# Patient Record
Sex: Female | Born: 1970 | Race: White | Hispanic: No | Marital: Married | Smoking: Former smoker
Health system: Southern US, Community
[De-identification: ages and names within clinical notes are randomized; demographics above are authoritative.]

## PROBLEM LIST (undated history)

## (undated) DIAGNOSIS — R000 Tachycardia, unspecified: Secondary | ICD-10-CM

## (undated) DIAGNOSIS — E559 Vitamin D deficiency, unspecified: Secondary | ICD-10-CM

## (undated) DIAGNOSIS — K429 Umbilical hernia without obstruction or gangrene: Secondary | ICD-10-CM

## (undated) DIAGNOSIS — I471 Supraventricular tachycardia, unspecified: Secondary | ICD-10-CM

## (undated) DIAGNOSIS — K3189 Other diseases of stomach and duodenum: Secondary | ICD-10-CM

## (undated) DIAGNOSIS — F329 Major depressive disorder, single episode, unspecified: Secondary | ICD-10-CM

## (undated) DIAGNOSIS — R7303 Prediabetes: Secondary | ICD-10-CM

## (undated) DIAGNOSIS — K219 Gastro-esophageal reflux disease without esophagitis: Secondary | ICD-10-CM

## (undated) DIAGNOSIS — R7302 Impaired glucose tolerance (oral): Secondary | ICD-10-CM

## (undated) DIAGNOSIS — J45909 Unspecified asthma, uncomplicated: Secondary | ICD-10-CM

## (undated) DIAGNOSIS — I10 Essential (primary) hypertension: Secondary | ICD-10-CM

## (undated) DIAGNOSIS — F3289 Other specified depressive episodes: Secondary | ICD-10-CM

## (undated) DIAGNOSIS — E538 Deficiency of other specified B group vitamins: Secondary | ICD-10-CM

## (undated) DIAGNOSIS — R0602 Shortness of breath: Secondary | ICD-10-CM

## (undated) DIAGNOSIS — E282 Polycystic ovarian syndrome: Secondary | ICD-10-CM

## (undated) DIAGNOSIS — G4733 Obstructive sleep apnea (adult) (pediatric): Secondary | ICD-10-CM

## (undated) DIAGNOSIS — E669 Obesity, unspecified: Secondary | ICD-10-CM

## (undated) DIAGNOSIS — R1013 Epigastric pain: Secondary | ICD-10-CM

## (undated) DIAGNOSIS — F17200 Nicotine dependence, unspecified, uncomplicated: Secondary | ICD-10-CM

## (undated) DIAGNOSIS — F419 Anxiety disorder, unspecified: Secondary | ICD-10-CM

## (undated) HISTORY — DX: Other diseases of stomach and duodenum: K31.89

## (undated) HISTORY — PX: DILATION AND CURETTAGE OF UTERUS: SHX78

## (undated) HISTORY — DX: Major depressive disorder, single episode, unspecified: F32.9

## (undated) HISTORY — DX: Unspecified asthma, uncomplicated: J45.909

## (undated) HISTORY — DX: Anxiety disorder, unspecified: F41.9

## (undated) HISTORY — DX: Obstructive sleep apnea (adult) (pediatric): G47.33

## (undated) HISTORY — DX: Obesity, unspecified: E66.9

## (undated) HISTORY — DX: Gastro-esophageal reflux disease without esophagitis: K21.9

## (undated) HISTORY — DX: Other specified depressive episodes: F32.89

## (undated) HISTORY — DX: Deficiency of other specified B group vitamins: E53.8

## (undated) HISTORY — DX: Prediabetes: R73.03

## (undated) HISTORY — DX: Impaired glucose tolerance (oral): R73.02

## (undated) HISTORY — DX: Epigastric pain: R10.13

## (undated) HISTORY — DX: Vitamin D deficiency, unspecified: E55.9

## (undated) HISTORY — DX: Nicotine dependence, unspecified, uncomplicated: F17.200

## (undated) HISTORY — DX: Essential (primary) hypertension: I10

## (undated) HISTORY — DX: Shortness of breath: R06.02

## (undated) HISTORY — DX: Polycystic ovarian syndrome: E28.2

## (undated) HISTORY — DX: Supraventricular tachycardia: I47.1

## (undated) HISTORY — DX: Umbilical hernia without obstruction or gangrene: K42.9

## (undated) HISTORY — PX: LAPAROSCOPY: SHX197

## (undated) HISTORY — DX: Supraventricular tachycardia, unspecified: I47.10

---

## 1993-04-11 HISTORY — PX: OTHER SURGICAL HISTORY: SHX169

## 1996-04-11 HISTORY — PX: TUBAL LIGATION: SHX77

## 1999-10-07 ENCOUNTER — Other Ambulatory Visit: Admission: RE | Admit: 1999-10-07 | Discharge: 1999-10-07 | Payer: Self-pay | Admitting: Family Medicine

## 2002-08-31 ENCOUNTER — Encounter: Payer: Self-pay | Admitting: Internal Medicine

## 2002-09-01 ENCOUNTER — Observation Stay (HOSPITAL_COMMUNITY): Admission: EM | Admit: 2002-09-01 | Discharge: 2002-09-01 | Payer: Self-pay | Admitting: Internal Medicine

## 2003-06-18 ENCOUNTER — Ambulatory Visit (HOSPITAL_COMMUNITY): Admission: RE | Admit: 2003-06-18 | Discharge: 2003-06-18 | Payer: Self-pay | Admitting: Unknown Physician Specialty

## 2003-12-19 ENCOUNTER — Ambulatory Visit (HOSPITAL_COMMUNITY): Admission: RE | Admit: 2003-12-19 | Discharge: 2003-12-19 | Payer: Self-pay | Admitting: Family Medicine

## 2004-03-11 ENCOUNTER — Ambulatory Visit: Payer: Self-pay | Admitting: Family Medicine

## 2004-03-24 ENCOUNTER — Ambulatory Visit: Payer: Self-pay | Admitting: Family Medicine

## 2004-04-28 ENCOUNTER — Encounter (INDEPENDENT_AMBULATORY_CARE_PROVIDER_SITE_OTHER): Payer: Self-pay | Admitting: *Deleted

## 2004-04-28 ENCOUNTER — Ambulatory Visit (HOSPITAL_COMMUNITY): Admission: RE | Admit: 2004-04-28 | Discharge: 2004-04-28 | Payer: Self-pay | Admitting: Gastroenterology

## 2004-04-28 ENCOUNTER — Encounter: Payer: Self-pay | Admitting: Internal Medicine

## 2004-05-11 ENCOUNTER — Ambulatory Visit: Payer: Self-pay | Admitting: Family Medicine

## 2004-07-08 ENCOUNTER — Ambulatory Visit: Payer: Self-pay | Admitting: Family Medicine

## 2004-07-19 ENCOUNTER — Encounter (INDEPENDENT_AMBULATORY_CARE_PROVIDER_SITE_OTHER): Payer: Self-pay | Admitting: *Deleted

## 2004-07-19 ENCOUNTER — Encounter: Payer: Self-pay | Admitting: Internal Medicine

## 2004-07-19 ENCOUNTER — Ambulatory Visit (HOSPITAL_COMMUNITY): Admission: RE | Admit: 2004-07-19 | Discharge: 2004-07-19 | Payer: Self-pay | Admitting: Gastroenterology

## 2004-09-24 ENCOUNTER — Encounter: Payer: Self-pay | Admitting: Internal Medicine

## 2004-09-24 ENCOUNTER — Encounter: Admission: RE | Admit: 2004-09-24 | Discharge: 2004-09-24 | Payer: Self-pay | Admitting: Gastroenterology

## 2004-09-24 ENCOUNTER — Encounter (INDEPENDENT_AMBULATORY_CARE_PROVIDER_SITE_OTHER): Payer: Self-pay | Admitting: *Deleted

## 2004-10-08 ENCOUNTER — Encounter: Payer: Self-pay | Admitting: Internal Medicine

## 2004-10-08 ENCOUNTER — Encounter: Admission: RE | Admit: 2004-10-08 | Discharge: 2004-10-08 | Payer: Self-pay | Admitting: Gastroenterology

## 2004-10-08 ENCOUNTER — Encounter (INDEPENDENT_AMBULATORY_CARE_PROVIDER_SITE_OTHER): Payer: Self-pay | Admitting: *Deleted

## 2004-12-08 ENCOUNTER — Ambulatory Visit: Payer: Self-pay | Admitting: Family Medicine

## 2004-12-08 ENCOUNTER — Ambulatory Visit (HOSPITAL_COMMUNITY): Admission: RE | Admit: 2004-12-08 | Discharge: 2004-12-08 | Payer: Self-pay | Admitting: Family Medicine

## 2004-12-16 ENCOUNTER — Other Ambulatory Visit: Admission: RE | Admit: 2004-12-16 | Discharge: 2004-12-16 | Payer: Self-pay | Admitting: Obstetrics and Gynecology

## 2005-05-25 ENCOUNTER — Ambulatory Visit: Payer: Self-pay | Admitting: Urgent Care

## 2005-06-02 ENCOUNTER — Ambulatory Visit (HOSPITAL_COMMUNITY): Admission: RE | Admit: 2005-06-02 | Discharge: 2005-06-02 | Payer: Self-pay | Admitting: Internal Medicine

## 2005-06-13 ENCOUNTER — Encounter (INDEPENDENT_AMBULATORY_CARE_PROVIDER_SITE_OTHER): Payer: Self-pay | Admitting: *Deleted

## 2005-06-13 ENCOUNTER — Encounter (HOSPITAL_COMMUNITY): Admission: RE | Admit: 2005-06-13 | Discharge: 2005-07-13 | Payer: Self-pay | Admitting: Internal Medicine

## 2005-06-30 ENCOUNTER — Ambulatory Visit: Payer: Self-pay | Admitting: Internal Medicine

## 2005-07-12 ENCOUNTER — Ambulatory Visit (HOSPITAL_BASED_OUTPATIENT_CLINIC_OR_DEPARTMENT_OTHER): Admission: RE | Admit: 2005-07-12 | Discharge: 2005-07-12 | Payer: Self-pay | Admitting: Obstetrics and Gynecology

## 2005-07-17 ENCOUNTER — Ambulatory Visit: Payer: Self-pay | Admitting: Internal Medicine

## 2005-10-11 ENCOUNTER — Emergency Department (HOSPITAL_COMMUNITY): Admission: EM | Admit: 2005-10-11 | Discharge: 2005-10-11 | Payer: Self-pay | Admitting: Emergency Medicine

## 2006-02-09 ENCOUNTER — Ambulatory Visit (HOSPITAL_COMMUNITY): Admission: RE | Admit: 2006-02-09 | Discharge: 2006-02-09 | Payer: Self-pay | Admitting: Obstetrics and Gynecology

## 2006-02-24 ENCOUNTER — Ambulatory Visit: Payer: Self-pay | Admitting: Pulmonary Disease

## 2006-03-14 ENCOUNTER — Ambulatory Visit: Payer: Self-pay | Admitting: Pulmonary Disease

## 2006-04-07 ENCOUNTER — Ambulatory Visit: Payer: Self-pay | Admitting: Pulmonary Disease

## 2006-04-21 ENCOUNTER — Ambulatory Visit: Payer: Self-pay | Admitting: Pulmonary Disease

## 2006-04-21 ENCOUNTER — Ambulatory Visit (HOSPITAL_BASED_OUTPATIENT_CLINIC_OR_DEPARTMENT_OTHER): Admission: RE | Admit: 2006-04-21 | Discharge: 2006-04-21 | Payer: Self-pay | Admitting: Pulmonary Disease

## 2006-06-26 ENCOUNTER — Ambulatory Visit: Payer: Self-pay | Admitting: Pulmonary Disease

## 2006-06-29 ENCOUNTER — Ambulatory Visit: Payer: Self-pay | Admitting: Cardiology

## 2006-07-20 ENCOUNTER — Ambulatory Visit: Payer: Self-pay

## 2006-07-25 ENCOUNTER — Ambulatory Visit: Payer: Self-pay

## 2006-07-25 ENCOUNTER — Encounter: Payer: Self-pay | Admitting: Cardiovascular Disease

## 2007-02-10 DIAGNOSIS — Z6841 Body Mass Index (BMI) 40.0 and over, adult: Secondary | ICD-10-CM

## 2007-02-10 DIAGNOSIS — J45991 Cough variant asthma: Secondary | ICD-10-CM | POA: Insufficient documentation

## 2007-02-10 DIAGNOSIS — E282 Polycystic ovarian syndrome: Secondary | ICD-10-CM | POA: Insufficient documentation

## 2007-02-10 DIAGNOSIS — K30 Functional dyspepsia: Secondary | ICD-10-CM | POA: Insufficient documentation

## 2007-02-10 DIAGNOSIS — G4733 Obstructive sleep apnea (adult) (pediatric): Secondary | ICD-10-CM | POA: Insufficient documentation

## 2007-06-20 ENCOUNTER — Encounter (INDEPENDENT_AMBULATORY_CARE_PROVIDER_SITE_OTHER): Payer: Self-pay | Admitting: Obstetrics and Gynecology

## 2007-06-20 ENCOUNTER — Ambulatory Visit (HOSPITAL_COMMUNITY): Admission: RE | Admit: 2007-06-20 | Discharge: 2007-06-20 | Payer: Self-pay | Admitting: Obstetrics and Gynecology

## 2007-07-19 ENCOUNTER — Ambulatory Visit (HOSPITAL_COMMUNITY): Admission: RE | Admit: 2007-07-19 | Discharge: 2007-07-19 | Payer: Self-pay | Admitting: Obstetrics and Gynecology

## 2007-07-19 ENCOUNTER — Encounter (INDEPENDENT_AMBULATORY_CARE_PROVIDER_SITE_OTHER): Payer: Self-pay | Admitting: *Deleted

## 2007-11-08 ENCOUNTER — Emergency Department (HOSPITAL_COMMUNITY): Admission: EM | Admit: 2007-11-08 | Discharge: 2007-11-08 | Payer: Self-pay | Admitting: Emergency Medicine

## 2007-11-15 ENCOUNTER — Emergency Department (HOSPITAL_COMMUNITY): Admission: EM | Admit: 2007-11-15 | Discharge: 2007-11-15 | Payer: Self-pay | Admitting: Emergency Medicine

## 2008-01-04 ENCOUNTER — Emergency Department (HOSPITAL_COMMUNITY): Admission: EM | Admit: 2008-01-04 | Discharge: 2008-01-04 | Payer: Self-pay | Admitting: Emergency Medicine

## 2008-03-04 ENCOUNTER — Ambulatory Visit: Payer: Self-pay | Admitting: Internal Medicine

## 2008-03-11 ENCOUNTER — Ambulatory Visit (HOSPITAL_COMMUNITY): Admission: RE | Admit: 2008-03-11 | Discharge: 2008-03-11 | Payer: Self-pay | Admitting: Internal Medicine

## 2008-04-11 ENCOUNTER — Encounter: Payer: Self-pay | Admitting: Internal Medicine

## 2008-04-11 LAB — CONVERTED CEMR LAB: Pap Smear: NORMAL

## 2008-04-23 ENCOUNTER — Telehealth: Payer: Self-pay | Admitting: Pulmonary Disease

## 2008-05-01 ENCOUNTER — Ambulatory Visit: Payer: Self-pay | Admitting: Cardiology

## 2008-05-01 LAB — CONVERTED CEMR LAB
BUN: 12 mg/dL (ref 6–23)
Basophils Absolute: 0.1 10*3/uL (ref 0.0–0.1)
Basophils Relative: 0.9 % (ref 0.0–3.0)
CO2: 29 meq/L (ref 19–32)
Calcium: 9.2 mg/dL (ref 8.4–10.5)
Chloride: 105 meq/L (ref 96–112)
Creatinine, Ser: 0.8 mg/dL (ref 0.4–1.2)
Eosinophils Absolute: 0.1 10*3/uL (ref 0.0–0.7)
Eosinophils Relative: 1.4 % (ref 0.0–5.0)
GFR calc Af Amer: 104 mL/min
GFR calc non Af Amer: 86 mL/min
Glucose, Bld: 79 mg/dL (ref 70–99)
HCT: 37.6 % (ref 36.0–46.0)
Hemoglobin: 12.5 g/dL (ref 12.0–15.0)
Lymphocytes Relative: 29.4 % (ref 12.0–46.0)
MCHC: 33.4 g/dL (ref 30.0–36.0)
MCV: 90.5 fL (ref 78.0–100.0)
Magnesium: 1.8 mg/dL (ref 1.5–2.5)
Monocytes Absolute: 0.4 10*3/uL (ref 0.1–1.0)
Monocytes Relative: 4.4 % (ref 3.0–12.0)
Neutro Abs: 5.9 10*3/uL (ref 1.4–7.7)
Neutrophils Relative %: 63.9 % (ref 43.0–77.0)
Platelets: 304 10*3/uL (ref 150–400)
Potassium: 4 meq/L (ref 3.5–5.1)
RBC: 4.15 M/uL (ref 3.87–5.11)
RDW: 12.4 % (ref 11.5–14.6)
Sodium: 139 meq/L (ref 135–145)
TSH: 2.91 microintl units/mL (ref 0.35–5.50)
WBC: 9.2 10*3/uL (ref 4.5–10.5)

## 2008-05-07 ENCOUNTER — Encounter: Payer: Self-pay | Admitting: Cardiology

## 2008-05-07 ENCOUNTER — Ambulatory Visit: Payer: Self-pay

## 2008-05-19 ENCOUNTER — Encounter: Payer: Self-pay | Admitting: Internal Medicine

## 2008-05-21 ENCOUNTER — Telehealth: Payer: Self-pay | Admitting: Internal Medicine

## 2008-05-29 ENCOUNTER — Ambulatory Visit: Payer: Self-pay | Admitting: Cardiology

## 2008-06-05 ENCOUNTER — Ambulatory Visit: Payer: Self-pay | Admitting: Internal Medicine

## 2008-06-06 LAB — CONVERTED CEMR LAB
ALT: 14 units/L (ref 0–35)
AST: 10 units/L (ref 0–37)
Albumin: 3.9 g/dL (ref 3.5–5.2)
Alkaline Phosphatase: 85 units/L (ref 39–117)
BUN: 12 mg/dL (ref 6–23)
Basophils Absolute: 0.1 10*3/uL (ref 0.0–0.1)
Basophils Relative: 1.3 % (ref 0.0–3.0)
CO2: 29 meq/L (ref 19–32)
Calcium: 9 mg/dL (ref 8.4–10.5)
Chloride: 104 meq/L (ref 96–112)
Creatinine, Ser: 0.8 mg/dL (ref 0.4–1.2)
Eosinophils Absolute: 0.2 10*3/uL (ref 0.0–0.7)
Eosinophils Relative: 1.6 % (ref 0.0–5.0)
GFR calc Af Amer: 104 mL/min
GFR calc non Af Amer: 86 mL/min
Glucose, Bld: 93 mg/dL (ref 70–99)
HCT: 37.7 % (ref 36.0–46.0)
Hemoglobin: 12.9 g/dL (ref 12.0–15.0)
Lymphocytes Relative: 32 % (ref 12.0–46.0)
MCHC: 34.3 g/dL (ref 30.0–36.0)
MCV: 89.3 fL (ref 78.0–100.0)
Monocytes Absolute: 0.4 10*3/uL (ref 0.1–1.0)
Monocytes Relative: 4 % (ref 3.0–12.0)
Neutro Abs: 6.2 10*3/uL (ref 1.4–7.7)
Neutrophils Relative %: 61.1 % (ref 43.0–77.0)
Platelets: 321 10*3/uL (ref 150–400)
Potassium: 3.9 meq/L (ref 3.5–5.1)
RBC: 4.22 M/uL (ref 3.87–5.11)
RDW: 12.2 % (ref 11.5–14.6)
Sed Rate: 32 mm/hr — ABNORMAL HIGH (ref 0–22)
Sodium: 139 meq/L (ref 135–145)
TSH: 2.37 microintl units/mL (ref 0.35–5.50)
Total Bilirubin: 0.7 mg/dL (ref 0.3–1.2)
Total Protein: 7.3 g/dL (ref 6.0–8.3)
WBC: 10.2 10*3/uL (ref 4.5–10.5)

## 2008-06-23 ENCOUNTER — Encounter: Payer: Self-pay | Admitting: Pulmonary Disease

## 2008-06-23 ENCOUNTER — Telehealth: Payer: Self-pay | Admitting: Internal Medicine

## 2008-07-24 ENCOUNTER — Ambulatory Visit: Payer: Self-pay | Admitting: Internal Medicine

## 2008-07-24 ENCOUNTER — Encounter: Payer: Self-pay | Admitting: Internal Medicine

## 2008-07-24 LAB — HM COLONOSCOPY

## 2008-07-26 ENCOUNTER — Encounter: Payer: Self-pay | Admitting: Internal Medicine

## 2008-09-19 ENCOUNTER — Encounter (INDEPENDENT_AMBULATORY_CARE_PROVIDER_SITE_OTHER): Payer: Self-pay | Admitting: *Deleted

## 2008-10-15 ENCOUNTER — Telehealth: Payer: Self-pay | Admitting: Internal Medicine

## 2008-12-05 ENCOUNTER — Ambulatory Visit: Payer: Self-pay | Admitting: Internal Medicine

## 2009-01-28 ENCOUNTER — Ambulatory Visit: Payer: Self-pay | Admitting: Internal Medicine

## 2009-01-28 ENCOUNTER — Encounter: Payer: Self-pay | Admitting: Internal Medicine

## 2009-01-28 DIAGNOSIS — F329 Major depressive disorder, single episode, unspecified: Secondary | ICD-10-CM | POA: Insufficient documentation

## 2009-01-28 DIAGNOSIS — K219 Gastro-esophageal reflux disease without esophagitis: Secondary | ICD-10-CM | POA: Insufficient documentation

## 2009-01-28 DIAGNOSIS — F17210 Nicotine dependence, cigarettes, uncomplicated: Secondary | ICD-10-CM | POA: Insufficient documentation

## 2009-01-28 DIAGNOSIS — F32A Depression, unspecified: Secondary | ICD-10-CM | POA: Insufficient documentation

## 2009-01-28 DIAGNOSIS — B009 Herpesviral infection, unspecified: Secondary | ICD-10-CM | POA: Insufficient documentation

## 2009-05-23 ENCOUNTER — Emergency Department (HOSPITAL_COMMUNITY): Admission: EM | Admit: 2009-05-23 | Discharge: 2009-05-24 | Payer: Self-pay | Admitting: Emergency Medicine

## 2009-05-24 ENCOUNTER — Encounter: Payer: Self-pay | Admitting: Internal Medicine

## 2009-05-25 ENCOUNTER — Ambulatory Visit: Payer: Self-pay | Admitting: Internal Medicine

## 2009-05-25 ENCOUNTER — Telehealth: Payer: Self-pay | Admitting: Internal Medicine

## 2009-05-25 DIAGNOSIS — J069 Acute upper respiratory infection, unspecified: Secondary | ICD-10-CM | POA: Insufficient documentation

## 2009-08-04 ENCOUNTER — Ambulatory Visit: Payer: Self-pay | Admitting: Internal Medicine

## 2009-08-04 LAB — CONVERTED CEMR LAB
ALT: 17 units/L (ref 0–35)
AST: 12 units/L (ref 0–37)
Albumin: 3.7 g/dL (ref 3.5–5.2)
Alkaline Phosphatase: 72 units/L (ref 39–117)
BUN: 11 mg/dL (ref 6–23)
Basophils Absolute: 0 10*3/uL (ref 0.0–0.1)
Basophils Relative: 0.5 % (ref 0.0–3.0)
Bilirubin Urine: NEGATIVE
Bilirubin, Direct: 0.1 mg/dL (ref 0.0–0.3)
CO2: 27 meq/L (ref 19–32)
Calcium: 8.7 mg/dL (ref 8.4–10.5)
Chloride: 109 meq/L (ref 96–112)
Cholesterol: 215 mg/dL — ABNORMAL HIGH (ref 0–200)
Creatinine, Ser: 0.8 mg/dL (ref 0.4–1.2)
Direct LDL: 161.1 mg/dL
Eosinophils Absolute: 0.1 10*3/uL (ref 0.0–0.7)
Eosinophils Relative: 1.2 % (ref 0.0–5.0)
GFR calc non Af Amer: 85.51 mL/min (ref >60)
Glucose, Bld: 80 mg/dL (ref 70–99)
HCT: 35 % — ABNORMAL LOW (ref 36.0–46.0)
HDL: 41.8 mg/dL (ref >39.00)
Hemoglobin, Urine: NEGATIVE
Hemoglobin: 11.6 g/dL — ABNORMAL LOW (ref 12.0–15.0)
Ketones, ur: NEGATIVE mg/dL
Leukocytes, UA: NEGATIVE
Lymphocytes Relative: 30.7 % (ref 12.0–46.0)
Lymphs Abs: 2.6 10*3/uL (ref 0.7–4.0)
MCHC: 33.2 g/dL (ref 30.0–36.0)
MCV: 91.8 fL (ref 78.0–100.0)
Monocytes Absolute: 0.4 10*3/uL (ref 0.1–1.0)
Monocytes Relative: 5 % (ref 3.0–12.0)
Neutro Abs: 5.4 10*3/uL (ref 1.4–7.7)
Neutrophils Relative %: 62.6 % (ref 43.0–77.0)
Nitrite: NEGATIVE
Platelets: 276 10*3/uL (ref 150.0–400.0)
Potassium: 4.3 meq/L (ref 3.5–5.1)
RBC: 3.82 M/uL — ABNORMAL LOW (ref 3.87–5.11)
RDW: 13.6 % (ref 11.5–14.6)
Sodium: 141 meq/L (ref 135–145)
Specific Gravity, Urine: 1.025 (ref 1.000–1.030)
TSH: 2.27 microintl units/mL (ref 0.35–5.50)
Total Bilirubin: 0.5 mg/dL (ref 0.3–1.2)
Total CHOL/HDL Ratio: 5
Total Protein, Urine: NEGATIVE mg/dL
Total Protein: 6.4 g/dL (ref 6.0–8.3)
Triglycerides: 85 mg/dL (ref 0.0–149.0)
Urine Glucose: NEGATIVE mg/dL
Urobilinogen, UA: 0.2 (ref 0.0–1.0)
VLDL: 17 mg/dL (ref 0.0–40.0)
WBC: 8.6 10*3/uL (ref 4.5–10.5)
pH: 7 (ref 5.0–8.0)

## 2009-08-06 ENCOUNTER — Ambulatory Visit: Payer: Self-pay | Admitting: Internal Medicine

## 2009-09-10 ENCOUNTER — Telehealth: Payer: Self-pay | Admitting: Internal Medicine

## 2009-09-30 ENCOUNTER — Encounter: Payer: Self-pay | Admitting: Internal Medicine

## 2009-12-16 ENCOUNTER — Ambulatory Visit: Payer: Self-pay | Admitting: Internal Medicine

## 2009-12-25 ENCOUNTER — Encounter: Payer: Self-pay | Admitting: Internal Medicine

## 2009-12-25 LAB — HM MAMMOGRAPHY

## 2010-01-20 ENCOUNTER — Ambulatory Visit: Payer: Self-pay | Admitting: Internal Medicine

## 2010-01-20 DIAGNOSIS — H9209 Otalgia, unspecified ear: Secondary | ICD-10-CM | POA: Insufficient documentation

## 2010-01-27 ENCOUNTER — Ambulatory Visit: Payer: Self-pay | Admitting: Internal Medicine

## 2010-04-21 ENCOUNTER — Ambulatory Visit: Admit: 2010-04-21 | Payer: Self-pay | Admitting: Internal Medicine

## 2010-05-13 NOTE — Procedures (Signed)
Summary: 530.81, 787.91/dn

## 2010-05-13 NOTE — Op Note (Signed)
Summary: EGD  NAME:  [name], [name]                  ACCOUNT NO.:  [id number]   MEDICAL RECORD NO.:  [id number]          PATIENT TYPE:  AMB   LOCATION:  ENDO                         FACILITY:  [facility]   PHYSICIAN:  Graylin Shiver, M.D.   DATE OF BIRTH:  April 13, 1970   DATE OF PROCEDURE:  DATE OF DISCHARGE:                                 OPERATIVE REPORT   PROCEDURE:  Esophagogastroduodenoscopy with biopsy for CLO-test.   INDICATION:  Upper abdominal pain etiology unclear. The patient does have an  abnormal gallbladder ejection fraction.  Upper endoscopy is being done to  determine if there is any abnormality in the upper GI tract which might  explain her abdominal pain and associated nausea and vomiting.   Informed consent was obtained after explanation of the risks of bleeding,  infection and perforation.   PREMEDICATION:  Fentanyl 80 mcg IV, Versed 10 mg IV.   PROCEDURE:  With the patient in the left lateral decubitus position the  Olympus gastroscope was inserted into the oropharynx and passed into the  esophagus it was advanced down the esophagus, then into the stomach, and  into the duodenum. The second portion of the duodenum looked normal. The  bulb of the duodenum was erythematous compatible with duodenitis.  The  stomach showed a diffuse erythematous appearance to the mucosa compatible  with gastritis. Biopsy for CLO-test was obtained. No lesions were seen in  the fundus or cardia on retroflexion.  The esophagus looked normal. The  esophagogastric junction was at 38 cm. She tolerated the procedure well  without complications.   IMPRESSION:  1.  Gastritis and duodenitis.  2.  Biopsy for CLO-test was obtained.   PLAN:  1.  Check the CLO-test to look for evidence of Helicobacter pylori.  2.  Continue Prevacid 30 mg daily, to see if this helps.  3.  The patient was given a prescription for Bentyl 20 mg p.o. 1/2 hour      before meals t.i.d. to see if this helps.  4.  I will  follow her up in the office in a month to see how she is doing      clinically       SFG/MEDQ  D:  04/28/2004  T:  04/28/2004  Job:  [id number]   cc:   Lorne Skeens. Hoxworth, M.D.  [address], [address]  [place]  [place] [address]  Fax: [phone]   Delaney Meigs, M.D.  [address]  [place]  [place] [address]  Fax: [phone]

## 2010-05-13 NOTE — Procedures (Signed)
Summary: Colonoscopy   Colonoscopy  Procedure date:  07/24/2008  Findings:      Location:  [facility].    COLONOSCOPY PROCEDURE REPORT  PATIENT:  [name], [name]  MR#:  [id number] BIRTHDATE:   1970-09-02, 37 yrs. old   GENDER:   female  ENDOSCOPIST:   Hedwig Morton. Juanda Chance, MD Referred by: Carrington Clamp, M.D.  PROCEDURE DATE:  07/24/2008 PROCEDURE:  Colonoscopy with biopsy ASA CLASS:   Class II INDICATIONS: change in bowel habits, unexplained diarrhea   MEDICATIONS:    Versed 5 mg, Fentanyl 25 mcg  DESCRIPTION OF PROCEDURE:   After the risks benefits and alternatives of the procedure were thoroughly explained, informed consent was obtained.  Digital rectal exam was performed and revealed no rectal masses.   The LB [id number] endoscope was introduced through the anus and advanced to the cecum, which was identified by both the appendix and ileocecal valve, without limitations.  The quality of the prep was good, using MiraLax.  The instrument was then slowly withdrawn as the colon was fully examined. <<PROCEDUREIMAGES>>      <<OLD IMAGES>>  FINDINGS:  Mild diverticulosis was found sigmoid to descending  This was otherwise a normal examination of the colon. With standard forceps, biopsy was obtained and sent to pathology (see image4, image3, image2, and image1).   Retroflexed views in the rectum revealed no abnormalities.    The scope was then withdrawn from the patient and the procedure completed.  COMPLICATIONS:   None  ENDOSCOPIC IMPRESSION:  1) Mild diverticulosis in the sigmoid to descending  2) Otherwise normal examination  s/p random biopsies of the colon to r/o microscopic colitis RECOMMENDATIONS:  1) hemoccults q 2-3 years  2) await biopsy results  3) high fiber diet  REPEAT EXAM:   In 0 year(s) for.   _______________________________ Hedwig Morton. Juanda Chance, MD  CC: Carrington Clamp, MD    REPORT OF SURGICAL PATHOLOGY   Case #: [phone] Patient  Name: [name], [name]. Office Chart Number:  [id number]   MRN: [id number] Pathologist: Beulah Gandy. Luisa Hart, MD DOB/Age  [birth year] (Age: 68)    Gender: F Date Taken:  07/24/2008 Date Received: 07/24/2008   FINAL DIAGNOSIS   ***MICROSCOPIC EXAMINATION AND DIAGNOSIS***   1.  GASTROESOPHAGEAL JUNCTION, BIOPSIES:  BENIGN GASTROESOPHAGEAL JUNCTION MUCOSA.  NO INTESTINAL METAPLASIA, DYSPLASIA OR MALIGNANCY IDENTIFIED.    2.  COLON, RANDOM BIOPSIES:  BENIGN COLONIC MUCOSA.  NO ACTIVE INFLAMMATION, MICROSCOPIC COLITIS OR GRANULOMAS.    COMMENT 1.  An Alcian Blue stain is performed to determine the presence of intestinal metaplasia (goblet cell metaplasia). No intestinal metaplasia (goblet cell metaplasia) is identified with the Alcian Blue stain. The control stained appropriately.   2.  There is colorectal mucosa with normal crypt architecture and no objective increase in inflammation.  No active inflammation, microscopic colitis, collagenous colitis or significant chronic changes identified. No hyperplastic or adenomatous changes are seen, and there is no evidence of malignancy. (JDP:mj 07/25/08)   mj Date Reported:  07/25/2008     Beulah Gandy. Luisa Hart, MD *** Electronically Signed Out By JDP ***     July 26, 2008 MRN: [id number]    [name] [address] [place], [place]  [address]    Dear [name],  I am pleased to inform you that the biopsies taken during your recent colonoscopy did not show any evidence of cancer upon pathologic examination.The biopsies from Your stomach as well as from Your colon show normal tissue, No evidence of colitis or  ulcer.  Additional information/recommendations:  __No further action is needed at this time.  Please follow-up with      your primary care physician for your other healthcare needs.  __Please call [phone] to schedule a return visit to review      your condition.  _x_Continue with the treatment plan as outlined on the day of your       exam.    Please call us if you are having persistent problems or have questions about your condition that have not been fully answered at this time.  Sincerely,  [name]   This letter has been electronically signed by your physician.  This report was created from the original endoscopy report, which was reviewed and signed by the above listed endoscopist.

## 2010-05-13 NOTE — Letter (Signed)
Summary: No Show/Nutri-Dbs-Mgmt  No Show/Nutri-Dbs-Mgmt   Imported By: Lester Craighead 10/02/2009 08:18:24  _____________________________________________________________________  External Attachment:    Type:   Image     Comment:   External Document

## 2010-05-13 NOTE — Progress Notes (Signed)
Summary: Xanax refill  Phone Note Call from Patient Call back at Work Phone [phone]   Call For: Dr Juanda Chance Reason for Call: Refill Medication Summary of Call: [facility] and she is wondering if the DR is going to ok it. [facility] [place]. Its for [facility]ax  Initial call taken by: Leanor Kail [facility],  May 21, 2008 11:53 AM  Follow-up for Phone Call        Patient advised that we sent xanax rx on 05/19/08 to patients pharmacy.  After calling pharmacy, pharmacist stated that the rx has already been filled and is waiting for patient to pick up.  Patient has been advised that we will give no further refills of xanax and that she should get future xanax rx from primary care doctor.  Patient says she has no primary care dr and would like to know who to go to.  I have recommended Dr Yetta Barre at [facility]. Follow-up by: Hortense Ramal CMA,  May 21, 2008 12:10 PM

## 2010-05-13 NOTE — Assessment & Plan Note (Signed)
Summary: NAUSEA/PN/FH   History of Present Illness Visit Type: consult Primary GI MD: Lina Sar MD Requesting Khaliel Morey: Carrington Clamp, MD Chief Complaint: Abdominal pain, Nausea/Vomiting, Chest Pain History of Present Illness:   This is a 40 year old white female with chronic digestive problems and multiple symptoms pertaining to her chest and abdomen. Her symptoms started in 09-10-96 when her first husband died of cancer and she has was left with 2  infant children. She since then has remarried and is quite happy but is having a lot of stress. She had a GI evaluation by Dr. Evette Cristal in 2004/09/10 she had an abnormal HIDA scan at [facility] in 09-11-2002 as well as an upper endoscopy in 2004/09/10 which showed mild gastritis and duodenitis. She was on Prevacid 30 mg a day at that time but has discontinued it because of the cost. She has been overweight. Her sister was diagnosed with gallstones but has not had her gallbladder removed. Her other medical problems include obesity, polycystic ovary  treated by Dr. 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 Her upper abdominal ultrasound in April 2009 was normal. She complains of morning nausea and vomiting.She has an upcoming court appearance which makes her anxious.   GI Review of Systems    Reports abdominal pain, acid reflux, chest pain, loss of appetite, nausea, vomiting, and  weight loss.     Location of  Abdominal pain: generalized. Weight loss of 11 pounds over 6 weeks.   Denies belching, bloating, dysphagia with liquids, dysphagia with solids, heartburn, vomiting blood, and  weight gain.      Reports diverticulosis.     Denies anal fissure, black tarry stools, change in bowel habit, constipation, diarrhea, fecal incontinence, heme positive stool, hemorrhoids, irritable bowel syndrome, jaundice, light color stool, liver problems, rectal bleeding, and  rectal pain.     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)       OBSTRUCTIVE SLEEP APNEA (ICD-327.23)       ASTHMA (ICD-493.90)       Diverticulosis       Ulcerative Colitis       Urinary Tract Infection  Past Surgical History:    Reviewed history from 03/03/2008 and no changes required:       c-section x 2       tubal ligation   Family History:    Reviewed history from 03/03/2008 and no changes required:       Family History of Heart Disease: Maternal Grandmother       Family History of Pancreatic Cancer:Maternal Grandmother (Diagnosed age 33)       Family History of Prostate Cancer: Maternal Grandfather (diagnosed in 74's)       Family History of Colon Polyps: Maternal Grandfather       Family History of Diabetes: Maternal Aunt  Social History:    Reviewed history from 03/03/2008 and  no changes required:       Alcohol Use - yes-rare       Illicit Drug Use - no       Daily Caffeine Use       Patient currently smokes.    Risk Factors:  Tobacco use:  current   Review of Systems       The patient complains of back pain, cough, fatigue, fever, sore throat, and swollen lymph glands.         Pertinent positive and negative review of systems were noted in the above HPI. All other ROS was otherwise negative.    Vital Signs:  Patient Profile:   40 Years Old Female Height:     65 inches Weight:      276 pounds BMI:     46.09 BSA:     2.27 Pulse rate:   76 / minute Pulse rhythm:   regular BP sitting:   108 / 80  (left  arm)  Vitals Entered By: Teresita Madura CMA (March 04, 2008 9:52 AM)                  Physical Exam  General:     obese, alert and oriented. teary-eyed at times. Neck:     Supple; no masses or thyromegaly. Lungs:     Clear throughout to auscultation. Heart:     Regular rate and rhythm; no murmurs, rubs,  or bruits. Abdomen:     obese abdomen. diffusely tender; more so in the right lower and middle quadrant as well as epigastrium. Normal active bowel sounds. Liver edge at costal margin. I could not appreciate any specific tenderness over the gallbladder area. Rectal:     normal rectal tone. small amount of hemoccult negative stool.    Impression & Recommendations:  Problem # 1:  NAUSEA (ICD-787.02) morning nausea. Patient is under a lot of stress. She also has a history of gastroesophageal reflux. A combination of these 2 problems could be causing her morning vomiting. We will put her on Prilosec 20 mg at bedtime to reduce the overnight acid production. At the same time,  we will put her on Xanax 0.25 mg 1/2-1 tablet p.r.n. anxiety. She will have to appear in court within the next few weeks and is quite anxious about it. We will also proceed with a repeat HIDA scan since the first one was abnormal and since her sister has gallstones. I will continue to work with her because some of her problems may be functional   related to stress and life circumstances. Orders: HIDA CCK (HIDA CCK)   Problem # 2:  OBESITY (ICD-278.00) We discussed the necessity to reduce her weight by modifying her diet.  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 At this point, I am concerned Reglan could cause more CNS side effects so we will hold it. Orders: HIDA CCK (HIDA CCK)    Patient Instructions: 1)  HIDA scan with CCK 2)  Prilosec 20 mg p.o. q.h.s. 3)  Xanax 0.25 mg 1/2-1 tablet p.o. p.r.n. 4)  Return in 4 weeks 5)  Copy Sent To:Dr  C.Senaida Ores    Prescriptions: ALPRAZOLAM 0.25 MG TABS (ALPRAZOLAM) Take 1/2-1 tablet by mouth daily as needed  #30 x 0   Entered by:   Hortense Ramal CMA   Authorized by:   [name] MD   Signed by:   Hortense Ramal CMA on 03/04/2008   Method used:  [facility]       [facility]  [address] [phone][facility] ([facility]ail[facility]       [address]       [place], [place]  [address]       Ph: [phone] or [phone]       Fax: [phone]   RxID:   [id number] PRILOSEC 20 MG CPDR (OMEPRAZOLE) Take 1 tablet by mouth at bedtime  #30 x 3   Entered by:   Hortense Ramal CMA   Authorized by:   [name] MD   Signed by:   Hortense Ramal CMA on 03/04/2008   Method used:   [facility]        [facility] [phone]* (retail)       [address]       [place], [place]  [address]       Ph: [phone] or [phone]       Fax: [phone]   RxID:   [id number]  ]

## 2010-05-13 NOTE — Progress Notes (Signed)
  Phone Note From Other Clinic   Summary of Call: I received a fax request from [facility] to renew [name]'s CPAP equipment.  She has not been seen in pulmonary office since 03/08.  I will need to have the patient return for an ROV to assess her status before filling this form.  Alternatively she could have her primary physician complete this form.  I will have my nurse contact the patient to see if [name] would like to have an ROV with me, or follow up with her primary physician to further treat her sleep apnea.  I will also have my nurse contact [facility] about the status of this form.      Appended Document:  Spoke with [name]. - she does not have primary so would like to see Dr. Craige Cotta -  Will speak with Dr. Craige Cotta about appt availability.

## 2010-05-13 NOTE — Assessment & Plan Note (Signed)
Summary: New patient   Vital Signs:  Patient profile:   40 year old female Height:      65 inches (165.10 cm) Weight:      282.2 pounds (128.27 kg) O2 Sat:      97 % Temp:     98.8 degrees F (37.11 degrees C) oral Pulse rate:   81 / minute BP sitting:   120 / 82  (left arm) Cuff size:   large  Vitals Entered By: Orlan Leavens (January 28, 2009 8:25 AM) CC: New patient/ Will need new rx for xanax Is Patient Diabetic? No Pain Assessment Patient in pain? no        Primary Care Provider:  Newt Lukes MD  CC:  New patient/ Will need new rx for xanax.  History of Present Illness: new pt to me and our division - here to est care  anxiety/depression takes paxil for same - started by GI 05/2008 -  years ago on zoloft for same following death of 1st husband 09/02/96) also uses low dose xanax sparingly (was provided once by GI 05/2008 - many pills remain) primary symptoms are "worring" thoughts and "excessive fear about little things" has been to pysc MD but never "counseling"  GERD - follows with dr. Juanda Chance for same symptoms reasonably well controlled  OSA - uses CPAP at night (most nights) dr. Craige Cotta with pulm dx - but has not been seen in few years ?if she still needs to use it as reports sleep study 2 years ago was not as bad as initial sleep study   PCOS - gyn = [facility] follws annual for PAP and labs but will start having labs and medical physical here  cold sores -  occurs at least once monthly prev on famvir but stopped due to ineffective suppression of outbreak  (took with onset of symptoms , not daily) +current outbreak - ?if otheer meds to try (usuing OTC meds now -topical)  Preventive Screening-Counseling & Management  Alcohol-Tobacco     Alcohol drinks/day: <1     Alcohol Counseling: not indicated; use of alcohol is not excessive or problematic     Smoking Status: current     Tobacco Counseling: to quit use of tobacco products  Caffeine-Diet-Exercise  Diet Counseling: to improve diet; diet is suboptimal     Does Patient Exercise: no     Exercise Counseling: to improve exercise regimen  Clinical Review Panels:  Prevention   Last Pap Smear:  normal (04/11/2008)   Last Colonoscopy:  Location:  [facility].  (07/24/2008)  Immunizations   Last Tetanus Booster:  Historical (04/12/2007)  CBC   WBC:  10.2 (06/05/2008)   RBC:  4.22 (06/05/2008)   Hgb:  12.9 (06/05/2008)   Hct:  37.7 (06/05/2008)   Platelets:  321 (06/05/2008)   MCV  89.3 (06/05/2008)   MCHC  34.3 (06/05/2008)   RDW  12.2 (06/05/2008)   PMN:  61.1 (06/05/2008)   Lymphs:  32.0 (06/05/2008)   Monos:  4.0 (06/05/2008)   Eosinophils:  1.6 (06/05/2008)   Basophil:  1.3 (06/05/2008)  Complete Metabolic Panel   Glucose:  93 (06/05/2008)   Sodium:  139 (06/05/2008)   Potassium:  3.9 (06/05/2008)   Chloride:  104 (06/05/2008)   CO2:  29 (06/05/2008)   BUN:  12 (06/05/2008)   Creatinine:  0.8 (06/05/2008)   Albumin:  3.9 (06/05/2008)   Total Protein:  7.3 (06/05/2008)   Calcium:  9.0 (06/05/2008)   Total Bili:  0.7 (  06/05/2008)   Alk Phos:  85 (06/05/2008)   SGPT (ALT):  14 (06/05/2008)   SGOT (AST):  10 (06/05/2008)   Current Medications (verified): 1)  Albuterol 90 Mcg/act  Aers (Albuterol) .... Use As Directed As Needed 2)  Alprazolam 0.25 Mg Tabs (Alprazolam) .... Take 1/2-1 Tablet By Mouth Daily As Needed. No Further Refills Per Dr Juanda Chance...must Get From Primary Care Dr 3)  Ibuprofen 200 Mg Tabs (Ibuprofen) .... As Needed 4)  Cpap Machine .... Use Nightly 5)  Paxil 10 Mg Tabs (Paroxetine Hcl) .... Take 1 Tab By Mouth At Bedtime 6)  Tums 500 Mg Chew (Calcium Carbonate Antacid) .... Take 2 By Mouth Qd  Allergies (verified): No Known Drug Allergies  Past History:  Past Medical History: OBESITY  POLYCYSTIC OVARIAN DISEASE -[facility] (gyn) OBSTRUCTIVE SLEEP APNEA - sood (pulm) ASTHMA Diverticulosis Urinary Tract Infection,  hx Depression/anxiety GERD  Past Surgical History: c-section x 2 (97 & 98) tubal ligation (1998) cocxy removal (1995) D& C   Family History: Reviewed history from 03/04/2008 and no changes required. Family History of Heart Disease: Maternal Grandmother Family History of Pancreatic Cancer:Maternal Grandmother (Diagnosed age 7) Family History of Prostate Cancer: Maternal Grandfather (diagnosed in 98's) Family History of Colon Polyps: Maternal Grandfather Family History of Diabetes: Maternal Aunt  Social History: Alcohol Use - yes-rare Illicit Drug Use - no Daily Caffeine Use Patient currently smokes.  lives with 2nd husband and 2 kids from 1st [place] (1st husband expired 1998- leukemia) works as Lawyer (Dietitian) but curretly unemployed Does Patient Exercise:  no  Review of Systems       see HPI above. I have reviewed all other systems and they were negative.   Physical Exam  General:  overweight-appearing.  alert, well-developed, well-nourished, and cooperative to examination.    Eyes:  vision grossly intact; pupils equal, round and reactive to light.  conjunctiva and lids normal.    Ears:  normal pinnae bilaterally, without erythema, swelling, or tenderness to palpation. TMs clear, without effusion, or cerumen impaction. Hearing grossly normal bilaterally  Mouth:  +cold sore outbreak top lip, central location - teeth and gums in good repair; mucous membranes moist, without lesions or ulcers. oropharynx clear without exudate, erythema.  Lungs:  normal respiratory effort, no intercostal retractions or use of accessory muscles; normal breath sounds bilaterally - no crackles and no wheezes.    Heart:  normal rate, regular rhythm, no murmur, and no rub. BLE without edema. normal DP pulses and normal cap refill in all 4 extremities    Abdomen:  obese, soft, non-tender, normal bowel sounds, no distention; no masses and no appreciable hepatomegaly or splenomegaly.   Msk:   No deformity or scoliosis noted of thoracic or lumbar spine.   Neurologic:  alert & oriented X3 and cranial nerves II-XII symetrically intact.  strength normal in all extremities, sensation intact to light touch, and gait normal. speech fluent without dysarthria or aphasia; follows commands with good comprehension.  Psych:  Oriented X3, memory intact for recent and remote, normally interactive, good eye contact,minimally anxious appearing, not depressed appearing, and not agitated.      Impression & Recommendations:  Problem # 1:  DEPRESSION (ICD-311)  complicated by "panic" attacks - educated on role and mech of action of Paxil and xanax will treat symptoms by increase SSRI and encouraged cont sparing use of xanax - but ok to use together if needed followup 4-8 weeks on effects of this change discussed counseling referral -  pt wished to wait at this time Her updated medication list for this problem includes:    Alprazolam 0.25 Mg Tabs (Alprazolam) .[name]... Take 1/2-1 tablet by mouth daily as needed. no further refills per dr Juanda Chance...must get from primary care dr    Paxil 20 Mg Tabs (Paroxetine hcl) .[name]... 1 by mouth once daily  Orders: Prescription Created Electronically [id number])  Problem # 2:  COLD SORE (ICD-054.9)  famvir no longer effective -  try valtrex per outbreak, ?eval need for suppressive therapy  Orders: Prescription Created Electronically [phone])  Problem # 3:  OBSTRUCTIVE SLEEP APNEA (ICD-327.23)  refer back to dr. Craige Cotta - ?need repeat sleep study re: need for CPAP  Orders: Pulmonary Referral (Pulmonary)  Problem # 4:  GERD (ICD-530.81) not on daily PPI but symptoms not bothersome at this time -  EGD/colo 4/10 results reviewed - mgmt per dr. Juanda Chance as needed  The following medications were removed from the medication list:    Prilosec 20 Mg Cpdr (Omeprazole) .[name]... Take 1 tablet by mouth at bedtime    Prilosec 40 Mg Cpdr (Omeprazole) .[name]... 1 each day 30 minutes  before meal Her updated medication list for this problem includes:    Tums 500 Mg Chew (Calcium carbonate antacid) .[name]... Take 2 by mouth qd  Problem # 5:  OBESITY (ICD-278.00) educated on need for diet mgmt and exercise to improve health and lose weight pt reports family is joining the [facility] - will followup next visit Ht: 65 (01/28/2009)   Wt: 282.2 (01/28/2009)   BMI: 47.18 (06/05/2008)  Complete Medication List: 1)  Albuterol 90 Mcg/act Aers (Albuterol) .... Use as directed as needed 2)  Alprazolam 0.25 Mg Tabs (Alprazolam) .... Take 1/2-1 tablet by mouth daily as needed. no further refills per dr Juanda Chance...must get from primary care dr 3)  Ibuprofen 200 Mg Tabs (Ibuprofen) .... As needed 4)  Cpap Machine  .... Use nightly 5)  Paxil 20 Mg Tabs (Paroxetine hcl) .[name].. 1 by mouth once daily 6)  Tums 500 Mg Chew (Calcium carbonate antacid) .... Take 2 by mouth qd 7)  Valtrex 1 Gm Tabs (Valacyclovir hcl) .... 2 tabs by mouth two times a day x 1 day - start at onset of cold sore symptoms (tingling)  Patient Instructions: 1)  it was good to meet you today 2)  will increase your paxil to 20mg  once daily  3)  followup here 4-8 weeks to evaluate the effects of this medicine change 4)  continue xanax only as needed (as discussed) and call if refills are needed 5)  try valtrex for outbreaks of cold sores - take 2 tabs when symptoms begin, then 2 tabs more 12hours after 1st dose (not every day - only for outbreaks) 6)  schedule for physical next year for your annual labs when you are due 7)  we'll make referral back to dr. Craige Cotta. Our office will contact you regarding this appointment once made.  Prescriptions: VALTREX 1 GM TABS (VALACYCLOVIR HCL) 2 tabs by mouth two times a day x 1 day - start at onset of cold sore symptoms (tingling)  #4 x 3   Entered and Authorized by:   Newt Lukes MD   Signed by:   Newt Lukes MD on 01/28/2009   Method used:   [facility]        [facility] [phone][facility] (retail)       [address]       [place]  [place], [place]  [address]       Ph: [phone] or [phone]       Fax: [phone]   RxID:   [id number] PAXIL 20 MG TABS (PAROXETINE HCL) 1 by mouth once daily  #30 x 5   Entered and Authorized by:   Newt Lukes MD   Signed by:   Newt Lukes MD on 01/28/2009   Method used:   [facility] to        [facility] [phone]* (retail)       [address]       [place], [place]  [address]       Ph: [phone] or [phone]       Fax: [phone]   RxID:   [id number]   Preventive Care Screening  Pap Smear:    Date:  04/11/2008    Results:  normal   Last Tetanus Booster:    Date:  04/12/2007    Results:  Historical

## 2010-05-13 NOTE — Progress Notes (Signed)
Summary: ? ECL  Phone Note Call from Patient Call back at Home Phone [phone]   Caller: Patient Call For: BRODIE  Reason for Call: Talk to Nurse Details for Reason: ? ECL  Summary of Call: ECL sch next week --pt having anxiety about proc has ? re: proc Initial call taken by: Guadlupe Spanish [facility],  June 23, 2008 11:54 AM  Follow-up for Phone Call        Procedures explained to patient, all questions answered. Pt. instructed to call back as needed.  Follow-up by: Laureen Ochs LPN,  June 23, 2008 12:29 PM

## 2010-05-13 NOTE — Letter (Signed)
Summary: CPAP Supplies/[facility]  CPAP Supplies/[facility]   Imported By: Sherian Rein 06/26/2008 08:29:43  _____________________________________________________________________  External Attachment:    Type:   Image     Comment:   External Document

## 2010-05-13 NOTE — Assessment & Plan Note (Signed)
Summary: SINUS PROBLEM  PER KELLY SCHED--STC   Vital Signs:  Patient profile:   40 year old female Height:      65 inches (165.10 cm) Weight:      282 pounds (128.18 kg) BMI:     47.10 O2 Sat:      96 % on Room air Temp:     98.3 degrees F (36.83 degrees C) oral Pulse rate:   93 / minute BP sitting:   120 / 100  (left arm)  Vitals Entered By: Orlan Leavens (May 25, 2009 1:07 PM)  O2 Flow:  Room air CC: sinus drainage, cough, sore throat, fever x's 4 days. Pt states she went to ER and urgent care was rx Zpack still currently taking Is Patient Diabetic? No Pain Assessment Patient in pain? no        Primary Care Provider:  Newt Lukes MD  CC:  sinus drainage, cough, sore throat, and fever x's 4 days. Pt states she went to ER and urgent care was rx Zpack still currently taking.  History of Present Illness: here today with complaint of feeling bad with head congestion . onset of symptoms was 2 weeks ago. course has been gradual onset and now occurs in constant and progressive pattern. problem precipitated by +sick contacts and weather change symptom characterized as sinus pressure and drainage problem associated with head congestion and LGF, HA, hoarseness and dizziness  but not associated with CP, SOB or rash  . symptoms improved by nothing. symptoms worsened with lying flat -"i wheeze at night". has been seen by urg care and ER for same symptoms in past few days.   Current Medications (verified): 1)  Albuterol 90 Mcg/act  Aers (Albuterol) .... Use As Directed As Needed 2)  Ibuprofen 200 Mg Tabs (Ibuprofen) .... As Needed 3)  Cpap Machine .... Use Nightly 4)  Paxil 20 Mg Tabs (Paroxetine Hcl) .[name].. 1 By Mouth Once Daily 5)  Tums 500 Mg Chew (Calcium Carbonate Antacid) .... Take 2 By Mouth Qd 6)  Valtrex 1 Gm Tabs (Valacyclovir Hcl) .... 2 Tabs By Mouth Two Times A Day X 1 Day - Start At Onset of Cold Sore Symptoms (Tingling) 7)  Zithromax Z-Pak 250 Mg Tabs  (Azithromycin) .... Take 2 First Day 1 For 5 Days 8)  Mucinex Dm 30-600 Mg Xr12h-Tab (Dextromethorphan-Guaifenesin) .... Take Two Times A Day 9)  Tylenol Extra Strength 500 Mg Tabs (Acetaminophen) .... Take 1-2 As Needed  Allergies (verified): No Known Drug Allergies  Past History:  Past Medical History: OBESITY  POLYCYSTIC OVARIAN DISEASE  OBSTRUCTIVE SLEEP APNEA - ASTHMA Diverticulosis Urinary Tract Infection, hx Depression/anxiety GERD  MD rooster: gyn -[facility]vath pulm -Sood  Review of Systems       The patient complains of fever and hoarseness.  The patient denies anorexia, decreased hearing, syncope, peripheral edema, hemoptysis, and abdominal pain.    Physical Exam  General:  overweight-appearing.  alert, well-developed, well-nourished, and cooperative to examination.   mom at side Eyes:  vision grossly intact; pupils equal, round and reactive to light.  conjunctiva and lids normal.    Ears:  normal pinnae bilaterally, without erythema, swelling, or tenderness to palpation. TMs clear, without effusion, or cerumen impaction. Hearing grossly normal bilaterally  Mouth:  teeth and gums in good repair; mucous membranes moist, without lesions or ulcers. oropharynx clear without exudate, erythema. +viral blisters at left periorbital edge Lungs:  normal respiratory effort, no intercostal retractions or use of accessory muscles; normal breath  sounds bilaterally with mild end exp wheeze (psuedowheeze) - no crackles    Heart:  normal rate, regular rhythm, no murmur, and no rub. BLE without edema.   Impression & Recommendations:  Problem # 1:  VIRAL URI (ICD-465.9)  no evidence for bacterial infx such as strep throat - would be covered by Zpack if present will treat with steroids for assoc asthma exac (whheze) -shot medrol and pred pak given today cont tylenol and ibuprofen as needed - encouraged pt to have patience with symptoms - will take a while to resolve! - reassurance  provided Her updated medication list for this problem includes:    Ibuprofen 200 Mg Tabs (Ibuprofen) .[name]... As needed    Mucinex Dm 30-600 Mg Xr12h-tab (Dextromethorphan-guaifenesin) .[name]... Take two times a day    Tylenol Extra Strength 500 Mg Tabs (Acetaminophen) .[name]... Take 1-2 as needed    Promethazine Hcl 25 Mg Tabs (Promethazine hcl) .[name]... 1 by mouth every 4 hours as needed for nausea  Orders: Depo- Medrol 80mg  (J1040) Depo- Medrol 40mg  (J1030) Admin of Therapeutic Inj  intramuscular or subcutaneous ([address])  Problem # 2:  COLD SORE (ICD-054.9) viral flare related to chronic condition and acute illness - cont topical and valtrex as needed   Problem # 3:  ASTHMA (ICD-493.90)  Her updated medication list for this problem includes:    Albuterol 90 Mcg/act Aers (Albuterol) ..... Use as directed as needed    Prednisone (pak) 10 Mg Tabs (Prednisone) .[name]... As directed x 6days  Complete Medication List: 1)  Albuterol 90 Mcg/act Aers (Albuterol) .... Use as directed as needed 2)  Ibuprofen 200 Mg Tabs (Ibuprofen) .... As needed 3)  Cpap Machine  .... Use nightly 4)  Paxil 20 Mg Tabs (Paroxetine hcl) .[name].. 1 by mouth once daily 5)  Tums 500 Mg Chew (Calcium carbonate antacid) .... Take 2 by mouth qd 6)  Valtrex 1 Gm Tabs (Valacyclovir hcl) .... 2 tabs by mouth two times a day x 1 day - start at onset of cold sore symptoms (tingling) 7)  Zithromax Z-pak 250 Mg Tabs (Azithromycin) .... Take 2 first day 1 for 5 days 8)  Mucinex Dm 30-600 Mg Xr12h-tab (Dextromethorphan-guaifenesin) .... Take two times a day 9)  Tylenol Extra Strength 500 Mg Tabs (Acetaminophen) .... Take 1-2 as needed 10)  Prednisone (pak) 10 Mg Tabs (Prednisone) .... As directed x 6days 11)  Promethazine Hcl 25 Mg Tabs (Promethazine hcl) .[name].. 1 by mouth every 4 hours as needed for nausea  Patient Instructions: 1)  it was good to see you today. 2)  steroids - shot and pred pak - for the wheeze and inflammation - your  prescription has been electronically submitted to your pharmacy. Please take as directed. Contact our office if you believe you're having problems with the medication(s).  3)  continue antibiotics as previously prescribed - no change 4)  phenergan as needed for nausea -  5)  Get plenty of rest, drink lots of clear liquids, and use Tylenol and/or Ibuprofen for fever and comfort. Return in 7-10 days if you're not better:sooner if you're feeling worse. Prescriptions: PROMETHAZINE HCL 25 MG TABS (PROMETHAZINE HCL) 1 by mouth every 4 hours as needed for nausea  #30 x 0   Entered and Authorized by:   Newt Lukes MD   Signed by:   Newt Lukes MD on 05/25/2009   Method used:   [facility] to        [id number] (retail)       [address]       [place], [place]  [address]       Ph: [phone] or [phone]       Fax: [phone]   RxID:   [id number] PREDNISONE (PAK) 10 MG TABS (PREDNISONE) as directed x 6days  #1 x 0   Entered and Authorized by:   Newt Lukes MD   Signed by:   Newt Lukes MD on 05/25/2009   Method used:   [facility]ronically to        [facility] [phone]* (retail)       [address]       [place], [place]  [address]       Ph: [phone] or [phone]       Fax: [phone]   RxID:   [id number]    Medication Administration  Injection # 1:    Medication: Depo- Medrol 80mg     Diagnosis: VIRAL URI (ICD-465.9)    Site: RUOQ gluteus    Exp Date: 12/2011    Lot #: [id number] Heidelberg    Mfr: [employer]    Patient tolerated injection without complications    Given by: Orlan Leavens (May 25, 2009 1:44 PM)  Injection # 2:    Medication: Depo- Medrol 40mg     Diagnosis: VIRAL URI (ICD-465.9)  Orders Added: 1)  Est. Patient Level IV [28315] 2)  Depo- Medrol 80mg  [J1040] 3)  Depo- Medrol 40mg  [J1030] 4)  Admin of Therapeutic Inj  intramuscular or subcutaneous [17616]

## 2010-05-13 NOTE — Procedures (Signed)
Summary: E[facility]  EGD/[facility]   Imported By: Lanelle Bal 04/21/2008 10:30:28  _____________________________________________________________________  External Attachment:    Type:   Image     Comment:   External Document

## 2010-05-13 NOTE — Letter (Signed)
Summary: Call A Nurse  Call A Nurse   Imported By: Sherian Rein 05/28/2009 11:24:56  _____________________________________________________________________  External Attachment:    Type:   Image     Comment:   External Document

## 2010-05-13 NOTE — Progress Notes (Signed)
Summary: paroxetine  Phone Note From Pharmacy   [facility] [phone]* Summary of Call: Recieved fax stating did'nt reciebed electronic refill on pt paroxetine. Resending script Initial call taken by: Orlan Leavens,  September 10, 2009 12:08 PM    Prescriptions: PAXIL 20 MG TABS (PAROXETINE HCL) 1 by mouth once daily  #30 Tablet x 0   Entered by:   Orlan Leavens   Authorized by:   Newt Lukes MD   Signed by:   Orlan Leavens on 09/10/2009   Method used:   [facility] ...       [facility]  [address] [phone]* (retail)       [address]       [place], [place]  [address]       Ph: [phone] or [phone]       Fax: [phone]   RxID:   [id number]

## 2010-05-13 NOTE — Assessment & Plan Note (Signed)
Summary: DISCUSS MEDS AND ISSUES/NWS   #   Vital Signs:  Patient profile:   40 year old female Height:      65 inches (165.10 cm) Weight:      294.4 pounds (133.82 kg) O2 Sat:      97 % on Room air Temp:     97.1 degrees F (36.17 degrees C) oral Pulse rate:   68 / minute BP sitting:   120 / 78  (left arm) Cuff size:   large  Vitals Entered By: Orlan Leavens RMA (December 16, 2009 8:37 AM)  O2 Flow:  Room air CC: Discuss meds Is Patient Diabetic? Yes Did you bring your meter with you today? No Pain Assessment Patient in pain? no      Comments Pt states she never started the metformin & phentermine. Also want to come off paxil   Primary Care Provider:  Newt Lukes MD  CC:  Discuss meds.  History of Present Illness: depression/anxiety-  denies hx same - med started for stomach problems by GI- on paxil for >74mo w/o change - wants to wean off med b/c doesn't feel med needed - c/o excess fatigue and sleeping 10-12h/d denies signs depression - no sleeping or mood or irritability problems but reports feeling very nervous since dx of mom with breast cancer 3 weeks ago - very tearful prev symptoms improved with xanax as needed - would like this resumed  PCOS - never started metformin - following with gyn only annually - only 1 period in last 6 mos  obesity - weight gain noted - never started phenteramine  Current Medications (verified): 1)  Albuterol 90 Mcg/act  Aers (Albuterol) .... Use As Directed As Needed 2)  Ibuprofen 200 Mg Tabs (Ibuprofen) .... As Needed 3)  Cpap Machine .... Use Nightly 4)  Paxil 20 Mg Tabs (Paroxetine Hcl) .[name].. 1 By Mouth Once Daily 5)  Tums 500 Mg Chew (Calcium Carbonate Antacid) .... Take 2 By Mouth Qd 6)  Tylenol Extra Strength 500 Mg Tabs (Acetaminophen) .... Take 1-2 As Needed 7)  Promethazine Hcl 25 Mg Tabs (Promethazine Hcl) .[name].. 1 By Mouth Every 4 Hours As Needed For Nausea 8)  Metformin Hcl 500 Mg Xr24h-Tab (Metformin Hcl) .[name].. 1 By  Mouth Once Daily  Allergies (verified): No Known Drug Allergies  Past History:  Past Medical History: OBESITY  POLYCYSTIC OVARIAN DISEASE  OBSTRUCTIVE SLEEP APNEA - ASTHMA Diverticulosis Urinary Tract Infection, hx Depression/anxiety GERD  MD roster: gyn -horvath pulm -[facility] - d brodie  Review of Systems  The patient denies anorexia, fever, weight loss, chest pain, and headaches.    Physical Exam  General:  overweight-appearing.  alert, well-developed, well-nourished, and cooperative to examination.   spouse at side Lungs:  normal respiratory effort, no intercostal retractions or use of accessory muscles; normal breath sounds bilaterally - no wheeze- no crackles    Heart:  normal rate, regular rhythm, no murmur, and no rub. BLE without edema. Psych:  Oriented X3, memory intact for recent and remote, normally interactive, good eye contact, not anxious appearing, not depressed appearing, and not agitated.      Impression & Recommendations:  Problem # 1:  DEPRESSION (ICD-311) currently, symptoms exac by situational - mom dx breast cancer 3 weeks ago - explained to pt excess sedation/sleeping may be d/t depression and not med(paxil) however, after d/w pt and spouse, ok to wean off paxil and watch for increase in depression symptoms - use xanax as needed for  anxiety and panic symptoms  recheck 4 weeks to reeval symptoms and consider alt tx (citalopram?) as needed, sooner if probs - pt/spouse agree refer for mammo at pt request due to +FH dx in past 3 weeks Her updated medication list for this problem includes:    Paxil 20 Mg Tabs (Paroxetine hcl) .[name]... 1/2 by mouth once daily x 2 weeks, then 1/2 every other day x 2 weeks, then stop    Alprazolam 0.5 Mg Tabs (Alprazolam) .[name]... 1/2-1 by mouth every 8 hours as needed for anxiety or panic symptoms  Problem # 2:  POLYCYSTIC OVARIAN DISEASE (ICD-256.4)  hold metformin at this time - encouraged f/u with gyn but pt reluctant  due to poss hysterectomy rec (per last gyn OV)  Complete Medication List: 1)  Albuterol 90 Mcg/act Aers (Albuterol) .... Use as directed as needed 2)  Ibuprofen 200 Mg Tabs (Ibuprofen) .... As needed 3)  Cpap Machine  .... Use nightly 4)  Paxil 20 Mg Tabs (Paroxetine hcl) .... 1/2 by mouth once daily x 2 weeks, then 1/2 every other day x 2 weeks, then stop 5)  Tums 500 Mg Chew (Calcium carbonate antacid) .... Take 2 by mouth qd 6)  Tylenol Extra Strength 500 Mg Tabs (Acetaminophen) .... Take 1-2 as needed 7)  Promethazine Hcl 25 Mg Tabs (Promethazine hcl) .[name].. 1 by mouth every 4 hours as needed for nausea 8)  Alprazolam 0.5 Mg Tabs (Alprazolam) .... 1/2-1 by mouth every 8 hours as needed for anxiety or panic symptoms  Other Orders: Radiology Referral (Radiology)  Patient Instructions: 1)  it was good to see you today. 2)  do not start metformin or phenteramine at this time 3)  wean off paxil as discussed - see directions below - 4)  use xanax as needed for panic or anxiety symptoms 5)  we'll make referral for mammogram.  Our office will contact you regarding this appointment once made.  6)  Please schedule a follow-up appointment in 4-6 weeks to review weight, depression and anxiety symptoms and possible need for other medications - call sooner if problems! Prescriptions: ALPRAZOLAM 0.5 MG TABS (ALPRAZOLAM) 1/2-1 by mouth every 8 hours as needed for anxiety or panic symptoms  #30 x 1   Entered and Authorized by:   Newt Lukes MD   Signed by:   Newt Lukes MD on 12/16/2009   Method used:   Print then Give to Patient   RxID:   [id number]

## 2010-05-13 NOTE — Letter (Signed)
Summary: Appointment - Reminder 2  [facility], Main Office  [address]   [place], [place] [address]   Phone: [phone]  Fax: [phone]     September 19, 2008 MRN: [id number]   [name] [address] [place], [place]  [address]   Dear [name],  Our records indicate that it is time to schedule a follow-up appointment.  Dr.Crenshaw recommended that you follow up with [place] in August 2010. It is very important that we reach you to schedule this appointment. We look forward to participating in your health care needs. Please contact us at the number listed above at your earliest convenience to schedule your appointment.  If you are unable to make an appointment at this time, give [place] a call so we can update our records.     Sincerely,  Lorne Skeens  [facility] Scheduling Team

## 2010-05-13 NOTE — Letter (Signed)
Summary: Patient Notice- Colon Biospy Results  [facility]  [address] [place], [place] [address]   Phone: [phone]  Fax: [phone]        July 26, 2008 MRN: [id number]    [name] [address] [place], [place]  [address]    Dear Ms. Erich,  I am pleased to inform you that the biopsies taken during your recent colonoscopy did not show any evidence of cancer upon pathologic examination.The biopsies from Your stomach as well as from Your colon show normal tissue, No evidence of colitis or ulcer.  Additional information/recommendations:  __No further action is needed at this time.  Please follow-up with      your primary care physician for your other healthcare needs.  __Please call [phone] to schedule a return visit to review      your condition.  _x_Continue with the treatment plan as outlined on the day of your      exam.    Please call us if you are having persistent problems or have questions about your condition that have not been fully answered at this time.  Sincerely,  [name]   This letter has been electronically signed by your physician.

## 2010-05-13 NOTE — Assessment & Plan Note (Signed)
Summary: 6 wk f/u #/cd   Vital Signs:  Patient profile:   40 year old female Height:      65 inches (165.10 cm) Weight:      292.4 pounds (132.91 kg) O2 Sat:      96 % on Room air Temp:     97.7 degrees F (36.50 degrees C) oral Pulse rate:   89 / minute BP sitting:   118 / 84  (left arm) Cuff size:   large  Vitals Entered By: Orlan Leavens RMA (January 27, 2010 9:19 AM)  O2 Flow:  Room air CC: 6 week follow-up Is Patient Diabetic? No Pain Assessment Patient in pain? no        Primary Care Provider:  Newt Lukes MD  CC:  6 week follow-up.  History of Present Illness: seen last week for ear pain in R lobe improved s/p 7 d abx - not red, swollen or painful, but cyst remains  depression/anxiety-  denies hx same - med started for stomach problems by GI- on paxil for >53mo in 2011 w/o change - wean off med 12/2009 feels more irritability since being off med c/o continued excess fatigue and sleeping 10-12h/d but reports feeling very nervous since dx of mom with breast cancer 11/2009 symptoms improved with xanax as needed, rare use  PCOS - never started metformin - following with gyn only annually - only 1 period in last 6 mos  obesity - weight gain noted - never started phentermine - ?bariatric clinic for "vitamins and B12 shots"  Current Medications (verified): 1)  Albuterol 90 Mcg/act  Aers (Albuterol) .... Use As Directed As Needed 2)  Ibuprofen 200 Mg Tabs (Ibuprofen) .... As Needed 3)  Cpap Machine .... Use Nightly 4)  Tums 500 Mg Chew (Calcium Carbonate Antacid) .... Take 2 By Mouth Qd 5)  Tylenol Extra Strength 500 Mg Tabs (Acetaminophen) .... Take 1-2 As Needed 6)  Promethazine Hcl 25 Mg Tabs (Promethazine Hcl) .[name].. 1 By Mouth Every 4 Hours As Needed For Nausea 7)  Alprazolam 0.5 Mg Tabs (Alprazolam) .... 1/2-1 By Mouth Every 8 Hours As Needed For Anxiety or Panic Symptoms  Allergies (verified): No Known Drug Allergies  Past History:  Past Medical  History: OBESITY  POLYCYSTIC OVARIAN DISEASE  OBSTRUCTIVE SLEEP APNEA - ASTHMA Diverticulosis Urinary Tract Infection,  hx Depression/anxiety GERD    MD roster: gyn -horvath pulm -[facility] - d brodie  Review of Systems  The patient denies chest pain, syncope, peripheral edema, and abdominal pain.    Physical Exam  General:  overweight-appearing.  alert, well-developed, well-nourished, and cooperative to examination.   spouse at side Ears:  improved R earlobe with resolution of small inflammed nodule, cyst remains intact but no fluctuence or drainage -normal left pinna, without erythema, swelling, or tenderness to palpation.  B TMs clear, without effusion, or cerumen impaction. Hearing grossly normal bilaterally  Lungs:  normal respiratory effort, no intercostal retractions or use of accessory muscles; normal breath sounds bilaterally - no wheeze- no crackles    Heart:  normal rate, regular rhythm, no murmur, and no rub. BLE without edema. Psych:  Oriented X3, memory intact for recent and remote, normally interactive, good eye contact, not anxious appearing, not depressed appearing, and not agitated.      Impression & Recommendations:  Problem # 1:  DEPRESSION (ICD-311)  Her updated medication list for this problem includes:    Alprazolam 0.5 Mg Tabs (Alprazolam) .[name]... 1/2-1 by mouth every 8 hours  as needed for anxiety or panic symptoms    Paroxetine Hcl 10 Mg Tabs (Paroxetine hcl) .[name]... 1 by mouth once daily  currently, symptoms exac by situational - mom dx breast cancer 11/2009 - tried wean off last 6 weeks -- inc irritability resume at low dose now - pt agrees - paxil erx done remnded pt excess sedation/sleeping may be d/t depression and not med(paxil) ok to use xanax as needed for anxiety and panic symptoms  recheck 12 weeks to reeval symptoms and consider alt tx (citalopram?) as needed, sooner if probs - pt agree  Orders: Prescription Created Electronically  [phone])  Problem # 2:  EAR PAIN, RIGHT (ICD-388.70)  inflammed earlobe cyst improved s/p 7 d abx (septra) - cyst remains if inc size, pain or redness - call and will resume abx + refer to ENT to consiuder surg removal as needed  Discussed symptom control.   Complete Medication List: 1)  Albuterol 90 Mcg/act Aers (Albuterol) .... Use as directed as needed 2)  Ibuprofen 200 Mg Tabs (Ibuprofen) .... As needed 3)  Cpap Machine  .... Use nightly 4)  Tums 500 Mg Chew (Calcium carbonate antacid) .... Take 2 by mouth qd 5)  Tylenol Extra Strength 500 Mg Tabs (Acetaminophen) .... Take 1-2 as needed 6)  Promethazine Hcl 25 Mg Tabs (Promethazine hcl) .[name].. 1 by mouth every 4 hours as needed for nausea 7)  Alprazolam 0.5 Mg Tabs (Alprazolam) .... 1/2-1 by mouth every 8 hours as needed for anxiety or panic symptoms 8)  Paroxetine Hcl 10 Mg Tabs (Paroxetine hcl) .[name].. 1 by mouth once daily  Patient Instructions: 1)  it was good to see you today. 2)  resume low dose Paxil - your prescription has been electronically submitted to your pharmacy. Please take as directed. Contact our office if you believe you're having problems with the medication(s). 3)  call if increaseing redness, pain or swelling of right earlobe cyst and we will resume antibioticsa + refer to ENT specialist as needed 4)  Please schedule a follow-up appointment in 12 weeks to monitor depression and anxiey symptoms, call sooner if problems.   Prescriptions: PAROXETINE HCL 10 MG TABS (PAROXETINE HCL) 1 by mouth once daily  #30 x 6   Entered and Authorized by:   Newt Lukes MD   Signed by:   Newt Lukes MD on 01/27/2010   Method used:   [facility]ronically to        [facility] [phone]* (retail)       [address]       [place], [place]  [address]       Ph: [phone] or [phone]       Fax: [phone]   RxID:   [id number]    Orders Added: 1)  Est. Patient Level IV [70623] 2)   Prescription Created Electronically [id number]

## 2010-05-13 NOTE — Procedures (Signed)
Summary: EGD   EGD  Procedure date:  07/24/2008  Findings:      Location: [facility]    ENDOSCOPY PROCEDURE REPORT  PATIENT:  [name], [name]  MR#:  [id number] BIRTHDATE:   Oct 16, 1970, 37 yrs. old   GENDER:   female  ENDOSCOPIST:   Hedwig Morton. Juanda Chance, MD Referred by: Carrington Clamp, M.D.  PROCEDURE DATE:  07/24/2008 PROCEDURE:  EGD with biopsy ASA CLASS:   Class I INDICATIONS: heartburn, GERD, nausea EGD 2006 showed gastritis, H.Pylori positive, treated  pt 's Sx's refractory to prelosec 20 mg qd  Abd sono 2009 was normal  lot of stress, Paxil 10 mg qd has helped  MEDICATIONS:    Versed 7 mg, Fentanyl 75 mcg TOPICAL ANESTHETIC:   Exactacain Spray  DESCRIPTION OF PROCEDURE:   After the risks benefits and alternatives of the procedure were thoroughly explained, informed consent was obtained.  The LB [id number] endoscope was introduced through the mouth and advanced to the second portion of the duodenum, without limitations.  The instrument was slowly withdrawn as the mucosa was fully examined. <<PROCEDUREIMAGES>>          <<OLD IMAGES>>  Esophagitis was found in the distal esophagus. several 5 mm erosions at the g-e junction With standard forceps, a biopsy was obtained and sent to pathology (see image2, image7, and image6).  The stomach was entered and closely examined. The antrum, angularis, and lesser curvature were well visualized, including a retroflexed view of the cardia and fundus. The stomach wall was normally distensable. The scope passed easily through the pylorus into the duodenum (see image5 and image3).  The duodenal bulb was normal in appearance, as was the postbulbar duodenum (see image4).    Retroflexed views revealed no abnormalities.    The scope was then withdrawn from the patient and the procedure completed.  COMPLICATIONS:   None  ENDOSCOPIC IMPRESSION:  1) Esophagitis in the distal esophagus  2) Normal stomach  3) Normal  duodenum  Grade 1 esdophagitis, s/p biopsies to r/o Barrett's esophagus  no evidence of gastritis RECOMMENDATIONS:  increase Prelosec to 40 mg po qd  colonoscopy  REPEAT EXAM:   In 0 year(s) for.   _______________________________ Hedwig Morton. Juanda Chance, MD    CC: Carrington Clamp, MD    REPORT OF SURGICAL PATHOLOGY   Case #: [id number] Patient Name: [name], [name]. Office Chart Number:  [id number]   MRN: [id number] Pathologist: Beulah Gandy. Luisa Hart, MD DOB/Age  [birth year] (Age: 52)    Gender: F Date Taken:  07/24/2008 Date Received: 07/24/2008   FINAL DIAGNOSIS   ***MICROSCOPIC EXAMINATION AND DIAGNOSIS***   1.  GASTROESOPHAGEAL JUNCTION, BIOPSIES:  BENIGN GASTROESOPHAGEAL JUNCTION MUCOSA.  NO INTESTINAL METAPLASIA, DYSPLASIA OR MALIGNANCY IDENTIFIED.    2.  COLON, RANDOM BIOPSIES:  BENIGN COLONIC MUCOSA.  NO ACTIVE INFLAMMATION, MICROSCOPIC COLITIS OR GRANULOMAS.    COMMENT 1.  An Alcian Blue stain is performed to determine the presence of intestinal metaplasia (goblet cell metaplasia). No intestinal metaplasia (goblet cell metaplasia) is identified with the Alcian Blue stain. The control stained appropriately.   2.  There is colorectal mucosa with normal crypt architecture and no objective increase in inflammation.  No active inflammation, microscopic colitis, collagenous colitis or significant chronic changes identified. No hyperplastic or adenomatous changes are seen, and there is no evidence of malignancy. (JDP:mj 07/25/08)   mj Date Reported:  07/25/2008     Beulah Gandy. Luisa Hart, MD *** Electronically Signed Out By JDP ***    July 26, 2008 MRN: [id number]    [facility] [address] [place], [place]  [address]    Dear Ms. Damaso,  I am pleased to inform you that the biopsies taken during your recent colonoscopy did not show any evidence of cancer upon pathologic examination.The biopsies from Your stomach as well as from Your colon show normal tissue, No evidence of colitis  or ulcer.  Additional information/recommendations:  __No further action is needed at this time.  Please follow-up with      your primary care physician for your other healthcare needs.  __Please call [phone] to schedule a return visit to review      your condition.  _x_Continue with the treatment plan as outlined on the day of your      exam.    Please call us if you are having persistent problems or have questions about your condition that have not been fully answered at this time.  Sincerely,  [name]   This letter has been electronically signed by your physician.  This report was created from the original endoscopy report, which was reviewed and signed by the above listed endoscopist.

## 2010-05-13 NOTE — Miscellaneous (Signed)
----   05/19/2008 12:22 PM, Hortense Ramal CMA wrote: Dr Juanda Chance- Patient requests refills of xanax....are you ok with refilling or was this a temporary thing when you saw her at her last office visit? Dottie  one more refill, then no further refills- DB  Clinical Lists Changes  Medications: Changed medication from ALPRAZOLAM 0.25 MG TABS (ALPRAZOLAM) Take 1/2-1 tablet by mouth daily as needed to ALPRAZOLAM 0.25 MG TABS (ALPRAZOLAM) Take 1/2-1 tablet by mouth daily as needed. NO FURTHER REFILLS PER DR BRODIE...MUST GET FROM PRIMARY CARE DR - Signed Rx of ALPRAZOLAM 0.25 MG TABS (ALPRAZOLAM) Take 1/2-1 tablet by mouth daily as needed. NO FURTHER REFILLS PER DR BRODIE...MUST GET FROM PRIMARY CARE DR;  #30 x 0;  Signed;  Entered by: Hortense Ramal CMA;  Authorized by: [name] MD;  Method used: [facility] then [facility] [phone]*, [address] [place], [place], [place]  [address], Ph: [phone] or [phone], Fax: [phone]    Prescriptions: ALPRAZOLAM 0.25 MG TABS (ALPRAZOLAM) Take 1/2-1 tablet by mouth daily as needed. NO FURTHER REFILLS PER DR BRODIE...MUST GET FROM PRIMARY CARE DR  #30 x 0   Entered by:   Hortense Ramal CMA   Authorized by:   [name] MD   Signed by:   Hortense Ramal CMA on 05/19/2008   Method used:   Printed then faxed [facility] ...       [facility]  [address] [phone]* (retail)       [address]       [place], [place]  [address]       Ph: [phone] or [phone]       Fax: [phone]   RxID:   [id number]

## 2010-05-13 NOTE — Progress Notes (Signed)
Summary: Sick call  Phone Note Call from Patient   Summary of Call: Patient called stating that she has been seen 2x this weekend for being sick, once at Urgent Care and then the ER. Patient has been told that she has the flu at one area and then the following day she was told that she did not have the flu, but did have a sinus infection. Patient was put on Z Pak and then advised to stop that medicine. Patient was advised to come into the office to see MD and trasferred to scheduleing. Initial call taken by: Lucious Groves,  May 25, 2009 9:32 AM

## 2010-05-13 NOTE — Assessment & Plan Note (Signed)
Summary: VOMITTING ONLY IN THE MORNINGS/YF   History of Present Illness Visit Type: follow up Primary GI MD: Lina Sar MD Requesting Provider: Carrington Clamp, MD Chief Complaint: nausea History of Present Illness:   This is a 40 year old white female with chronic digestive problems and multiple symptoms pertaining to her chest and abdomen. Her symptoms started in Aug 24, 1996 when her first husband died of cancer and she has was left with 2 infant children. She since then has remarried and is quite happy but is having a lot of stress. She had a GI evaluation by Dr. Evette Cristal in 24-Aug-2004. She had an abnormal HIDA scan at [facility] in 08/25/02 as well as an upper endoscopy in 2004/08/24 which showed mild gastritis and duodenitis. She had a positivi H.Pylori antibody. and was treated for it. Patient has been overweight. Her sister was diagnosed with gallstones but has not had her gallbladder removed. 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 Her upper abdominal ultrasound in April 2009 was normal.    GI Review of Systems    Reports acid reflux, nausea, and  vomiting.     Location of  Abdominal pain: LUQ.    Denies abdominal pain, belching, bloating, chest pain, dysphagia with liquids, dysphagia with solids, heartburn, loss of appetite, vomiting blood, weight loss, and  weight gain.      Reports diarrhea.     Denies anal fissure, black tarry stools, change in bowel habit, constipation, diverticulosis, fecal incontinence, heme positive stool, hemorrhoids, irritable bowel syndrome, jaundice, light color stool, liver problems, rectal bleeding, and  rectal pain.   Prior Medications Reviewed Using: Patient Recall  Updated Prior Medication List: ALBUTEROL 90 MCG/ACT  AERS (ALBUTEROL) Use as directed as needed PRILOSEC 20 MG CPDR (OMEPRAZOLE) Take 1 tablet by mouth at  bedtime ALPRAZOLAM 0.25 MG TABS (ALPRAZOLAM) Take 1/2-1 tablet by mouth daily as needed. 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)       OBSTRUCTIVE SLEEP APNEA (ICD-327.23)       ASTHMA (ICD-493.90)       Diverticulosis       Ulcerative Colitis       Urinary Tract Infection  Past Surgical History:    c-section x 2    tubal ligation    cocxy removal    D& C    Family History:    Reviewed history from 03/04/2008 and no changes required:       Family History of Heart Disease: Maternal Grandmother       Family History of Pancreatic Cancer:Maternal Grandmother (Diagnosed age 71)       Family History of Prostate Cancer: Maternal Grandfather (diagnosed in 86's)       Family History of Colon Polyps: Maternal Grandfather       Family History of Diabetes: Maternal Aunt  Social History:    Reviewed history  from 03/04/2008 and no changes required:       Alcohol Use - yes-rare       Illicit Drug Use - no       Daily Caffeine Use       Patient currently smokes.   Review of Systems       The patient complains of cough, fatigue, headaches-new, heart rhythm changes, muscle pains/cramps, swelling of feet/legs, and urine leakage.    Vital Signs:  Patient Profile:   40 Years Old Female Height:     65 inches Weight:      282.50 pounds BMI:     47.18 Pulse rate:   88 / minute Pulse rhythm:   regular BP sitting:   110 / 78  (left arm) Cuff size:   large  Vitals Entered By: June McMurray CMA (June 05, 2008 4:07 PM)                  Physical  Exam  General:     patient is anxious and somewhat agitated. She is overweight and very cooperative. Neck:     Supple; no masses or thyromegaly. Lungs:     Clear throughout to auscultation. Heart:     Regular rate and rhythm; no murmurs, rubs or bruits. Abdomen:     protuberant and large abdomen with normoactive bowel sounds and tenderness in left upper quadrant and also in the left lower quadrant. There is no palpable mass and no rebound. Liver edge is at the costal margin. There is no ascites. Extremities:     No clubbing, cyanosis, edema or deformities noted. Neurologic:     Alert and  oriented x4;  grossly normal neurologically.   Impression & Recommendations:  Problem # 1:  NAUSEA (ICD-787.02) persistent nausea and vomiting unrelated  to  meals. She had gastritis seen an endoscopy  6 years ago. We will proceed with an upper endoscopy to rule out recurrent H. pylori infection. We also need to rule out gastric outlet obstruction or peptic ulcer disease. Some of her symptoms may be stress related. I will start her on Paxil 10 mg at bedtime. Orders: TLB-TSH (Thyroid Stimulating Hormone) (84443-TSH) TLB-CBC Platelet - w/Differential (85025-CBCD) TLB-Sedimentation Rate (ESR) (85652-ESR) TLB-CMP (Comprehensive Metabolic Pnl) (80053-COMP)   Problem # 2:  CHANGE IN BOWELS (ICD-787.99) frequent bowel movements up to several times a day which are narrow caliber and loose. There is no nocturnal diarrhea and no bleeding. Patient has tenderness in left lower quadrant. There is no family history of inflammatory bowel disease. We will obtain a sedimentation rate , and general metabolic panel as well as a TSH. We will also proceed with a colonoscopy.   Patient Instructions: 1)  Prilosec 20 mg p.o. q.d. 2)  Paxil 10 mg p.o. q.h.s. 3)  CBC, metabolic panel, TSH and sedimentation rate 4)  Copy Sent To:Dr Dent Plantz Cloud  Prescriptions: PAXIL 10 MG TABS (PAROXETINE HCL) Take 1 tab by mouth at bedtime   #30 x 3   Entered by:   Hortense Ramal CMA   Authorized by:   [name] MD   Signed by:   Hortense Ramal CMA on 06/05/2008   Method used:   [facility] to        [facility] [phone][facility] (retail)       [address]       [place], [place]  [address]       Ph: [phone] or  [phone]       Fax: [phone]   RxID:   [id number] DULCOLAX 5 MG  TBEC (BISACODYL) Day before procedure take 2 at 3pm and 2 at 8pm.  #4 x 0   Entered by:   Hortense Ramal CMA   Authorized by:   [name] MD   Signed by:   Hortense Ramal CMA on 06/05/2008   Method used:   Electronically to        [facility] [phone]* (retail)       [address]       [place], [place]  [address]       Ph: [phone] or [phone]       Fax: [phone]   RxID:   [id number] REGLAN 10 MG  TABS (METOCLOPRAMIDE HCL) As per prep instructions.  #2 x 0   Entered by:   Hortense Ramal CMA   Authorized by:   [name] MD   Signed by:   Hortense Ramal CMA on 06/05/2008   Method used:   Electronically to        [facility] [phone]* (retail)       [address]       [place], [place]  [address]       Ph: [phone] or [phone]       Fax: [phone]   RxID:   [id number] MIRALAX   POWD (POLYETHYLENE GLYCOL 3350) As per prep  instructions.  #255 grams x 0   Entered by:   Hortense Ramal CMA   Authorized by:   [name] MD   Signed by:   Hortense Ramal CMA on 06/05/2008   Method used:   Electronically to        [facility] [phone]* (retail)       [address]       [place], [place]  [address]       Ph: [phone] or [phone]       Fax: [phone]   RxID:   [id number]

## 2010-05-13 NOTE — Assessment & Plan Note (Signed)
Summary: CPX / NWS  #   Vital Signs:  Patient profile:   40 year old female Height:      65 inches (165.10 cm) Weight:      288.0 pounds (130.91 kg) O2 Sat:      96 % on Room air Temp:     98.4 degrees F (36.89 degrees C) oral Pulse rate:   88 / minute BP sitting:   122 / 82  (left arm) Cuff size:   regular  Vitals Entered By: Orlan Leavens (August 06, 2009 8:17 AM)  O2 Flow:  Room air CC: CPX Is Patient Diabetic? No Pain Assessment Patient in pain? no        Primary Care Provider:  Newt Lukes MD  CC:  CPX.  History of Present Illness: patient is here today for annual physical. Patient feels well and has no complaints.   wants to know options to help with weight loss and PCOS - feels she is "not eating that much" and exercising approp but continued weight gain noted -  Preventive Screening-Counseling & Management  Alcohol-Tobacco     Alcohol drinks/day: <1     Alcohol Counseling: not indicated; use of alcohol is not excessive or problematic     Smoking Status: current     Tobacco Counseling: to quit use of tobacco products  Caffeine-Diet-Exercise     Diet Counseling: to improve diet; diet is suboptimal     Does Patient Exercise: no     Exercise Counseling: to improve exercise regimen  Clinical Review Panels:  Prevention   Last Pap Smear:  normal (04/11/2008)   Last Colonoscopy:  Location:  [facility].  (07/24/2008)  Immunizations   Last Tetanus Booster:  Historical (04/12/2007)  Lipid Management   Cholesterol:  215 (08/04/2009)   HDL (good cholesterol):  41.80 (08/04/2009)  CBC   WBC:  8.6 (08/04/2009)   RBC:  3.82 (08/04/2009)   Hgb:  11.6 (08/04/2009)   Hct:  35.0 (08/04/2009)   Platelets:  276.0 (08/04/2009)   MCV  91.8 (08/04/2009)   MCHC  33.2 (08/04/2009)   RDW  13.6 (08/04/2009)   PMN:  62.6 (08/04/2009)   Lymphs:  30.7 (08/04/2009)   Monos:  5.0 (08/04/2009)   Eosinophils:  1.2 (08/04/2009)   Basophil:  0.5  (08/04/2009)  Complete Metabolic Panel   Glucose:  80 (08/04/2009)   Sodium:  141 (08/04/2009)   Potassium:  4.3 (08/04/2009)   Chloride:  109 (08/04/2009)   CO2:  27 (08/04/2009)   BUN:  11 (08/04/2009)   Creatinine:  0.8 (08/04/2009)   Albumin:  3.7 (08/04/2009)   Total Protein:  6.4 (08/04/2009)   Calcium:  8.7 (08/04/2009)   Total Bili:  0.5 (08/04/2009)   Alk Phos:  72 (08/04/2009)   SGPT (ALT):  17 (08/04/2009)   SGOT (AST):  12 (08/04/2009)   Current Medications (verified): 1)  Albuterol 90 Mcg/act  Aers (Albuterol) .... Use As Directed As Needed 2)  Ibuprofen 200 Mg Tabs (Ibuprofen) .... As Needed 3)  Cpap Machine .... Use Nightly 4)  Paxil 20 Mg Tabs (Paroxetine Hcl) .[name].. 1 By Mouth Once Daily 5)  Tums 500 Mg Chew (Calcium Carbonate Antacid) .... Take 2 By Mouth Qd 6)  Tylenol Extra Strength 500 Mg Tabs (Acetaminophen) .... Take 1-2 As Needed 7)  Promethazine Hcl 25 Mg Tabs (Promethazine Hcl) .[name].. 1 By Mouth Every 4 Hours As Needed For Nausea  Allergies (verified): No Known Drug Allergies  Past History:  Past medical, surgical, family and social histories (including risk factors) reviewed, and no changes noted (except as noted below).  Past Medical History: Reviewed history from 05/25/2009 and no changes required. OBESITY  POLYCYSTIC OVARIAN DISEASE  OBSTRUCTIVE SLEEP APNEA - ASTHMA Diverticulosis Urinary Tract Infection, hx Depression/anxiety GERD  MD rooster: gyn -horvath pulm -[place]  Past Surgical History: Reviewed history from 01/28/2009 and no changes required. c-section x 2 (97 & 98) tubal ligation (1998) cocxy removal (1995) D& C   Family History: Reviewed history from 03/04/2008 and no changes required. Family History of Heart Disease: Maternal Grandmother Family History of Pancreatic Cancer:Maternal Grandmother (Diagnosed age 3) Family History of Prostate Cancer: Maternal Grandfather (diagnosed in 14's) Family History of Colon Polyps:  Maternal Grandfather Family History of Diabetes: Maternal Aunt  Social History: Reviewed history from 01/28/2009 and no changes required. Alcohol Use - yes-rare Illicit Drug Use - no Daily Caffeine Use Patient currently smokes.  lives with 2nd husband and 2 kids from [place]st [place] (1st husband expired 1998- leukemia) works as Lawyer (Dietitian) but curretly unemployed  Review of Systems       see HPI above. I have reviewed all other systems and they were negative.   Physical Exam  General:  overweight-appearing.  alert, well-developed, well-nourished, and cooperative to examination.   mom at side Eyes:  vision grossly intact; pupils equal, round and reactive to light.  conjunctiva and lids normal.    Ears:  normal pinnae bilaterally, without erythema, swelling, or tenderness to palpation. TMs clear, without effusion, or cerumen impaction. Hearing grossly normal bilaterally  Mouth:  teeth and gums in good repair; mucous membranes moist, without lesions or ulcers. oropharynx clear without exudate or erythema Neck:  thick, supple, full ROM, no masses, no thyromegaly; no thyroid nodules or tenderness. no JVD or carotid bruits.   Lungs:  normal respiratory effort, no intercostal retractions or use of accessory muscles; normal breath sounds bilaterally - no wheeze- no crackles    Heart:  normal rate, regular rhythm, no murmur, and no rub. BLE without edema. normal DP pulses and normal cap refill in all 4 extremities    Abdomen:  obese, soft, non-tender, normal bowel sounds, no distention; no masses and no appreciable hepatomegaly or splenomegaly.   Genitalia:  defer to gyn Msk:  No deformity or scoliosis noted of thoracic or lumbar spine.   Neurologic:  alert & oriented X3 and cranial nerves II-XII symetrically intact.  strength normal in all extremities, sensation intact to light touch, and gait normal. speech fluent without dysarthria or aphasia; follows commands with good  comprehension.  Skin:  no rashes, vesicles, ulcers, or erythema. No nodules or irregularity to palpation.  Psych:  Oriented X3, memory intact for recent and remote, normally interactive, good eye contact, not anxious appearing, not depressed appearing, and not agitated.      Impression & Recommendations:  Problem # 1:  PREVENTIVE HEALTH CARE (ICD-V70.0) Patient has been counseled on age-appropriate routine health concerns for screening and prevention. These are reviewed and up-to-date. Immunizations are up-to-date or declined. Labs reviewed.   Problem # 2:  OBESITY (ICD-278.00)  educated on need for diet mgmt and exercise to improve health and lose weight pt reports using Wii Zumba 4-5x/week refer to nutrition and provide number for [facility] health connect to enroll in bariatric clinic meeting 30d supply phenteramine given, educated on risk vs potential benefit - pt agrees to same  - f/u 4-6 weeks Ht: 65 (01/28/2009)   Wt: 282.2 (  01/28/2009)   BMI: 47.18 (06/05/2008)  Ht: 65 (08/06/2009)   Wt: 288.0 (08/06/2009)   BMI: 47.10 (05/25/2009)  Orders: Nutrition Referral (Nutrition)  Problem # 3:  POLYCYSTIC OVARIAN DISEASE (ICD-256.4) start metformin  Complete Medication List: 1)  Albuterol 90 Mcg/act Aers (Albuterol) .... Use as directed as needed 2)  Ibuprofen 200 Mg Tabs (Ibuprofen) .... As needed 3)  Cpap Machine  .... Use nightly 4)  Paxil 20 Mg Tabs (Paroxetine hcl) .[name].. 1 by mouth once daily 5)  Tums 500 Mg Chew (Calcium carbonate antacid) .... Take 2 by mouth qd 6)  Tylenol Extra Strength 500 Mg Tabs (Acetaminophen) .... Take 1-2 as needed 7)  Promethazine Hcl 25 Mg Tabs (Promethazine hcl) .[name].. 1 by mouth every 4 hours as needed for nausea 8)  Metformin Hcl 500 Mg Xr24h-tab (Metformin hcl) .[name].. 1 by mouth once daily 9)  Phentermine Hcl 37.5 Mg Caps (Phentermine hcl) .[name].. 1 by mouth qdd  Patient Instructions: 1)  it was good to see you today.  2)  labs reviewed today - 3)  to  help with weight and PCOS, start metformin and 30day supply of phenteramine as discussed -  4)  we'll make referral to nutrtionist at [place]. Our office will contact you regarding this appointment once made.  5)  you also need to call [phone] and ask to sign up for bariatric clinic and future meetings 6)  Please schedule a follow-up appointment in 4-6 weeks to review weight, sooner if problems.  Prescriptions: PHENTERMINE HCL 37.5 MG CAPS (PHENTERMINE HCL) 1 by mouth qdd  #30 x 0   Entered and Authorized by:   Newt Lukes MD   Signed by:   Newt Lukes MD on 08/06/2009   Method used:   Print then Give to Patient   RxID:   [id number] METFORMIN HCL 500 MG XR24H-TAB (METFORMIN HCL) 1 by mouth once daily  #30 x 1   Entered and Authorized by:   Newt Lukes MD   Signed by:   Newt Lukes MD on 08/06/2009   Method used:   Print then Give to Patient   RxID:   [id number] PAXIL 20 MG TABS (PAROXETINE HCL) 1 by mouth once daily  #90 x 3   Entered by:   Orlan Leavens   Authorized by:   Newt Lukes MD   Signed by:   Orlan Leavens on 08/06/2009   Method used:   Print then Give to Patient   RxID:   [id number]

## 2010-05-13 NOTE — Progress Notes (Signed)
Summary: Med Refill   Phone Note Call from Patient Call back at Home Phone [phone]   Caller: Patient Reason for Call: Refill Medication Details for Reason: med Refill Summary of Call: Pt requesting refill Paroxetine HCL 10 mg  [facility] Initial call taken by: Darryl Lent [facility],  October 15, 2008 1:42 PM  Follow-up for Phone Call        Rx sent. Follow-up by: Hortense Ramal CMA,  October 15, 2008 3:36 PM      Prescriptions: PAXIL 10 MG TABS (PAROXETINE HCL) Take 1 tab by mouth at bedtime  #30 x 5   Entered by:   Hortense Ramal CMA   Authorized by:   [name] MD   Signed by:   Hortense Ramal CMA on 10/15/2008   Method used:   Electronically to        [facility] [phone]* (retail)       [address]       [place], [place]  [address]       Ph: [phone] or [phone]       Fax: [phone]   RxID:   [id number]

## 2010-05-13 NOTE — Assessment & Plan Note (Signed)
Summary: EAR PAIN   FEVER---STC   Vital Signs:  Patient profile:   40 year old female Height:      65 inches (165.10 cm) Weight:      294 pounds (133.64 kg) O2 Sat:      97 % on Room air Temp:     98.3 degrees F (36.83 degrees C) oral Pulse rate:   75 / minute BP sitting:   110 / 84  (left arm) Cuff size:   large  Vitals Entered By: Orlan Leavens RMA (January 20, 2010 9:56 AM)  O2 Flow:  Room air CC: (R) ear pain & fever, Ear pain Is Patient Diabetic? No Pain Assessment Patient in pain? yes     Location: (R) ear Type: sharp Onset of pain  pt states x's 2 days. Pain radiates up to her head/temple   Primary Care Provider:  Newt Lukes MD  CC:  (R) ear pain & fever and Ear pain.  History of Present Illness: Ear Pain      This is a 40 year old woman who presents with Ear pain.  The symptoms began 5 days ago.  The severity is described as moderate.  same symptoms 2 weeks ago but spont resolution x 1 week, then return of pain across right side of head, worst at ear.  The patient denies ear discharge, sensation of fullness, hearing loss, tinnitus, fever, sinus pain, nasal discharge, and jaw click.  The pain is located in the right ear and behind the right ear.  The pain is described as constant.  Associated symptoms include headache.  The patient denies popping or crackling sounds, pressure, toothache, dizziness, and vertigo.  Patient's history includes chronic ear infections and hay fever/allergies.  Prior treatment has included topical antibiotics.    reviewed chronic med issues:  depression/anxiety-  denies hx same - med started for stomach problems by GI- on paxil for >13mo w/o change - wants to wean off med b/c doesn't feel med needed - c/o excess fatigue and sleeping 10-12h/d denies signs depression - no sleeping or mood or irritability problems but reports feeling very nervous since dx of mom with breast cancer 11/2009 symptoms improved with xanax as needed, rare  use  PCOS - never started metformin - following with gyn only annually - only 1 period in last 6 mos  obesity - weight gain noted - never started phentermine - ?bariatric clinic for "vitamins and B12 shots"  Current Medications (verified): 1)  Albuterol 90 Mcg/act  Aers (Albuterol) .... Use As Directed As Needed 2)  Ibuprofen 200 Mg Tabs (Ibuprofen) .... As Needed 3)  Cpap Machine .... Use Nightly 4)  Tums 500 Mg Chew (Calcium Carbonate Antacid) .... Take 2 By Mouth Qd 5)  Tylenol Extra Strength 500 Mg Tabs (Acetaminophen) .... Take 1-2 As Needed 6)  Promethazine Hcl 25 Mg Tabs (Promethazine Hcl) .[name].. 1 By Mouth Every 4 Hours As Needed For Nausea 7)  Alprazolam 0.5 Mg Tabs (Alprazolam) .... 1/2-1 By Mouth Every 8 Hours As Needed For Anxiety or Panic Symptoms  Allergies (verified): No Known Drug Allergies  Past History:  Past Medical History: OBESITY  POLYCYSTIC OVARIAN DISEASE  OBSTRUCTIVE SLEEP APNEA - ASTHMA Diverticulosis Urinary Tract Infection, hx Depression/anxiety GERD    MD roster: gyn -horvath pulm -[facility] - d brodie  Family History: Family History of Heart Disease: Maternal Grandmother Family History of Pancreatic Cancer:Maternal Grandmother (Diagnosed age 78) Family History of Prostate Cancer: Maternal Grandfather (diagnosed in 19's) Family  History of Colon Polyps: Maternal Grandfather Family History of Diabetes: Maternal Aunt  mom dx breast cancer 11/2009  Social History: Alcohol Use - yes-rare Illicit Drug Use - no  Daily Caffeine Use Patient currently smokes.  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    Physical Exam  General:  overweight-appearing.  alert, well-developed, well-nourished, and cooperative to examination.   spouse at  side Eyes:  vision grossly intact; pupils equal, round and reactive to light.  conjunctiva and lids normal.    Ears:  R earlobe with small inflammed nodule, no fluctuence or drainage -normal left pinna, without erythema, swelling, or tenderness to palpation.  B TMs clear, without effusion, or cerumen impaction. Hearing grossly normal bilaterally  Mouth:  teeth and gums in good repair; mucous membranes moist, small apthous ulcer lesion right lower gum line. oropharynx clear without exudate or erythema Lungs:  normal respiratory effort, no intercostal retractions or use of accessory muscles; normal breath sounds bilaterally - no wheeze- no crackles    Heart:  normal rate, regular rhythm, no murmur, and no rub. BLE without edema.   Impression & Recommendations:  Problem # 1:  EAR PAIN, RIGHT (ICD-388.70)  inflammed earlobe nodule, no abscess appreciated on exam - tx septra x 7 d -erx done  Discussed symptom control.   Her updated medication list for this problem includes:    Smz-tmp Ds 800-160 Mg Tabs (Sulfamethoxazole-trimethoprim) .[name]... 1 by mouth two times a day x 7 days  Orders: Prescription Created Electronically [phone])  Problem # 2:  COLD SORE (ICD-054.9)  viral flare related to chronic condition and acute illness - cont topical and valtrex as needed   Complete Medication List: 1)  Albuterol 90 Mcg/act Aers (Albuterol) .... Use as directed as needed 2)  Ibuprofen 200 Mg Tabs (Ibuprofen) .... As needed 3)  Cpap Machine  .... Use nightly 4)  Tums 500 Mg Chew (Calcium carbonate antacid) .... Take 2 by mouth qd 5)  Tylenol Extra Strength 500 Mg Tabs (Acetaminophen) .... Take 1-2 as needed 6)  Promethazine Hcl 25 Mg Tabs (Promethazine hcl) .[name].. 1 by mouth every 4 hours as needed for nausea 7)  Alprazolam 0.5 Mg Tabs (Alprazolam) .... 1/2-1 by mouth every 8 hours as needed for anxiety or panic symptoms 8)  Smz-tmp Ds 800-160 Mg Tabs (Sulfamethoxazole-trimethoprim) .[name].. 1 by mouth  two times a day x 7 days 9)  Nystatin 100000 Unit/ml Susp (Nystatin) .... Swish and spit 10cc four times a day x 7 days, then as needed  Patient Instructions: 1)  it was good to see you today. 2)  septra for earlobe infection and nystatin swish and spit for gum sore - your prescriptions have been electronically submitted to your pharmacy. Please take as directed. Contact our office if you believe you're having problems with the medication(s).  3)  Get plenty of rest, drink lots of clear liquids, and use Tylenol or Ibuprofen for fever and comfort. Return in 7-10 days (as scheduled):sooner if you're feeling worse. Prescriptions: NYSTATIN 100000 UNIT/ML SUSP (NYSTATIN) swish and spit 10cc four times a day x 7 days, then as needed  #500cc x 0   Entered and Authorized by:   Newt Lukes MD   Signed by:   Newt Lukes MD on 01/20/2010   Method  used:   [facility] [phone][facility] (retail)       [address]       [place], [place]  [address]       Ph: [phone] or [phone]       Fax: [phone]   RxID:   [id number] SMZ-TMP DS 800-160 MG TABS (SULFAMETHOXAZOLE-TRIMETHOPRIM) 1 by mouth two times a day x 7 days  #14 x 0   Entered and Authorized by:   Newt Lukes MD   Signed by:   Newt Lukes MD on 01/20/2010   Method used:   [facility] to        [facility] [phone]* (retail)       [address]       [place], [place]  [address]       Ph: [phone] or [phone]       Fax: [phone]   RxID:   [id number]

## 2010-05-14 NOTE — Letter (Signed)
Summary: Office Notes Dated 04-26-04 Thru 09-17-04/[facility]  Office Notes Dated 04-26-04 Thru 09-17-04/[facility]   Imported By: Lanelle Bal 04/21/2008 10:23:33  _____________________________________________________________________  External Attachment:    Type:   Image     Comment:   External Document

## 2010-06-11 ENCOUNTER — Encounter: Payer: Self-pay | Admitting: Internal Medicine

## 2010-06-11 ENCOUNTER — Ambulatory Visit (INDEPENDENT_AMBULATORY_CARE_PROVIDER_SITE_OTHER): Payer: 59 | Admitting: Internal Medicine

## 2010-06-11 DIAGNOSIS — J209 Acute bronchitis, unspecified: Secondary | ICD-10-CM

## 2010-06-17 NOTE — Assessment & Plan Note (Signed)
Summary: SORE THROAT/ CONGESTION /NWS   Vital Signs:  Patient profile:   40 year old female O2 Sat:      95 % on Room air Temp:     98.6 degrees F (37.00 degrees C) oral Pulse rate:   82 / minute BP sitting:   102 / 76  (left arm) Cuff size:   large  Vitals Entered By: Orlan Leavens RMA (June 11, 2010 8:50 AM)  O2 Flow:  Room air CC: Congestion & sore throat Is Patient Diabetic? No Pain Assessment Patient in pain? no        Primary Care Provider:  Newt Lukes MD  CC:  Congestion & sore throat.  History of Present Illness: c/o sinus and chest congestion onset days ago - a/w ST, mild HA and fatigue no fever; scant but thick sputum  not improved with OTC cough meds  also reviewed chronic med issues: depression/anxiety-  denies hx same - med started for stomach problems by GI- on paxil for >17mo in 2011 w/o change - wean off med 12/2009 feels more irritability since being off med c/o continued excess fatigue and sleeping 10-12h/d but reports feeling very nervous since dx of mom with breast cancer 11/2009 symptoms improved with xanax as needed, rare use  PCOS - never started metformin - following with gyn only annually - only 1 period in last 6 mos  obesity - weight gain noted - never started phentermine - ?bariatric clinic for "vitamins and B12 shots"  Current Medications (verified): 1)  Albuterol 90 Mcg/act  Aers (Albuterol) .... Use As Directed As Needed 2)  Ibuprofen 200 Mg Tabs (Ibuprofen) .... As Needed 3)  Cpap Machine .... Use Nightly 4)  Tums 500 Mg Chew (Calcium Carbonate Antacid) .... Take 2 By Mouth Qd 5)  Tylenol Extra Strength 500 Mg Tabs (Acetaminophen) .... Take 1-2 As Needed 6)  Promethazine Hcl 25 Mg Tabs (Promethazine Hcl) .[name].. 1 By Mouth Every 4 Hours As Needed For Nausea 7)  Alprazolam 0.5 Mg Tabs (Alprazolam) .... 1/2-1 By Mouth Every 8 Hours As Needed For Anxiety or Panic Symptoms 8)  Paroxetine Hcl 10 Mg Tabs (Paroxetine Hcl) .[name].. 1 By  Mouth Once Daily  Allergies (verified): No Known Drug Allergies  Past History:  Past Medical History: OBESITY  POLYCYSTIC OVARIAN DISEASE  OBSTRUCTIVE SLEEP APNEA ASTHMA Diverticulosis Urinary Tract Infection,  hx Depression/anxiety GERD    MD roster: gyn -horvath pulm -[facility] - d brodie  Review of Systems  The patient denies decreased hearing, hoarseness, dyspnea on exertion, and hemoptysis.    Physical Exam  General:  overweight-appearing.  alert, well-developed, well-nourished, and cooperative to examination.   spouse at side Eyes:  vision grossly intact; pupils equal, round and reactive to light.  conjunctiva and lids normal.    Ears:  normal pinnae bilaterally, without erythema, swelling, or tenderness to palpation. TMs clear, without effusion, or cerumen impaction. Hearing grossly normal bilaterally  Mouth:  teeth and gums in good repair; mucous membranes moist, without lesions or ulcers. oropharynx clear without exudate, mod erythema. thick sputum in OP Lungs:  normal respiratory effort, no intercostal retractions or use of accessory muscles; few rhonchi breath sounds bilaterally - no wheeze- no crackles    Heart:  normal rate, regular rhythm, no murmur, and no rub. BLE without edema.   Impression & Recommendations:  Problem # 1:  ACUTE BRONCHITIS (ICD-466.0)  Her updated medication list for this problem includes:    Proair Hfa 108 (90  Base) Mcg/act Aers (Albuterol sulfate) .[name]... 2 puffs every 4 hours as needed for shortness of breath    Azithromycin 250 Mg Tabs (Azithromycin) .[name]... 2 tabs by mouth today, then 1 by mouth daily starting tomorrow    Hydromet 5-1.5 Mg/68ml Syrp (Hydrocodone-homatropine) .Marland KitchenMarland KitchenMarland KitchenMarland Kitchen 5 cc by mouth every 4 hours as needed for cough symptoms  Take antibiotics and other medications as directed. Encouraged to push clear liquids, get enough rest, and take acetaminophen as needed. To be seen in 5-7 days if no improvement, sooner if  worse.  Orders: Prescription Created Electronically [id number])  Complete Medication List: 1)  Proair Hfa 108 (90 Base) Mcg/act Aers (Albuterol sulfate) .... 2 puffs every 4 hours as needed for shortness of breath 2)  Ibuprofen 200 Mg Tabs (Ibuprofen) .... As needed 3)  Cpap Machine  .... Use nightly 4)  Tums 500 Mg Chew (Calcium carbonate antacid) .... Take 2 by mouth qd 5)  Tylenol Extra Strength 500 Mg Tabs (Acetaminophen) .... Take 1-2 as needed 6)  Promethazine Hcl 25 Mg Tabs (Promethazine hcl) .[name].. 1 by mouth every 4 hours as needed for nausea 7)  Alprazolam 0.5 Mg Tabs (Alprazolam) .... 1/2-1 by mouth every 8 hours as needed for anxiety or panic symptoms 8)  Paroxetine Hcl 10 Mg Tabs (Paroxetine hcl) .[name].. 1 by mouth once daily 9)  Azithromycin 250 Mg Tabs (Azithromycin) .... 2 tabs by mouth today, then 1 by mouth daily starting tomorrow 10)  Hydromet 5-1.5 Mg/21ml Syrp (Hydrocodone-homatropine) .... 5 cc by mouth every 4 hours as needed for cough symptoms  Patient Instructions: 1)  it was good to see you today. 2)  Zpak and prescription cough syrup - also refill on albuterol inhaler - your prescriptions have been submitted to your pharmacy. Please take as directed. Contact our office if you believe you're having problems with the medication(s).  3)  Get plenty of rest, drink lots of clear liquids, and use Tylenol or Ibuprofen for fever and comfort. Return in 7-10 days if you're not better:sooner if you're feeling worse. 4)  Please schedule a follow-up appointmen to monitor depression and anxiey symptoms, call sooner if problems.   Prescriptions: PROAIR HFA 108 (90 BASE) MCG/ACT AERS (ALBUTEROL SULFATE) 2 puffs every 4 hours as needed for shortness of breath  #1 x 1   Entered and Authorized by:   Newt Lukes MD   Signed by:   Newt Lukes MD on 06/11/2010   Method used:   [facility] to        [facility] [phone]* (retail)       [address]        [place], [place]  [address]       Ph: [phone] or [phone]       Fax: [phone]   RxID:   [id number] HYDROMET 5-1.5 MG/5ML SYRP (HYDROCODONE-HOMATROPINE) 5 cc by mouth every 4 hours as needed for cough symptoms  #8 oz x 0   Entered and Authorized by:   Newt Lukes MD   Signed by:   Newt Lukes MD on 06/11/2010   Method used:   Printed then faxed to ...       [facility]  [address] [phone]* (retail)       [address]       [place], [place]  [address]       Ph:  [phone] or [phone]       Fax: [phone]   RxID:   [id number] AZITHROMYCIN 250 MG TABS (AZITHROMYCIN) 2 tabs by mouth today, then 1 by mouth daily starting tomorrow  #6 x 0   Entered and Authorized by:   Newt Lukes MD   Signed by:   Newt Lukes MD on 06/11/2010   Method used:   Electronically to        [facility] [phone]* (retail)       [address]       [place], [place]  [address]       Ph: [phone] or [phone]       Fax: [phone]   RxID:   [id number]    Orders Added: 1)  Est. Patient Level IV [17616] 2)  Prescription Created Electronically [phone]

## 2010-07-29 ENCOUNTER — Other Ambulatory Visit: Payer: Self-pay | Admitting: Obstetrics and Gynecology

## 2010-08-02 ENCOUNTER — Emergency Department (HOSPITAL_COMMUNITY): Payer: 59

## 2010-08-02 ENCOUNTER — Emergency Department (HOSPITAL_COMMUNITY)
Admission: EM | Admit: 2010-08-02 | Discharge: 2010-08-02 | Disposition: A | Discharge status: Home / Self Care | Payer: 59 | Attending: Emergency Medicine | Admitting: Emergency Medicine

## 2010-08-02 DIAGNOSIS — E282 Polycystic ovarian syndrome: Secondary | ICD-10-CM | POA: Insufficient documentation

## 2010-08-02 DIAGNOSIS — K3184 Gastroparesis: Secondary | ICD-10-CM | POA: Insufficient documentation

## 2010-08-02 DIAGNOSIS — R040 Epistaxis: Secondary | ICD-10-CM | POA: Insufficient documentation

## 2010-08-02 DIAGNOSIS — J189 Pneumonia, unspecified organism: Secondary | ICD-10-CM | POA: Insufficient documentation

## 2010-08-02 DIAGNOSIS — J45909 Unspecified asthma, uncomplicated: Secondary | ICD-10-CM | POA: Insufficient documentation

## 2010-08-02 DIAGNOSIS — Z79899 Other long term (current) drug therapy: Secondary | ICD-10-CM | POA: Insufficient documentation

## 2010-08-02 LAB — BASIC METABOLIC PANEL
BUN: 8 mg/dL (ref 6–23)
CO2: 25 mEq/L (ref 19–32)
Calcium: 8.7 mg/dL (ref 8.4–10.5)
Chloride: 103 mEq/L (ref 96–112)
Creatinine, Ser: 0.77 mg/dL (ref 0.4–1.2)
GFR calc Af Amer: >60 mL/min (ref >60)
GFR calc non Af Amer: >60 mL/min (ref >60)
Glucose, Bld: 98 mg/dL (ref 70–99)
Potassium: 3.8 mEq/L (ref 3.5–5.1)
Sodium: 136 mEq/L (ref 135–145)

## 2010-08-02 LAB — DIFFERENTIAL
Basophils Absolute: 0 10*3/uL (ref 0.0–0.1)
Basophils Relative: 0 % (ref 0–1)
Eosinophils Absolute: 0.2 10*3/uL (ref 0.0–0.7)
Eosinophils Relative: 1 % (ref 0–5)
Lymphocytes Relative: 35 % (ref 12–46)
Lymphs Abs: 3.8 10*3/uL (ref 0.7–4.0)
Monocytes Absolute: 0.7 10*3/uL (ref 0.1–1.0)
Monocytes Relative: 6 % (ref 3–12)
Neutro Abs: 6.1 10*3/uL (ref 1.7–7.7)
Neutrophils Relative %: 57 % (ref 43–77)

## 2010-08-02 LAB — CBC
HCT: 38 % (ref 36.0–46.0)
Hemoglobin: 12.6 g/dL (ref 12.0–15.0)
MCH: 30.4 pg (ref 26.0–34.0)
MCHC: 33.2 g/dL (ref 30.0–36.0)
MCV: 91.6 fL (ref 78.0–100.0)
Platelets: 312 10*3/uL (ref 150–400)
RBC: 4.15 MIL/uL (ref 3.87–5.11)
RDW: 13 % (ref 11.5–15.5)
WBC: 10.8 10*3/uL — ABNORMAL HIGH (ref 4.0–10.5)

## 2010-08-06 ENCOUNTER — Other Ambulatory Visit (INDEPENDENT_AMBULATORY_CARE_PROVIDER_SITE_OTHER): Payer: 59

## 2010-08-06 ENCOUNTER — Other Ambulatory Visit: Payer: Self-pay | Admitting: Internal Medicine

## 2010-08-06 DIAGNOSIS — Z0000 Encounter for general adult medical examination without abnormal findings: Secondary | ICD-10-CM

## 2010-08-06 LAB — BASIC METABOLIC PANEL
BUN: 12 mg/dL (ref 6–23)
CO2: 23 mEq/L (ref 19–32)
Calcium: 9 mg/dL (ref 8.4–10.5)
Chloride: 108 mEq/L (ref 96–112)
Creatinine, Ser: 0.7 mg/dL (ref 0.4–1.2)
GFR: 93.06 mL/min (ref >60.00)
Glucose, Bld: 88 mg/dL (ref 70–99)
Potassium: 4.1 mEq/L (ref 3.5–5.1)
Sodium: 138 mEq/L (ref 135–145)

## 2010-08-06 LAB — CBC WITH DIFFERENTIAL/PLATELET
Basophils Absolute: 0.1 10*3/uL (ref 0.0–0.1)
Basophils Relative: 0.9 % (ref 0.0–3.0)
Eosinophils Absolute: 0.1 10*3/uL (ref 0.0–0.7)
Eosinophils Relative: 1.3 % (ref 0.0–5.0)
HCT: 38.2 % (ref 36.0–46.0)
Hemoglobin: 13.1 g/dL (ref 12.0–15.0)
Lymphocytes Relative: 30.8 % (ref 12.0–46.0)
Lymphs Abs: 3 10*3/uL (ref 0.7–4.0)
MCHC: 34.3 g/dL (ref 30.0–36.0)
MCV: 90.7 fl (ref 78.0–100.0)
Monocytes Absolute: 0.3 10*3/uL (ref 0.1–1.0)
Monocytes Relative: 3 % (ref 3.0–12.0)
Neutro Abs: 6.1 10*3/uL (ref 1.4–7.7)
Neutrophils Relative %: 64 % (ref 43.0–77.0)
Platelets: 293 10*3/uL (ref 150.0–400.0)
RBC: 4.21 Mil/uL (ref 3.87–5.11)
RDW: 12.9 % (ref 11.5–14.6)
WBC: 9.6 10*3/uL (ref 4.5–10.5)

## 2010-08-06 LAB — URINALYSIS
Bilirubin Urine: NEGATIVE
Hgb urine dipstick: NEGATIVE
Ketones, ur: NEGATIVE
Leukocytes, UA: NEGATIVE
Nitrite: NEGATIVE
Specific Gravity, Urine: >=1.03 (ref 1.000–1.030)
Total Protein, Urine: NEGATIVE
Urine Glucose: NEGATIVE
Urobilinogen, UA: 0.2 (ref 0.0–1.0)
pH: 5.5 (ref 5.0–8.0)

## 2010-08-06 LAB — LIPID PANEL
Cholesterol: 207 mg/dL — ABNORMAL HIGH (ref 0–200)
HDL: 39 mg/dL — ABNORMAL LOW (ref >39.00)
Total CHOL/HDL Ratio: 5
Triglycerides: 147 mg/dL (ref 0.0–149.0)
VLDL: 29.4 mg/dL (ref 0.0–40.0)

## 2010-08-06 LAB — HEPATIC FUNCTION PANEL
ALT: 13 U/L (ref 0–35)
AST: 9 U/L (ref 0–37)
Albumin: 3.6 g/dL (ref 3.5–5.2)
Alkaline Phosphatase: 70 U/L (ref 39–117)
Bilirubin, Direct: 0.1 mg/dL (ref 0.0–0.3)
Total Bilirubin: 0.3 mg/dL (ref 0.3–1.2)
Total Protein: 7 g/dL (ref 6.0–8.3)

## 2010-08-06 LAB — TSH: TSH: 3.72 u[IU]/mL (ref 0.35–5.50)

## 2010-08-06 LAB — LDL CHOLESTEROL, DIRECT: Direct LDL: 149.7 mg/dL

## 2010-08-10 ENCOUNTER — Encounter: Payer: Self-pay | Admitting: Internal Medicine

## 2010-08-11 ENCOUNTER — Ambulatory Visit (INDEPENDENT_AMBULATORY_CARE_PROVIDER_SITE_OTHER): Payer: 59 | Admitting: Internal Medicine

## 2010-08-11 ENCOUNTER — Encounter: Payer: Self-pay | Admitting: Internal Medicine

## 2010-08-11 VITALS — BP 112/82 | HR 89 | Temp 98.2°F | Ht 65.0 in | Wt 296.0 lb

## 2010-08-11 DIAGNOSIS — G4733 Obstructive sleep apnea (adult) (pediatric): Secondary | ICD-10-CM

## 2010-08-11 DIAGNOSIS — E282 Polycystic ovarian syndrome: Secondary | ICD-10-CM

## 2010-08-11 DIAGNOSIS — K219 Gastro-esophageal reflux disease without esophagitis: Secondary | ICD-10-CM

## 2010-08-11 DIAGNOSIS — E669 Obesity, unspecified: Secondary | ICD-10-CM

## 2010-08-11 DIAGNOSIS — K3189 Other diseases of stomach and duodenum: Secondary | ICD-10-CM

## 2010-08-11 DIAGNOSIS — Z0000 Encounter for general adult medical examination without abnormal findings: Secondary | ICD-10-CM

## 2010-08-11 MED ORDER — METFORMIN HCL ER 500 MG PO TB24: 500.0000 mg | ORAL_TABLET | Freq: Every day | ORAL | Status: DC

## 2010-08-11 MED ORDER — FAMOTIDINE 20 MG PO TABS: 20.0000 mg | ORAL_TABLET | Freq: Every day | ORAL | Status: DC

## 2010-08-11 NOTE — Patient Instructions (Addendum)
It was good to see you today. Start metformin as discussed - Your prescription(s) have been submitted to your pharmacy. Please take as directed and contact our office if you believe you are having problem(s) with the medication(s). Use OTC pepcid x 30days for your stomach - call Dr. Juanda Chance if worsening pain or nausea and vomiting  Work on lifestyle changes as discussed (low fat, low carb, increased protein diet; improved exercise efforts; weight loss) to control sugar, blood pressure and cholesterol levels and/or reduce risk of developing other medical problems. Look into LimitLaws.com.cy or other type of food journal to assist you in this process. Other options include weight watchers or consideration of bariatric surgery like lap band (call [phone] for information about educational meetings if interested) Please schedule followup in 6-8 weeks for weight check and diet review, call sooner if problems.

## 2010-08-11 NOTE — Assessment & Plan Note (Signed)
Follows with gyn for same - still ammonirrhea -  Will start low dose metformin - pt agreeable to same

## 2010-08-11 NOTE — Progress Notes (Signed)
Subjective:    Patient ID: [name], female    DOB: June 19, 1970, 40 y.o.   MRN: [id number]  HPI patient is here today for annual physical. Patient feels well and has no complaints.  Also reviewed chronic medical issues: depression/anxiety-  denies hx same - med started for stomach problems by GI- on paxil for >69mo in 2011 w/o change - wean off med 12/2009 Felt more irritable off med so resumed same early 2012 continued excess fatigue and sleeping 10-12h/d exac with mom dx breast cancer 11/2009 - "but everything ok now" symptoms improved with xanax as needed, rare use  PCOS - never started metformin - following with gyn for same - only 1 period in last 12 mos  obesity - weight gain noted - never started phentermine - ?bariatric clinic for "vitamins and shots" - has [employer] membership but doesn't utilize  GERD and gastroparesis - dx 2006 on GES and has seen GI for same - continued abd pain, worse with meals - nausea and vomiting if eat too much or too fast. No bowel changes, +weight gain, no loss - not taking rx or otc meds for reflux burning  OSA - reports compliance with CPAP but continued fatigue - see above  Past Medical History  Diagnosis Date  . POLYCYSTIC OVARIAN DISEASE   . OBESITY   . SMOKER   . OBSTRUCTIVE SLEEP APNEA   . DELAYED GASTRIC EMPTYING   . DEPRESSION   . GERD   . Anxiety   . ASTHMA    Family History  Problem Relation Age of Onset  . Breast cancer Mother     dx 11/2009  . Diabetes Maternal Aunt   . Prostate cancer Maternal Grandfather 60    also hx colon polyps  . Heart disease Paternal Grandmother   . Pancreatic cancer Paternal Grandmother 54   History  Substance Use Topics  . Smoking status: Current Everyday Smoker  . Smokeless tobacco: Not on file   Comment: Lives with husband & 2 kids from 1st marriage. (1st husband expired 1998-leukemia) work as Engineer, structural, but currently unemployed  . Alcohol Use: Yes     Rarely    Review of  Systems  Constitutional: Negative for fever.  Respiratory: Negative for cough and shortness of breath.   Cardiovascular: Negative for chest pain.  Gastrointestinal: Negative for abdominal pain.  Musculoskeletal: Negative for gait problem.  Skin: Negative for rash.  Neurological: Negative for dizziness.  No other specific complaints in a complete review of systems (except as listed in HPI above).     Objective:   Physical Exam BP 112/82  Pulse 89  Temp(Src) 98.2 F (36.8 C) (Oral)  Ht 5\' 5"  (1.651 m)  Wt 296 lb (134.265 kg)  BMI 49.26 kg/m2  SpO2 97% Physical Exam  Constitutional: She is obese; oriented to person, place, and time. She appears well-developed and well-nourished. No distress.  HENT: Head: Normocephalic and atraumatic. Ears: B TMs clear, hearing grossly normal; Nose: Nose normal.  Mouth/Throat: Oropharynx is clear and moist. No oropharyngeal exudate.  Eyes: Conjunctivae and EOM are normal. Pupils are equal, round, and reactive to light. No scleral icterus.  Neck: Normal range of motion. Neck supple. No JVD present. No thyromegaly present.  Cardiovascular: Normal rate, regular rhythm and normal heart sounds.  No murmur heard. Pulmonary/Chest: Effort normal and breath sounds normal. No respiratory distress. She has no wheezes.  Abdominal: Soft. Bowel sounds are normal. She exhibits no distension. There is no  tenderness.  GU: defer to gyn Musculoskeletal: Normal range of motion. She exhibits no edema.  Neurological: She is alert and oriented to person, place, and time. No cranial nerve deficit. Coordination normal.  Skin: Skin is warm and dry. No rash noted. No erythema.  Psychiatric: She has a normal mood and affect. Her behavior is normal. Judgment and thought content normal.   Lab Results  Component Value Date   WBC 9.6 08/06/2010   HGB 13.1 08/06/2010   HCT 38.2 08/06/2010   PLT 293.0 08/06/2010   CHOL 207* 08/06/2010   TRIG 147.0 08/06/2010   HDL 39.00* 08/06/2010     LDLDIRECT 149.7 08/06/2010   ALT 13 08/06/2010   AST 9 08/06/2010   NA 138 08/06/2010   K 4.1 08/06/2010   CL 108 08/06/2010   CREATININE 0.7 08/06/2010   BUN 12 08/06/2010   CO2 23 08/06/2010   TSH 3.72 08/06/2010        Assessment & Plan:  CPX - v70.0 - Patient has been counseled on age-appropriate routine health concerns for screening and prevention. These are reviewed and up-to-date. Immunizations are up-to-date or declined. Labs reviewed.  Also see problem list. Medications and labs reviewed today.

## 2010-08-11 NOTE — Assessment & Plan Note (Signed)
Long hx same including prior GI eval - last GES 2006 reviewed - Continued abdominal discomfort with meals and nausea and vomiting symptoms  Recommended pepcid x 30d and follow up GI as needed (d brodie)

## 2010-08-11 NOTE — Assessment & Plan Note (Signed)
OTC h2b x 30d recommended (see Delayed gastric emptying)

## 2010-08-11 NOTE — Assessment & Plan Note (Signed)
Wt Readings from Last 3 Encounters:  08/11/10 296 lb (134.265 kg)  01/27/10 292 lb 6.4 oz (132.632 kg)  01/20/10 294 lb (133.358 kg)   Obstacles related to health reviewed in depth including options for weight loss - pt will consider and work on diet/exercise efforts follow up 6-8 weeks to continue accountability and counseling

## 2010-08-11 NOTE — Assessment & Plan Note (Signed)
Remains on CPAP - would likely improved if successful weight loss maintained follow up dr. Craige Cotta as planned/as needed

## 2010-08-12 ENCOUNTER — Other Ambulatory Visit: Payer: Self-pay | Admitting: Obstetrics and Gynecology

## 2010-08-23 ENCOUNTER — Other Ambulatory Visit: Payer: Self-pay | Admitting: Internal Medicine

## 2010-08-24 NOTE — Op Note (Signed)
[name], [name]                  ACCOUNT NO.:  [id number]   MEDICAL RECORD NO.:  [id number]          PATIENT TYPE:  AMB   LOCATION:  SDC                           FACILITY:  [facility]   PHYSICIAN:  Carrington Clamp, M.D. DATE OF BIRTH:  05-15-1970   DATE OF PROCEDURE:  06/20/2007  DATE OF DISCHARGE:                               OPERATIVE REPORT   PREOPERATIVE DIAGNOSIS:  Menorrhagia alternating with oligomenorrhea  with right lower quadrant pain.   POSTOPERATIVE DIAGNOSIS:  Menorrhagia alternating with oligomenorrhea  with right lower quadrant pain, omental adhesions.   PROCEDURES:  Diagnostic laparoscopy with lysis of adhesions and  dilation, curettage and hysteroscopy.   SURGEON:  Carrington Clamp, M.D.   ASSISTANT:  Luvenia Redden, M.D.   ANESTHESIA:  General   FINDINGS:  Normal uterus, tubes and ovaries seen.  There is no evidence  of endometriosis.  There were omental adhesions extensively through the  anterior abdominal wall extending all way to the anterior portion of the  left round ligament.  The liver edge appeared normal, bowel appeared  normal.  The appendix was not visualized but there was no adhesions in  that area seen and cul-de-sac was clear.   SPECIMENS:  None.   ESTIMATED BLOOD LOSS:  50 mL.   URINE OUTPUT:  Not measured.   IV FLUIDS:  1700 mL.   COMPLICATIONS:  None.   MEDICATIONS:  Interceed.   COUNTS:  Correct x3.   TECHNIQUE:  After adequate general anesthesia was achieved, the patient  was prepped, draped in usual sterile fashion, in dorsal lithotomy  position.  A 2 cm infraumbilical incision was made with the scalpel and  then the Veress needle passed into the abdomen without aspiration of  bowel contents or blood.  The abdomen is insufflated and a 10 mL trocar  placed through to the incision.  A second 5 mm trocar was placed  suprapubically after some omental adhesions of been taken down with pair  of scissors through the operative scope.   This was placed under direct  visualization of the camera.  The omental adhesions were extremely thick  at the abdominal wall.  They extended into a narrow portion.  They were  confirmed as being strictly omental by looking through the 5 mm scope  from below as well as the 10 mL scope from above to ensure there was no  bowel in the adhesions and this was confirmed.  This was further  confirmed by the fact that the omental fat was much paler than the  epiploica fat which was clearly not involved with the omental adhesions  stuck to the anterior abdominal wall.  Alternating the operative scope  and the regular scope, both sharp dissection with the unipolar cautery  through the scissors and dissection with the triple polar was achieved  to remove the omental adhesion as best as possible from the anterior  abdominal wall.  A portion of the omentum had to be left on the anterior  abdominal wall because of the thickness of the area and the  impossibility of being able  to remove it through the scope without doing  significant damage to the peritoneum.  However it was felt that the  disconnection of the omentum from this adhesions would afford the  patient relief from her right lower quadrant pain as this had extended  down from the mid right epigastric to the right lower quadrant.  Bleeding was minimal and hemostasis was had been achieved on both the  adhesion that had been left against the abdominal wall and also the  omentum that had been taken down.     A piece of Interceed was then introduced into the abdomen and pulled  forward over the uterus and over a portion of the bowels superiorly in  order to try and prevent the anterior omental adhesion that was left on  the peritoneum from readhering to the other structures.  There was no  other abnormalities seen in the pelvis and the small anterior adhesion  to the left round ligament was also taken down with sharp dissection.   All instruments  were withdrawn from the abdomen.  The abdomen  desufflated.  The attention was then turned to the vagina where the  cervix was grasped with a single-tooth tenaculum after the uterine  manipulator had been removed.  The uterine manipulator and red rubber  catheter had been placed prior to the infraumbilical incision on the  abdomen.  The cervix was then dilated with [facility] dilators.  The  hysteroscope passed into the uterus.  A shaggy lush endometrium was seen  with possible some posterior polyps but no other major abnormalities  seen.  A sharp curettage was then performed in order to remove as much  tissue as possible to be checked by the pathologist.  The ostia could  not be definitively identified but the rest of the uterine cavity and  cervix appeared normal.  Once the adequate D&C was achieved, all  instruments withdrawn from the vagina.   After gloves were changed, the 2 cm infraumbilical incision fascial site  was closed with a figure-of-eight stitch of 2-0 Vicryl.  The 5 mm trocar  site skin incision was closed with two through-and-through stitches of 3-  0 Vicryl Rapide.  The skin incisions were then closed further with  Dermabond.  The patient tolerated the procedure well and was returned to  recovery room in stable good condition.      Carrington Clamp, M.D.  Electronically Signed     MH/MEDQ  D:  06/20/2007  T:  06/21/2007  Job:  [id number]

## 2010-08-24 NOTE — Assessment & Plan Note (Signed)
[facility]                            CARDIOLOGY OFFICE NOTE   ZIARE, ORRICK                         MRN:          [id number]  DATE:05/01/2008                            DOB:          1970-10-18    Ms. [name] is a 40 year old female with a history of asthma, obstructive  sleep apnea, and polycystic ovarian disease.  I saw her on June 29, 2006 last.  At that time, she was complaining of atypical chest pain and  dyspnea.  A Myoview was performed on July 20, 2006, that showed an  ejection fraction of 66%.  There was normal perfusion.  An  echocardiogram was performed on July 25, 2006, that showed an ejection  fraction of 50%.  There was mild mitral regurgitation and the left  atrium was mildly dilated.  There was mild tricuspid regurgitation.  I  have not seen her since then.  In November, she did contact the office  and was complaining of intermittent palpitations.  We scheduled her to  have a CardioNet monitor.  This was performed on March 24, 2008.  She  was found to have a sinus-to-sinus tachycardia with occasional PVCs and  brief runs of paroxysmal atrial tachycardia.  She now returns for  followup.  She states that these palpitations are now currently on a  daily basis.  She feels her heart race briefly for at longest 30 seconds  to 1 minute.  They are sudden in onset and not associated with activity.  There is no associated chest pain, shortness of breath, or syncope.  They resolve spontaneously.  Yesterday, she also states that she had a  pain in her chest for a brief amount of time.  It was described as a  pressure, and then a tingling sensation in her left upper extremity.  However, she is not having increased dyspnea on exertion, orthopnea,  PND, pedal edema, or exertional chest pain.   MEDICATIONS:  Omeprazole 20 mg p.o. daily and she is also on albuterol  as needed, famciclovir, and alprazolam.   PHYSICAL EXAMINATION:  VITAL  SIGNS:  Today, shows a blood pressure of  118/86 and her pulse is 80.  She weighs 282 pounds.  GENERAL:  She is well developed and morbidly obese.  HEENT:  Normal.  NECK:  Supple.  CHEST:  Clear.  CARDIOVASCULAR:  Regular rate and rhythm.  ABDOMEN:  No tenderness.  EXTREMITIES:  No edema.   Her electrocardiogram shows a sinus rhythm at a rate of 80.  The axis is  normal.  There are no ST changes noted.  There is no delta wave noted.  Her PR interval was normal.   DIAGNOSES:  1. Palpitations - this appears to be related to premature ventricular      contractions and brief paroxysmal atrial tachycardia.  We will      begin Toprol 25 mg p.o. daily to see if this improves her symptoms.      Also, repeat her echocardiogram and reassess her left ventricular      function.  We will check a  TSH, magnesium, and BMET.  Now, we will      also check a CBC as she has complained of some degree of fatigue.      I will see her back in 4-6 weeks to see if her symptoms have      improved.  Note, she had previous Myoview in 2008 that showed      normal perfusion.  2. Paroxysmal atrial tachycardia - as per above.  We will begin      Toprol.  3. Polycystic ovarian disease - management per her primary care      physician.  4. History of asthma - I have instructed her to watch for any signs of      worsening asthma on the Toprol.  5. Obstructive sleep apnea.     Madolyn Frieze Jens Som, MD, [facility]  Electronically Signed    BSC/MedQ  DD: 05/01/2008  DT: 05/02/2008  Job #: [id number]

## 2010-08-24 NOTE — Assessment & Plan Note (Signed)
[facility]                            CARDIOLOGY OFFICE NOTE   Lorraine, [name]                         MRN:          [id number]  DATE:05/29/2008                            DOB:          07/12/1970    [name] is a pleasant 40 year old female that I recently saw on  May 01, 2008, secondary to palpitations.  It was noted that she had  a previous Myoview performed in April 2008 that showed an ejection  fraction of 66% with normal perfusion.  When I saw her previously, we  noted that she was having palpitations.  A CardioNet monitor had showed  sinus-to-sinus tachycardia with occasional PVCs and brief runs of  paroxysmal atrial tachycardia.  We did schedule her to have lab work.  Her TSH was normal at 2.91.  Her potassium was 4.  Her magnesium was  1.8.  She was also complaining of fatigue and we checked a hemoglobin,  which was 12.5.  We also schedule her to have an echocardiogram on  May 07, 2008.  Her LV function was in low-normal range and estimated  50-55%.  Per Dr. Fabio Bering interpretation, there was inferobasal  hypokinesis.  The aortic valve was mildly calcified.  Since I last saw  her, she has not taken the Toprol that we prescribed previously as she  states she is afraid of medicines.  She has continued to have  occasional palpitations.  They have not changed in frequency.  She has  also had some brief chest pain.  However, it only last 1-2 seconds and  resolves spontaneously.  It is described as a sharp pain.  She has mild  dyspnea on exertion, but has started new exercise program that  attributes to her size.  Note, her chest pain does not radiate.  It is  not pleuritic or positional or nor is related to food.  There is no  associated symptoms.   MEDICATIONS:  Omeprazole 20 mg p.o. nightly and she uses see CPAP.   PHYSICAL EXAMINATION:  VITAL SIGNS:  Blood pressure of 138/88 and her  pulse is 83.  She weighs 281 pounds.  HEENT:   Normal.  NECK:  Supple.  CHEST:  Clear.  CARDIOVASCULAR:  Regular rhythm.  ABDOMEN:  No tenderness.  EXTREMITIES:  No edema.   DIAGNOSES:  1. Palpitations secondary to premature ventricular contractions and      brief runs of paroxysmal atrial tachycardia - I again explained to      the patient that in the setting of a normal left ventricular      function, these rhythm disturbances are benign.  She did get her      Toprol filled, but did not take her medications that she was      afraid of it.  She instead wants to try to reduce her caffeine      intake and also to exercise and lose weight.  I have explained that      if those symptoms do not improve that she could certainly try the      Toprol  at that point.  She will consider this.  2. Polycystic ovarian disease.  3. History of asthma - management per her primary care.  4. Obstructive sleep apnea - she will continue with her continuous      positive airway pressure.   I will see her back in 6 months.     [name] Jens Som, MD, [facility]  Electronically Signed    BSC/MedQ  DD: 05/29/2008  DT: 05/29/2008  Job #: [id number]

## 2010-08-27 NOTE — Assessment & Plan Note (Signed)
[facility]                            CARDIOLOGY OFFICE NOTE   LANDY, MACE                         MRN:          [id number]  DATE:06/29/2006                            DOB:          12/25/1970    [name] is a 40 year old female with a past medical history of asthma,  obstructive sleep apnea, and polycystic ovarian disease, whom we were  asked to evaluate for chest pain and dyspnea.  She has no prior cardiac  history.  She apparently was treated for bronchitis and pneumonia back  in the fall.  She now is having problems with dyspnea on exertion.  She  also has orthopnea and pedal edema.  She also has chest pain at times.  This can be in the left side of the chest or the right side of the  chest.  It can occur with exertion or with rest, but predominantly with  walking.  It lasts less than a minute and resolves spontaneously.  The  pain does not radiate.  There is some nausea, but there is no shortness  of breath or diaphoresis.  The pain is not pleuritic, positional, nor is  it related to food.  She also had a sharp pain in her chest recently  that radiated to her left upper extremity by her report, with numbness  in her left upper extremity.  Because of the above, we were asked to  further evaluate.   MEDICATIONS AT PRESENT:  Symbicort, Prevacid, and Advil.   She has no known drug allergies.   SOCIAL HISTORY:  She does not smoke, and rarely consumes alcohol.  She  denies any drug use.   FAMILY HISTORY:  Negative for coronary artery disease.  She did say that  her sister had a murmur.   PAST MEDICAL HISTORY:  1. There is no  diabetes mellitus, hypertension, or hyperlipidemia.  2. She does have a history of asthma and bronchitis, as well as      obstructive sleep apnea.  3. She has a history of delayed gastric emptying and reflux.  4. She also has polycystic ovarian disease.  5. She has had previous 2 C-sections and also a tubal  ligation.   REVIEW OF SYSTEMS:  She denies any chills, but she has had some fever  recently by her report.  She also occasionally has headache.  There is  no productive cough or hemoptysis.  She denies any dysphagia,  odynophagia, melena, or hematochezia.  There is no dysuria or hematuria.  She denies any seizure activity.  There is orthopnea and pedal edema.  there is no claudication noted.  There is no claudication noted.  The  remaining systems are negative.   PHYSICAL EXAMINATION:  VITAL SIGNS:  Blood pressure 135/86, pulse 85.  She weighs 293 pounds.  GENERAL:  She is well developed and morbidly obese.  She is in no acute  distress at present.  She does not appear to be depressed.  SKIN:  Warm and dry.  There is no peripheral clubbing.  BACK:  Normal.  HEENT:  Unremarkable,  with normal eyelids.  NECK:  Supple, with a normal upstroke bilaterally.  I could not  appreciate bruits.  There is no jugular venous distention, and no  thyromegaly is noted.  CHEST:  Clear to auscultation, with normal expansion.  CARDIOVASCULAR:  Regular rate and rhythm, normal  S1 and S2.  There are  no murmurs, rubs, or gallops.  I could not palpate a PMI.  ABDOMEN:  Significant for obesity.  There is no tenderness to palpation,  nor is there hepatosplenomegaly.  I could not palpate masses or bruits.  She has 2+ femoral pulses bilaterally, no bruits.  EXTREMITIES:  Showed trace edema bilaterally.  There are no cords  palpated, although the exam is difficult due to her obesity.  She has 2+  posterior tibial pulses bilaterally.  NEUROLOGIC:  Grossly intact.   Her electrocardiogram today shows a sinus rhythm at a rate of 85.  The  axis is normal.  There are no ST changes noted.   DIAGNOSES:  1. Exertional chest pain and dyspnea.  2. Morbid obesity.  3. History of asthma.  4. Obstructive sleep apnea.  5. Polycystic ovarian disease.   PLAN:  [name] presents for evaluation of chest pain and dyspnea.   Her  symptoms are somewhat atypical, but she is very concerned about these.  We will plan to proceed with an adenosine Myoview for risk  stratification.  I will also schedule her to have an echocardiogram,  both to quantify her left and right ventricular function.  If these are  unremarkable, then I do not think we need to proceed with further  cardiac workup.  Some of her pain may be musculoskeletal or related to  her asthma.  I will see her back on an as-needed basis pending the  results of her studies.     Madolyn Frieze Jens Som, MD, [facility]  Electronically Signed    BSC/MedQ  DD: 06/29/2006  DT: 06/29/2006  Job #: [id number]   cc:   Coralyn Helling, MD

## 2010-08-27 NOTE — Assessment & Plan Note (Signed)
[facility]                               PULMONARY OFFICE NOTE   [name], [name]                         MRN:          [id number]  DATE:02/24/2006                            DOB:          [birth year]    I met Ms. Tagliaferri today for evaluation of her dyspnea and wheezing.  She says  that she had no difficulties with her breathing prior to this past  September, at which time she had developed a bronchitis and was treated with  azithromycin.  She had a similar occurrence of symptoms in October and then  in the beginning of November she was actually treated for a pneumonia, which  was seen on a chest x-ray on November 1, involving the right middle lobe,  but the followup x-ray from November 10 showed essentially resolution of the  infiltrate.  At that time, she was treated with a course of Avelox for 14  days, as well as getting a steroid injection and then a tapering dose of  prednisone.  She has since also been started on Qvar, Xopenex and Mucinex.  She says she gets a sensation of something stuck in her throat frequently  and feels the need to constantly clear her throat.  She was having a fever  up to 101 but this has improved.  She was also having a cough productive of  yellow to greenish sputum as well as having significant sinus tenderness and  postnasal drip.  She says she has been getting coughing both day and night  as well as wheezing.  However, she denies any hemoptysis or epistaxis.  She  has not had any recent travel history and she has not had any other sick  exposures.  She did not have any history of asthma previously and she has  not undergone pulmonary function test previously.  She does have a pet dog  but does not have any other significant animal exposures.  She says that if  she is around strong smells, this will cause her to have some irritation but  does not worsen her current symptoms.   PAST MEDICAL HISTORY:  Is significant for  polycystic ovarian disease and  gastroesophageal reflux disease.   PAST SURGICAL HISTORY:  Significant for lower back surgery.  She has had  tubal ligation, and she has had 2 cesarean sections.   She has no known drug allergies.   CURRENT MEDICATIONS:  1. Qvar 80 mcg 1 puff b.i.d.  2. Prevacid 30 mg daily.  3. Mucinex b.i.d.  4. Prednisone.  5. Xopenex as needed.   SOCIAL HISTORY:  She is married.  She quit smoking 6 months ago.  She smoked  a quarter-pack of cigarettes a day for approximately 10 years.  She has  occasional alcohol use.  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   FAMILY HISTORY:  Is significant for a grandfather who had emphysema and her  grandmother who had heart disease as well as hypertension.   REVIEW OF SYSTEMS:  She complains of headaches as  well as occasional  tightness in her chest associated with her coughing and wheezing.  Additionally, she had undergone an overnight polysomnogram on July 12, 2005,  which showed an apnea-hypopnea index of 34.3 and an oxygen saturation nadir  of  90%.   PHYSICAL EXAMINATION:  She is 5 feet, 5 inches tall, 285 pounds.  Temperature is 98.3.  Blood pressure is 120/88.  Heart rate is 78.  Oxygen  saturation is 96% on room air.  HEENT:  Pupils are reactive.  Extraocular muscles are intact.  She has mild  maxillary sinus tenderness with clear nasal discharge.  She has mild  erythema of the posterior pharynx.  There is no lymphadenopathy, no  thyromegaly.  HEART:  S1, S2.  Regular rhythm.  CHEST:  She had decreased breath sounds but no wheezing or rales.  ABDOMEN:  Obese, soft and tender.  EXTREMITIES:  There was no edema, cyanosis or clubbing.  NEUROLOGIC:  No focal deficits were appreciated.   IMPRESSION:  1. Asthmatic bronchitis with a recent episode of pneumonia.  This in fact      may be related to reactive airway disease.  She does also have symptoms      significant for sinus disease with postnasal  drip which could be      contributing significantly to her respiratory symptoms as well.  For      this I will switch her to Symbicort 160/4.5 two puffs b.i.d. as well as      give her a tapering course of prednisone.  I also will give her a more      prolonged course of antibiotics with Augmentin, although to treat her      sinus disease I have also given her a sample for Nasonex 2 sprays in      each nostril as well as use of nasal irrigation and then I will      continue her on her Xopenex inhaler as needed.  I will make      arrangements for her to undergo pulmonary function tests when she is      clinically more stable.  2. Obesity with severe obstructive sleep apnea as seen on her overnight      polysomnogram from July 12, 2005.  I reviewed the results of her sleep      study with her, and I discussed the adverse consequences of untreated      sleep apnea.  I also discussed with her the importance of diet and      exercise with weight reduction as well as the use alcohol and      sedatives.  Driving precautions were discussed with her as well. She      will need to have arrangements made to undergo a CPAP titration study      for further treatment of her obstructive sleep apnea.  3. I have arranged for her to have followup with me in approximately 2      weeks.     Coralyn Helling, MD  Electronically Signed    VS/MedQ  DD: 02/28/2006  DT: 02/28/2006  Job #: [id number]   cc:   Carrington Clamp, M.D.

## 2010-08-27 NOTE — Op Note (Signed)
NAMEPAIZLEE, [name]                  ACCOUNT NO.:  [id number]   MEDICAL RECORD NO.:  [id number]          PATIENT TYPE:  AMB   LOCATION:  ENDO                         FACILITY:  [facility]   PHYSICIAN:  Graylin Shiver, M.D.   DATE OF BIRTH:  May 03, 1970   DATE OF PROCEDURE:  DATE OF DISCHARGE:                                 OPERATIVE REPORT   PROCEDURE:  Esophagogastroduodenoscopy with biopsy for CLO-test.   INDICATION:  Upper abdominal pain etiology unclear. The patient does have an  abnormal gallbladder ejection fraction.  Upper endoscopy is being done to  determine if there is any abnormality in the upper GI tract which might  explain her abdominal pain and associated nausea and vomiting.   Informed consent was obtained after explanation of the risks of bleeding,  infection and perforation.   PREMEDICATION:  Fentanyl 80 mcg IV, Versed 10 mg IV.   PROCEDURE:  With the patient in the left lateral decubitus position the  Olympus gastroscope was inserted into the oropharynx and passed into the  esophagus it was advanced down the esophagus, then into the stomach, and  into the duodenum. The second portion of the duodenum looked normal. The  bulb of the duodenum was erythematous compatible with duodenitis.  The  stomach showed a diffuse erythematous appearance to the mucosa compatible  with gastritis. Biopsy for CLO-test was obtained. No lesions were seen in  the fundus or cardia on retroflexion.  The esophagus looked normal. The  esophagogastric junction was at 38 cm. She tolerated the procedure well  without complications.   IMPRESSION:  1.  Gastritis and duodenitis.  2.  Biopsy for CLO-test was obtained.   PLAN:  1.  Check the CLO-test to look for evidence of Helicobacter pylori.  2.  Continue Prevacid 30 mg daily, to see if this helps.  3.  The patient was given a prescription for Bentyl 20 mg p.o. 1/2 hour      before meals t.i.d. to see if this helps.  4.  I will follow her up in  the office in a month to see how she is doing      clinically       SFG/MEDQ  D:  04/28/2004  T:  04/28/2004  Job:  [id number]   cc:   Lorne Skeens. Hoxworth, M.D.  [address], [address]  [place]  [place] [address]  Fax: [phone]   Delaney Meigs, M.D.  [address]  [place]  [place] [address]  Fax: [phone]

## 2010-08-27 NOTE — Procedures (Signed)
NAMERENATE, DANH                  ACCOUNT NO.:  [id number]   MEDICAL RECORD NO.:  [id number]          PATIENT TYPE:  OUT   LOCATION:  SLEEP CENTER                 FACILITY:  [facility]   PHYSICIAN:  Coralyn Helling, MD        DATE OF BIRTH:  [birth year]   DATE OF STUDY:  04/21/2006                            NOCTURNAL POLYSOMNOGRAM   INDICATION FOR STUDY:  This is an individual who had undergone a  polysomnogram on July 12, 2005, which showed an apnea/hypopnea index of  34 and oxygen saturation nadir of 90%.  She had a significant positional  affect and was noted to have a decrease in the amount of REM sleep.  She  was referred to the sleep lab for a CPAP titration study.   EPWORTH SLEEPINESS SCORE:  10.   MEDICATIONS:  Symbicort, Xopenex, Viramist, and Prevacid.   SLEEP ARCHITECTURE:  Total recording time was 399.5 minutes.  Total  sleep time was 251 minutes.  Sleep efficiency was 63%, which is reduced.  The study was notable for the lack of slow wave sleep and REM sleep.  Sleep latency was 31 minutes, which is prolonged.  The patient slept in  both the supine and non-supine positions.   RESPIRATORY DATA:  The average respiratory rate was 18.  The patient was  titrated from a CPAP pressure setting of 5 to 8 cmH2O.  At 8 cmH2O, the  apnea/hypopnea index was reduced to 1.8.  At this pressure setting, the  patient was observed in supine sleep and snoring was limited.   OXYGEN DATA:  The baseline oxygenation was 99%, the oxygen saturation  nadir was 93%.  At a CPAP pressure setting of 8 cmH2O, the mean  oxygenation during non-REM sleep was 97% and the minimal was 93%.   CARDIAC DATA:  Rhythm strip showed normal sinus rhythm.   MOVEMENT-PARASOMNIA:  The periodic limb movement index was 0.2.   IMPRESSIONS-RECOMMENDATIONS:  At a CPAP pressure setting of 8 cmH2O, the  apnea/hypopnea index reduced to 1.8.  At this pressure setting, snoring  was eliminated, oxygenation stabilized, and sleep  architecture  stabilized.  She was observed in supine sleep at this pressure setting  but not in REM sleep.      Coralyn Helling, MD  Diplomat, American Board of Sleep Medicine  Electronically Signed    VS/MEDQ  D:  05/16/2006 16:31:25  T:  05/16/2006 18:40:53  Job:  [id number]

## 2010-08-27 NOTE — H&P (Signed)
[name], [name]                              ACCOUNT NO.:  [id number]   MEDICAL RECORD NO.:  [id number]                   PATIENT TYPE:  INP   LOCATION:  A308                                 FACILITY:  [facility]   PHYSICIAN:  Hanley Hays. Dechurch, M.D.           DATE OF BIRTH:  [birth year]   DATE OF ADMISSION:  08/31/2002  DATE OF DISCHARGE:                                HISTORY & PHYSICAL   HISTORY OF PRESENT ILLNESS:  A 40 year old Caucasian female with little  chronic medical problems who presents to the emergency room with reportedly  acute onset left chest and right upper quadrant pain and epigastric pain  with nausea and vomiting that started this afternoon.  It has persisted.  She notes some chills at the time of onset but no diarrhea.  She has no  headache at this time.  History however, reveals chronic nausea and vomiting  for greater than six years.  Over the past month she has had daily nausea  and vomiting usually in the morning and this is confirmed by her spouse.  She notes that the emesis is productive of what she describes as white  seeds and occasionally wakes up in the middle of the night with nausea but  usually does not vomit.  She has not found anything that improves the  symptomatology.  She states she has never discussed this with her physicians  in the past.  She recently moved to this area and does not have a primary  care physician.  She has had no change in her stool habits which are usually  once every day or every other day.   REVIEW OF SYSTEMS:  Pertinent for irregular periods since August 2003 she  has only had two periods since that time.  Urine pregnancy test is negative  here in the emergency room.  The patient also admits to generalized weakness  and malaise, decreased libido, weight gain of 12 pounds over the last month  although this is probably to the fact she stopped exercising.  No reflux  dysphagia symptoms.  She denies any history of bulimia or  anorexia.  Pertinent for chronic fatigue, anhedonia.  No agoraphobia.  Headaches which  she describes as burning and occipital for which she takes Aleve.  She  actually goes to a tanning bed.  No hot or cold intolerance.  No edema.  No  skin rashes or other lesions.  Neurologic no personality changes or  confusion.  She did have an episode where she noted the left side of her  body was numb and tingling for a period of time and she could not talk which  resolved spontaneously and she did not seek medical attention.   MEDICATIONS:  No prescribed medicines.  She had previously been on  Wellbutrin but stopped this almost a year ago.  She takes an occasional  Aleve, no more than one per  day.   ALLERGIES:  None known.   PAST MEDICAL HISTORY:  1. C-section x 2.  2. Tubal ligation.  3. Toxemia with the second pregnancy.   PAST SURGICAL HISTORY:  No surgeries.   FAMILY MEDICAL HISTORY:  Sister is apparently being worked up for a what  sounds like a muscle enzyme problem.  Mother has hypertension and heart  problems though no diagnosis.  Father apparently had an MI in his 21s and  hypertension.   SOCIAL HISTORY:  She is married for the second time.  Her first husband  died.  She has two children.  No alcohol or tobacco use or abuse.  She  denies any other illicit drug use.   PHYSICAL EXAMINATION:  GENERAL:  Obese, white female, no distress at this  time.  VITAL SIGNS:  Temperature 98.2, blood pressure 138/71, pulse 76 and regular.  NECK:  Supple.  There is no adenopathy.  She has a prominent thyroid though.  No bruits are present.  LUNGS:  Clear.  HEART:  Regular.  No murmur, gallop or rub.  HEENT:  Dentition is okay.  The enamel is intact.  ABDOMEN:  Soft.  Few bowel sounds.  Nontender.  EXTREMITIES:  No clubbing, cyanosis or edema.  Distal pulses are intact.  SKIN:  Without rash or lesion.   LABORATORY DATA:  Essentially normal except her white count is 11 but  without shift.    ASSESSMENT/PLAN:  1. Intractable nausea and vomiting with right upper quadrant chest pain.  A     CT of the abdomen shows no abnormality for what that is worth.  Given her     symptomatology certainly a GI evaluation is reasonable to rule out peptic     disease.  There is nothing here to suggest cardiac and therefore, I am     not going to further evaluate this at this time.  We will give her     empiric Protonix and monitor.  2. She may have a dysmotility syndrome or something other work up is     certainly reasonable.  She does not have any joint complaints therefore I     am not going to pursue this at this time.  3. Amenorrhea.  Check TSH.  Doubt polycystic ovarian disease given the     history, doubt premature ovarian failure but this is in the differential,     consider GYN referral.  She has not had an evaluation recently.  4. History of panic disorder may be playing a role though.  It is certainly     reasonable to rule out some of the above prior to proceeding with     treatment.  Counseling certainly would be reasonable in this patient as     was discussed with her.  She states all of her medical problems are     attributed to her anxiety and depression which she does not feel that is     the case.  The plan was discussed with the patient and her spouse.  They     seem to have a reasonable understanding.                                               Hanley Hays Josefine Class, M.D.    FED/MEDQ  D:  09/01/2002  T:  09/01/2002  Job:  [id number]

## 2010-08-27 NOTE — Assessment & Plan Note (Signed)
[facility]                             PULMONARY OFFICE NOTE   RUSSIE, GULLEDGE                         MRN:          [id number]  DATE:06/26/2006                            DOB:          13-Feb-1971    I saw Ms. Martin today for followup of her asthmatic bronchitis, severe  obstructive sleep apnea and obesity.   Since her last visit, she had undergone a CPAP titration study and at a  CPAP pressure setting of 8 cm of water, her apnea/hypopnea index was  reduced to 1.8.  She is currently using CPAP with nasal pillows and a  heated humidifier.  She has no difficulty falling asleep with a mask,  but will wake up around 2:00 or 3:00 in the morning and take the mask  off.  She oftentimes has realized that she is doing this.  She will then  wake up at about 6:00 in the morning.  She says that she does notice  that she is sleeping better at night and feeling somewhat better during  the day, but still having some difficulties with daytime fatigue.  She  has gained approximately 8 pounds since her last visit.  She says she is  using a CPAP machine on a nightly basis.  With regards to her asthma,  she had run out of her Symbicort and has been using her albuterol on a  daily basis.  She also says that she had been having intermittent  episodes of chest pain and tightness, which she says usually happens  when she is walking.  She describes it as an uncomfortable feeling  associated with feeling she is short of breath, and she says that she  apparently had an EKG recently which showed a regular heart rate.  She  had tried using her albuterol inhaler, when she had these episodes, but  did not really notice much difference with this.  She says the main  thing is that if she stops to rest, her symptoms improve.   PHYSICAL EXAMINATION:  VITAL SIGNS:  She is 293 pounds, temperature is  97.8, blood pressure is 118/92, heart rate is 66, oxygen saturation is  99% on  room air.  HEENT:  Pupils reactive.  There is no sinus tenderness, no nasal  discharge, no oral lesions.  HEART:  S1, S2, regular rhythm.  CHEST:  Clear to auscultation.  ABDOMEN:  Obese, soft, nontender.  EXTREMITIES:  No edema.   IMPRESSION:  1. Severe obstructive sleep apnea.  I will continue her on CPAP at 8      cm of water, and I have encouraged her to try and use her machine      for the entire night that she is asleep; however, if she continues      to have an increase in her weight, she may need to have adjustments      in her pressure setting, but I would not make any adjustments at      this time, as she is still in the process of addressing to her CPAP  machine.  2. Asthma.  I will re-initiate her Symbicort 160/4.5, 2 puffs b.i.d.,      as she does seem to require the use of an albuterol inhaler on a      frequent basis.  I have also given her a prescription for Pro-Air      HFA, which she is to use two puffs q.i.d. as needed.  3. Exertional chest pain.  She did not give a classic description of      cardiac-related angina; nevertheless, I do feel that it would be      warranted for her to have this further evaluated.  I will make      arrangements for her to be seen by a cardiologist.  In the      meantime, I have instructed her that, if she notices that these      symptoms were to become more persistent, that she should see,      immediate medical attention.   I plan on following up with her in approximately 6-8 weeks.     Coralyn Helling, MD  Electronically Signed    VS/MedQ  DD: 06/26/2006  DT: 06/26/2006  Job #: [id number]   cc:   Carrington Clamp, M.D.

## 2010-08-27 NOTE — Discharge Summary (Signed)
NAMEADALYN, [name]                              ACCOUNT NO.:  [id number]   MEDICAL RECORD NO.:  [id number]                   PATIENT TYPE:  INP   LOCATION:  A308                                 FACILITY:  [facility]   PHYSICIAN:  Sarita Bottom, M.D.                  DATE OF BIRTH:  [birth year]   DATE OF ADMISSION:  08/31/2002  DATE OF DISCHARGE:  09/01/2002                                 DISCHARGE SUMMARY   DISCHARGE DIAGNOSES:  1. Nausea and vomiting, resolving.  2. Depression and anxiety.  3. Abnormal menstrual periods.   DISCHARGE MEDICATIONS:  1. Phenergan 12.5 mg p.o. q.6h. p.r.n.  2. Prozac 10 mg daily.  3. Tylenol 650 mg q.6h. p.r.n.   FOLLOWUP:  1. The patient is to follow up with primary care physician.  2. The patient is advised to seek gynecology evaluation for irregular     menstrual periods.   HISTORY OF PRESENT ILLNESS:  [name] is a 40 year old lady with no  significant medical history who presented to the emergency room yesterday  with a complaint of abdominal pain, nausea and vomiting, and also some chest  pain.  She also complains of irregular menstrual periods.   PHYSICAL EXAMINATION:  VITAL SIGNS:  Blood pressure of 138/71, temperature  was 98.1.  NECK:  Her thyroid gland appeared slightly prominent.  Physical examination otherwise was essentially unremarkable.   LABORATORY DATA:  Essentially normal.  CT scan of the abdomen was negative.   HOSPITAL COURSE:  The patient was admitted with the impression of nausea and  vomiting.  She was given IV hydration, Protonix, and Phenergan.  Her  clinical course was significant for improvement of her nausea and vomiting.  The patient still complained of abdominal pain and some chest discomfort.  She has been seen on ward rounds today.  Her blood pressure is 100/56, heart  rate of 68, temperature is 97.  She admits to being depressed.  No more  nausea or vomiting this morning.  Generally, she looks anxious, but in  no  acute distress.  Her chest is clear to auscultation with some slight chest  wall tenderness on the anterior chest wall.  Abdomen still has some slight  tenderness on deep palpation.  Her latest labs shows a magnesium of 2.1.  Pregnancy test was negative.  Lipase and amylase are normal.  Sodium was  135, potassium 3.5, chloride 105, CO2 26, BUN of 13, creatinine of 1,  glucose is 90.  White blood cell count was 11.9 with normal differential.  Hemoglobin of 12.1.   The patient will be discharged home today on the above medications.   CONDITION ON DISCHARGE:  Stable and satisfactory.  Sarita Bottom, M.D.    DW/MEDQ  D:  09/01/2002  T:  09/01/2002  Job:  [id number]

## 2010-08-27 NOTE — Assessment & Plan Note (Signed)
[facility]                             PULMONARY OFFICE NOTE   [name], [name]                         MRN:          [id number]  DATE:03/14/2006                            DOB:          [birth year]    I saw [name] today in followup for her asthmatic bronchitis with  recent episode of pneumonia, severe obstructive sleep apnea, and  sinusitis.   She says that her symptoms have improved to some degree.  She is still  having some cough with occasional clear to greenish sputum.  Her sinus  symptoms have improved.  She has not tried using nasal irrigation as of  yet but she is using the Nasonex nasal spray and she has also completed  her course of Augmentin.  She has 4 more days left of her prednisone  taper and she is continuing to use her Symbicort 160/4.5 two puffs  b.i.d.  Additionally, she is also continuing to use her Prevacid 30 mg  daily in the morning as well as using her Xopenex on an as-needed basis.   PHYSICAL EXAM:  She is 285 pounds.  Temperature 97.7.  Blood pressure  128/80.  Heart rate is 88.  Oxygen saturation is 96% on room air.  HEENT:  There is no sinus tenderness.  No nasal discharge.  No oral  lesions.  HEART:  S1 and S2.  CHEST:  There was no wheezing or rales.  ABDOMEN:  Obese, soft, nontender.  EXTREMITIES:  There was no edema.   IMPRESSION:  1. Asthmatic bronchitis with recent pneumonia.  She has improved to      some degree since her initial visit.  I will have her continue to      use her Nasonex and I have also asked her to try starting to use      the nasal irrigation.  I will also continue her on Symbicort and      Xopenex and then I will make arrangements for her to undergo a      pulmonary function test to further evaluate her lung mechanics.  2. Severe obstructive sleep apnea.  I will make arrangements for her      to undergo a continuous positive airway pressure titration study      and then once the results  of this are available, I would initiate      her on appropriate therapy for her sleep apnea.  In the meantime, I      have discussed with her again the importance of diet, exercise, and      weight reduction.  3. I will have followup with her in approximately 4 weeks.     Coralyn Helling, MD  Electronically Signed    VS/MedQ  DD: 03/14/2006  DT: 03/15/2006  Job #: [id number]   cc:   Carrington Clamp, M.D.

## 2010-08-27 NOTE — Procedures (Signed)
[name], [name]                  ACCOUNT NO.:  [id number]   MEDICAL RECORD NO.:  [id number]          PATIENT TYPE:  OUT   LOCATION:  SLEEP CENTER                 FACILITY:  [facility]   PHYSICIAN:  Clinton D. Maple Hudson, M.D. DATE OF BIRTH:  September 15, 1970   DATE OF STUDY:  07/12/2005                              NOCTURNAL POLYSOMNOGRAM   REFERRING PHYSICIAN:  Dr. Malva Limes.   INDICATIONS FOR STUDY:  Hypersomnia with sleep apnea.  Epworth sleepiness  score 13/24, BMI 29.  Weight 175 pounds.   HOME MEDICATIONS:  Prozac, albuterol, Zegerid, Reglan, Kristalose.   SLEEP ARCHITECTURE:  Total sleep time 304 minutes with sleep efficiency 85%.  Stage I was 10%, stage II 70%, stages III and IV 12%, REM 8% of total sleep  time.  Sleep latency 19 minutes, REM latency 171 minutes, awake after sleep  onset 16 minutes, arousal index increased at 33.2.  No bedtime medication  was reported.  The patient indicated she sleeps better at home.   RESPIRATORY DATA:  MPSG protocol requested.  Apnea/hypopnea index (AHI, RDI)  34.3 obstructive events per hour indicating moderately severe obstructive  sleep apnea/hypopnea syndrome.  There were four obstructive apneas and 170  hypopneas.  Events were more common while supine but present in all sleep  positions.  REM AHI absent.   OXYGEN DATA:  Mild to moderate intermittent snoring with oxygen desaturation  to a nadir of 90% on room air.  Mean oxygen saturation through the study was  95% on room air.   CARDIAC DATA:  Normal sinus rhythm.   MOVEMENT/PARASOMNIA:  Occasional leg jerk with insignificant effect on  sleep.  Technician reported that the patient would moan and groan just  before or as turning over.   IMPRESSION/RECOMMENDATIONS:  1.  Moderately severe obstructive sleep apnea/hypopnea syndrome, AHI 34.3      per hour with events in all sleep positions, more common while supine.      Most events were hypopneas.  Mild to moderate snoring with  desaturation to 90%.  2.  Consider return for CPAP titration or evaluate for alternative therapies      as appropriate.      Clinton D. Maple Hudson, M.D.  Diplomate, Biomedical engineer of Sleep Medicine  Electronically Signed     CDY/MEDQ  D:  07/17/2005 11:55:31  T:  07/18/2005 07:07:26  Job:  [id number]

## 2010-09-08 ENCOUNTER — Encounter: Payer: Self-pay | Admitting: Internal Medicine

## 2010-09-22 ENCOUNTER — Ambulatory Visit: Payer: 59 | Admitting: Internal Medicine

## 2010-10-31 ENCOUNTER — Other Ambulatory Visit: Payer: Self-pay | Admitting: Internal Medicine

## 2010-12-01 ENCOUNTER — Encounter: Payer: Self-pay | Admitting: Internal Medicine

## 2010-12-01 ENCOUNTER — Ambulatory Visit (INDEPENDENT_AMBULATORY_CARE_PROVIDER_SITE_OTHER): Payer: 59 | Admitting: Internal Medicine

## 2010-12-01 VITALS — BP 112/82 | HR 98 | Temp 98.5°F | Ht 65.0 in

## 2010-12-01 DIAGNOSIS — E669 Obesity, unspecified: Secondary | ICD-10-CM

## 2010-12-01 DIAGNOSIS — H6640 Suppurative otitis media, unspecified, unspecified ear: Secondary | ICD-10-CM

## 2010-12-01 MED ORDER — HYDROCODONE-ACETAMINOPHEN 5-500 MG PO TABS: 1.0000 | ORAL_TABLET | ORAL | Status: DC | PRN

## 2010-12-01 MED ORDER — AMOXICILLIN-POT CLAVULANATE 875-125 MG PO TABS: 1.0000 | ORAL_TABLET | Freq: Two times a day (BID) | ORAL | Status: AC

## 2010-12-01 MED ORDER — FLUCONAZOLE 150 MG PO TABS: 150.0000 mg | ORAL_TABLET | Freq: Once | ORAL | Status: AC

## 2010-12-01 NOTE — Progress Notes (Signed)
  Subjective:    Patient ID: [name], female    DOB: 1971/02/23, 40 y.o.   MRN: [id number]  HPI complains of L ear pain associated with sore throat and ache on left side of neck below ear Onset 4 days ago -  Stabbing, throbbing pain -- no drainage No jaw pain or teeth pain No head trauma or  headache Hx recurrent L ear problems - last one spring 2012, occurs every 6-9 months  Past Medical History  Diagnosis Date  . POLYCYSTIC OVARIAN DISEASE   . OBESITY   . SMOKER   . OBSTRUCTIVE SLEEP APNEA   . DELAYED GASTRIC EMPTYING   . DEPRESSION   . GERD   . Anxiety   . ASTHMA      Review of Systems  Constitutional: Positive for fever.  HENT: Positive for ear pain and sore throat. Negative for mouth sores, trouble swallowing, dental problem and sinus pressure.   Eyes: Negative for pain.  Respiratory: Negative for shortness of breath.   Cardiovascular: Negative for chest pain.       Objective:   Physical Exam BP 112/82  Pulse 98  Temp(Src) 98.5 F (36.9 C) (Oral)  Ht 5\' 5"  (1.651 m)  SpO2 97% Wt Readings from Last 3 Encounters:  08/11/10 296 lb (134.265 kg)  01/27/10 292 lb 6.4 oz (132.632 kg)  01/20/10 294 lb (133.358 kg)   Constitutional: She is obese; appears well-developed and well-nourished. Mildly uncomfortable but no distress.  HENT: Head: Normocephalic and atraumatic. Ears: L TM hazy and erythema, +effusion; R TM ok, no erythema or effusion; no otitis externa Mouth/Throat: Oropharynx is clear and moist. No oropharyngeal exudate.  Eyes: Conjunctivae and EOM are normal. Pupils are equal, round, and reactive to light. No scleral icterus.  Neck: thick -normal range of motion. Neck supple. No JVD or LAD present. No thyromegaly present.  Cardiovascular: Normal rate, regular rhythm and normal heart sounds.  No murmur heard. No BLE edema. Pulmonary/Chest: Effort normal and breath sounds normal. No respiratory distress. She has no wheezes.  Abdominal: Soft. Bowel sounds  are normal. She exhibits no distension. There is no tenderness. no masses Neurological: She is alert and oriented to person, place, and time. No cranial nerve deficit. Coordination normal.   Lab Results  Component Value Date   WBC 9.6 08/06/2010   HGB 13.1 08/06/2010   HCT 38.2 08/06/2010   PLT 293.0 08/06/2010   CHOL 207* 08/06/2010   TRIG 147.0 08/06/2010   HDL 39.00* 08/06/2010   LDLDIRECT 149.7 08/06/2010   ALT 13 08/06/2010   AST 9 08/06/2010   NA 138 08/06/2010   K 4.1 08/06/2010   CL 108 08/06/2010   CREATININE 0.7 08/06/2010   BUN 12 08/06/2010   CO2 23 08/06/2010   TSH 3.72 08/06/2010      Assessment & Plan:  L OM, recurrent - augmentin x 10d, few vicodin for pain as needed and refer to ENT for eval/tx

## 2010-12-01 NOTE — Patient Instructions (Signed)
It was good to see you today. Augmentin antibiotics and diflucan for yeast, vicodin for pain and refer to ENT specialist Your prescription(s) have been submitted to your pharmacy. Please take as directed and contact our office if you believe you are having problem(s) with the medication(s). Start metformin as we discussed! Please schedule followup in 3 months, call sooner if problems.

## 2010-12-01 NOTE — Assessment & Plan Note (Signed)
Wt Readings from Last 3 Encounters:  08/11/10 296 lb (134.265 kg)  01/27/10 292 lb 6.4 oz (132.632 kg)  01/20/10 294 lb (133.358 kg)   Obstacles related to health reviewed in depth including options for weight loss - pt will consider and work on diet/exercise efforts follow up 6-8 weeks to continue accountability and counseling Encouraged to start metformin as previously prescribed

## 2011-01-03 LAB — PREGNANCY, URINE: Preg Test, Ur: NEGATIVE

## 2011-01-03 LAB — CBC
HCT: 38.7
Hemoglobin: 13.4
MCHC: 34.6
MCV: 90.3
Platelets: 311
RBC: 4.28
RDW: 13
WBC: 10.8 — ABNORMAL HIGH

## 2011-01-03 LAB — URINALYSIS, ROUTINE W REFLEX MICROSCOPIC
Glucose, UA: NEGATIVE
Hgb urine dipstick: NEGATIVE
Ketones, ur: NEGATIVE
Nitrite: NEGATIVE
Protein, ur: NEGATIVE
Specific Gravity, Urine: >1.03 — ABNORMAL HIGH
Urobilinogen, UA: 0.2
pH: 5.5

## 2011-01-05 ENCOUNTER — Ambulatory Visit (INDEPENDENT_AMBULATORY_CARE_PROVIDER_SITE_OTHER): Payer: 59 | Admitting: Internal Medicine

## 2011-01-05 ENCOUNTER — Encounter: Payer: Self-pay | Admitting: Internal Medicine

## 2011-01-05 ENCOUNTER — Ambulatory Visit (INDEPENDENT_AMBULATORY_CARE_PROVIDER_SITE_OTHER): Payer: 59

## 2011-01-05 DIAGNOSIS — E669 Obesity, unspecified: Secondary | ICD-10-CM

## 2011-01-05 DIAGNOSIS — E282 Polycystic ovarian syndrome: Secondary | ICD-10-CM

## 2011-01-05 LAB — TSH: TSH: 2.15 u[IU]/mL (ref 0.35–5.50)

## 2011-01-05 LAB — VITAMIN B12: Vitamin B-12: 282 pg/mL (ref 211–911)

## 2011-01-05 LAB — HEMOGLOBIN A1C: Hgb A1c MFr Bld: 5.6 % (ref 4.6–6.5)

## 2011-01-05 NOTE — Patient Instructions (Signed)
It was good to see you today. Test(s) ordered today. Your results will be called to you after review (48-72hours after test completion). If any changes need to be made, you will be notified at that time. Continue metformin as we discussed! we'll make referral to endocrine Dr Everardo All. Our office will contact you regarding appointment(s) once made. Please schedule followup in 2 months, call sooner if problems.

## 2011-01-05 NOTE — Assessment & Plan Note (Addendum)
Follows with gyn for same - long hx ammenorrhea but resumed cycle since starting metformin 11/2010-  Continue same Also refer to endo for opinion and review with endo at pt request

## 2011-01-05 NOTE — Assessment & Plan Note (Signed)
Wt Readings from Last 3 Encounters:  01/05/11 298 lb 6.4 oz (135.353 kg)  08/11/10 296 lb (134.265 kg)  01/27/10 292 lb 6.4 oz (132.632 kg)   Obstacles related to health reviewed in depth including options for weight loss - pt will consider and work on diet/exercise efforts Check labs today for DM risk, thyroid and B12 at pt request Continue follow up 8 weeks to continue accountability and counseling

## 2011-01-05 NOTE — Progress Notes (Signed)
  Subjective:    Patient ID: [name], female    DOB: 1970-10-06, 40 y.o.   MRN: [id number]  HPI  Here for follow up - reviewed chronic medical issues:  depression/anxiety-  denies hx same - med started for stomach problems by GI- on paxil for >75mo in 2011 w/o change - wean off med 12/2009 Felt more irritable off med so resumed same early 2012 continued excess fatigue and sleeping 10-12h/d exac with mom dx breast cancer 11/2009 - "but everything ok now" symptoms improved with xanax as needed, rare use  PCOS - started metformin 11/2010 - following with gyn for same - 2nd period of last 12 months started 1 week ago  obesity - weight gain noted - ?bariatric clinic for "vitamins and shots" - has [employer] but doesn't utilize  GERD and gastroparesis - dx 2006 on GES and has seen GI for same - continued abd pain, worse with meals - nausea and vomiting if eat too much or too fast. No bowel changes, +weight gain, no loss - not taking rx or otc meds for reflux burning  OSA - reports compliance with CPAP but continued fatigue - see above  Past Medical History  Diagnosis Date  . POLYCYSTIC OVARIAN DISEASE   . OBESITY   . SMOKER   . OBSTRUCTIVE SLEEP APNEA   . DELAYED GASTRIC EMPTYING   . DEPRESSION   . GERD   . Anxiety   . ASTHMA     Review of Systems Constitutional: Negative for fever.  Respiratory: Negative for cough and shortness of breath.   Cardiovascular: Negative for chest pain.      Objective:   Physical Exam  BP 122/90  Pulse 86  Temp(Src) 98.4 F (36.9 C) (Oral)  Ht 5\' 4"  (1.626 m)  Wt 298 lb 6.4 oz (135.353 kg)  BMI 51.22 kg/m2  SpO2 97% Wt Readings from Last 3 Encounters:  01/05/11 298 lb 6.4 oz (135.353 kg)  08/11/10 296 lb (134.265 kg)  01/27/10 292 lb 6.4 oz (132.632 kg)   Constitutional: She is obese; appears well-developed and well-nourished. No distress.  Neck: Normal range of motion. Neck supple. No JVD present. No thyromegaly present.    Cardiovascular: Normal rate, regular rhythm and normal heart sounds.  No murmur heard. no BLE edema Pulmonary/Chest: Effort normal and breath sounds normal. No respiratory distress. She has no wheezes.  Neurological: She is alert and oriented to person, place, and time. No cranial nerve deficit. Coordination normal.  Psychiatric: She has a normal mood and affect. Her behavior is normal. Judgment and thought content normal.   Lab Results  Component Value Date   WBC 9.6 08/06/2010   HGB 13.1 08/06/2010   HCT 38.2 08/06/2010   PLT 293.0 08/06/2010   CHOL 207* 08/06/2010   TRIG 147.0 08/06/2010   HDL 39.00* 08/06/2010   LDLDIRECT 149.7 08/06/2010   ALT 13 08/06/2010   AST 9 08/06/2010   NA 138 08/06/2010   K 4.1 08/06/2010   CL 108 08/06/2010   CREATININE 0.7 08/06/2010   BUN 12 08/06/2010   CO2 23 08/06/2010   TSH 3.72 08/06/2010       Assessment & Plan:   see problem list. Medications and labs reviewed today.

## 2011-01-10 ENCOUNTER — Telehealth: Payer: Self-pay | Admitting: *Deleted

## 2011-01-10 MED ORDER — CYANOCOBALAMIN 1000 MCG PO TABS: 1000.0000 ug | ORAL_TABLET | Freq: Every day | ORAL | Status: AC

## 2011-01-10 NOTE — Telephone Encounter (Signed)
Called pt no answer LMOM mg of b12...01/10/11@2 :32pm/LMB

## 2011-01-10 NOTE — Telephone Encounter (Signed)
Patient requesting a call back to clarify the amount of B-12 MD suggested she take daily. She has had trouble finding B-12 100 mcg and wants to know if that was the correct dose? (OK TO LEAVE DETAILED VM ON HM #)

## 2011-01-10 NOTE — Telephone Encounter (Signed)
OTC B12 qd - added to med list - thanks

## 2011-01-14 ENCOUNTER — Ambulatory Visit: Payer: 59 | Admitting: Endocrinology

## 2011-01-18 ENCOUNTER — Ambulatory Visit: Payer: 59 | Admitting: Endocrinology

## 2011-01-20 ENCOUNTER — Ambulatory Visit: Payer: 59 | Admitting: Endocrinology

## 2011-03-07 ENCOUNTER — Ambulatory Visit: Payer: 59 | Admitting: Internal Medicine

## 2011-05-02 ENCOUNTER — Encounter: Payer: Self-pay | Admitting: Endocrinology

## 2011-05-02 ENCOUNTER — Ambulatory Visit (INDEPENDENT_AMBULATORY_CARE_PROVIDER_SITE_OTHER): Payer: 59 | Admitting: Endocrinology

## 2011-05-02 VITALS — BP 122/82 | HR 90 | Temp 97.2°F | Ht 65.0 in | Wt 294.4 lb

## 2011-05-02 DIAGNOSIS — J069 Acute upper respiratory infection, unspecified: Secondary | ICD-10-CM

## 2011-05-02 MED ORDER — AZITHROMYCIN 500 MG PO TABS: 500.0000 mg | ORAL_TABLET | Freq: Every day | ORAL | Status: AC

## 2011-05-02 MED ORDER — PROMETHAZINE-CODEINE 6.25-10 MG/5ML PO SYRP: 5.0000 mL | ORAL_SOLUTION | ORAL | Status: AC | PRN

## 2011-05-02 NOTE — Patient Instructions (Addendum)
i have sent a prescription to your pharmacy, for an antibiotic Here is a prescription for cough syrup Loratadine-d (non-prescription) will help your congestion. I hope you feel better soon.  If you don't feel better by next week, please call back.

## 2011-05-02 NOTE — Progress Notes (Signed)
Subjective:    Patient ID: [name], female    DOB: 1970/06/12, 41 y.o.   MRN: [id number]  HPI Pt states few days of slight pain at both ears, and assoc prod-quality cough in the chest Past Medical History  Diagnosis Date  . POLYCYSTIC OVARIAN DISEASE   . OBESITY   . SMOKER   . OBSTRUCTIVE SLEEP APNEA   . DELAYED GASTRIC EMPTYING   . DEPRESSION   . GERD   . Anxiety   . ASTHMA     Past Surgical History  Procedure Date  . Cocxyl removal 1995  . Dilation and curettage of uterus   . Tubal ligation 1998  . Cesarean section 97 & 98    x's 2    History   Social History  . Marital Status: Married    Spouse Name: N/A    Number of Children: N/A  . Years of Education: N/A   Occupational History  . Not on file.   Social History Main Topics  . Smoking status: Current Everyday Smoker  . Smokeless tobacco: Not on file   Comment: Lives with husband & 2 kids from 1st marriage. (1st husband expired 1998-leukemia) work as Engineer, structural, but currently unemployed  . Alcohol Use: Yes     Rarely  . Drug Use: No  . Sexually Active:    Other Topics Concern  . Not on file   Social History Narrative  . No narrative on file    Current Outpatient Prescriptions on File Prior to Visit  Medication Sig Dispense Refill  . acetaminophen (TYLENOL) 500 MG tablet Take 500 mg by mouth as needed.        [name] albuterol (PROAIR HFA) 108 (90 BASE) MCG/ACT inhaler Inhale 2 puffs into the lungs every 4 (four) hours as needed.        . ALPRAZolam (XANAX) 0.5 MG tablet Take 0.5 mg by mouth every 8 (eight) hours as needed.        . calcium carbonate (TUMS - DOSED IN MG ELEMENTAL CALCIUM) 500 MG chewable tablet Chew 2 tablets by mouth daily.        . cyanocobalamin ([facility] VITAMIN B12) 1000 MCG tablet Take 1 tablet (1,000 mcg total) by mouth daily.  100 tablet  2  . ibuprofen (ADVIL,MOTRIN) 200 MG tablet Take 200 mg by mouth as needed.        . metFORMIN (GLUCOPHAGE XR) 500 MG 24 hr tablet Take  1 tablet (500 mg total) by mouth daily with breakfast.  30 tablet  11  . PARoxetine (PAXIL) 10 MG tablet TAKE 1 TABLET EVERY DAY  30 tablet  6  . promethazine (PHENERGAN) 25 MG tablet 1 BY MOUTH EVERY 4 HOURS AS NEEDED FOR NAUSEA  30 tablet  0    No Known Allergies  Family History  Problem Relation Age of Onset  . Breast cancer Mother     dx 11/2009  . Diabetes Maternal Aunt   . Prostate cancer Maternal Grandfather 50    also hx colon polyps  . Heart disease Paternal Grandmother   . Pancreatic cancer Paternal Grandmother 64    BP 122/82  Pulse 90  Temp(Src) 97.2 F (36.2 C) (Oral)  Ht 5\' 5"  (1.651 m)  Wt 294 lb 6.4 oz (133.539 kg)  BMI 48.99 kg/m2  SpO2 97%    Review of Systems Uncertain if she has had any fever.  She has nasal congestion.      Objective:  Physical Exam VITAL SIGNS:  See vs page GENERAL: no distress head: no deformity eyes: no periorbital swelling, no proptosis external nose and ears are normal mouth: no lesion seen NECK: There is no palpable thyroid enlargement.  No thyroid nodule is palpable.  No palpable lymphadenopathy at the anterior neck. Left ear is very red.  Right is slightly red. LUNGS:  Clear to auscultation        Assessment & Plan:  URI, new

## 2011-05-09 ENCOUNTER — Ambulatory Visit (INDEPENDENT_AMBULATORY_CARE_PROVIDER_SITE_OTHER): Payer: 59 | Admitting: Internal Medicine

## 2011-05-09 ENCOUNTER — Encounter: Payer: Self-pay | Admitting: Internal Medicine

## 2011-05-09 DIAGNOSIS — E282 Polycystic ovarian syndrome: Secondary | ICD-10-CM

## 2011-05-09 DIAGNOSIS — E669 Obesity, unspecified: Secondary | ICD-10-CM

## 2011-05-09 DIAGNOSIS — F329 Major depressive disorder, single episode, unspecified: Secondary | ICD-10-CM

## 2011-05-09 MED ORDER — PHENTERMINE HCL 37.5 MG PO CAPS: 37.5000 mg | ORAL_CAPSULE | ORAL | Status: DC

## 2011-05-09 NOTE — Assessment & Plan Note (Signed)
On low dose Paxil since 2011 - intermittent compliance until 2012 Also uses prn xanax - refill ok

## 2011-05-09 NOTE — Assessment & Plan Note (Signed)
Wt Readings from Last 3 Encounters:  05/09/11 300 lb 3.2 oz (136.17 kg)  05/02/11 294 lb 6.4 oz (133.539 kg)  01/05/11 298 lb 6.4 oz (135.353 kg)   Obstacles related to health reviewed in depth including options for weight loss - pt will consider and work on diet/exercise efforts Trial 30d phentermine at pt request - reviewed possible benefit and risks on same med tx

## 2011-05-09 NOTE — Assessment & Plan Note (Signed)
Follows with gyn for same - long hx ammenorrhea but resumed cycle briefly after starting metformin 11/2010-  Continue same

## 2011-05-09 NOTE — Progress Notes (Signed)
  Subjective:    Patient ID: [name], female    DOB: 07-23-1970, 41 y.o.   MRN: [id number]  HPI  Here for follow up - reviewed chronic medical issues:  depression/anxiety-  Initially begun on SSRI by GI for IBS symptoms  started paxil early 2011 - self-wean off med 12/2009 Felt more irritable off med so resumed same early 2012 exac with mom dx breast cancer 11/2009 - "but everything ok now" symptoms improved with xanax as needed, rare use but requests refill  PCOS - started metformin 11/2010 - following with gyn for same - resumed menses x 2, but none since 02/2011  obesity - weight gain noted - would like to attend bariatric clinic for "vitamins and shots" - has [employer] membership but doesn't utilize  GERD and gastroparesis - dx 2006 on GES and has seen GI for same - continued abd pain, worse with meals - nausea and vomiting if eat too much or too fast. No bowel changes, +weight gain, no loss - not taking rx or otc meds for reflux burning  OSA - reports compliance with CPAP but continued fatigue -  Past Medical History  Diagnosis Date  . POLYCYSTIC OVARIAN DISEASE   . OBESITY   . SMOKER   . OBSTRUCTIVE SLEEP APNEA   . DELAYED GASTRIC EMPTYING   . DEPRESSION   . GERD   . Anxiety   . ASTHMA     Review of Systems Constitutional: Negative for fever or unexpected weight change. Positive for fatigue.  Respiratory: Negative for cough and shortness of breath.   Cardiovascular: Negative for chest pain.      Objective:   Physical Exam  BP 112/74  Pulse 88  Temp(Src) 97.7 F (36.5 C) (Oral)  Wt 300 lb 3.2 oz (136.17 kg)  SpO2 97% Wt Readings from Last 3 Encounters:  05/09/11 300 lb 3.2 oz (136.17 kg)  05/02/11 294 lb 6.4 oz (133.539 kg)  01/05/11 298 lb 6.4 oz (135.353 kg)   Constitutional: She is obese; appears well-developed and well-nourished. No distress.  Neck: Normal range of motion. Neck supple. No JVD present. No thyromegaly present.  Cardiovascular: Normal rate,  regular rhythm and normal heart sounds.  No murmur heard. no BLE edema Pulmonary/Chest: Effort normal and breath sounds normal. No respiratory distress. She has no wheezes.  Neurological: She is alert and oriented to person, place, and time. No cranial nerve deficit. Coordination normal.  Psychiatric: She has a normal mood and affect. Her behavior is normal. Judgment and thought content normal.   Lab Results  Component Value Date   WBC 9.6 08/06/2010   HGB 13.1 08/06/2010   HCT 38.2 08/06/2010   PLT 293.0 08/06/2010   CHOL 207* 08/06/2010   TRIG 147.0 08/06/2010   HDL 39.00* 08/06/2010   LDLDIRECT 149.7 08/06/2010   ALT 13 08/06/2010   AST 9 08/06/2010   NA 138 08/06/2010   K 4.1 08/06/2010   CL 108 08/06/2010   CREATININE 0.7 08/06/2010   BUN 12 08/06/2010   CO2 23 08/06/2010   TSH 2.15 01/05/2011   HGBA1C 5.6 01/05/2011       Assessment & Plan:   see problem list. Medications and labs reviewed today.

## 2011-05-09 NOTE — Patient Instructions (Signed)
It was good to see you today. Phentermine for appetite suppression as requested today - no more than 3 consecutive prescriptions will be given in 12 months time - Your prescription(s) have been given to you to submit to your pharmacy. Please take as directed and contact our office if you believe you are having problem(s) with the medication(s). Continue metformin as we discussed! No other medication changes Please schedule followup in 4-6 weeks for weight check and medication review, call sooner if problems.

## 2011-06-13 ENCOUNTER — Telehealth: Payer: Self-pay | Admitting: *Deleted

## 2011-06-13 ENCOUNTER — Encounter: Payer: Self-pay | Admitting: Internal Medicine

## 2011-06-13 ENCOUNTER — Ambulatory Visit (INDEPENDENT_AMBULATORY_CARE_PROVIDER_SITE_OTHER): Payer: 59 | Admitting: Internal Medicine

## 2011-06-13 DIAGNOSIS — E669 Obesity, unspecified: Secondary | ICD-10-CM

## 2011-06-13 DIAGNOSIS — M2669 Other specified disorders of temporomandibular joint: Secondary | ICD-10-CM

## 2011-06-13 DIAGNOSIS — R29898 Other symptoms and signs involving the musculoskeletal system: Secondary | ICD-10-CM

## 2011-06-13 DIAGNOSIS — H9209 Otalgia, unspecified ear: Secondary | ICD-10-CM

## 2011-06-13 DIAGNOSIS — Z0000 Encounter for general adult medical examination without abnormal findings: Secondary | ICD-10-CM

## 2011-06-13 DIAGNOSIS — H9202 Otalgia, left ear: Secondary | ICD-10-CM

## 2011-06-13 MED ORDER — PHENTERMINE HCL 37.5 MG PO CAPS: 37.5000 mg | ORAL_CAPSULE | ORAL | Status: DC

## 2011-06-13 NOTE — Assessment & Plan Note (Signed)
Wt Readings from Last 3 Encounters:  06/13/11 300 lb 12.8 oz (136.442 kg)  05/09/11 300 lb 3.2 oz (136.17 kg)  05/02/11 294 lb 6.4 oz (133.539 kg)   Obstacles related to health reviewed in depth including options for weight loss - pt will consider and work on diet/exercise efforts Trial 30d phentermine at pt request - reviewed possible benefit and risks on same med tx

## 2011-06-13 NOTE — Patient Instructions (Signed)
It was good to see you today. Phentermine for appetite suppression as requested today - no more than 3 consecutive prescriptions will be given in 12 months time - 1st prescription given today; Your prescription(s) have been given to you to submit to your pharmacy. Please take as directed and contact our office if you believe you are having problem(s) with the medication(s). Continue metformin as we discussed! No other medication changes I think your left ear pain is coming from left TMJ  symptoms - please schedule an appointment with your dentist for evaluation of same to consider mouth night guard or other dental treatment Please schedule followup in 4-6 weeks for weight check and medication review, call sooner if problems.  Temporomandibular Joint Pain Your exam shows that you have a problem with your temporomandibular joint (TMJ), the joint that moves when you open your mouth or chew food. TMJ problems can result from direct injuries, bite abnormalities, or tension states which cause you to grind or clench your teeth. Typical symptoms include pain around the joint, clicking, restricted movement, and headaches. The TMJ is like any other joint in the body; when it is strained, it needs rest to repair itself. To keep the joint at rest it is important that you do not open your mouth wider than the width of your index finger. If you must yawn, be sure to support your chin with your hand so your mouth does not open wide. Eat a soft diet (nothing firmer than ground beef, no raw vegetables), do not chew gum and do not talk if it causes you pain. Apply topical heat by using a warm, moist cloth placed in front of the ear for 15 to 20 minutes several times daily. Alternating heat and ice may give even more relief. Anti-inflammatory pain medicine and muscle relaxants can also be helpful. A dental orthotic or splint may be used for temporary relief. Long-term problems may require treatment for stress as well as  braces or surgery. Please check with your doctor or dentist if your symptoms do not improve within one week. Document Released: 05/05/2004 Document Revised: 03/17/2011 Document Reviewed: 03/28/2005 [facility] Patient Information 2012 [place], [place].

## 2011-06-13 NOTE — Progress Notes (Signed)
Subjective:    Patient ID: [name], female    DOB: 07/27/1970, 41 y.o.   MRN: [id number]  HPI  Here for follow up -  Did not start phentermine, ?didn;t get rx Also continued L ear pain symptoms  (ongoing > 6 mo)  Also reviewed chronic medical issues:  depression/anxiety-  Initially begun on SSRI by GI for IBS symptoms  started paxil early 2011 - self-wean off med 12/2009 Felt more irritable off med so resumed same early 2012 exac with mom dx breast cancer 11/2009 - "but everything ok now" symptoms improved with xanax as needed, rare use but requests refill  PCOS - started metformin 11/2010 - following with gyn for same - resumed menses x 2, but none since 02/2011  obesity - weight gain noted - would like to attend bariatric clinic for "vitamins and shots" - has [employer] membership but doesn't utilize  GERD and gastroparesis - dx 2006 on GES and has seen GI for same - continued abd pain, worse with meals - nausea and vomiting if eat too much or too fast. No bowel changes, +weight gain, no loss - not taking rx or otc meds for reflux burning  OSA - reports compliance with CPAP but continued fatigue -  Past Medical History  Diagnosis Date  . POLYCYSTIC OVARIAN DISEASE   . OBESITY   . SMOKER   . OBSTRUCTIVE SLEEP APNEA   . DELAYED GASTRIC EMPTYING   . DEPRESSION   . GERD   . Anxiety   . ASTHMA     Review of Systems Constitutional: Negative for fever or unexpected weight change. Positive for fatigue.  ENT: chronic L ear pain, no drainage or hearing change Respiratory: Negative for cough and shortness of breath.   Cardiovascular: Negative for chest pain.      Objective:   Physical Exam  BP 118/80  Pulse 81  Temp(Src) 98.4 F (36.9 C) (Oral)  Ht 5\' 5"  (1.651 m)  Wt 300 lb 12.8 oz (136.442 kg)  BMI 50.06 kg/m2  SpO2 98% Wt Readings from Last 3 Encounters:  06/13/11 300 lb 12.8 oz (136.442 kg)  05/09/11 300 lb 3.2 oz (136.17 kg)  05/02/11 294 lb 6.4 oz (133.539 kg)     Constitutional: She is obese; appears well-developed and well-nourished. No distress.  HENT: R+L TM clear; +TMJ click on left side Neck: Normal range of motion. Neck supple. No JVD present. No thyromegaly present.  Cardiovascular: Normal rate, regular rhythm and normal heart sounds.  No murmur heard. no BLE edema Pulmonary/Chest: Effort normal and breath sounds normal. No respiratory distress. She has no wheezes.  Psychiatric: She has a normal mood and affect. Her behavior is normal. Judgment and thought content normal.   Lab Results  Component Value Date   WBC 9.6 08/06/2010   HGB 13.1 08/06/2010   HCT 38.2 08/06/2010   PLT 293.0 08/06/2010   CHOL 207* 08/06/2010   TRIG 147.0 08/06/2010   HDL 39.00* 08/06/2010   LDLDIRECT 149.7 08/06/2010   ALT 13 08/06/2010   AST 9 08/06/2010   NA 138 08/06/2010   K 4.1 08/06/2010   CL 108 08/06/2010   CREATININE 0.7 08/06/2010   BUN 12 08/06/2010   CO2 23 08/06/2010   TSH 2.15 01/05/2011   HGBA1C 5.6 01/05/2011       Assessment & Plan:   see problem list. Medications and labs reviewed today.  L ear pain - suspect referred from TMJ - advised dentist eval - ?mouth  night gaurd

## 2011-06-13 NOTE — Telephone Encounter (Signed)
Message copied by Deatra James on Mon Jun 13, 2011  4:55 PM ------      Message from: COUSIN, SHARON T      Created: Mon Jun 13, 2011  4:45 PM      Regarding: [facility] DATE  08/17/11       THANKS

## 2011-06-13 NOTE — Telephone Encounter (Signed)
Received staff msg need cpx labs entered... 06/13/11@4 :56pm/LMB

## 2011-06-20 ENCOUNTER — Other Ambulatory Visit: Payer: Self-pay | Admitting: Internal Medicine

## 2011-07-25 ENCOUNTER — Ambulatory Visit (INDEPENDENT_AMBULATORY_CARE_PROVIDER_SITE_OTHER): Payer: 59 | Admitting: Internal Medicine

## 2011-07-25 ENCOUNTER — Encounter: Payer: Self-pay | Admitting: Internal Medicine

## 2011-07-25 VITALS — BP 120/78 | HR 86 | Temp 97.6°F | Ht 65.0 in | Wt 293.8 lb

## 2011-07-25 DIAGNOSIS — E669 Obesity, unspecified: Secondary | ICD-10-CM

## 2011-07-25 DIAGNOSIS — F3289 Other specified depressive episodes: Secondary | ICD-10-CM

## 2011-07-25 DIAGNOSIS — F329 Major depressive disorder, single episode, unspecified: Secondary | ICD-10-CM

## 2011-07-25 MED ORDER — PHENTERMINE HCL 37.5 MG PO CAPS: 37.5000 mg | ORAL_CAPSULE | ORAL | Status: DC

## 2011-07-25 NOTE — Assessment & Plan Note (Signed)
On low dose Paxil since 2011 - intermittent compliance until 2012 Also uses prn xanax -  The current medical regimen is effective;  continue present plan and medications.

## 2011-07-25 NOTE — Patient Instructions (Signed)
It was good to see you today. Congratulations on your weight loss! Keep up the good work ! Continue another month Phentermine for appetite suppression as requested today - no more than 3 consecutive prescriptions will be given in 12 months time - 2nd prescription given today; Your prescription(s) have been given to you to submit to your pharmacy. Please take as directed and contact our office if you believe you are having problem(s) with the medication(s). Please keep scheduled followup for physical/labs and weight check + medication review, call sooner if problems.

## 2011-07-25 NOTE — Assessment & Plan Note (Signed)
Wt Readings from Last 3 Encounters:  07/25/11 293 lb 12.8 oz (133.267 kg)  06/13/11 300 lb 12.8 oz (136.442 kg)  05/09/11 300 lb 3.2 oz (136.17 kg)   Obstacles related to health reviewed in depth including options for weight loss -  pt will continue to work on diet/exercise efforts Repeat 2nd month 30d phentermine now - started same 3/13

## 2011-07-25 NOTE — Progress Notes (Signed)
  Subjective:    Patient ID: [name], female    DOB: 02/24/1971, 41 y.o.   MRN: [id number]  HPI  Here for follow up - reviewed chronic medical issues:  depression/anxiety-  Initially begun on SSRI by GI for IBS symptoms  started paxil early 2011 - self-wean off med 12/2009 Felt more irritable off med so resumed same early 2012 exac with mom dx breast cancer 11/2009 - "but everything ok now" symptoms improved with xanax as needed, rare use but requests refill  PCOS - started metformin 11/2010 - following with gyn for same - resumed menses x 2 cycles -  obesity - chronic problem - has considered bariatric clinic for "vitamins and shots" - has [employer] - started work - phentermine started 06/2011  GERD and gastroparesis - dx 2006 on GES and has seen GI for same - continued abd pain, worse with meals - nausea and vomiting if eat too much or too fast. No bowel changes,  not taking rx or otc meds for reflux burning  OSA - reports compliance with CPAP but continued chronic fatigue -  Past Medical History  Diagnosis Date  . POLYCYSTIC OVARIAN DISEASE   . OBESITY   . SMOKER   . OBSTRUCTIVE SLEEP APNEA   . DELAYED GASTRIC EMPTYING   . DEPRESSION   . GERD   . Anxiety   . ASTHMA     Review of Systems Constitutional: Negative for fever or unexpected weight change. Positive for fatigue.  Cardiovascular: Negative for chest pain or palpitations.      Objective:   Physical Exam  BP 120/78  Pulse 86  Temp(Src) 97.6 F (36.4 C) (Oral)  Ht 5\' 5"  (1.651 m)  Wt 293 lb 12.8 oz (133.267 kg)  BMI 48.89 kg/m2  SpO2 97% Wt Readings from Last 3 Encounters:  07/25/11 293 lb 12.8 oz (133.267 kg)  06/13/11 300 lb 12.8 oz (136.442 kg)  05/09/11 300 lb 3.2 oz (136.17 kg)   Constitutional: She is obese; appears well-developed and well-nourished. No distress.  Neck: Normal range of motion. Neck supple. No JVD present. No thyromegaly present.  Cardiovascular: Normal rate, regular rhythm  and normal heart sounds.  No murmur heard. no BLE edema Pulmonary/Chest: Effort normal and breath sounds normal. No respiratory distress. She has no wheezes.  Psychiatric: She has a normal mood and affect. Her behavior is normal. Judgment and thought content normal.   Lab Results  Component Value Date   WBC 9.6 08/06/2010   HGB 13.1 08/06/2010   HCT 38.2 08/06/2010   PLT 293.0 08/06/2010   CHOL 207* 08/06/2010   TRIG 147.0 08/06/2010   HDL 39.00* 08/06/2010   LDLDIRECT 149.7 08/06/2010   ALT 13 08/06/2010   AST 9 08/06/2010   NA 138 08/06/2010   K 4.1 08/06/2010   CL 108 08/06/2010   CREATININE 0.7 08/06/2010   BUN 12 08/06/2010   CO2 23 08/06/2010   TSH 2.15 01/05/2011   HGBA1C 5.6 01/05/2011       Assessment & Plan:   see problem list. Medications and labs reviewed today.

## 2011-08-11 ENCOUNTER — Other Ambulatory Visit (INDEPENDENT_AMBULATORY_CARE_PROVIDER_SITE_OTHER): Payer: 59

## 2011-08-11 DIAGNOSIS — Z0000 Encounter for general adult medical examination without abnormal findings: Secondary | ICD-10-CM

## 2011-08-11 LAB — URINALYSIS, ROUTINE W REFLEX MICROSCOPIC
Hgb urine dipstick: NEGATIVE
Ketones, ur: NEGATIVE
Leukocytes, UA: NEGATIVE
Nitrite: NEGATIVE
Specific Gravity, Urine: >=1.03 (ref 1.000–1.030)
Urine Glucose: NEGATIVE
Urobilinogen, UA: 0.2 (ref 0.0–1.0)
pH: 6 (ref 5.0–8.0)

## 2011-08-11 LAB — CBC WITH DIFFERENTIAL/PLATELET
Basophils Absolute: 0 10*3/uL (ref 0.0–0.1)
Basophils Relative: 0.4 % (ref 0.0–3.0)
Eosinophils Absolute: 0.1 10*3/uL (ref 0.0–0.7)
Eosinophils Relative: 1.7 % (ref 0.0–5.0)
HCT: 39.4 % (ref 36.0–46.0)
Hemoglobin: 13.3 g/dL (ref 12.0–15.0)
Lymphocytes Relative: 28.9 % (ref 12.0–46.0)
Lymphs Abs: 2.2 10*3/uL (ref 0.7–4.0)
MCHC: 33.6 g/dL (ref 30.0–36.0)
MCV: 91.6 fl (ref 78.0–100.0)
Monocytes Absolute: 0.4 10*3/uL (ref 0.1–1.0)
Monocytes Relative: 5.2 % (ref 3.0–12.0)
Neutro Abs: 4.9 10*3/uL (ref 1.4–7.7)
Neutrophils Relative %: 63.8 % (ref 43.0–77.0)
Platelets: 317 10*3/uL (ref 150.0–400.0)
RBC: 4.3 Mil/uL (ref 3.87–5.11)
RDW: 13.1 % (ref 11.5–14.6)
WBC: 7.7 10*3/uL (ref 4.5–10.5)

## 2011-08-11 LAB — BASIC METABOLIC PANEL
BUN: 14 mg/dL (ref 6–23)
CO2: 25 mEq/L (ref 19–32)
Calcium: 9 mg/dL (ref 8.4–10.5)
Chloride: 107 mEq/L (ref 96–112)
Creatinine, Ser: 0.8 mg/dL (ref 0.4–1.2)
GFR: 79.98 mL/min (ref >60.00)
Glucose, Bld: 84 mg/dL (ref 70–99)
Potassium: 3.9 mEq/L (ref 3.5–5.1)
Sodium: 140 mEq/L (ref 135–145)

## 2011-08-11 LAB — HEPATIC FUNCTION PANEL
ALT: 13 U/L (ref 0–35)
AST: 9 U/L (ref 0–37)
Albumin: 3.8 g/dL (ref 3.5–5.2)
Alkaline Phosphatase: 67 U/L (ref 39–117)
Bilirubin, Direct: 0 mg/dL (ref 0.0–0.3)
Total Bilirubin: 0.3 mg/dL (ref 0.3–1.2)
Total Protein: 7.3 g/dL (ref 6.0–8.3)

## 2011-08-11 LAB — LIPID PANEL
Cholesterol: 189 mg/dL (ref 0–200)
HDL: 40.1 mg/dL (ref >39.00)
LDL Cholesterol: 132 mg/dL — ABNORMAL HIGH (ref 0–99)
Total CHOL/HDL Ratio: 5
Triglycerides: 84 mg/dL (ref 0.0–149.0)
VLDL: 16.8 mg/dL (ref 0.0–40.0)

## 2011-08-11 LAB — TSH: TSH: 2.11 u[IU]/mL (ref 0.35–5.50)

## 2011-08-17 ENCOUNTER — Ambulatory Visit (INDEPENDENT_AMBULATORY_CARE_PROVIDER_SITE_OTHER): Payer: 59 | Admitting: Internal Medicine

## 2011-08-17 ENCOUNTER — Encounter: Payer: Self-pay | Admitting: Internal Medicine

## 2011-08-17 VITALS — BP 124/76 | HR 111 | Temp 97.8°F | Ht 65.0 in | Wt 289.0 lb

## 2011-08-17 DIAGNOSIS — Z0000 Encounter for general adult medical examination without abnormal findings: Secondary | ICD-10-CM

## 2011-08-17 NOTE — Patient Instructions (Signed)
It was good to see you today. Health Maintenance reviewed - all recommended immunizations and age-appropriate screenings are up-to-date. It was good to see you today. Congratulations on your weight loss! Keep up the good work ! Please schedule followup in 3 months for weight check, call sooner if problems.

## 2011-08-17 NOTE — Progress Notes (Signed)
Subjective:    Patient ID: [name], female    DOB: Dec 20, 1970, 41 y.o.   MRN: [id number]  HPI patient is here today for annual physical. Patient feels well and has no complaints.  Past Medical History  Diagnosis Date  . POLYCYSTIC OVARIAN DISEASE   . OBESITY   . SMOKER   . OBSTRUCTIVE SLEEP APNEA   . DELAYED GASTRIC EMPTYING   . DEPRESSION   . GERD   . Anxiety   . ASTHMA    Family History  Problem Relation Age of Onset  . Breast cancer Mother     dx 11/2009  . Diabetes Maternal Aunt   . Prostate cancer Maternal Grandfather 57    also hx colon polyps  . Heart disease Paternal Grandmother   . Pancreatic cancer Paternal Grandmother 77   History  Substance Use Topics  . Smoking status: Current Some Day Smoker  . Smokeless tobacco: Not on file   Comment: Lives with husband & 2 kids from 1st marriage. (1st husband expired 1998-leukemia) work as Engineer, structural, but currently unemployed  . Alcohol Use: Yes     Rarely    Review of Systems Constitutional: Negative for fever or weight change.  Respiratory: Negative for cough and shortness of breath.   Cardiovascular: Negative for chest pain or palpitations.  Gastrointestinal: Negative for abdominal pain, no bowel changes.  Musculoskeletal: Negative for gait problem or joint swelling.  Skin: Negative for rash.  Neurological: Negative for dizziness or headache.  No other specific complaints in a complete review of systems (except as listed in HPI above).     Objective:   Physical Exam BP 124/76  Pulse 111  Temp(Src) 97.8 F (36.6 C) (Oral)  Ht 5\' 5"  (1.651 m)  Wt 289 lb (131.09 kg)  BMI 48.09 kg/m2  SpO2 99%  LMP 07/29/2011 Wt Readings from Last 3 Encounters:  08/17/11 289 lb (131.09 kg)  07/25/11 293 lb 12.8 oz (133.267 kg)  06/13/11 300 lb 12.8 oz (136.442 kg)   Constitutional: She is overweight but appears well-developed and well-nourished. No distress.  HENT: Head: Normocephalic and atraumatic.  Ears: B TMs ok, no erythema or effusion; Nose: Nose normal. Mouth/Throat: Oropharynx is clear and moist. No oropharyngeal exudate.  Eyes: Conjunctivae and EOM are normal. Pupils are equal, round, and reactive to light. No scleral icterus.  Neck: Thick, normal range of motion. Neck supple. No JVD present. No thyromegaly present.  Cardiovascular: Normal rate, regular rhythm and normal heart sounds.  No murmur heard. No BLE edema. Pulmonary/Chest: Effort normal and breath sounds normal. No respiratory distress. She has no wheezes.  Abdominal: Soft. Bowel sounds are normal. She exhibits no distension. There is no tenderness. no masses Musculoskeletal: Normal range of motion, no joint effusions. No gross deformities GU: defer to gyn Neurological: She is alert and oriented to person, place, and time. No cranial nerve deficit. Coordination normal.  Skin: Skin is warm and dry. No rash noted. No erythema.  Psychiatric: She has a normal mood and affect. Her behavior is normal. Judgment and thought content normal.   Lab Results  Component Value Date   WBC 7.7 08/11/2011   HGB 13.3 08/11/2011   HCT 39.4 08/11/2011   PLT 317.0 08/11/2011   GLUCOSE 84 08/11/2011   CHOL 189 08/11/2011   TRIG 84.0 08/11/2011   HDL 40.10 08/11/2011   LDLDIRECT 149.7 08/06/2010   LDLCALC 132* 08/11/2011   ALT 13 08/11/2011   AST 9 08/11/2011  NA 140 08/11/2011   K 3.9 08/11/2011   CL 107 08/11/2011   CREATININE 0.8 08/11/2011   BUN 14 08/11/2011   CO2 25 08/11/2011   TSH 2.11 08/11/2011   HGBA1C 5.6 01/05/2011       Assessment & Plan:  CPX/v70.0 - Patient has been counseled on age-appropriate routine health concerns for screening and prevention. These are reviewed and up-to-date. Immunizations are up-to-date or declined. Labs reviewed.

## 2011-09-09 ENCOUNTER — Other Ambulatory Visit: Payer: Self-pay | Admitting: Internal Medicine

## 2011-11-16 ENCOUNTER — Ambulatory Visit: Payer: 59 | Admitting: Internal Medicine

## 2012-02-12 ENCOUNTER — Other Ambulatory Visit: Payer: Self-pay | Admitting: Internal Medicine

## 2012-03-13 ENCOUNTER — Ambulatory Visit: Payer: 59 | Admitting: Nurse Practitioner

## 2012-03-13 ENCOUNTER — Telehealth: Payer: Self-pay | Admitting: Internal Medicine

## 2012-03-13 NOTE — Telephone Encounter (Signed)
Spoke with patient and she went to [facility] ED. They told her she has gastritis, GERD, She had labs drawn and was told if they were low they would admit her. She was able to go home but she does not know what her labs were. She was also given a drink "to coat her stomach" which helped for a few minutes but not for long. She would like to schedule OV ASAP. Scheduled patient with Mike Gip, PA on 03/14/12 at 3:30 PM.(she requested PM appointment d/t work schedule)

## 2012-03-13 NOTE — Telephone Encounter (Signed)
Patient calling to report for the last week, she has had burning in her stomach after eating. She has been using Tums. She also reports diarrhea x 2 yesterday and for the last 2 weeks she reports alternating diarrhea and constipation. This AM, she reports she vomited x 1 and it had a small amount of bright, red blood in it. States her stomach is bloated and hard.  Patient instructed to go to ED to be evaluated. Patient agrees to do this.

## 2012-03-14 ENCOUNTER — Encounter: Payer: Self-pay | Admitting: Physician Assistant

## 2012-03-14 ENCOUNTER — Telehealth: Payer: Self-pay | Admitting: Internal Medicine

## 2012-03-14 ENCOUNTER — Ambulatory Visit (INDEPENDENT_AMBULATORY_CARE_PROVIDER_SITE_OTHER): Payer: 59 | Admitting: Physician Assistant

## 2012-03-14 VITALS — BP 126/72 | HR 88 | Ht 65.0 in | Wt 296.0 lb

## 2012-03-14 DIAGNOSIS — K219 Gastro-esophageal reflux disease without esophagitis: Secondary | ICD-10-CM

## 2012-03-14 DIAGNOSIS — K209 Esophagitis, unspecified without bleeding: Secondary | ICD-10-CM

## 2012-03-14 MED ORDER — ESOMEPRAZOLE MAGNESIUM 40 MG PO CPDR: 40.0000 mg | DELAYED_RELEASE_CAPSULE | Freq: Every day | ORAL | Status: DC

## 2012-03-14 NOTE — Telephone Encounter (Signed)
Received records from [facility]. Sent to Dr. Juanda Chance. 03/14/12/sd

## 2012-03-14 NOTE — Telephone Encounter (Signed)
Received 12 pages from [facility]. Sent to Dr. Juanda Chance. 03/14/12/sd

## 2012-03-14 NOTE — Telephone Encounter (Signed)
Received 2 pages from [facility], sent to Dr. Juanda Chance. 03/14/12/sd

## 2012-03-14 NOTE — Progress Notes (Signed)
Subjective:    Patient ID: [name], female    DOB: October 07, 1970, 41 y.o.   MRN: [id number]  HPI  [name] is a very nice 41 year old white female known to Dr. Lina Sar with history of GERD and mild esophagitis noted on prior endoscopy 2010. She also had colonoscopy in 2010 which showed mild left-sided diverticulosis. Patient comes in today after onset of symptoms last week with burning discomfort on swallowing felt in her chest and in her stomach with each meal. Yesterday she developed sharp pain in her left upper quadrant which she had never felt before and went to the emergency room at [facility] for evaluation. We have copies of those labs with normal CBC and cmet-she did not have any imaging done She was given a prescription for omeprazole and asked to follow up with GI. She says she has had some changes in her bowel habits over the past couple weeks as well with occasional days of diarrhea and other days noting Carter stools which appeared to be greenish. She has had intermittent nausea yesterday he vomited a very small amount of phlegm which contained a couple of streaks of red blood. Has not seen any further blood since no melena or hematochezia. No fever or chills. Her appetite has been decreased over the past couple of days but weight has been stable. She is not on any regular aspirin or NSAIDs. No recent antibiotics or changes in her usual meds. She had been taking phentermine but stopped this about 3 months ago. She's been taking some TUMS which helps short-term. She's not been on any chronic PPI therapy. She also mentions that she has required sour brash and nocturnal symptoms with sour brash as well. Review of her chart shows that she did have an an abdominal ultrasound and CCK HIDA scan a few years back both of which were negative.    Review of Systems  Constitutional: Negative.   HENT: Positive for trouble swallowing and voice change.   Cardiovascular: Negative.    Gastrointestinal: Positive for nausea and vomiting.  Genitourinary: Negative.   Musculoskeletal: Negative.   Skin: Negative.   Neurological: Negative.   Hematological: Negative.   Psychiatric/Behavioral: Negative.    Outpatient Prescriptions Prior to Visit  Medication Sig Dispense Refill  . albuterol (PROAIR HFA) 108 (90 BASE) MCG/ACT inhaler Inhale 2 puffs into the lungs every 4 (four) hours as needed.        . ALPRAZolam (XANAX) 0.5 MG tablet Take 0.5 mg by mouth every 8 (eight) hours as needed.        . calcium carbonate (TUMS - DOSED IN MG ELEMENTAL CALCIUM) 500 MG chewable tablet Chew 2 tablets by mouth daily.        . metFORMIN (GLUCOPHAGE-XR) 500 MG 24 hr tablet TAKE 1 TABLET (500 MG TOTAL) BY MOUTH DAILY WITH BREAKFAST.  30 tablet  11  . PARoxetine (PAXIL) 10 MG tablet TAKE 1 TABLET EVERY DAY  30 tablet  6  . promethazine (PHENERGAN) 25 MG tablet 1 BY MOUTH EVERY 4 HOURS AS NEEDED FOR NAUSEA  30 tablet  0  . acetaminophen (TYLENOL) 500 MG tablet Take 500 mg by mouth as needed.        [name] alum & mag hydroxide-simeth (MAALOX/MYLANTA) 200-200-20 MG/5ML suspension Take 30 mLs by mouth every 6 (six) hours as needed.      [name] ibuprofen (ADVIL,MOTRIN) 200 MG tablet Take 200 mg by mouth as needed.        . lidocaine (  XYLOCAINE) 2 % solution Take 15 mLs by mouth as needed.      . pantoprazole (PROTONIX) 40 MG tablet Take 40 mg by mouth daily.      . phentermine 37.5 MG capsule Take 1 capsule (37.5 mg total) by mouth every morning.  30 capsule  0   No Known Allergies    Patient Active Problem List  Diagnosis  . POLYCYSTIC OVARIAN DISEASE  . OBESITY  . SMOKER  . DEPRESSION  . OBSTRUCTIVE SLEEP APNEA  . ASTHMA  . GERD  . DELAYED GASTRIC EMPTYING   Family History  Problem Relation Age of Onset  . Breast cancer Mother     dx 11/2009  . Diabetes Maternal Aunt   . Prostate cancer Maternal Grandfather 50    also hx colon polyps  . Heart disease Paternal Grandmother   . Pancreatic  cancer Paternal Grandmother 5    Objective:   Physical Exam  well-developed obese white female in no acute distress, pleasant blood pressure 126/72 pulse 88 height 5 foot 5 weight 296. HEENT; nontraumatic normocephalic EOMI PERRLA sclera anicteric,Neck; Supple no JVD, Cardiovascular; regular rate and rhythm with S1-S2 no murmur or gallop, Pulmonary; clear bilaterally, Abdomen; large soft mildly tender in the epigastrium there is no guarding or rebound no palpable mass or hepatosplenomegaly bowel sounds are active, Rectal; exam not done, Extremities; no clubbing cyanosis or edema skin warm dry, Psych; Loreta mood and affect normal and appropriate        Assessment & Plan:  #50  41 year old female with probable acute esophagitis, an underlying chronic GERD. #2 obesity #3 apnea #4 pressure #5 polycystic ovarian disease #6  tobacco use  Plan; chart Nexium 40 mg by mouth twice daily x2 weeks then once daily chronically. She was given samples today and a prescription. Antireflux regimen was reviewed and she was also provided with educational material Plan followup office visit with Dr. Juanda Chance or myself in 3-4 weeks, and she knows to call in the interim if her symptoms worsen

## 2012-03-14 NOTE — Patient Instructions (Addendum)
We sent a prescription for Nexium 40 mg once daily to [facility] [place], [place].   We have given you samples to supplement, Take twice daily for 14 days. Follow up with Dr. Juanda Chance or Amy in January.    We have given you Reflux information.

## 2012-03-14 NOTE — Progress Notes (Signed)
Reviewed and agree with increasing antireflux regimen

## 2012-05-17 ENCOUNTER — Telehealth: Payer: Self-pay | Admitting: *Deleted

## 2012-05-17 DIAGNOSIS — Z0000 Encounter for general adult medical examination without abnormal findings: Secondary | ICD-10-CM

## 2012-05-17 NOTE — Telephone Encounter (Signed)
Received staff msg pt made cpx for May. Need cpx labs entered...lmb

## 2012-05-17 NOTE — Telephone Encounter (Signed)
Message copied by Deatra James on Thu May 17, 2012 11:05 AM ------      Message from: Etheleen Sia      Created: Thu May 17, 2012 10:56 AM      Regarding: LABS       PHYSICAL LABS FOR MAY

## 2012-08-16 ENCOUNTER — Other Ambulatory Visit (INDEPENDENT_AMBULATORY_CARE_PROVIDER_SITE_OTHER): Payer: 59

## 2012-08-16 DIAGNOSIS — Z0000 Encounter for general adult medical examination without abnormal findings: Secondary | ICD-10-CM | POA: Diagnosis not present

## 2012-08-16 LAB — URINALYSIS, ROUTINE W REFLEX MICROSCOPIC
Bilirubin Urine: NEGATIVE
Hgb urine dipstick: NEGATIVE
Ketones, ur: NEGATIVE
Leukocytes, UA: NEGATIVE
Nitrite: NEGATIVE
Specific Gravity, Urine: 1.025 (ref 1.000–1.030)
Total Protein, Urine: NEGATIVE
Urine Glucose: NEGATIVE
Urobilinogen, UA: 0.2 (ref 0.0–1.0)
pH: 6 (ref 5.0–8.0)

## 2012-08-16 LAB — HEPATIC FUNCTION PANEL
ALT: 13 U/L (ref 0–35)
AST: 9 U/L (ref 0–37)
Albumin: 3.6 g/dL (ref 3.5–5.2)
Alkaline Phosphatase: 71 U/L (ref 39–117)
Bilirubin, Direct: 0.1 mg/dL (ref 0.0–0.3)
Total Bilirubin: 0.4 mg/dL (ref 0.3–1.2)
Total Protein: 7.3 g/dL (ref 6.0–8.3)

## 2012-08-16 LAB — CBC WITH DIFFERENTIAL/PLATELET
Basophils Absolute: 0 10*3/uL (ref 0.0–0.1)
Basophils Relative: 0.4 % (ref 0.0–3.0)
Eosinophils Absolute: 0.1 10*3/uL (ref 0.0–0.7)
Eosinophils Relative: 0.9 % (ref 0.0–5.0)
HCT: 38.5 % (ref 36.0–46.0)
Hemoglobin: 12.9 g/dL (ref 12.0–15.0)
Lymphocytes Relative: 24.7 % (ref 12.0–46.0)
Lymphs Abs: 2.9 10*3/uL (ref 0.7–4.0)
MCHC: 33.6 g/dL (ref 30.0–36.0)
MCV: 89.9 fl (ref 78.0–100.0)
Monocytes Absolute: 0.6 10*3/uL (ref 0.1–1.0)
Monocytes Relative: 5.1 % (ref 3.0–12.0)
Neutro Abs: 8.2 10*3/uL — ABNORMAL HIGH (ref 1.4–7.7)
Neutrophils Relative %: 68.9 % (ref 43.0–77.0)
Platelets: 319 10*3/uL (ref 150.0–400.0)
RBC: 4.28 Mil/uL (ref 3.87–5.11)
RDW: 13.3 % (ref 11.5–14.6)
WBC: 11.9 10*3/uL — ABNORMAL HIGH (ref 4.5–10.5)

## 2012-08-16 LAB — LIPID PANEL
Cholesterol: 207 mg/dL — ABNORMAL HIGH (ref 0–200)
HDL: 34.2 mg/dL — ABNORMAL LOW (ref >39.00)
Total CHOL/HDL Ratio: 6
Triglycerides: 125 mg/dL (ref 0.0–149.0)
VLDL: 25 mg/dL (ref 0.0–40.0)

## 2012-08-16 LAB — LDL CHOLESTEROL, DIRECT: Direct LDL: 154.1 mg/dL

## 2012-08-16 LAB — BASIC METABOLIC PANEL
BUN: 12 mg/dL (ref 6–23)
CO2: 26 mEq/L (ref 19–32)
Calcium: 8.7 mg/dL (ref 8.4–10.5)
Chloride: 104 mEq/L (ref 96–112)
Creatinine, Ser: 1 mg/dL (ref 0.4–1.2)
GFR: 69.03 mL/min (ref >60.00)
Glucose, Bld: 83 mg/dL (ref 70–99)
Potassium: 3.9 mEq/L (ref 3.5–5.1)
Sodium: 138 mEq/L (ref 135–145)

## 2012-08-16 LAB — TSH: TSH: 3.67 u[IU]/mL (ref 0.35–5.50)

## 2012-08-23 ENCOUNTER — Encounter: Payer: 59 | Admitting: Internal Medicine

## 2012-09-11 ENCOUNTER — Other Ambulatory Visit: Payer: Self-pay | Admitting: Internal Medicine

## 2012-09-13 ENCOUNTER — Other Ambulatory Visit: Payer: Self-pay | Admitting: Internal Medicine

## 2012-10-16 ENCOUNTER — Telehealth: Payer: Self-pay | Admitting: *Deleted

## 2012-10-16 NOTE — Telephone Encounter (Signed)
Pt called requesting a refill of Paxil until her 7.28.14 OV.  Please advise

## 2012-10-16 NOTE — Telephone Encounter (Signed)
Spoke with pt advised her of MD message.

## 2012-10-16 NOTE — Telephone Encounter (Signed)
This was just done with 5 refills on 09/11/12

## 2012-10-31 ENCOUNTER — Encounter: Payer: Self-pay | Admitting: Internal Medicine

## 2012-10-31 ENCOUNTER — Encounter: Payer: 59 | Admitting: Internal Medicine

## 2012-10-31 ENCOUNTER — Ambulatory Visit (INDEPENDENT_AMBULATORY_CARE_PROVIDER_SITE_OTHER): Payer: 59 | Admitting: Internal Medicine

## 2012-10-31 VITALS — BP 110/84 | HR 84 | Temp 98.0°F | Ht 65.0 in | Wt 304.8 lb

## 2012-10-31 DIAGNOSIS — Z0000 Encounter for general adult medical examination without abnormal findings: Secondary | ICD-10-CM

## 2012-10-31 DIAGNOSIS — F419 Anxiety disorder, unspecified: Secondary | ICD-10-CM | POA: Insufficient documentation

## 2012-10-31 DIAGNOSIS — E669 Obesity, unspecified: Secondary | ICD-10-CM

## 2012-10-31 DIAGNOSIS — F411 Generalized anxiety disorder: Secondary | ICD-10-CM

## 2012-10-31 DIAGNOSIS — E282 Polycystic ovarian syndrome: Secondary | ICD-10-CM

## 2012-10-31 MED ORDER — PAROXETINE HCL 20 MG PO TABS: 20.0000 mg | ORAL_TABLET | ORAL | Status: DC

## 2012-10-31 MED ORDER — METFORMIN HCL ER 500 MG PO TB24: ORAL_TABLET | ORAL | Status: DC

## 2012-10-31 MED ORDER — ALBUTEROL SULFATE HFA 108 (90 BASE) MCG/ACT IN AERS: 2.0000 | INHALATION_SPRAY | RESPIRATORY_TRACT | Status: DC | PRN

## 2012-10-31 MED ORDER — ALPRAZOLAM 0.5 MG PO TABS: 0.5000 mg | ORAL_TABLET | Freq: Three times a day (TID) | ORAL | Status: DC | PRN

## 2012-10-31 MED ORDER — PAROXETINE HCL 10 MG PO TABS: ORAL_TABLET | ORAL | Status: DC

## 2012-10-31 NOTE — Assessment & Plan Note (Signed)
Increased symptoms related to situational stressors - unemployment, spouse illness, dtr's graduation Increase dose paxil recommended and ok for prn xanax Verified no SI/HI, extensive emotional support offered today, pt declines need for counseling due to $ concerns

## 2012-10-31 NOTE — Assessment & Plan Note (Signed)
Follows with gyn for same - long hx ammenorrhea but resumed cycle briefly after starting metformin 11/2010-  Continue same Check a1c to monitor insulin resistance in setting of weight gain Lab Results  Component Value Date   HGBA1C 5.6 01/05/2011

## 2012-10-31 NOTE — Progress Notes (Signed)
Subjective:    Patient ID: [name], female    DOB: 11-Jan-1971, 42 y.o.   MRN: [id number]  HPI Patient is here today for annual physical. Today, chronic medical conditions, medications, labs, and health maintenance tasks were reviewed. Patient has also been experiencing situational anxiety over the past several months with several events including losing a job, illness in husband, and daughter graduating. She has also concerns about her weight gain that has occurred during the recent stressful events.  Past Medical History  Diagnosis Date  . POLYCYSTIC OVARIAN DISEASE   . OBESITY   . SMOKER   . OBSTRUCTIVE SLEEP APNEA   . DELAYED GASTRIC EMPTYING   . DEPRESSION   . GERD   . Anxiety   . ASTHMA    Family History  Problem Relation Age of Onset  . Breast cancer Mother     dx 11/2009  . Diabetes Maternal Aunt   . Prostate cancer Maternal Grandfather 51    also hx colon polyps  . Heart disease Paternal Grandmother   . Pancreatic cancer Paternal Grandmother 41    History  Substance Use Topics  . Smoking status: Current Some Day Smoker  . Smokeless tobacco: Never Used     Comment: Lives with husband & 2 kids from 1st marriage. (1st husband expired 1998-leukemia) work as Engineer, structural, but currently unemployed  . Alcohol Use: Yes     Comment: Rarely  Started a new job last week.   Review of Systems  Respiratory: Negative.  Negative for shortness of breath and wheezing.   Cardiovascular: Negative.  Negative for chest pain and leg swelling.  Gastrointestinal: Negative.  Negative for abdominal pain.  Musculoskeletal: Negative for arthralgias and gait problem.  Skin: Negative for rash.  Neurological: Negative for dizziness and headaches.        Objective:   Physical Exam  Vitals reviewed. Constitutional: She is oriented to person, place, and time. She appears well-developed and well-nourished.  HENT:  Head: Normocephalic and atraumatic.  TM's clear bilaterally   Neck: Normal range of motion. Neck supple. No tracheal deviation present. No thyromegaly present.  Cardiovascular: Normal rate and regular rhythm.  Exam reveals no friction rub.   No murmur heard. Pulmonary/Chest: Breath sounds normal. She has no wheezes. She has no rales.  Abdominal: Soft. There is no tenderness.  Lymphadenopathy:    She has no cervical adenopathy.  Neurological: She is alert and oriented to person, place, and time.  Skin: Skin is dry.  Psychiatric:  Mildly anxious, appropriately tearful when talking about recent life events.    Lab Results  Component Value Date   WBC 11.9* 08/16/2012   HGB 12.9 08/16/2012   HCT 38.5 08/16/2012   PLT 319.0 08/16/2012   GLUCOSE 83 08/16/2012   CHOL 207* 08/16/2012   TRIG 125.0 08/16/2012   HDL 34.20* 08/16/2012   LDLDIRECT 154.1 08/16/2012   LDLCALC 132* 08/11/2011   ALT 13 08/16/2012   AST 9 08/16/2012   NA 138 08/16/2012   K 3.9 08/16/2012   CL 104 08/16/2012   CREATININE 1.0 08/16/2012   BUN 12 08/16/2012   CO2 26 08/16/2012   TSH 3.67 08/16/2012   HGBA1C 5.6 01/05/2011          Assessment & Plan:  CPX- chronic medical conditions, medications, labs, and health maintenance issues were addressed and reviewed. Patient is scheduled for mammogram and pap smear in 2 days. All other health maintenance is up to date.  1.  Anxiety- Patient currently reports her situation is improving, as she started a new job last week. Before she had the new job, she barely left her house and was increasing her smoking frequency and eating. She feels better now, but is still quite anxious. Her dose of Paxil will be increased, and the xanax prescription will be renewed for her to take prn for anxiety. Patient is to follow up in 4-6 weeks to assess her clinical status with the medication changes.  2. Obesity- Once patient's emotional status has stabilized, weight loss issue will be addressed. A HbA1C will be checked today; an A1C from 2012 was slightly elevated at 5.6  Concha Se, [place] [facility]ighbor  I have personally reviewed this case with PA student. I also personally examined this patient. I agree with history and findings as documented above. I reviewed, discussed and approve of the assessment and plan as listed above. Rene Paci, MD

## 2012-10-31 NOTE — Assessment & Plan Note (Signed)
Wt Readings from Last 3 Encounters:  10/31/12 304 lb 12.8 oz (138.256 kg)  03/14/12 296 lb (134.265 kg)  08/17/11 289 lb (131.09 kg)   Obstacles related to health reviewed in depth including options for weight loss -  pt will continue to work on diet/exercise efforts used phentermine spring 2013, hold on renewal until emotional issues stabilize

## 2012-10-31 NOTE — Patient Instructions (Signed)
It was good to see you today. Health Maintenance reviewed - all recommended immunizations and age-appropriate screenings are up-to-date.  We have reviewed your prior records including labs and tests today Medications reviewed and updated, increase Paxil dose to 20 mg daily and renew Xanax to use as needed for anxiety or sleep symptoms -no other changes recommended at this time. Test(s) ordered today for next visit to monitor diabetes mellitus. Your results will be released to MyChart (or called to you) after review, usually within 72hours after test completion. If any changes need to be made, you will be notified at that same time. Please schedule followup in 6 weeks for mood and medication check, call sooner if problems.

## 2012-10-31 NOTE — Progress Notes (Signed)
Subjective:    Patient ID: [name], female    DOB: [birth year], 42 y.o.   MRN: [id number]  HPI  patient [name] here today for annual physical.   Also concerned about increase in situational anxiety - ?med changes  Past Medical History  Diagnosis Date  . POLYCYSTIC OVARIAN DISEASE   . OBESITY   . SMOKER   . OBSTRUCTIVE SLEEP APNEA   . DELAYED GASTRIC EMPTYING   . DEPRESSION   . GERD   . Anxiety   . ASTHMA    Family History  Problem Relation Age of Onset  . Breast cancer Mother     dx 11/2009  . Diabetes Maternal Aunt   . Prostate cancer Maternal Grandfather 41    also hx colon polyps  . Heart disease Paternal Grandmother   . Pancreatic cancer Paternal Grandmother 31   History  Substance Use Topics  . Smoking status: Current Some Day Smoker  . Smokeless tobacco: Never Used     Comment: Lives with husband & 2 kids from 1st marriage. (1st husband expired 1998-leukemia) work as Engineer, structural, but currently unemployed  . Alcohol Use: Yes     Comment: Rarely    Review of Systems  Constitutional: Negative for fever or weight change.  Respiratory: Negative for cough and shortness of breath.   Cardiovascular: Negative for chest pain or palpitations.  Gastrointestinal: Negative for abdominal pain, no bowel changes.  Musculoskeletal: Negative for gait problem or joint swelling.  Skin: Negative for rash.  Neurological: Negative for dizziness or headache.  No other specific complaints in a complete review of systems (except as listed in HPI above).     Objective:   Physical Exam  BP 110/84  Pulse 84  Temp(Src) 98 F (36.7 C) (Oral)  Ht 5\' 5"  (1.651 m)  Wt 304 lb 12.8 oz (138.256 kg)  BMI 50.72 kg/m2  SpO2 97% Wt Readings from Last 3 Encounters:  10/31/12 304 lb 12.8 oz (138.256 kg)  03/14/12 296 lb (134.265 kg)  08/17/11 289 lb (131.09 kg)   Constitutional: [name] overweight but appears well-developed and well-nourished. No distress.  HENT: Head:  Normocephalic and atraumatic. Ears: B TMs ok, no erythema or effusion; Nose: Nose normal. Mouth/Throat: Oropharynx [name] clear and moist. No oropharyngeal exudate.  Eyes: Conjunctivae and EOM are normal. Pupils are equal, round, and reactive to light. No scleral icterus.  Neck: Thick, normal range of motion. Neck supple. No JVD present. No thyromegaly present.  Cardiovascular: Normal rate, regular rhythm and normal heart sounds.  No murmur heard. No BLE edema. Pulmonary/Chest: Effort normal and breath sounds normal. No respiratory distress. Lorraine has no wheezes.  Abdominal: Soft. Bowel sounds are normal. Lorraine exhibits no distension. There [name] no tenderness. no masses Musculoskeletal: Normal range of motion, no joint effusions. No gross deformities GU: defer to gyn Neurological: [name] alert and oriented to person, place, and time. No cranial nerve deficit. Coordination normal.  Skin: Skin [name] warm and dry. No rash noted. No erythema.  Psychiatric: Lorraine has a normal mood and affect. Her behavior [name] normal. Judgment and thought content normal.   Lab Results  Component Value Date   WBC 11.9* 08/16/2012   HGB 12.9 08/16/2012   HCT 38.5 08/16/2012   PLT 319.0 08/16/2012   GLUCOSE 83 08/16/2012   CHOL 207* 08/16/2012   TRIG 125.0 08/16/2012   HDL 34.20* 08/16/2012   LDLDIRECT 154.1 08/16/2012   LDLCALC 132* 08/11/2011   ALT 13 08/16/2012  AST 9 08/16/2012   NA 138 08/16/2012   K 3.9 08/16/2012   CL 104 08/16/2012   CREATININE 1.0 08/16/2012   BUN 12 08/16/2012   CO2 26 08/16/2012   TSH 3.67 08/16/2012   HGBA1C 5.6 01/05/2011       Assessment & Plan:  CPX/v70.0 - Patient has been counseled on age-appropriate routine health concerns for screening and prevention. These are reviewed and up-to-date. Immunizations are up-to-date or declined. Labs reviewed.  Also See problem list. Medications and labs reviewed today.

## 2012-11-05 ENCOUNTER — Other Ambulatory Visit: Payer: Self-pay | Admitting: Internal Medicine

## 2012-11-06 ENCOUNTER — Other Ambulatory Visit: Payer: Self-pay | Admitting: *Deleted

## 2012-11-06 MED ORDER — PROMETHAZINE HCL 25 MG PO TABS: ORAL_TABLET | ORAL | Status: DC

## 2012-11-06 NOTE — Telephone Encounter (Signed)
Requesting [facility]ill on [facility]...lmb

## 2012-12-12 ENCOUNTER — Encounter: Payer: Self-pay | Admitting: Internal Medicine

## 2012-12-12 ENCOUNTER — Other Ambulatory Visit (INDEPENDENT_AMBULATORY_CARE_PROVIDER_SITE_OTHER): Payer: 59

## 2012-12-12 ENCOUNTER — Ambulatory Visit (INDEPENDENT_AMBULATORY_CARE_PROVIDER_SITE_OTHER): Payer: 59 | Admitting: Internal Medicine

## 2012-12-12 VITALS — BP 120/80 | HR 81 | Temp 98.7°F | Wt 303.1 lb

## 2012-12-12 DIAGNOSIS — E669 Obesity, unspecified: Secondary | ICD-10-CM

## 2012-12-12 DIAGNOSIS — F411 Generalized anxiety disorder: Secondary | ICD-10-CM

## 2012-12-12 DIAGNOSIS — E282 Polycystic ovarian syndrome: Secondary | ICD-10-CM

## 2012-12-12 DIAGNOSIS — R110 Nausea: Secondary | ICD-10-CM

## 2012-12-12 LAB — HEPATIC FUNCTION PANEL
ALT: 13 U/L (ref 0–35)
AST: 8 U/L (ref 0–37)
Albumin: 3.7 g/dL (ref 3.5–5.2)
Alkaline Phosphatase: 67 U/L (ref 39–117)
Bilirubin, Direct: 0 mg/dL (ref 0.0–0.3)
Total Bilirubin: 0.5 mg/dL (ref 0.3–1.2)
Total Protein: 7 g/dL (ref 6.0–8.3)

## 2012-12-12 LAB — HEMOGLOBIN A1C: Hgb A1c MFr Bld: 5.8 % (ref 4.6–6.5)

## 2012-12-12 MED ORDER — PROMETHAZINE HCL 25 MG PO TABS: ORAL_TABLET | ORAL | Status: DC

## 2012-12-12 NOTE — Patient Instructions (Signed)
It was good to see you today. Keep working on your weight loss! Keep up the good work and efforts Medications reviewed and updated, no changes recommended at this time. Test(s) ordered today. Your results will be released to MyChart (or called to you) after review, usually within 72hours after test completion. If any changes need to be made, you will be notified at that same time. Please schedule followup in 3-4 months for mood and weight check, call sooner if problems.

## 2012-12-12 NOTE — Assessment & Plan Note (Signed)
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

## 2012-12-12 NOTE — Progress Notes (Signed)
Subjective:  Patient here for follow up of mood/medication changes.  Anxiety- States she has noticed an improvement in mood and anxiety levels since Paxil dose increased to 20mg  daily and Xanax 0.5mg  q8 hours added 10-2012.  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  She is working 2nd shift and has concerns about her persisting fatigue. She reports sleeping 11-12 hours a day with no energy outside of working.  Nausea- she has had nausea for many years and has had prn Phenergan for this. She reports and increase in nausea over the last couple of weeks and is having to take Phenergan twice weekly.  She denies vomiting, constipation, diarrhea, abdominal pain, but does report green colored stools which is a change.     Obesity- weight unchanged since last visit.  HgbA1C not drawn (patient was unsure of when to go to lab).  Past Medical History  Diagnosis Date  . POLYCYSTIC OVARIAN DISEASE   . OBESITY   . SMOKER   . OBSTRUCTIVE SLEEP APNEA   . DELAYED GASTRIC EMPTYING   . DEPRESSION   . GERD   . Anxiety   . ASTHMA    Review of symptoms: Constitutional: denies recent fever or chills, unexplained weight loss or weight gain Gastrointestinal: Positive for stool changes - now green colored.  Denies nausea, vomiting, constipation, diarrhea, abdominal pain.   Psych: Reports mood/tearfulness/anxiety improved.    Objective:  Physical Exam  BP 120/80  Pulse 81  Temp(Src) 98.7 F (37.1 C) (Oral)  Wt 303 lb 1.9 oz (137.494 kg)  BMI 50.44 kg/m2  SpO2 97% Wt Readings from Last 3 Encounters:  12/12/12 303 lb 1.9 oz (137.494 kg)  10/31/12 304 lb 12.8 oz (138.256 kg)  03/14/12 296 lb (134.265 kg)   Constitutional: She is obese, but oriented to person, place, and time. She appears well-developed and well-nourished.  Cardiovascular: Normal rate and regular rhythm. Exam reveals no friction rub. No murmur heard.  Pulmonary/Chest: Breath sounds normal. Normal work of  breathing. No wheezing  Abdominal: Soft. There is no tenderness.  Neurological: She is alert and oriented to person, place, and time.  Skin: Skin is dry.  Psychiatric: Normal mood and affect.   Lab Results  Component Value Date   WBC 11.9* 08/16/2012   HGB 12.9 08/16/2012   HCT 38.5 08/16/2012   PLT 319.0 08/16/2012   GLUCOSE 83 08/16/2012   CHOL 207* 08/16/2012   TRIG 125.0 08/16/2012   HDL 34.20* 08/16/2012   LDLDIRECT 154.1 08/16/2012   LDLCALC 132* 08/11/2011   ALT 13 08/16/2012   AST 9 08/16/2012   NA 138 08/16/2012   K 3.9 08/16/2012   CL 104 08/16/2012   CREATININE 1.0 08/16/2012   BUN 12 08/16/2012   CO2 26 08/16/2012   TSH 3.67 08/16/2012   HGBA1C 5.6 01/05/2011   A/P:  See problem list. Medications and labs reviewed today.  Nausea. Chronic history of same in setting of prior diagnosis delayed gastric emptying. Okay for. Ferritin. Exam benign. Recheck labs and consider abdominal ultrasound if persisting symptoms with normal labs (last done 2009, report reviewed in EMR today)

## 2012-12-12 NOTE — Assessment & Plan Note (Addendum)
Patient reports decreased symptoms of anxiety/tearfulness since Paxil dose increased and Xanax added 10/2012 Still with persistent fatigue, but improved The current medical regimen is generally effective;  continue present plan and medications.  Support offered today

## 2012-12-12 NOTE — Assessment & Plan Note (Signed)
Follows with gyn for same - long hx ammenorrhea but resumed cycle briefly after starting metformin 11/2010- she missed visit with GYN earlier this month for follow-up, appt rescheduled for end of September Continue same Check a1c to monitor insulin resistance in setting of weight gain - not drawn at last visit, will draw today.  Lab Results  Component Value Date   HGBA1C 5.6 01/05/2011

## 2012-12-25 ENCOUNTER — Encounter: Payer: Self-pay | Admitting: *Deleted

## 2012-12-25 ENCOUNTER — Emergency Department (INDEPENDENT_AMBULATORY_CARE_PROVIDER_SITE_OTHER): Payer: 59

## 2012-12-25 ENCOUNTER — Emergency Department (INDEPENDENT_AMBULATORY_CARE_PROVIDER_SITE_OTHER)
Admission: EM | Admit: 2012-12-25 | Discharge: 2012-12-25 | Disposition: A | Discharge status: Home / Self Care | Payer: 59 | Source: Home / Self Care | Attending: Family Medicine | Admitting: Family Medicine

## 2012-12-25 DIAGNOSIS — J029 Acute pharyngitis, unspecified: Secondary | ICD-10-CM

## 2012-12-25 DIAGNOSIS — R1310 Dysphagia, unspecified: Secondary | ICD-10-CM

## 2012-12-25 DIAGNOSIS — R062 Wheezing: Secondary | ICD-10-CM

## 2012-12-25 DIAGNOSIS — K122 Cellulitis and abscess of mouth: Secondary | ICD-10-CM

## 2012-12-25 LAB — POCT RAPID STREP A (OFFICE): Rapid Strep A Screen: NEGATIVE

## 2012-12-25 MED ORDER — AMOXICILLIN-POT CLAVULANATE 875-125 MG PO TABS: 1.0000 | ORAL_TABLET | Freq: Two times a day (BID) | ORAL | Status: DC

## 2012-12-25 MED ORDER — PREDNISONE 50 MG PO TABS: ORAL_TABLET | ORAL | Status: DC

## 2012-12-25 MED ORDER — METHYLPREDNISOLONE SODIUM SUCC 125 MG IJ SOLR: 125.0000 mg | Freq: Once | INTRAMUSCULAR | Status: DC

## 2012-12-25 MED ORDER — CEFTRIAXONE SODIUM 1 G IJ SOLR: 1.0000 g | Freq: Once | INTRAMUSCULAR | Status: DC

## 2012-12-25 NOTE — ED Provider Notes (Signed)
CSN: [id number]     Arrival date & time 12/25/12  1816 History   First MD Initiated Contact with Patient 12/25/12 1816     Chief Complaint  Patient presents with  . Sore Throat  . Fever    HPI  SORE THROAT  Onset: 1-2 days  Description: sore throat, subjective fever, trouble swallowing  Modifying factors: intermittent smoker   Symptoms  Fever:  Subjective at home URI symptoms: minimal  Cough: no Headache: no Rash:  no Swollen glands:   mild Recent Strep Exposure: no LUQ pain: no Heartburn/brash: no Allergy Symptoms: no  Red Flags STD exposure: no Breathing difficulty: painful swallowing Drooling: no Trismus: no   Past Medical History  Diagnosis Date  . POLYCYSTIC OVARIAN DISEASE   . OBESITY   . SMOKER   . OBSTRUCTIVE SLEEP APNEA   . DELAYED GASTRIC EMPTYING   . DEPRESSION   . GERD   . Anxiety   . ASTHMA    Past Surgical History  Procedure Laterality Date  . Cocxyl removal  1995  . Dilation and curettage of uterus    . Tubal ligation  1998  . Cesarean section  97 & 98    x's 2  . Laparoscopy     Family History  Problem Relation Age of Onset  . Breast cancer Mother     dx 11/2009  . Diabetes Maternal Aunt   . Prostate cancer Maternal Grandfather 75    also hx colon polyps  . Heart disease Paternal Grandmother   . Pancreatic cancer Paternal Grandmother 66   History  Substance Use Topics  . Smoking status: Current Some Day Smoker  . Smokeless tobacco: Never Used     Comment: Lives with husband & 2 kids from 1st marriage. (1st husband expired 1998-leukemia) work as Engineer, structural, but currently unemployed  . Alcohol Use: No     Comment: Rarely   OB History   Grav Para Term Preterm Abortions TAB SAB Ect Mult Living                 Review of Systems  All other systems reviewed and are negative.    Allergies  Review of patient's allergies indicates no known allergies.  Home Medications   Current Outpatient Rx  Name  Route  Sig   Dispense  Refill  . albuterol (PROAIR HFA) 108 (90 BASE) MCG/ACT inhaler   Inhalation   Inhale 2 puffs into the lungs every 4 (four) hours as needed.   18 g   2   . ALPRAZolam (XANAX) 0.5 MG tablet   Oral   Take 1 tablet (0.5 mg total) by mouth every 8 (eight) hours as needed for sleep or anxiety.   30 tablet   1   . amoxicillin-clavulanate (AUGMENTIN) 875-125 MG per tablet   Oral   Take 1 tablet by mouth 2 (two) times daily.   20 tablet   0   . calcium carbonate (TUMS - DOSED IN MG ELEMENTAL CALCIUM) 500 MG chewable tablet   Oral   Chew 2 tablets by mouth daily.           [name] esomeprazole (NEXIUM) 40 MG capsule   Oral   Take 1 capsule (40 mg total) by mouth daily before breakfast.   30 capsule   3   . metFORMIN (GLUCOPHAGE-XR) 500 MG 24 hr tablet      TAKE 1 TABLET (500 MG TOTAL) BY MOUTH DAILY WITH BREAKFAST.   30  tablet   11   . PARoxetine (PAXIL) 20 MG tablet   Oral   Take 1 tablet (20 mg total) by mouth every morning.   30 tablet   11   . promethazine (PHENERGAN) 25 MG tablet      1 BY MOUTH EVERY 4 HOURS AS NEEDED FOR NAUSEA   30 tablet   0    BP 117/84  Pulse 106  Temp(Src) 98.2 F (36.8 C) (Oral)  Resp 18  Ht 5\' 5"  (1.651 m)  Wt 300 lb (136.079 kg)  BMI 49.92 kg/m2  SpO2 99%  LMP 10/24/2012 Physical Exam  Constitutional:  Obese    HENT:  Head: Normocephalic and atraumatic.  Right Ear: External ear normal.  Left Ear: External ear normal.  + tonsillar erythema + uvular erythema without deviation; + uvular lesion, no purulence or exudate  Cardiovascular: Normal rate and regular rhythm.   Pulmonary/Chest: Effort normal. She has wheezes.  Abdominal: Soft. Bowel sounds are normal.  Musculoskeletal: Normal range of motion.  Neurological: She is alert.  Skin: Skin is warm.    ED Course  Procedures (including critical care time) Labs Review Labs Reviewed  POCT RAPID STREP A (OFFICE)   Imaging Review Dg Neck Soft Tissue  12/25/2012    CLINICAL DATA:  Difficulty swallowing. Evaluate for epiglottitis.  EXAM: NECK SOFT TISSUES - 1+ VIEW  COMPARISON:  No priors.  FINDINGS: Study is limited by oblique projection. With these limitations in mind, the nasopharynx, oropharynx, epiglottis and subglottic airway appear normal. Prevertebral soft tissues are normal.  IMPRESSION: 1. No imaging findings to suggest epiglottitis at this time.   Electronically Signed   By: Trudie Reed M.D.   On: 12/25/2012 19:36    MDM   1. Pharyngitis   2. Uvulitis   3. Wheezing    Rapid strep negative. Solu-Medrol 125 IM x1 Rocephin 1 g IM x1. Augmentin for soft tissue coverage including uvula and posterior oropharynx. Short course of prednisone. No signs of airway compromise clinically. Good oxygenation. Speaking in full sentences. Lateral neck soft tissue x-ray within normal limits. No red flags her exam including drooling trismus or anterior neck tenderness. Discuss infectious and ENT red flags at length patient including increased work of breathing progressive trouble swallowing.    The patient and/or caregiver has been counseled thoroughly with regard to treatment plan and/or medications prescribed including dosage, schedule, interactions, rationale for use, and possible side effects and they verbalize understanding. Diagnoses and expected course of recovery discussed and will return if not improved as expected or if the condition worsens. Patient and/or caregiver verbalized understanding.         Doree Albee, MD 12/25/12 1950

## 2012-12-25 NOTE — ED Notes (Signed)
Pt c/o sore throat, fever, nausea and LT ear ache x 2 days. She has taken aleve.

## 2013-01-08 ENCOUNTER — Other Ambulatory Visit: Payer: Self-pay | Admitting: Obstetrics and Gynecology

## 2013-03-13 ENCOUNTER — Ambulatory Visit: Payer: 59 | Admitting: Internal Medicine

## 2013-05-21 ENCOUNTER — Ambulatory Visit (INDEPENDENT_AMBULATORY_CARE_PROVIDER_SITE_OTHER): Payer: 59 | Admitting: Neurology

## 2013-05-21 ENCOUNTER — Encounter: Payer: Self-pay | Admitting: Neurology

## 2013-05-21 VITALS — BP 144/88 | HR 93 | Temp 97.6°F | Ht 65.5 in | Wt 302.0 lb

## 2013-05-21 DIAGNOSIS — R51 Headache: Secondary | ICD-10-CM

## 2013-05-21 DIAGNOSIS — H4710 Unspecified papilledema: Secondary | ICD-10-CM

## 2013-05-21 DIAGNOSIS — G932 Benign intracranial hypertension: Secondary | ICD-10-CM

## 2013-05-21 DIAGNOSIS — G4733 Obstructive sleep apnea (adult) (pediatric): Secondary | ICD-10-CM

## 2013-05-21 DIAGNOSIS — F40240 Claustrophobia: Secondary | ICD-10-CM

## 2013-05-21 DIAGNOSIS — F40298 Other specified phobia: Secondary | ICD-10-CM

## 2013-05-21 DIAGNOSIS — R519 Headache, unspecified: Secondary | ICD-10-CM

## 2013-05-21 NOTE — Patient Instructions (Addendum)
You may have a condition called pseudotumor cerebri, which means that there increased fluid pressure around your brain. 1. We will add a brain MRI and MRV brain 2. We will get a formal eye exam with your eye doctor down the road.  3. We will do a LP with pressure testing and routine fluid testing. We will send you to a radiologist for this.   4. We may consider a medication to help keep your spinal fluid pressure at [place].  5. We are going to do a sleep study.  6. It is very important for you to lose weight.   The most serious complication of having pseudotumor cerebri is loss of vision which can be permanent.

## 2013-05-21 NOTE — Progress Notes (Signed)
Subjective:    Patient ID: [name] is a 43 y.o. female.  HPI    Star Age, MD, PhD [facility] [address] [place], [place] [address]  Dear Drema Dallas,   I saw your patient, [name], upon your kind request in my neurologic clinic today for initial consultation of suspected pseudotumor cerebri. The patient is accompanied by her husband, Barbaraann Rondo, today. As you know, Ms. Amberg is a 43 year old right-handed woman with an underlying medical history of reflux disease, depression, anxiety, obesity, asthma, OSA, not on CPAP, smoking, and PCOS, who presented to your office at [place] on 05/16/2013 with blurry vision. This has been ongoing for about 2 months. Upon examination you found left-sided papilledema concerning for pseudotumor cerebri. You have referred her to neuro-ophthalmology. She has a longstanding Hx of poor vision on the L from amblyopia, which was diagnosed at age 28. She has had headaches for the past 2 months and she has been taking tylenol. She has a L sided headache and has had 2 episodes of b/l posterior head burning in the context of laughter. She has not noted any visual field defect, but has had floaters. She has been gaining weight for the past 3 years.  She has not noted any other focal neurologic symptoms. She has occasional tingling in her fingers.  Her Past Medical History Is Significant For: Past Medical History  Diagnosis Date  . POLYCYSTIC OVARIAN DISEASE   . OBESITY   . SMOKER   . OBSTRUCTIVE SLEEP APNEA   . DELAYED GASTRIC EMPTYING   . DEPRESSION   . GERD   . Anxiety   . ASTHMA     Her Past Surgical History Is Significant For: Past Surgical History  Procedure Laterality Date  . Cocxyl removal  1995  . Dilation and curettage of uterus    . Tubal ligation  1998  . Cesarean section  97 & 98    x's 2  . Laparoscopy      Her Family History Is Significant For: Family History  Problem Relation Age of Onset   . Breast cancer Mother     dx 11/2009  . Diabetes Maternal Aunt   . Prostate cancer Maternal Grandfather 64    also hx colon polyps  . Heart disease Paternal Grandmother   . Pancreatic cancer Paternal Grandmother 56    Her Social History Is Significant For: History   Social History  . Marital Status: Married    Spouse Name: N/A    Number of Children: 2  . Years of Education: N/A   Social History Main Topics  . Smoking status: Current Some Day Smoker  . Smokeless tobacco: Never Used     Comment: Lives with husband & 2 kids from 33st marriage. (1st husband expired 1998-leukemia) work as Merchandiser, retail, but currently unemployed  . Alcohol Use: No     Comment: Rarely  . Drug Use: No  . Sexual Activity: None   Other Topics Concern  . None   Social History Narrative  . None    Her Allergies Are:  No Known Allergies:   Her Current Medications Are:  Outpatient Encounter Prescriptions as of 05/21/2013  Medication Sig  . albuterol (PROAIR HFA) 108 (90 BASE) MCG/ACT inhaler Inhale 2 puffs into the lungs every 4 (four) hours as needed.  . ALPRAZolam (XANAX) 0.5 MG tablet Take 1 tablet (0.5 mg total) by mouth every 8 (eight) hours as needed for sleep  or anxiety.  . calcium carbonate (TUMS - DOSED IN MG ELEMENTAL CALCIUM) 500 MG chewable tablet Chew 2 tablets by mouth daily.    . metFORMIN (GLUCOPHAGE-XR) 500 MG 24 hr tablet TAKE 1 TABLET (500 MG TOTAL) BY MOUTH DAILY WITH BREAKFAST.  [name] PARoxetine (PAXIL) 20 MG tablet Take 1 tablet (20 mg total) by mouth every morning.  . promethazine (PHENERGAN) 25 MG tablet 1 BY MOUTH EVERY 4 HOURS AS NEEDED FOR NAUSEA  . [DISCONTINUED] amoxicillin-clavulanate (AUGMENTIN) 875-125 MG per tablet Take 1 tablet by mouth 2 (two) times daily.  . [DISCONTINUED] esomeprazole (NEXIUM) 40 MG capsule Take 1 capsule (40 mg total) by mouth daily before breakfast.  . [DISCONTINUED] predniSONE (DELTASONE) 50 MG tablet 1 tab daily x  3 days   :   Review of Systems:  Out of a complete 14 point review of systems, all are reviewed and negative with the exception of these symptoms as listed below:   Review of Systems  Constitutional: Positive for fatigue and unexpected weight change.  Eyes: Positive for pain and visual disturbance.  Respiratory: Negative.   Cardiovascular: Negative.   Gastrointestinal: Negative.   Endocrine: Positive for heat intolerance.  Genitourinary: Negative.   Musculoskeletal: Positive for myalgias.  Skin: Negative.   Allergic/Immunologic: Negative.   Neurological: Positive for headaches.  Hematological: Negative.   Psychiatric/Behavioral: The patient is nervous/anxious.     Objective:  Neurologic Exam  Physical Exam Physical Examination:   Filed Vitals:   05/21/13 1328  BP: 144/88  Pulse: 93  Temp: 97.6 F (36.4 C)    General Examination: The patient is a very pleasant 43 y.o. female in no acute distress. She appears well-developed and well-nourished and adequately groomed. She is quite obese. She is anxious appearing.  HEENT: Normocephalic, atraumatic, pupils are equal, round and reactive to light and accommodation. Funduscopic exam is in keeping with probable papilledema on the left and possibly also on the right. Visual fields are full by finger perimetry.  Extraocular tracking is good without limitation to gaze excursion or nystagmus noted. Normal smooth pursuit is noted. Hearing is grossly intact. Tympanic membranes are clear bilaterally. Face is symmetric with normal facial animation and normal facial sensation. Speech is clear with no dysarthria noted. There is no hypophonia. There is no lip, neck/head, jaw or voice tremor. Neck is supple with full range of passive and active motion. There are no carotid bruits on auscultation. Oropharynx exam reveals: mild mouth dryness, adequate dental hygiene and moderate airway crowding, due to narrow airway entry, tonsillar size of 1+ bilaterally,  and larger tongue. Mallampati is class II. Tongue protrudes centrally and palate elevates symmetrically. Tonsils are 1+ in size. Neck size is 16 inches.   Chest: Clear to auscultation without wheezing, rhonchi or crackles noted.  Heart: S1+S2+0, regular and normal without murmurs, rubs or gallops noted.   Abdomen: Soft, non-tender and non-distended with normal bowel sounds appreciated on auscultation.  Extremities: There is no pitting edema in the distal lower extremities bilaterally. Pedal pulses are intact.  Skin: Warm and dry without trophic changes noted. There are no varicose veins.  Musculoskeletal: exam reveals no obvious joint deformities, tenderness or joint swelling or erythema.   Neurologically:  Mental status: The patient is awake, alert and oriented in all 4 spheres. Her immediate and remote memory, attention, language skills and fund of knowledge are appropriate. There is no evidence of aphasia, agnosia, apraxia or anomia. Speech is clear with normal prosody and enunciation. Thought process is linear.  Mood is normal and affect is anxious.  Cranial nerves II - XII are as described above under HEENT exam. In addition: shoulder shrug is normal with equal shoulder height noted. Motor exam: Normal bulk, strength and tone is noted. There is no drift, tremor or rebound. Romberg is negative. Reflexes are 2+ throughout. Babinski: Toes are flexor bilaterally. Fine motor skills and coordination: intact with normal finger taps, normal hand movements, normal rapid alternating patting, normal foot taps and normal foot agility.  Cerebellar testing: No dysmetria or intention tremor on finger to nose testing. Heel to shin is unremarkable bilaterally. There is no truncal or gait ataxia.  Sensory exam: intact to light touch, pinprick, vibration, temperature sense and proprioception in the upper and lower extremities.  Gait, station and balance: She stands easily. No veering to one side is noted. No  leaning to one side is noted. Posture is age-appropriate and stance is narrow based. Gait shows normal stride length and normal pace. No problems turning are noted. She turns en bloc. Tandem walk is unremarkable. Intact toe and heel stance is noted.               Assessment and Plan:   In summary, [name] is a very pleasant 43 y.o.-year old female with an underlying medical history of reflux disease, depression, anxiety, obesity, asthma, OSA, not on CPAP, smoking, and PCOS, who has had recurrent L sided headaches and blurry vision on the L for the past 2 months or so. She has on exam findings c/w with papilledema, L>R and there is concern for pseudotumor cerebri. She also has a history and physical exam also concerning for OSA, which may have become worse d/t weight gain. She is not currently on CPAP and may need to get back on CPAP. She has not had a sleep study in 2008. Otherwise, her exam is non-focal.  I had a long chat with the patient and her husband about my findings and the diagnosis of PTC as well as OSA, the prognosis and treatment options. We talked about medical treatments and non-pharmacological approaches. I have recommended that we proceed with a brain MRI without contrast, MRV head, lumbar puncture with pressure testing in routine evaluation of her CSF, and I would like for her to come back for sleep study. She would like to do this here. We talked about potential utilizing medication to keep her intracranial pressure lower. I may initiate treatment with Diamox down the [place]. She will need to see you back for a recheck on her eye exam as well. We talked about smoking cessation and maintaining a healthy lifestyle in general. I encouraged the patient to eat healthy, exercise daily and keep well hydrated, to keep a scheduled bedtime and wake time routine, to not skip any meals and eat healthy snacks in between meals and to have protein with every meal. She is strongly encouraged to pursue  weight loss.  I answered all their questions today and the patient and her husband were in agreement with the above outlined plan. I would like to see the patient back in 2 months, sooner if the need arises and encouraged her to call with any interim questions, concerns, problems or updates and test results. We will be calling her for most of this test results and we may also discuss with her starting Diamox in the near future.  Thank you very much for allowing me to participate in the care of this nice patient. If I can be  of any further assistance to you please do not hesitate to call me at [phone].  Sincerely,   Star Age, MD, PhD

## 2013-05-24 DIAGNOSIS — G932 Benign intracranial hypertension: Secondary | ICD-10-CM | POA: Insufficient documentation

## 2013-05-24 DIAGNOSIS — H4710 Unspecified papilledema: Secondary | ICD-10-CM | POA: Insufficient documentation

## 2013-05-26 ENCOUNTER — Other Ambulatory Visit: Payer: 59

## 2013-05-26 ENCOUNTER — Inpatient Hospital Stay: Admission: RE | Admit: 2013-05-26 | Payer: 59 | Source: Ambulatory Visit

## 2013-05-27 ENCOUNTER — Other Ambulatory Visit: Payer: 59

## 2013-06-02 ENCOUNTER — Ambulatory Visit
Admission: RE | Admit: 2013-06-02 | Discharge: 2013-06-02 | Disposition: A | Discharge status: Home / Self Care | Payer: 59 | Source: Ambulatory Visit | Attending: Neurology | Admitting: Neurology

## 2013-06-02 DIAGNOSIS — G932 Benign intracranial hypertension: Secondary | ICD-10-CM

## 2013-06-02 DIAGNOSIS — R51 Headache: Secondary | ICD-10-CM

## 2013-06-02 DIAGNOSIS — H4710 Unspecified papilledema: Secondary | ICD-10-CM

## 2013-06-02 DIAGNOSIS — R519 Headache, unspecified: Secondary | ICD-10-CM

## 2013-06-02 DIAGNOSIS — G4733 Obstructive sleep apnea (adult) (pediatric): Secondary | ICD-10-CM

## 2013-06-02 MED ORDER — GADOBENATE DIMEGLUMINE 529 MG/ML IV SOLN
20.0000 mL | Freq: Once | INTRAVENOUS | Status: AC | PRN
  Administered 2013-06-02: 20 mL via INTRAVENOUS

## 2013-06-04 ENCOUNTER — Telehealth: Payer: Self-pay | Admitting: Neurology

## 2013-06-04 NOTE — Telephone Encounter (Signed)
Patient calling to request MRI results. Please call patient and advise.

## 2013-06-04 NOTE — Telephone Encounter (Signed)
Called patient to inform her that her MRI results were normal and if she has any other problems, questions or concerns to call the office. Patient verbalized understanding.

## 2013-06-05 ENCOUNTER — Ambulatory Visit
Admission: RE | Admit: 2013-06-05 | Discharge: 2013-06-05 | Disposition: A | Discharge status: Home / Self Care | Payer: 59 | Source: Ambulatory Visit | Attending: Neurology | Admitting: Neurology

## 2013-06-05 VITALS — BP 124/51 | HR 88

## 2013-06-05 DIAGNOSIS — E669 Obesity, unspecified: Secondary | ICD-10-CM

## 2013-06-05 DIAGNOSIS — F17200 Nicotine dependence, unspecified, uncomplicated: Secondary | ICD-10-CM

## 2013-06-05 DIAGNOSIS — K219 Gastro-esophageal reflux disease without esophagitis: Secondary | ICD-10-CM

## 2013-06-05 DIAGNOSIS — R519 Headache, unspecified: Secondary | ICD-10-CM

## 2013-06-05 DIAGNOSIS — K3189 Other diseases of stomach and duodenum: Secondary | ICD-10-CM

## 2013-06-05 DIAGNOSIS — F329 Major depressive disorder, single episode, unspecified: Secondary | ICD-10-CM

## 2013-06-05 DIAGNOSIS — G4733 Obstructive sleep apnea (adult) (pediatric): Secondary | ICD-10-CM

## 2013-06-05 DIAGNOSIS — J45909 Unspecified asthma, uncomplicated: Secondary | ICD-10-CM

## 2013-06-05 DIAGNOSIS — F411 Generalized anxiety disorder: Secondary | ICD-10-CM

## 2013-06-05 DIAGNOSIS — R1013 Epigastric pain: Secondary | ICD-10-CM

## 2013-06-05 DIAGNOSIS — R51 Headache: Secondary | ICD-10-CM

## 2013-06-05 DIAGNOSIS — E282 Polycystic ovarian syndrome: Secondary | ICD-10-CM

## 2013-06-05 DIAGNOSIS — H4710 Unspecified papilledema: Secondary | ICD-10-CM

## 2013-06-05 DIAGNOSIS — F3289 Other specified depressive episodes: Secondary | ICD-10-CM

## 2013-06-05 DIAGNOSIS — G932 Benign intracranial hypertension: Secondary | ICD-10-CM

## 2013-06-05 LAB — CSF CELL COUNT WITH DIFFERENTIAL
RBC Count, CSF: 0 cu mm
RBC Count, CSF: 0 cu mm
Tube #: 1
Tube #: 4
WBC, CSF: 1 cu mm (ref 0–5)
WBC, CSF: 3 cu mm (ref 0–5)

## 2013-06-05 LAB — PROTEIN, CSF: Total Protein, CSF: 41 mg/dL (ref 15–45)

## 2013-06-05 LAB — GLUCOSE, CSF: Glucose, CSF: 56 mg/dL (ref 43–76)

## 2013-06-05 MED ORDER — DIAZEPAM 5 MG PO TABS
10.0000 mg | ORAL_TABLET | Freq: Once | ORAL | Status: AC
  Administered 2013-06-05: 10 mg via ORAL

## 2013-06-05 MED ORDER — ACETAZOLAMIDE ER 500 MG PO CP12: ORAL_CAPSULE | ORAL | Status: DC

## 2013-06-05 NOTE — Discharge Instructions (Signed)
Lumbar Puncture Discharge Instructions  1. Go home and rest quietly for the next 24 hours.  It is important to lie flat for the next 24 hours.  Get up only to go to the restroom.  You may lie in the bed or on a couch on your back, your stomach, your left side or your right side.  You may have one pillow under your head.  You may have pillows between your knees while you are on your side or under your knees while you are on your back.  2. DO NOT drive today.  Recline the seat as far back as it will go, while still wearing your seat belt, on the way home.  3. You may get up to go to the bathroom as needed.  You may sit up for 10 minutes to eat.  You may resume your normal diet and medications unless otherwise indicated.  Drink lots of extra fluids today and tomorrow.  4. The incidence of headache, nausea, or vomiting is about 5% (one in 20 patients).  If you develop a headache, lie flat and drink plenty of fluids until the headache goes away.  Caffeinated beverages may be helpful.  If you develop severe nausea and vomiting or a headache that does not go away with flat bed rest, call [phone].  5. You may resume normal activities after your 24 hours of bed rest is over; however, do not exert yourself strongly or do any heavy lifting tomorrow.  6. Call your physician for a follow-up appointment.   7. If you have any questions or if complications develop after you arrive home, please call [phone].  Discharge instructions have been explained to the patient.  The patient, or the person responsible for the patient, fully understands these instructions.

## 2013-06-05 NOTE — Progress Notes (Signed)
Patient resting quietly on stretcher in nursing station after LP.  Family at bedside.  Discharge instructions explained to family.

## 2013-06-05 NOTE — Progress Notes (Signed)
Two tiger-topped tubes of blood drawn for LP labs from right outer forearm; site unremarkable.  Discharge instructions explained to patient.  Medicated pre-procedure with Valium, per patient request.

## 2013-06-05 NOTE — Progress Notes (Signed)
Quick Note:  I called pt and let her know the spinal tap results, elev pressure at 25cm. Start diamox 532m po qhs x one week then 1 tab po bid. SE as per listed, and per pharmacy (which was called in). This as per discussed with pt when in the office. She is to call back if questions. ______

## 2013-06-05 NOTE — Progress Notes (Signed)
Quick Note:  Please advise patient that her recent spinal tap results indicate that she indeed has an elevated fluid pressure around her brain. Her spinal fluid results were benign but her opening pressure was indeed mildly elevated at 25 cm. We would like to see this below 20 cm of water. Therefore, as discussed during our clinic visit recently, I would like to initiate treatment with Diamox: (generic name: acetazolamide) 500 mg strength: Take one pill each night at bedtime for 1 week, then 1 pill twice daily thereafter. Common side effects reported are: Dizziness, lightheadedness, increase in urine output, blurry vision, dry mouth, drowsiness, loss of appetite, upset stomach, headache and tiredness, tingling in the hands and feet and change in taste, especially with carbonated sodas. Most side effects are transient especially during the first few days as the body adjusts to the medication.  Rx will be sent to pharmacy. thx Star Age, MD, PhD [facility] ([facility]    ______

## 2013-06-05 NOTE — Progress Notes (Signed)
Quick Note:  Please call and advise the patient that the recent scans we did were within normal limits. We did a brain MRI with and wo contrast and a brain MR venogram and both showed normal findings. In particular, there were no acute findings, such as a stroke, or mass or blood products or clots or abnormal fluid spaces and no abnormal contrast uptake. No further action is required on this test at this time. Please remind patient to keep any upcoming appointments or tests and to call us with any interim questions, concerns, problems or updates. Thanks,  Star Age, MD, PhD   ______

## 2013-06-06 ENCOUNTER — Telehealth: Payer: Self-pay | Admitting: Radiology

## 2013-06-06 NOTE — Telephone Encounter (Signed)
Pt had LP yesterday. After LP done pt states she had not been feeling well and her daughter had had a virus for the past 2 weeks. Pt called this morning and states she had been coughing, has a fever and a headache. Told her she needed to call Dr. Rexene Alberts and let them know of her symptoms. Told pt that bedrest and fluids would probably take care of her headache but that she needed to see someone about her other symptoms. Gave pt Dr. Tori Milks office number. Tried to leave message with Dr. Tori Milks office but it was closed and answering service is on.

## 2013-06-08 LAB — CSF CULTURE: Organism ID, Bacteria: NO GROWTH

## 2013-06-08 LAB — CSF CULTURE W GRAM STAIN
Gram Stain: NONE SEEN
Gram Stain: NONE SEEN

## 2013-06-09 DIAGNOSIS — E282 Polycystic ovarian syndrome: Secondary | ICD-10-CM | POA: Insufficient documentation

## 2013-06-10 LAB — HSV(HERPES SMPLX VRS)ABS-I+II(IGG)-CSF

## 2013-06-11 LAB — OLIGOCLONAL BANDS, CSF + SERM

## 2013-06-17 LAB — VIRAL CULTURE VIRC: Organism ID, Bacteria: NEGATIVE

## 2013-07-03 LAB — FUNGUS CULTURE W SMEAR: Smear Result: NONE SEEN

## 2013-07-04 ENCOUNTER — Ambulatory Visit: Payer: 59 | Admitting: Internal Medicine

## 2013-07-04 DIAGNOSIS — Z0289 Encounter for other administrative examinations: Secondary | ICD-10-CM

## 2013-10-07 ENCOUNTER — Ambulatory Visit: Payer: 59 | Admitting: Neurology

## 2013-10-22 ENCOUNTER — Encounter: Payer: Self-pay | Admitting: *Deleted

## 2013-10-22 ENCOUNTER — Encounter: Payer: Self-pay | Admitting: Cardiology

## 2013-10-22 ENCOUNTER — Ambulatory Visit (INDEPENDENT_AMBULATORY_CARE_PROVIDER_SITE_OTHER): Payer: 59 | Admitting: Cardiology

## 2013-10-22 VITALS — BP 130/84 | HR 86 | Ht 65.0 in | Wt 308.2 lb

## 2013-10-22 DIAGNOSIS — F17200 Nicotine dependence, unspecified, uncomplicated: Secondary | ICD-10-CM

## 2013-10-22 DIAGNOSIS — R0600 Dyspnea, unspecified: Secondary | ICD-10-CM

## 2013-10-22 DIAGNOSIS — R0609 Other forms of dyspnea: Secondary | ICD-10-CM

## 2013-10-22 DIAGNOSIS — R0989 Other specified symptoms and signs involving the circulatory and respiratory systems: Secondary | ICD-10-CM

## 2013-10-22 DIAGNOSIS — R079 Chest pain, unspecified: Secondary | ICD-10-CM

## 2013-10-22 NOTE — Assessment & Plan Note (Signed)
Patient counseled on discontinuing. 

## 2013-10-22 NOTE — Assessment & Plan Note (Signed)
Symptoms are atypical.Electrocardiogram normal. Plan exercise treadmill for risk stratification.

## 2013-10-22 NOTE — Assessment & Plan Note (Signed)
Echocardiogram to assess LV function.

## 2013-10-22 NOTE — Patient Instructions (Signed)
Your physician recommends that you schedule a follow-up appointment in: [place] physician has requested that you have an echocardiogram. Echocardiography is a painless test that uses sound waves to create images of your heart. It provides your doctor with information about the size and shape of your heart and how well your heart's chambers and valves are working. This procedure takes approximately one hour. There are no restrictions for this procedure.   Your physician has requested that you have an exercise tolerance test. For further information please visit [email or web address]. Please also follow instruction sheet, as given.    Exercise Stress Electrocardiogram An exercise stress electrocardiogram is a test to check how blood flows to your heart. It is done to find areas of poor blood flow. You will need to walk on a treadmill for this test. The electrocardiogram will record your heartbeat when you are at rest and when you are exercising. BEFORE THE PROCEDURE  Do not have drinks with caffeine or foods with caffeine for 24 hours before the test, or as told by your doctor.  Follow your doctor's instructions about eating and drinking before the test.  Ask your doctor what medicines you should or should not take before the test. Take your medicines with water unless told by your doctor not to.  If you use an inhaler, bring it with you to the test.  Bring a snack to eat after the test.  Do not  smoke for 4 hours before the test.  Wear comfortable shoes and clothing. PROCEDURE  You will have patches put on your chest. Small areas of your chest may need to be shaved. Wires will be connected to the patches.  Your heart rate will be watched while you are resting and while you are exercising.  You will walk on the treadmill. The treadmill will slowly get faster to raise your heart rate.  The test will take about 1-2 hours. AFTER THE PROCEDURE  Your heart rate and blood pressure  will be watched after the test.  You may return to your normal diet, activities, and medicines or as told by your doctor. Document Released: 09/14/2007 Document Revised: 01/16/2013 Document Reviewed: 12/03/2012 [facility] Patient Information 2015 [place], [place]. This information is not intended to replace advice given to you by your health care provider. Make sure you discuss any questions you have with your health care provider.

## 2013-10-22 NOTE — Progress Notes (Signed)
HPI:  43 yo female for evaluation of chest pain. Myoview performed in April 2008 that showed an ejection fraction of 66% with normal perfusion. A CardioNet monitor 1/10 showed sinus-to-sinus tachycardia with occasional PVCs and brief runs of paroxysmal atrial tachycardia. Echocardiogram on May 07, 2008. Her LV function was in low-normal range and estimated 50-55%. Patient states she has had intermittent chest pain for approximately 2 years. The pain is in the left breast area and described as a pin sensation. There is occasional tingling in her left upper extremity and radiation to her neck. There is diaphoresis and nausea and dyspnea. It lasts 1-2 minutes and resolves. Not exertional, pleuritic or positional. She notes dyspnea on exertion but no pedal edema.    Current Outpatient Prescriptions  Medication Sig Dispense Refill  . albuterol (PROAIR HFA) 108 (90 BASE) MCG/ACT inhaler Inhale 2 puffs into the lungs every 4 (four) hours as needed.  18 g  2  . ALPRAZolam (XANAX) 0.5 MG tablet Take 1 tablet (0.5 mg total) by mouth every 8 (eight) hours as needed for sleep or anxiety.  30 tablet  1  . calcium carbonate (TUMS - DOSED IN MG ELEMENTAL CALCIUM) 500 MG chewable tablet Chew 2 tablets by mouth daily.        . metFORMIN (GLUCOPHAGE-XR) 500 MG 24 hr tablet TAKE 1 TABLET (500 MG TOTAL) BY MOUTH DAILY WITH BREAKFAST.  30 tablet  11  . PARoxetine (PAXIL) 20 MG tablet Take 1 tablet (20 mg total) by mouth every morning.  30 tablet  11  . promethazine (PHENERGAN) 25 MG tablet 1 BY MOUTH EVERY 4 HOURS AS NEEDED FOR NAUSEA  30 tablet  0  . acetaZOLAMIDE (DIAMOX) 500 MG capsule 1 pill nightly at bedtime for 1 week, then 1 pill twice daily thereafter.  60 capsule  5   No current facility-administered medications for this visit.    No Known Allergies  Past Medical History  Diagnosis Date  . POLYCYSTIC OVARIAN DISEASE   . OBESITY   . SMOKER   . OBSTRUCTIVE SLEEP APNEA   . DELAYED GASTRIC  EMPTYING   . DEPRESSION   . GERD   . Anxiety   . ASTHMA     Past Surgical History  Procedure Laterality Date  . Cocxyl removal  1995  . Dilation and curettage of uterus    . Tubal ligation  1998  . Cesarean section  97 & 98    x's 2  . Laparoscopy      History   Social History  . Marital Status: Married    Spouse Name: N/A    Number of Children: 2  . 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 Smoking status: Current Some Day Smoker -- 6.00 packs/day    Types: Cigarettes    Start date: 10/23/1987  . Smokeless tobacco: Never Used     Comment: Lives with husband & 2 kids from 34st marriage. (1st husband expired 1998-leukemia) work as Merchandiser, retail, but currently unemployed  . Alcohol Use: Yes     Comment: Rarely  . Drug Use: No  . Sexual Activity: Not on file   Other Topics Concern  . Not on file   Social History Narrative  . No narrative on file    Family History  Problem Relation Age of Onset  . Breast cancer Mother  dx 11/2009  . Diabetes Maternal Aunt   . Prostate cancer Maternal Grandfather 51    also hx colon polyps  . Heart disease Paternal Grandmother   . Pancreatic cancer Paternal Grandmother 58    ROS: no fevers or chills, productive cough, hemoptysis, dysphasia, odynophagia, melena, hematochezia, dysuria, hematuria, rash, seizure activity, orthopnea, PND, pedal edema, claudication. Remaining systems are negative.  Physical Exam:   Blood pressure 130/84, pulse 86, height 5' 5"  (1.651 m), weight 308 lb 3.2 oz (139.799 kg).  General:  Well developed/well obese/anxious in NAD Skin warm/dry Patient not depressed No peripheral clubbing Back-normal HEENT-normal/normal eyelids Neck supple/normal carotid upstroke bilaterally; no bruits; no JVD; no thyromegaly chest - CTA/ normal expansion CV - RRR/normal S1 and S2; no murmurs, rubs or gallops;  PMI nondisplaced Abdomen -NT/ND, no HSM, no  mass, + bowel sounds, no bruit 2+ femoral pulses, no bruits Ext-no edema, chords, 2+ DP Neuro-grossly nonfocal  ECG Sinus rhythm at a rate of 86. No ST changes.

## 2013-11-05 ENCOUNTER — Telehealth (HOSPITAL_COMMUNITY): Payer: Self-pay

## 2013-11-05 NOTE — Telephone Encounter (Signed)
Encounter complete. 

## 2013-11-06 ENCOUNTER — Telehealth (HOSPITAL_COMMUNITY): Payer: Self-pay

## 2013-11-06 NOTE — Telephone Encounter (Signed)
Encounter complete. 

## 2013-11-07 ENCOUNTER — Ambulatory Visit (HOSPITAL_BASED_OUTPATIENT_CLINIC_OR_DEPARTMENT_OTHER)
Admission: RE | Admit: 2013-11-07 | Discharge: 2013-11-07 | Disposition: A | Discharge status: Home / Self Care | Payer: 59 | Source: Ambulatory Visit | Attending: Cardiology | Admitting: Cardiology

## 2013-11-07 ENCOUNTER — Ambulatory Visit (HOSPITAL_COMMUNITY)
Admission: RE | Admit: 2013-11-07 | Discharge: 2013-11-07 | Disposition: A | Discharge status: Home / Self Care | Payer: 59 | Source: Ambulatory Visit | Attending: Cardiology | Admitting: Cardiology

## 2013-11-07 DIAGNOSIS — R0609 Other forms of dyspnea: Secondary | ICD-10-CM | POA: Diagnosis not present

## 2013-11-07 DIAGNOSIS — R079 Chest pain, unspecified: Secondary | ICD-10-CM

## 2013-11-07 DIAGNOSIS — I517 Cardiomegaly: Secondary | ICD-10-CM | POA: Diagnosis not present

## 2013-11-07 DIAGNOSIS — R0989 Other specified symptoms and signs involving the circulatory and respiratory systems: Secondary | ICD-10-CM

## 2013-11-07 DIAGNOSIS — R0600 Dyspnea, unspecified: Secondary | ICD-10-CM

## 2013-11-07 NOTE — Progress Notes (Signed)
2D Echocardiogram Complete.  11/07/2013   Joury Allcorn, RDCS

## 2013-11-07 NOTE — Procedures (Signed)
Exercise Treadmill Test   Test  Exercise Tolerance Test Ordering MD: Kirk Ruths, MD    Unique Test No: 1   Treadmill:  1  Indication for ETT: chest pain - rule out ischemia  Contraindication to ETT: No   Stress Modality: exercise - treadmill  Cardiac Imaging Performed: non   Protocol: standard Bruce - maximal  Max BP:  144/114  Max MPHR (bpm):  179 85% MPR (bpm):  152  MPHR obtained (bpm):  150 % MPHR obtained:  83  Reached 85% MPHR (min:sec):  0 Total Exercise Time (min-sec):  4  Workload in METS:  5.8 Borg Scale: 16  Reason ETT Terminated:  dyspnea    ST Segment Analysis At Rest: normal ST segments - no evidence of significant ST depression With Exercise: no evidence of significant ST depression  Other Information Arrhythmia:  No Angina during ETT:  absent (0) Quality of ETT:  diagnostic  ETT Interpretation:  normal - no evidence of ischemia by ST analysis  Comments: THR achieved Poor exercise tolerance for age Dyspnea, but no chest pain noted during the study Delayed HR and BP recovery Duke Treadmill Score: +4  Pixie Casino, MD, [facility] Attending Cardiologist [place]

## 2013-11-08 ENCOUNTER — Encounter: Payer: Self-pay | Admitting: Cardiology

## 2013-11-08 NOTE — Telephone Encounter (Signed)
This encounter was created in error - please disregard.

## 2013-11-08 NOTE — Telephone Encounter (Signed)
Returning a call from [place] regarding her test results.

## 2013-11-14 NOTE — Telephone Encounter (Signed)
Noted  

## 2013-11-17 ENCOUNTER — Other Ambulatory Visit: Payer: Self-pay | Admitting: Internal Medicine

## 2013-11-22 ENCOUNTER — Other Ambulatory Visit: Payer: Self-pay | Admitting: Internal Medicine

## 2013-11-26 ENCOUNTER — Ambulatory Visit: Payer: 59 | Admitting: Internal Medicine

## 2014-01-02 ENCOUNTER — Encounter: Payer: Self-pay | Admitting: Internal Medicine

## 2014-01-02 ENCOUNTER — Ambulatory Visit (INDEPENDENT_AMBULATORY_CARE_PROVIDER_SITE_OTHER): Payer: 59 | Admitting: Internal Medicine

## 2014-01-02 VITALS — BP 122/88 | HR 77 | Temp 98.2°F | Ht 65.0 in | Wt 311.8 lb

## 2014-01-02 DIAGNOSIS — J069 Acute upper respiratory infection, unspecified: Secondary | ICD-10-CM

## 2014-01-02 DIAGNOSIS — H4710 Unspecified papilledema: Secondary | ICD-10-CM

## 2014-01-02 DIAGNOSIS — G932 Benign intracranial hypertension: Secondary | ICD-10-CM

## 2014-01-02 DIAGNOSIS — J45901 Unspecified asthma with (acute) exacerbation: Secondary | ICD-10-CM | POA: Diagnosis not present

## 2014-01-02 DIAGNOSIS — J4521 Mild intermittent asthma with (acute) exacerbation: Secondary | ICD-10-CM

## 2014-01-02 MED ORDER — ALBUTEROL SULFATE HFA 108 (90 BASE) MCG/ACT IN AERS: 2.0000 | INHALATION_SPRAY | RESPIRATORY_TRACT | Status: DC | PRN

## 2014-01-02 MED ORDER — PREDNISONE (PAK) 10 MG PO TABS: ORAL_TABLET | ORAL | Status: DC

## 2014-01-02 MED ORDER — PROMETHAZINE-CODEINE 6.25-10 MG/5ML PO SYRP: 5.0000 mL | ORAL_SOLUTION | Freq: Every evening | ORAL | Status: DC | PRN

## 2014-01-02 NOTE — Assessment & Plan Note (Addendum)
Presumed dx early 05/2013 due to abnormal L optomitrist exam: papilledema?  eye findings prompted referral to neuro - eval by [facility] reviewed MRI brain done - WNL -  ?mild elevation opening pressure on LP - unable to find report Because of same, Neuro recommended diamox which pt has elected NOT to start due to feared side effects In past 6 mo, patient feels improved: less headache, less blurring - ?now if still needs to take meds for dx Will refer back to neuro for 2nd opinion and arrange formal eye exam with optho

## 2014-01-02 NOTE — Progress Notes (Signed)
Pre visit review using our clinic review tool, if applicable. No additional management support is needed unless otherwise documented below in the visit note. 

## 2014-01-02 NOTE — Assessment & Plan Note (Signed)
Reported dx same on L by optometry exam 05/2013 ([facility] walk in @ [place]) No optho exam since, blurring has improved with use of corrective lenses Refer to [place] [facility] for 2nd opinion and mgmt as needed

## 2014-01-02 NOTE — Patient Instructions (Signed)
It was good to see you today.  We have reviewed your prior records including labs and tests today   Medications reviewed and updated Refill Albuterol inhaler, take prednisone taper for 6 days and use cough syrup at night as needed Your prescription(s) have been submitted to your pharmacy. Please take as directed and contact our office if you believe you are having problem(s) with the medication(s).  If you develop worsening symptoms or fever, call and we can reconsider antibiotics, but it does not appear necessary to use antibiotics at this time.  we'll make referral to ophthalmologist and to new neurologist for a second opinion . Our office will contact you regarding appointment(s) once made.  Please keep followup for annual exam as scheduled, call sooner if problems.

## 2014-01-02 NOTE — Progress Notes (Signed)
Subjective:    Patient ID: [name], female    DOB: 12-Feb-1971, 43 y.o.   MRN: [id number]  HPI  Patient is here for follow up  Reviewed chronic medical issues and interval medical events  Past Medical History  Diagnosis Date  . POLYCYSTIC OVARIAN DISEASE   . OBESITY   . SMOKER   . OBSTRUCTIVE SLEEP APNEA   . DELAYED GASTRIC EMPTYING   . DEPRESSION   . GERD   . Anxiety   . ASTHMA     Review of Systems  Constitutional: Positive for diaphoresis and unexpected weight change (gain). Negative for fever.  HENT: Positive for rhinorrhea and sore throat. Negative for ear pain, postnasal drip, sinus pressure, tinnitus, trouble swallowing and voice change.   Respiratory: Positive for cough (x 3 days) and wheezing (with exertion and lying supine at night, x 3 d). Negative for shortness of breath.   Cardiovascular: Negative for chest pain and leg swelling.       Objective:   Physical Exam  BP 122/88  Pulse 77  Temp(Src) 98.2 F (36.8 C) (Oral)  Ht 5' 5"  (1.651 m)  Wt 311 lb 12 oz (141.409 kg)  BMI 51.88 kg/m2  SpO2 97%  LMP 10/16/2013 Wt Readings from Last 3 Encounters:  01/02/14 311 lb 12 oz (141.409 kg)  10/22/13 308 lb 3.2 oz (139.799 kg)  05/21/13 302 lb (136.986 kg)    Constitutional: She is obese, appears well-developed and well-nourished. No distress. spouse at side HENT: NCAT, PERRL, EOMI, OP with mild erythema and clear PND, no exudate - TMs clear B without erythema or effusion Neck: Thick. Normal range of motion. Neck supple. No JVD present. No thyromegaly present.  Cardiovascular: Normal rate, regular rhythm and normal heart sounds.  No murmur heard. No BLE edema. Pulmonary/Chest: Effort normal and breath sounds diminished at base. No respiratory distress. She has no wheezes at present.  Psychiatric: She has an anxious mood and affect. Her behavior is normal. Judgment and thought content normal.   Lab Results  Component Value Date   WBC 11.9* 08/16/2012   HGB 12.9 08/16/2012   HCT 38.5 08/16/2012   PLT 319.0 08/16/2012   GLUCOSE 83 08/16/2012   CHOL 207* 08/16/2012   TRIG 125.0 08/16/2012   HDL 34.20* 08/16/2012   LDLDIRECT 154.1 08/16/2012   LDLCALC 132* 08/11/2011   ALT 13 12/12/2012   AST 8 12/12/2012   NA 138 08/16/2012   K 3.9 08/16/2012   CL 104 08/16/2012   CREATININE 1.0 08/16/2012   BUN 12 08/16/2012   CO2 26 08/16/2012   TSH 3.67 08/16/2012   HGBA1C 5.8 12/12/2012    No results found.     Assessment & Plan:    URI - explained lack of efficacy of antibiotics in viral disease - symptomatic care recommended: antitussive and nasal decongestants/steroids for sinusitis; hydration and rest with tylenol or ibuprofen as needed for headache and myalgia symptoms  - pt to call if symptoms worse or unimproved   Pred taper and refill Alb MDI for acute asthma exac related to same   Problem List Items Addressed This Visit   Papilledema     Reported dx same on L by optometry exam 05/2013 ([facility] walk in @ [place]) No optho exam since, blurring has improved with use of corrective lenses Refer to [place] op[facility]o for 2nd opinion and mgmt as needed    Relevant Orders      Ambulatory referral to Ophthalmology   Pseudotumor cerebri -  Primary     Presumed dx early 05/2013 due to abnormal L optomitrist exam: papilledema?  eye findings prompted referral to neuro - eval by [facility] reviewed MRI brain done - WNL -  ?mild elevation opening pressure on LP - unable to find report Because of same, Neuro recommended diamox which pt has elected NOT to start due to feared side effects In past 6 mo, patient feels improved: less headache, less blurring - ?now if still needs to take meds for dx Will refer back to neuro for 2nd opinion and arrange formal eye exam with optho    Relevant Orders      Ambulatory referral to Neurology    Other Visit Diagnoses   Acute upper respiratory infections of unspecified site        Asthma with acute exacerbation, mild intermittent        Relevant  Medications       predniSONE (STERAPRED UNI-PAK) 10 MG tablet       albuterol (PROAIR HFA) 108 (90 BASE) MCG/ACT inhaler

## 2014-01-03 ENCOUNTER — Telehealth: Payer: Self-pay | Admitting: Internal Medicine

## 2014-01-03 NOTE — Telephone Encounter (Signed)
Patient states Dr. Asa Lente told her if she was not feeling better today to call back and she would call in a zpak to patients pharmacy at [facility] in [place].   Patient would like sent over.

## 2014-01-04 MED ORDER — AZITHROMYCIN 250 MG PO TABS: ORAL_TABLET | ORAL | Status: DC

## 2014-01-04 NOTE — Telephone Encounter (Signed)
done

## 2014-01-09 ENCOUNTER — Ambulatory Visit: Payer: 59 | Admitting: Neurology

## 2014-01-10 ENCOUNTER — Encounter: Payer: Self-pay | Admitting: *Deleted

## 2014-01-10 ENCOUNTER — Telehealth: Payer: Self-pay | Admitting: Neurology

## 2014-01-10 NOTE — Telephone Encounter (Signed)
Pt no showed 01/09/14 NP appt w/ Dr. Carles Collet. Referring office has been notified via EPIC referral message.   Danae Chen - please send patient a no show letter / Gayleen [place].

## 2014-01-10 NOTE — Progress Notes (Signed)
No show letter sent for 01/09/2014

## 2014-01-16 ENCOUNTER — Encounter: Payer: Self-pay | Admitting: Internal Medicine

## 2014-03-02 ENCOUNTER — Other Ambulatory Visit: Payer: Self-pay | Admitting: Internal Medicine

## 2014-04-02 ENCOUNTER — Encounter: Payer: 59 | Admitting: Internal Medicine

## 2014-04-03 ENCOUNTER — Encounter: Payer: 59 | Admitting: Internal Medicine

## 2014-06-08 ENCOUNTER — Other Ambulatory Visit: Payer: Self-pay | Admitting: Internal Medicine

## 2014-07-02 ENCOUNTER — Encounter: Payer: 59 | Admitting: Internal Medicine

## 2014-07-09 ENCOUNTER — Encounter: Payer: Self-pay | Admitting: Internal Medicine

## 2014-10-22 ENCOUNTER — Encounter: Payer: Self-pay | Admitting: Internal Medicine

## 2014-10-22 DIAGNOSIS — Z0289 Encounter for other administrative examinations: Secondary | ICD-10-CM

## 2014-12-12 ENCOUNTER — Other Ambulatory Visit: Payer: Self-pay | Admitting: Internal Medicine

## 2015-02-10 ENCOUNTER — Other Ambulatory Visit: Payer: Self-pay | Admitting: Internal Medicine

## 2015-02-12 ENCOUNTER — Other Ambulatory Visit: Payer: Self-pay | Admitting: Obstetrics and Gynecology

## 2015-02-13 LAB — CYTOLOGY - PAP

## 2015-02-14 ENCOUNTER — Other Ambulatory Visit: Payer: Self-pay | Admitting: Internal Medicine

## 2015-02-16 ENCOUNTER — Telehealth: Payer: Self-pay | Admitting: Internal Medicine

## 2015-02-16 ENCOUNTER — Other Ambulatory Visit: Payer: Self-pay

## 2015-02-16 MED ORDER — PAROXETINE HCL 20 MG PO TABS: 20.0000 mg | ORAL_TABLET | Freq: Every day | ORAL | Status: DC

## 2015-02-16 NOTE — Addendum Note (Signed)
Addended by: Della Goo C on: 02/16/2015 08:57 AM   Modules accepted: Orders

## 2015-02-16 NOTE — Telephone Encounter (Signed)
Medication has been sent to the pharmacy. Pt will need to keep appt for any additional refills.

## 2015-02-16 NOTE — Telephone Encounter (Signed)
Pt called in to check on the status of her PARoxetine (PAXIL) 20 MG tablet [[id number]], she would like to know if she could get that today?

## 2015-02-18 ENCOUNTER — Ambulatory Visit (HOSPITAL_COMMUNITY)
Admission: RE | Admit: 2015-02-18 | Discharge: 2015-02-18 | Disposition: A | Discharge status: Home / Self Care | Payer: 59 | Source: Ambulatory Visit | Attending: Obstetrics and Gynecology | Admitting: Obstetrics and Gynecology

## 2015-02-18 DIAGNOSIS — R079 Chest pain, unspecified: Secondary | ICD-10-CM | POA: Insufficient documentation

## 2015-02-18 LAB — HEMOGLOBIN A1C: Hemoglobin A1C: 5.82

## 2015-02-18 LAB — BASIC METABOLIC PANEL
BUN: 10 mg/dL (ref 4–21)
Creatinine: 0.8 mg/dL (ref 0.5–1.1)
Glucose: 78 mg/dL
Potassium: 4.3 mmol/L (ref 3.4–5.3)
Sodium: 138 mmol/L (ref 137–147)

## 2015-02-18 LAB — CBC AND DIFFERENTIAL
Hemoglobin: 12.4 g/dL (ref 12.0–16.0)
Platelets: 363 10*3/uL (ref 150–399)
WBC: 9 10^3/mL

## 2015-02-18 LAB — HEPATIC FUNCTION PANEL
ALT: 10 U/L (ref 7–35)
AST: 7 U/L — AB (ref 13–35)
Alkaline Phosphatase: 89 U/L (ref 25–125)
Bilirubin, Total: 0.5 mg/dL

## 2015-02-18 LAB — LIPID PANEL
Cholesterol: 208 mg/dL — AB (ref 0–200)
HDL: 37 mg/dL (ref 35–70)
LDL Cholesterol: 148 mg/dL
Triglycerides: 117 mg/dL (ref 40–160)

## 2015-02-18 LAB — HM PAP SMEAR

## 2015-02-18 LAB — TSH: TSH: 2.3 u[IU]/mL (ref 0.41–5.90)

## 2015-04-09 ENCOUNTER — Other Ambulatory Visit (INDEPENDENT_AMBULATORY_CARE_PROVIDER_SITE_OTHER): Payer: 59

## 2015-04-09 ENCOUNTER — Encounter: Payer: Self-pay | Admitting: Internal Medicine

## 2015-04-09 ENCOUNTER — Ambulatory Visit (INDEPENDENT_AMBULATORY_CARE_PROVIDER_SITE_OTHER): Payer: 59 | Admitting: Internal Medicine

## 2015-04-09 VITALS — BP 128/78 | HR 91 | Temp 98.3°F | Resp 20 | Ht 65.0 in | Wt 324.0 lb

## 2015-04-09 DIAGNOSIS — F329 Major depressive disorder, single episode, unspecified: Secondary | ICD-10-CM | POA: Diagnosis not present

## 2015-04-09 DIAGNOSIS — R5383 Other fatigue: Secondary | ICD-10-CM

## 2015-04-09 DIAGNOSIS — Z0000 Encounter for general adult medical examination without abnormal findings: Secondary | ICD-10-CM | POA: Diagnosis not present

## 2015-04-09 DIAGNOSIS — E538 Deficiency of other specified B group vitamins: Secondary | ICD-10-CM | POA: Diagnosis not present

## 2015-04-09 DIAGNOSIS — E559 Vitamin D deficiency, unspecified: Secondary | ICD-10-CM

## 2015-04-09 DIAGNOSIS — F17200 Nicotine dependence, unspecified, uncomplicated: Secondary | ICD-10-CM

## 2015-04-09 DIAGNOSIS — F32A Depression, unspecified: Secondary | ICD-10-CM

## 2015-04-09 DIAGNOSIS — R7302 Impaired glucose tolerance (oral): Secondary | ICD-10-CM

## 2015-04-09 HISTORY — DX: Impaired glucose tolerance (oral): R73.02

## 2015-04-09 LAB — VITAMIN D 25 HYDROXY (VIT D DEFICIENCY, FRACTURES): VITD: 18.94 ng/mL — ABNORMAL LOW (ref 30.00–100.00)

## 2015-04-09 LAB — VITAMIN B12: Vitamin B-12: 254 pg/mL (ref 211–911)

## 2015-04-09 MED ORDER — ALPRAZOLAM 0.5 MG PO TABS: 0.5000 mg | ORAL_TABLET | Freq: Three times a day (TID) | ORAL | Status: DC | PRN

## 2015-04-09 MED ORDER — ERGOCALCIFEROL 1.25 MG (50000 UT) PO CAPS: 50000.0000 [IU] | ORAL_CAPSULE | ORAL | Status: DC

## 2015-04-09 MED ORDER — PHENTERMINE HCL 37.5 MG PO CAPS: 37.5000 mg | ORAL_CAPSULE | ORAL | Status: DC

## 2015-04-09 NOTE — Progress Notes (Signed)
Pre visit review using our clinic review tool, if applicable. No additional management support is needed unless otherwise documented below in the visit note. 

## 2015-04-09 NOTE — Assessment & Plan Note (Signed)
On low dose Paxil since 2011 - intermittent compliance until 2012 Also uses prn xanax for anxiety and panic attacks - last fill xanax 2014, ok to refill today (03/2015) symptoms stable on current meds - The current medical regimen is effective;  continue present plan and medications.

## 2015-04-09 NOTE — Assessment & Plan Note (Signed)
Metabolic syndrome associated with PCOS -long hx ammenorrhea but resumed cycle briefly after starting metformin DC'd metformin summer 2015 at direction of gyn as rx no longer needed for PCOS management Periodically check a1c to monitor insulin resistance in setting of continued weight gain -   Reviewed importance of regular exercise, low carb diet and weight reduction in management of this condition  Lab Results  Component Value Date   HGBA1C 5.82 02/18/2015

## 2015-04-09 NOTE — Patient Instructions (Addendum)
It was good to see you today.  We have reviewed your prior records including labs and tests today  Health Maintenance reviewed - all recommended immunizations and age-appropriate screenings are up-to-date.  Test(s) ordered today. Your results will be released to [place] (or called to you) after review, usually within 72hours after test completion. If any changes need to be made, you will be notified at that same time.  Medications reviewed and updated Ok for phentermine to help you reach your weight loss goals - today prescription for 1st of up to 3-6 months provided - If side effects or other problems, please stop medication and call us.  Please schedule followup in 4-6 weeks for visit with Dr. Quay Burow recheck weight and review, call sooner if problems.  Health Maintenance, Female Adopting a healthy lifestyle and getting preventive care can go a long way to promote health and wellness. Talk with your health care provider about what schedule of regular examinations is right for you. This is a good chance for you to check in with your provider about disease prevention and staying healthy. In between checkups, there are plenty of things you can do on your own. Experts have done a lot of research about which lifestyle changes and preventive measures are most likely to keep you healthy. Ask your health care provider for more information. WEIGHT AND DIET  Eat a healthy diet  Be sure to include plenty of vegetables, fruits, low-fat dairy products, and lean protein.  Do not eat a lot of foods high in solid fats, added sugars, or salt.  Get regular exercise. This is one of the most important things you can do for your health.  Most adults should exercise for at least 150 minutes each week. The exercise should increase your heart rate and make you sweat (moderate-intensity exercise).  Most adults should also do strengthening exercises at least twice a week. This is in addition to the  moderate-intensity exercise.  Maintain a healthy weight  Body mass index (BMI) is a measurement that can be used to identify possible weight problems. It estimates body fat based on height and weight. Your health care provider can help determine your BMI and help you achieve or maintain a healthy weight.  For females 85 years of age and older:   A BMI below 18.5 is considered underweight.  A BMI of 18.5 to 24.9 is normal.  A BMI of 25 to 29.9 is considered overweight.  A BMI of 30 and above is considered obese.  Watch levels of cholesterol and blood lipids  You should start having your blood tested for lipids and cholesterol at 43 years of age, then have this test every 5 years.  You may need to have your cholesterol levels checked more often if:  Your lipid or cholesterol levels are high.  You are older than 43 years of age.  You are at high risk for heart disease.  CANCER SCREENING   Lung Cancer  Lung cancer screening is recommended for adults 7-44 years old who are at high risk for lung cancer because of a history of smoking.  A yearly low-dose CT scan of the lungs is recommended for people who:  Currently smoke.  Have quit within the past 15 years.  Have at least a 30-pack-year history of smoking. A pack year is smoking an average of one pack of cigarettes a day for 1 year.  Yearly screening should continue until it has been 15 years since you quit.  Yearly  screening should stop if you develop a health problem that would prevent you from having lung cancer treatment.  Breast Cancer  Practice breast self-awareness. This means understanding how your breasts normally appear and feel.  It also means doing regular breast self-exams. Let your health care provider know about any changes, no matter how small.  If you are in your 20s or 30s, you should have a clinical breast exam (CBE) by a health care provider every 1-3 years as part of a regular health exam.  If  you are 42 or older, have a CBE every year. Also consider having a breast X-ray (mammogram) every year.  If you have a family history of breast cancer, talk to your health care provider about genetic screening.  If you are at high risk for breast cancer, talk to your health care provider about having an MRI and a mammogram every year.  Breast cancer gene (BRCA) assessment is recommended for women who have family members with BRCA-related cancers. BRCA-related cancers include:  Breast.  Ovarian.  Tubal.  Peritoneal cancers.  Results of the assessment will determine the need for genetic counseling and BRCA1 and BRCA2 testing. Cervical Cancer Your health care provider may recommend that you be screened regularly for cancer of the pelvic organs (ovaries, uterus, and vagina). This screening involves a pelvic examination, including checking for microscopic changes to the surface of your cervix (Pap test). You may be encouraged to have this screening done every 3 years, beginning at age 62.  For women ages 80-65, health care providers may recommend pelvic exams and Pap testing every 3 years, or they may recommend the Pap and pelvic exam, combined with testing for human papilloma virus (HPV), every 5 years. Some types of HPV increase your risk of cervical cancer. Testing for HPV may also be done on women of any age with unclear Pap test results.  Other health care providers may not recommend any screening for nonpregnant women who are considered low risk for pelvic cancer and who do not have symptoms. Ask your health care provider if a screening pelvic exam is right for you.  If you have had past treatment for cervical cancer or a condition that could lead to cancer, you need Pap tests and screening for cancer for at least 20 years after your treatment. If Pap tests have been discontinued, your risk factors (such as having a new sexual partner) need to be reassessed to determine if screening should  resume. Some women have medical problems that increase the chance of getting cervical cancer. In these cases, your health care provider may recommend more frequent screening and Pap tests. Colorectal Cancer  This type of cancer can be detected and often prevented.  Routine colorectal cancer screening usually begins at 43 years of age and continues through 43 years of age.  Your health care provider may recommend screening at an earlier age if you have risk factors for colon cancer.  Your health care provider may also recommend using home test kits to check for hidden blood in the stool.  A small camera at the end of a tube can be used to examine your colon directly (sigmoidoscopy or colonoscopy). This is done to check for the earliest forms of colorectal cancer.  Routine screening usually begins at age 86.  Direct examination of the colon should be repeated every 5-10 years through 43 years of age. However, you may need to be screened more often if early forms of precancerous polyps or small  growths are found. Skin Cancer  Check your skin from head to toe regularly.  Tell your health care provider about any new moles or changes in moles, especially if there is a change in a mole's shape or color.  Also tell your health care provider if you have a mole that is larger than the size of a pencil eraser.  Always use sunscreen. Apply sunscreen liberally and repeatedly throughout the day.  Protect yourself by wearing long sleeves, pants, a wide-brimmed hat, and sunglasses whenever you are outside. HEART DISEASE, DIABETES, AND HIGH BLOOD PRESSURE   High blood pressure causes heart disease and increases the risk of stroke. High blood pressure is more likely to develop in:  People who have blood pressure in the high end of the normal range (130-139/85-89 mm Hg).  People who are overweight or obese.  People who are African American.  If you are 45-41 years of age, have your blood pressure  checked every 3-5 years. If you are 55 years of age or older, have your blood pressure checked every year. You should have your blood pressure measured twice--once when you are at a hospital or clinic, and once when you are not at a hospital or clinic. Record the average of the two measurements. To check your blood pressure when you are not at a hospital or clinic, you can use:  An automated blood pressure machine at a pharmacy.  A home blood pressure monitor.  If you are between 18 years and 31 years old, ask your health care provider if you should take aspirin to prevent strokes.  Have regular diabetes screenings. This involves taking a blood sample to check your fasting blood sugar level.  If you are at a normal weight and have a low risk for diabetes, have this test once every three years after 43 years of age.  If you are overweight and have a high risk for diabetes, consider being tested at a younger age or more often. PREVENTING INFECTION  Hepatitis B  If you have a higher risk for hepatitis B, you should be screened for this virus. You are considered at high risk for hepatitis B if:  You were born in a country where hepatitis B is common. Ask your health care provider which countries are considered high risk.  Your parents were born in a high-risk country, and you have not been immunized against hepatitis B (hepatitis B vaccine).  You have HIV or AIDS.  You use needles to inject street drugs.  You live with someone who has hepatitis B.  You have had sex with someone who has hepatitis B.  You get hemodialysis treatment.  You take certain medicines for conditions, including cancer, organ transplantation, and autoimmune conditions. Hepatitis C  Blood testing is recommended for:  Everyone born from [birth year] through 1964.  Anyone with known risk factors for hepatitis C. Sexually transmitted infections (STIs)  You should be screened for sexually transmitted infections (STIs)  including gonorrhea and chlamydia if:  You are sexually active and are younger than 43 years of age.  You are older than 43 years of age and your health care provider tells you that you are at risk for this type of infection.  Your sexual activity has changed since you were last screened and you are at an increased risk for chlamydia or gonorrhea. Ask your health care provider if you are at risk.  If you do not have HIV, but are at risk, it may be recommended  that you take a prescription medicine daily to prevent HIV infection. This is called pre-exposure prophylaxis (PrEP). You are considered at risk if:  You are sexually active and do not regularly use condoms or know the HIV status of your partner(s).  You take drugs by injection.  You are sexually active with a partner who has HIV. Talk with your health care provider about whether you are at high risk of being infected with HIV. If you choose to begin PrEP, you should first be tested for HIV. You should then be tested every 3 months for as long as you are taking PrEP.  PREGNANCY   If you are premenopausal and you may become pregnant, ask your health care provider about preconception counseling.  If you may become pregnant, take 400 to 800 micrograms (mcg) of folic acid every day.  If you want to prevent pregnancy, talk to your health care provider about birth control (contraception). OSTEOPOROSIS AND MENOPAUSE   Osteoporosis is a disease in which the bones lose minerals and strength with aging. This can result in serious bone fractures. Your risk for osteoporosis can be identified using a bone density scan.  If you are 68 years of age or older, or if you are at risk for osteoporosis and fractures, ask your health care provider if you should be screened.  Ask your health care provider whether you should take a calcium or vitamin D supplement to lower your risk for osteoporosis.  Menopause may have certain physical symptoms and  risks.  Hormone replacement therapy may reduce some of these symptoms and risks. Talk to your health care provider about whether hormone replacement therapy is right for you.  HOME CARE INSTRUCTIONS   Schedule regular health, dental, and eye exams.  Stay current with your immunizations.   Do not use any tobacco products including cigarettes, chewing tobacco, or electronic cigarettes.  If you are pregnant, do not drink alcohol.  If you are breastfeeding, limit how much and how often you drink alcohol.  Limit alcohol intake to no more than 1 drink per day for nonpregnant women. One drink equals 12 ounces of beer, 5 ounces of wine, or 1 ounces of hard liquor.  Do not use street drugs.  Do not share needles.  Ask your health care provider for help if you need support or information about quitting drugs.  Tell your health care provider if you often feel depressed.  Tell your health care provider if you have ever been abused or do not feel safe at home.   This information is not intended to replace advice given to you by your health care provider. Make sure you discuss any questions you have with your health care provider.   Document Released: 10/11/2010 Document Revised: 04/18/2014 Document Reviewed: 02/27/2013 Elsevier Interactive Patient Education 2016 [employer]. Exercising to [employer] Exercising can help you to lose weight. In order to lose weight through exercise, you need to do vigorous-intensity exercise. You can tell that you are exercising with vigorous intensity if you are breathing very hard and fast and cannot hold a conversation while exercising. Moderate-intensity exercise helps to maintain your current weight. You can tell that you are exercising at a moderate level if you have a higher heart rate and faster breathing, but you are still able to hold a conversation. HOW OFTEN SHOULD I EXERCISE? Choose an activity that you enjoy and set realistic goals. Your health  care provider can help you to make an activity plan that  works for you. Exercise regularly as directed by your health care provider. This may include:  Doing resistance training twice each week, such as:  Push-ups.  Sit-ups.  Lifting weights.  Using resistance bands.  Doing a given intensity of exercise for a given amount of time. Choose from these options:  150 minutes of moderate-intensity exercise every week.  75 minutes of vigorous-intensity exercise every week.  A mix of moderate-intensity and vigorous-intensity exercise every week. Children, pregnant women, people who are out of shape, people who are overweight, and older adults may need to consult a health care provider for individual recommendations. If you have any sort of medical condition, be sure to consult your health care provider before starting a new exercise program. WHAT ARE SOME ACTIVITIES THAT CAN HELP ME TO LOSE WEIGHT?   Walking at a rate of at least 4.5 miles an hour.  Jogging or running at a rate of 5 miles per hour.  Biking at a rate of at least 10 miles per hour.  Lap swimming.  Roller-skating or in-line skating.  Cross-country skiing.  Vigorous competitive sports, such as football, basketball, and soccer.  Jumping rope.  Aerobic dancing. HOW CAN I BE MORE ACTIVE IN MY DAY-TO-DAY ACTIVITIES?  Use the stairs instead of the elevator.  Take a walk during your lunch break.  If you drive, park your car farther away from work or school.  If you take public transportation, get off one stop early and walk the rest of the way.  Make all of your phone calls while standing up and walking around.  Get up, stretch, and walk around every 30 minutes throughout the day. WHAT GUIDELINES SHOULD I FOLLOW WHILE EXERCISING?  Do not exercise so much that you hurt yourself, feel dizzy, or get very short of breath.  Consult your health care provider prior to starting a new exercise program.  Wear  comfortable clothes and shoes with good support.  Drink plenty of water while you exercise to prevent dehydration or heat stroke. Body water is lost during exercise and must be replaced.  Work out until you breathe faster and your heart beats faster.   This information is not intended to replace advice given to you by your health care provider. Make sure you discuss any questions you have with your health care provider.   Document Released: 04/30/2010 Document Revised: 04/18/2014 Document Reviewed: 08/29/2013 Elsevier Interactive Patient Education 2016 [employer]. Exercising to [employer] Exercising can help you to lose weight. In order to lose weight through exercise, you need to do vigorous-intensity exercise. You can tell that you are exercising with vigorous intensity if you are breathing very hard and fast and cannot hold a conversation while exercising. Moderate-intensity exercise helps to maintain your current weight. You can tell that you are exercising at a moderate level if you have a higher heart rate and faster breathing, but you are still able to hold a conversation. HOW OFTEN SHOULD I EXERCISE? Choose an activity that you enjoy and set realistic goals. Your health care provider can help you to make an activity plan that works for you. Exercise regularly as directed by your health care provider. This may include:  Doing resistance training twice each week, such as:  Push-ups.  Sit-ups.  Lifting weights.  Using resistance bands.  Doing a given intensity of exercise for a given amount of time. Choose from these options:  150 minutes of moderate-intensity exercise every week.  75 minutes of vigorous-intensity exercise every  week.  A mix of moderate-intensity and vigorous-intensity exercise every week. Children, pregnant women, people who are out of shape, people who are overweight, and older adults may need to consult a health care provider for individual  recommendations. If you have any sort of medical condition, be sure to consult your health care provider before starting a new exercise program. WHAT ARE SOME ACTIVITIES THAT CAN HELP ME TO LOSE WEIGHT?   Walking at a rate of at least 4.5 miles an hour.  Jogging or running at a rate of 5 miles per hour.  Biking at a rate of at least 10 miles per hour.  Lap swimming.  Roller-skating or in-line skating.  Cross-country skiing.  Vigorous competitive sports, such as football, basketball, and soccer.  Jumping rope.  Aerobic dancing. HOW CAN I BE MORE ACTIVE IN MY DAY-TO-DAY ACTIVITIES?  Use the stairs instead of the elevator.  Take a walk during your lunch break.  If you drive, park your car farther away from work or school.  If you take public transportation, get off one stop early and walk the rest of the way.  Make all of your phone calls while standing up and walking around.  Get up, stretch, and walk around every 30 minutes throughout the day. WHAT GUIDELINES SHOULD I FOLLOW WHILE EXERCISING?  Do not exercise so much that you hurt yourself, feel dizzy, or get very short of breath.  Consult your health care provider prior to starting a new exercise program.  Wear comfortable clothes and shoes with good support.  Drink plenty of water while you exercise to prevent dehydration or heat stroke. Body water is lost during exercise and must be replaced.  Work out until you breathe faster and your heart beats faster.   This information is not intended to replace advice given to you by your health care provider. Make sure you discuss any questions you have with your health care provider.   Document Released: 04/30/2010 Document Revised: 04/18/2014 Document Reviewed: 08/29/2013 Elsevier Interactive Patient Education [employer].

## 2015-04-09 NOTE — Progress Notes (Signed)
Subjective:    Patient ID: [name], female    DOB: 1970-06-26, 44 y.o.   MRN: [id number]  HPI  patient is here today for annual physical. Patient feels well and has no complaints. Also reviewed chronic medical conditions, interval events and current concerns  Past Medical History  Diagnosis Date  . POLYCYSTIC OVARIAN DISEASE   . OBESITY   . SMOKER   . OBSTRUCTIVE SLEEP APNEA   . DELAYED GASTRIC EMPTYING   . DEPRESSION   . GERD   . Anxiety   . ASTHMA    Family History  Problem Relation Age of Onset  . Breast cancer Mother     dx 11/2009  . Diabetes Maternal Aunt   . Prostate cancer Maternal Grandfather 61    also hx colon polyps  . Heart disease Paternal Grandmother   . Pancreatic cancer Paternal Grandmother 56   Social History  Substance Use Topics  . Smoking status: Current Some Day Smoker -- 6.00 packs/day    Types: Cigarettes    Start date: 10/23/1987  . Smokeless tobacco: Never Used  . Alcohol Use: 0.0 oz/week    0 Standard drinks or equivalent per week     Comment: Rarely    Review of Systems  Constitutional: Positive for fatigue. Negative for unexpected weight change.  Respiratory: Negative for cough, shortness of breath and wheezing.   Cardiovascular: Negative for chest pain, palpitations and leg swelling.  Gastrointestinal: Negative for nausea, abdominal pain and diarrhea.  Neurological: Negative for dizziness, weakness, light-headedness and headaches.  Psychiatric/Behavioral: Negative for dysphoric mood. The patient is not nervous/anxious.   All other systems reviewed and are negative.  Patient Care Team: Rowe Clack, MD as PCP - General Bobbye Charleston, MD as Consulting Physician (Obstetrics and Gynecology) Chesley Mires, MD as Consulting Physician (Pulmonary Disease) Lafayette Dragon, MD as Consulting Physician (Gastroenterology) Lelon Perla, MD (Cardiology)     Objective:    Physical Exam  Constitutional: She is oriented to  person, place, and time. She appears well-developed and well-nourished. No distress.  obese  HENT:  Head: Normocephalic and atraumatic.  Right Ear: External ear normal.  Left Ear: External ear normal.  Nose: Nose normal.  Mouth/Throat: Oropharynx is clear and moist. No oropharyngeal exudate.  Eyes: EOM are normal. Pupils are equal, round, and reactive to light. Right eye exhibits no discharge. Left eye exhibits no discharge. No scleral icterus.  Neck: Normal range of motion. Neck supple. No JVD present. No tracheal deviation present. No thyromegaly present.  Cardiovascular: Normal rate, regular rhythm, normal heart sounds and intact distal pulses.  Exam reveals no friction rub.   No murmur heard. Pulmonary/Chest: Effort normal and breath sounds normal. No respiratory distress. She has no wheezes. She has no rales. She exhibits no tenderness.  Abdominal: Soft. Bowel sounds are normal. She exhibits no distension and no mass. There is no tenderness. There is no rebound and no guarding.  Musculoskeletal: Normal range of motion.  Lymphadenopathy:    She has no cervical adenopathy.  Neurological: She is alert and oriented to person, place, and time. She has normal reflexes. No cranial nerve deficit.  Skin: Skin is warm and dry. No rash noted. She is not diaphoretic. No erythema.  Psychiatric: She has a normal mood and affect. Her behavior is normal. Judgment and thought content normal.  Nursing note and vitals reviewed.   BP 128/78 mmHg  Pulse 91  Temp(Src) 98.3 F (36.8 C) (Oral)  Resp 20  Ht 5' 5"  (1.651 m)  Wt 324 lb (146.965 kg)  BMI 53.92 kg/m2  SpO2 96% Wt Readings from Last 3 Encounters:  04/09/15 324 lb (146.965 kg)  01/02/14 311 lb 12 oz (141.409 kg)  10/22/13 308 lb 3.2 oz (139.799 kg)     Lab Results  Component Value Date   WBC 9.0 02/18/2015   HGB 12.4 02/18/2015   HCT 38.5 08/16/2012   PLT 363 02/18/2015   GLUCOSE 83 08/16/2012   CHOL 208* 02/18/2015   TRIG 117  02/18/2015   HDL 37 02/18/2015   LDLDIRECT 154.1 08/16/2012   LDLCALC 148 02/18/2015   ALT 10 02/18/2015   AST 7* 02/18/2015   NA 138 02/18/2015   K 4.3 02/18/2015   CL 104 08/16/2012   CREATININE 0.8 02/18/2015   BUN 10 02/18/2015   CO2 26 08/16/2012   TSH 2.30 02/18/2015   HGBA1C 5.82 02/18/2015   Lab Results  Component Value Date   VITAMINB12 282 01/05/2011    No results found.     Assessment & Plan:   CPX/z00.00 - Patient has been counseled on age-appropriate routine health concerns for screening and prevention. These are reviewed and up-to-date. Immunizations are up-to-date or declined. Labs ordered and reviewed.  Fatigue, other - nonspecific symptoms/exam - check screening labs B12 and Vit D - likely related to MO -also reviewed negative cardiac evaluation summer 2015 (normal echo and negative ETT 11/07/13)   Problem List Items Addressed This Visit    Depression    On low dose Paxil since 2011 - intermittent compliance until 2012 Also uses prn xanax for anxiety and panic attacks - last fill xanax 2014, ok to refill today (03/2015) symptoms stable on current meds - The current medical regimen is effective;  continue present plan and medications.       Relevant Medications   ALPRAZolam (XANAX) 0.5 MG tablet   Glucose intolerance (impaired glucose tolerance)    Metabolic syndrome associated with PCOS -long hx ammenorrhea but resumed cycle briefly after starting metformin DC'd metformin summer 2015 at direction of gyn as rx no longer needed for PCOS management Periodically check a1c to monitor insulin resistance in setting of continued weight gain -   Reviewed importance of regular exercise, low carb diet and weight reduction in management of this condition  Lab Results  Component Value Date   HGBA1C 5.82 02/18/2015        Morbid obesity ([place])    Wt Readings from Last 3 Encounters:  04/09/15 324 lb (146.965 kg)  01/02/14 311 lb 12 oz (141.409 kg)  10/22/13 308  lb 3.2 oz (139.799 kg)   Obstacles related to health reviewed in depth including options for weight loss -  pt will continue to work on diet/exercise efforts We'll also use phentermine to help patient achieve weight loss goals. Explained potential side effects- if problems patient will discontinue same and notify us if problems.  Patient will also follow up with new primary care physician in the next 4-6 weeks for before any refills are given to review weight loss changes and continue evaluation of risk versus benefit of continued treatement      Relevant Medications   phentermine 37.5 MG capsule   SMOKER    5 minutes today spent counseling patient on unhealthy effects of continued tobacco abuse and encouragement of cessation including medical options available to help the patient quit smoking.       Other Visit Diagnoses    Routine general medical examination  at a health care facility    -  Primary    Other fatigue        Relevant Orders    Vitamin B12    VITAMIN D 25 Hydroxy (Vit-D Deficiency, Fractures)        Gwendolyn Grant, MD

## 2015-04-09 NOTE — Assessment & Plan Note (Signed)
5 minutes today spent counseling patient on unhealthy effects of continued tobacco abuse and encouragement of cessation including medical options available to help the patient quit smoking.

## 2015-04-09 NOTE — Addendum Note (Signed)
Addended by: Gwendolyn Grant A on: 04/09/2015 12:16 PM   Modules accepted: Orders, SmartSet

## 2015-04-09 NOTE — Addendum Note (Signed)
Addended by: Aviva Signs M on: 04/09/2015 03:47 PM   Modules accepted: Miquel Dunn

## 2015-04-09 NOTE — Assessment & Plan Note (Signed)
Wt Readings from Last 3 Encounters:  04/09/15 324 lb (146.965 kg)  01/02/14 311 lb 12 oz (141.409 kg)  10/22/13 308 lb 3.2 oz (139.799 kg)   Obstacles related to health reviewed in depth including options for weight loss -  pt will continue to work on diet/exercise efforts We'll also use phentermine to help patient achieve weight loss goals. Explained potential side effects- if problems patient will discontinue same and notify us if problems.  Patient will also follow up with new primary care physician in the next 4-6 weeks for before any refills are given to review weight loss changes and continue evaluation of risk versus benefit of continued treatement

## 2015-04-15 ENCOUNTER — Ambulatory Visit (INDEPENDENT_AMBULATORY_CARE_PROVIDER_SITE_OTHER): Payer: 59

## 2015-04-15 DIAGNOSIS — E538 Deficiency of other specified B group vitamins: Secondary | ICD-10-CM | POA: Diagnosis not present

## 2015-04-15 MED ORDER — CYANOCOBALAMIN 1000 MCG/ML IJ SOLN
1000.0000 ug | Freq: Once | INTRAMUSCULAR | Status: AC
  Administered 2015-04-15: 1000 ug via INTRAMUSCULAR

## 2015-04-16 ENCOUNTER — Other Ambulatory Visit: Payer: Self-pay | Admitting: Internal Medicine

## 2015-04-16 MED ORDER — PAROXETINE HCL 20 MG PO TABS: 20.0000 mg | ORAL_TABLET | Freq: Every day | ORAL | Status: DC

## 2015-04-16 NOTE — Addendum Note (Signed)
Addended by: Karle Barr on: 04/16/2015 08:43 AM   Modules accepted: Orders

## 2015-04-21 ENCOUNTER — Encounter: Payer: Self-pay | Admitting: Internal Medicine

## 2015-04-30 ENCOUNTER — Encounter: Payer: Self-pay | Admitting: Internal Medicine

## 2015-04-30 ENCOUNTER — Ambulatory Visit (INDEPENDENT_AMBULATORY_CARE_PROVIDER_SITE_OTHER): Payer: 59 | Admitting: Internal Medicine

## 2015-04-30 VITALS — BP 148/88 | HR 92 | Temp 97.7°F | Resp 16 | Ht 65.0 in | Wt 318.0 lb

## 2015-04-30 DIAGNOSIS — G4733 Obstructive sleep apnea (adult) (pediatric): Secondary | ICD-10-CM

## 2015-04-30 DIAGNOSIS — E538 Deficiency of other specified B group vitamins: Secondary | ICD-10-CM | POA: Diagnosis not present

## 2015-04-30 DIAGNOSIS — E559 Vitamin D deficiency, unspecified: Secondary | ICD-10-CM | POA: Diagnosis not present

## 2015-04-30 DIAGNOSIS — F17200 Nicotine dependence, unspecified, uncomplicated: Secondary | ICD-10-CM

## 2015-04-30 DIAGNOSIS — F329 Major depressive disorder, single episode, unspecified: Secondary | ICD-10-CM

## 2015-04-30 DIAGNOSIS — F419 Anxiety disorder, unspecified: Secondary | ICD-10-CM

## 2015-04-30 DIAGNOSIS — F32A Depression, unspecified: Secondary | ICD-10-CM

## 2015-04-30 MED ORDER — PAROXETINE HCL 20 MG PO TABS: 20.0000 mg | ORAL_TABLET | Freq: Every day | ORAL | Status: DC

## 2015-04-30 NOTE — Assessment & Plan Note (Addendum)
B12 injections monthly since she did not absorb oral B12 We will recheck B12 level in 3 months just before her injection

## 2015-04-30 NOTE — Assessment & Plan Note (Signed)
Went for second opinion - calcium buildup only

## 2015-04-30 NOTE — Assessment & Plan Note (Signed)
Will try to quit - smokes 5-7 cig / day Discussed quitting, but not ready now

## 2015-04-30 NOTE — Assessment & Plan Note (Signed)
Panic attacks on occasion Takes xanax rarely

## 2015-04-30 NOTE — Progress Notes (Signed)
Pre visit review using our clinic review tool, if applicable. No additional management support is needed unless otherwise documented below in the visit note. 

## 2015-04-30 NOTE — Assessment & Plan Note (Signed)
Controlled Continue paxil at current dose

## 2015-04-30 NOTE — Assessment & Plan Note (Signed)
Last visit with pulmonary said she did not qualify for treatment. Currently working on weight loss. Consider reevaluation after weight loss if needed

## 2015-04-30 NOTE — Assessment & Plan Note (Signed)
Taking high dose vitamin d now Recheck level in 3 months

## 2015-04-30 NOTE — Patient Instructions (Addendum)
Continue doing what you are doing.  Follow up with me in 3 months - have blood work done (ordered) just before our appointment.  We will give you your B12 injection at the appointment.

## 2015-04-30 NOTE — Progress Notes (Signed)
Subjective:    Patient ID: [name], female    DOB: [birth year], 45 y.o.   MRN: [id number]  HPI She is here to establish with a new pcp.  She is here for routine follow up.  Depression, anxiety: She is taking her medication daily as prescribed. She denies any side effects from the medication. She feels her depression and anxiety are well controlled and she is happy with her current dose of medication. She has intermittent panic attacks and uses xanax on occasion - she does not use it often.    Obesity:  She was prescribed phentermine at her last visit 04/09/15, but she has not taken it.  She prefers not to take medication if she can avoid it.  She has been trying to move more and walk.  She has also made lifestyle changes - stopped drinking mountain dew and has decreased her portions.   She is making better food choices.  She like knowing the phentermine is there in case she needs it, but would ideally like to make the lifestyle changes and lose weight tablet.   She has lost 6 pounds since she was here last and feels much better.  Vitamin B-12 deficient: She was found to be vitamin B12 deficient. She has not absorbed the oral B12 and had her first B12 injection last month. She feels so much more energy since then. She feels that she is able to exercise because of that.  Vitamin D deficient: She is also started on high-dose vitamin D and thinks that is helping her energy as well. She is excited to just feel better.  Medications and allergies reviewed with patient and updated if appropriate.  Patient Active Problem List   Diagnosis Date Noted  . Glucose intolerance (impaired glucose tolerance) 04/09/2015  . Morbid obesity ([place]) 04/09/2015  . Vitamin D deficiency   . Vitamin B12 deficiency   . Anxiety   . SMOKER 01/28/2009  . Depression 01/28/2009  . GERD 01/28/2009  . POLYCYSTIC OVARIAN DISEASE 02/10/2007  . Obstructive sleep apnea 02/10/2007  . Asthma 02/10/2007  . DELAYED GASTRIC  EMPTYING 02/10/2007    Current Outpatient Prescriptions on File Prior to Visit  Medication Sig Dispense Refill  . albuterol (PROAIR HFA) 108 (90 BASE) MCG/ACT inhaler Inhale 2 puffs into the lungs every 4 (four) hours as needed. 18 g 2  . ALPRAZolam (XANAX) 0.5 MG tablet Take 1 tablet (0.5 mg total) by mouth every 8 (eight) hours as needed for sleep or anxiety. 30 tablet 1  . calcium carbonate (TUMS - DOSED IN MG ELEMENTAL CALCIUM) 500 MG chewable tablet Chew 2 tablets by mouth daily.      . cyanocobalamin (,VITAMIN B-12,) 1000 MCG/ML injection Inject 1 mL (1,000 mcg total) into the muscle every 30 (thirty) days. 1 mL 0  . ergocalciferol (VITAMIN D2) 50000 units capsule Take 1 capsule (50,000 Units total) by mouth once a week. 4 capsule 3  . phentermine 37.5 MG capsule Take 1 capsule (37.5 mg total) by mouth every morning. 30 capsule 0  . promethazine (PHENERGAN) 25 MG tablet 1 BY MOUTH EVERY 4 HOURS AS NEEDED FOR NAUSEA 30 tablet 0   No current facility-administered medications on file prior to visit.    Past Medical History  Diagnosis Date  . POLYCYSTIC OVARIAN DISEASE   . OBESITY   . SMOKER   . OBSTRUCTIVE SLEEP APNEA   . DELAYED GASTRIC EMPTYING   . DEPRESSION   . GERD   .  Anxiety   . ASTHMA   . Vitamin D deficiency 03/2015 dx  . Vitamin B12 deficiency 03/2015 dx    start IM replacement q 30d    Past Surgical History  Procedure Laterality Date  . Cocxyl removal  1995  . Dilation and curettage of uterus    . Tubal ligation  1998  . Cesarean section  97 & 98    x's 2  . Laparoscopy      Social History   Social History  . Marital Status: Married    Spouse Name: N/A  . Number of Children: 2  . 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 Smoking status: Current Some Day Smoker -- 6.00 packs/day    Types: Cigarettes    Start date: 10/23/1987  . Smokeless tobacco: Never Used  . Alcohol Use: 0.0 oz/week    0  Standard drinks or equivalent per week     Comment: Rarely  . Drug Use: No  . Sexual Activity: Not Asked   Other Topics Concern  . None   Social History Narrative   Lives with husband & 2 kids from 1st marriage. (1st husband expired 1998-leukemia) work as Merchandiser, retail, but currently unemployed    Family History  Problem Relation Age of Onset  . Breast cancer Mother     dx 11/2009  . Diabetes Maternal Aunt   . Prostate cancer Maternal Grandfather 51    also hx colon polyps  . Heart disease Paternal Grandmother   . Pancreatic cancer Paternal Grandmother 77    Review of Systems  Constitutional: Negative for fever and chills.  Respiratory: Negative for cough, shortness of breath and wheezing.   Cardiovascular: Positive for palpitations (Occasionally with activity-improving the more she is active). Negative for chest pain and leg swelling.  Neurological: Negative for dizziness, light-headedness and headaches.       Objective:   Filed Vitals:   04/30/15 0829  BP: 148/88  Pulse: 92  Temp: 97.7 F (36.5 C)  Resp: 16   Filed Weights   04/30/15 0829  Weight: 318 lb (144.244 kg)   Body mass index is 52.92 kg/(m^2).   Physical Exam Constitutional: Appears well-developed and well-nourished. No distress.  Neck: Neck supple. No tracheal deviation present. No thyromegaly present.  No carotid bruit. No cervical adenopathy.   Cardiovascular: Normal rate, regular rhythm and normal heart sounds.   No murmur heard.  No edema Pulmonary/Chest: Effort normal and breath sounds normal. No respiratory distress. No wheezes.  Psychiatric: Normal mood and affect       Assessment & Plan:   See Problem List for Assessment and Plan of chronic medical problems.  Follow-up in 3 months-she will have blood work done prior to check vitamin D and vitamin B12 levels. We will give her another vitamin B12 injection at that time

## 2015-05-20 ENCOUNTER — Ambulatory Visit (INDEPENDENT_AMBULATORY_CARE_PROVIDER_SITE_OTHER): Payer: 59

## 2015-05-20 DIAGNOSIS — E538 Deficiency of other specified B group vitamins: Secondary | ICD-10-CM

## 2015-05-20 MED ORDER — CYANOCOBALAMIN 1000 MCG/ML IJ SOLN
1000.0000 ug | Freq: Once | INTRAMUSCULAR | Status: AC
  Administered 2015-05-20: 1000 ug via INTRAMUSCULAR

## 2015-06-17 ENCOUNTER — Ambulatory Visit (INDEPENDENT_AMBULATORY_CARE_PROVIDER_SITE_OTHER): Payer: 59

## 2015-06-17 DIAGNOSIS — E538 Deficiency of other specified B group vitamins: Secondary | ICD-10-CM

## 2015-06-17 MED ORDER — CYANOCOBALAMIN 1000 MCG/ML IJ SOLN
1000.0000 ug | Freq: Once | INTRAMUSCULAR | Status: AC
  Administered 2015-06-17: 1000 ug via INTRAMUSCULAR

## 2015-07-02 ENCOUNTER — Other Ambulatory Visit: Payer: Self-pay | Admitting: Internal Medicine

## 2015-07-16 ENCOUNTER — Other Ambulatory Visit (INDEPENDENT_AMBULATORY_CARE_PROVIDER_SITE_OTHER): Payer: 59

## 2015-07-16 ENCOUNTER — Ambulatory Visit (INDEPENDENT_AMBULATORY_CARE_PROVIDER_SITE_OTHER): Payer: 59 | Admitting: Internal Medicine

## 2015-07-16 ENCOUNTER — Encounter: Payer: Self-pay | Admitting: Internal Medicine

## 2015-07-16 VITALS — BP 128/86 | HR 80 | Temp 97.9°F | Resp 18 | Wt 319.0 lb

## 2015-07-16 DIAGNOSIS — E559 Vitamin D deficiency, unspecified: Secondary | ICD-10-CM

## 2015-07-16 DIAGNOSIS — F32A Depression, unspecified: Secondary | ICD-10-CM

## 2015-07-16 DIAGNOSIS — E538 Deficiency of other specified B group vitamins: Secondary | ICD-10-CM

## 2015-07-16 DIAGNOSIS — F419 Anxiety disorder, unspecified: Secondary | ICD-10-CM

## 2015-07-16 DIAGNOSIS — F329 Major depressive disorder, single episode, unspecified: Secondary | ICD-10-CM

## 2015-07-16 LAB — VITAMIN B12: Vitamin B-12: 355 pg/mL (ref 211–911)

## 2015-07-16 MED ORDER — ACYCLOVIR 5 % EX OINT: 1.0000 "application " | TOPICAL_OINTMENT | CUTANEOUS | Status: DC

## 2015-07-16 MED ORDER — CYANOCOBALAMIN 1000 MCG/ML IJ SOLN
1000.0000 ug | INTRAMUSCULAR | Status: DC
Start: 2015-07-16 — End: 2016-01-22
  Administered 2015-07-16: 1000 ug via INTRAMUSCULAR

## 2015-07-16 NOTE — Assessment & Plan Note (Signed)
Still taking 50000 units  - when finished will start 2000 units daily Level checked today

## 2015-07-16 NOTE — Patient Instructions (Addendum)
When finished with the high dose vitamin D start taking 2000 units of vitamin D daily (buy over the counter).  I will let her know how your vitamin levels are.   Medications reviewed and updated.  No changes recommended at this time.  A referral was ordered for nutrition.   Please followup in 6 months

## 2015-07-16 NOTE — Progress Notes (Signed)
Subjective:    Patient ID: [name], female    DOB: 05/26/1970, 45 y.o.   MRN: [id number]  HPI  She is here today for follow up.   B12 deficiency:  She is getting B12 injections monthly and has seen an increase in her energy level.   Vitamin D Deficiency:  She is taking vitamin D daily.  Her energy level has also improved since starting the vitamin D.   Obesity:  She walks every day 2 miles each time. She is trying to decrease her portions.  She is eating everything she wants, but less of it.  Instead of two pieces of pizza she will only eat one. She is not drinking any soda, but still drinks sweet tea. She drinks water most of the time.  She has made a lot of more healthy food choices.  She eats a protein bar from breakfast (90cal) or two boiled eggs, snack she has a different protein bar, salad for lunch - one packet of dressing ( we looked it up and the dressing is 200 cal). Salad is 190.   Depression, anxiety: She is taking her medication daily as prescribed and the xanax only as needed ( not often). She denies any side effects from the medication. She feels her depression and anxiety are well controlled and she is happy with her current dose of medication.     Medications and allergies reviewed with patient and updated if appropriate.  Patient Active Problem List   Diagnosis Date Noted  . Glucose intolerance (impaired glucose tolerance) 04/09/2015  . Morbid obesity ([place]) 04/09/2015  . Vitamin D deficiency   . Vitamin B12 deficiency   . Anxiety   . SMOKER 01/28/2009  . Depression 01/28/2009  . GERD 01/28/2009  . POLYCYSTIC OVARIAN DISEASE 02/10/2007  . Obstructive sleep apnea 02/10/2007  . Asthma 02/10/2007  . DELAYED GASTRIC EMPTYING 02/10/2007    Current Outpatient Prescriptions on File Prior to Visit  Medication Sig Dispense Refill  . albuterol (PROAIR HFA) 108 (90 BASE) MCG/ACT inhaler Inhale 2 puffs into the lungs every 4 (four) hours as needed. 18 g 2  .  ALPRAZolam (XANAX) 0.5 MG tablet Take 1 tablet (0.5 mg total) by mouth every 8 (eight) hours as needed for sleep or anxiety. 30 tablet 1  . calcium carbonate (TUMS - DOSED IN MG ELEMENTAL CALCIUM) 500 MG chewable tablet Chew 2 tablets by mouth daily.      . cyanocobalamin (,VITAMIN B-12,) 1000 MCG/ML injection Inject 1 mL (1,000 mcg total) into the muscle every 30 (thirty) days. 1 mL 0  . PARoxetine (PAXIL) 20 MG tablet Take 1 tablet (20 mg total) by mouth daily. 90 tablet 3  . phentermine 37.5 MG capsule Take 1 capsule (37.5 mg total) by mouth every morning. 30 capsule 0  . promethazine (PHENERGAN) 25 MG tablet 1 BY MOUTH EVERY 4 HOURS AS NEEDED FOR NAUSEA 30 tablet 0  . Vitamin D, Ergocalciferol, (DRISDOL) 50000 units CAPS capsule TAKE 1 CAPSULE (50,000 UNITS TOTAL) BY MOUTH ONCE A WEEK. 4 capsule 1   No current facility-administered medications on file prior to visit.    Past Medical History  Diagnosis Date  . POLYCYSTIC OVARIAN DISEASE   . OBESITY   . SMOKER   . OBSTRUCTIVE SLEEP APNEA   . DELAYED GASTRIC EMPTYING   . DEPRESSION   . GERD   . Anxiety   . ASTHMA   . Vitamin D deficiency 03/2015 dx  . Vitamin B12  deficiency 03/2015 dx    start IM replacement q 30d    Past Surgical History  Procedure Laterality Date  . Cocxyl removal  1995  . Dilation and curettage of uterus    . Tubal ligation  1998  . Cesarean section  97 & 98    x's 2  . Laparoscopy      Social History   Social History  . Marital Status: Married    Spouse Name: N/A  . Number of Children: 2  . 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 Smoking status: Current Some Day Smoker -- 6.00 packs/day    Types: Cigarettes    Start date: 10/23/1987  . Smokeless tobacco: Never Used  . Alcohol Use: 0.0 oz/week    0 Standard drinks or equivalent per week     Comment: Rarely  . Drug Use: No  . Sexual Activity: Not on file   Other Topics Concern  . Not on  file   Social History Narrative   Lives with husband & 2 kids from 1st marriage. (1st husband expired 1998-leukemia) work as Merchandiser, retail, but currently unemployed    Family History  Problem Relation Age of Onset  . Breast cancer Mother     dx 11/2009  . Diabetes Maternal Aunt   . Prostate cancer Maternal Grandfather 6    also hx colon polyps  . Heart disease Paternal Grandmother   . Pancreatic cancer Paternal Grandmother 39    Review of Systems  Constitutional: Negative for fever.  Respiratory: Negative for cough, shortness of breath and wheezing.   Cardiovascular: Negative for chest pain, palpitations and leg swelling.  Neurological: Positive for light-headedness (occasional). Negative for dizziness and headaches.       Objective:   Filed Vitals:   07/16/15 0817  BP: 128/86  Pulse: 80  Temp: 97.9 F (36.6 C)  Resp: 18   Filed Weights   07/16/15 0817  Weight: 319 lb (144.697 kg)   Body mass index is 53.08 kg/(m^2).   Physical Exam Constitutional: Appears well-developed and well-nourished. No distress.   Cardiovascular: Normal rate, regular rhythm and normal heart sounds.   No murmur heard.  No edema Pulmonary/Chest: Effort normal and breath sounds normal. No respiratory distress. No wheezes.         Assessment & Plan:    See Problem List for Assessment and Plan of chronic medical problems.  F/u in 6 months

## 2015-07-16 NOTE — Assessment & Plan Note (Signed)
Controlled, stable Continue current meds

## 2015-07-16 NOTE — Progress Notes (Signed)
Pre visit review using our clinic review tool, if applicable. No additional management support is needed unless otherwise documented below in the visit note. 

## 2015-07-16 NOTE — Assessment & Plan Note (Signed)
Discussed her weight loss - reviewed calorie counting - discussed decreasing calories Refer to nutrition Has phentermine - but has not taken it - would like to avoid it Continue regular exercise

## 2015-07-16 NOTE — Assessment & Plan Note (Signed)
B12 injection today Continue monthly B12 injections

## 2015-07-20 LAB — VITAMIN D 1,25 DIHYDROXY
Vitamin D 1, 25 (OH)2 Total: 22 pg/mL (ref 18–72)
Vitamin D2 1, 25 (OH)2: 11 pg/mL
Vitamin D3 1, 25 (OH)2: 11 pg/mL

## 2015-07-21 ENCOUNTER — Encounter: Payer: Self-pay | Admitting: Internal Medicine

## 2015-07-26 MED ORDER — VITAMIN D (ERGOCALCIFEROL) 1.25 MG (50000 UNIT) PO CAPS: ORAL_CAPSULE | ORAL | Status: AC

## 2015-07-28 ENCOUNTER — Ambulatory Visit (INDEPENDENT_AMBULATORY_CARE_PROVIDER_SITE_OTHER): Payer: 59 | Admitting: Family

## 2015-07-28 ENCOUNTER — Other Ambulatory Visit: Payer: 59

## 2015-07-28 ENCOUNTER — Encounter: Payer: Self-pay | Admitting: Family

## 2015-07-28 VITALS — BP 120/98 | HR 80 | Temp 97.8°F | Ht 65.0 in | Wt 322.5 lb

## 2015-07-28 DIAGNOSIS — L0100 Impetigo, unspecified: Secondary | ICD-10-CM

## 2015-07-28 DIAGNOSIS — B001 Herpesviral vesicular dermatitis: Secondary | ICD-10-CM | POA: Diagnosis not present

## 2015-07-28 MED ORDER — VALACYCLOVIR HCL 1 G PO TABS: ORAL_TABLET | ORAL | Status: DC

## 2015-07-28 MED ORDER — DOXYCYCLINE HYCLATE 100 MG PO TBEC: 100.0000 mg | DELAYED_RELEASE_TABLET | Freq: Two times a day (BID) | ORAL | Status: DC

## 2015-07-28 NOTE — Progress Notes (Signed)
Pre visit review using our clinic review tool, if applicable. No additional management support is needed unless otherwise documented below in the visit note. 

## 2015-07-28 NOTE — Progress Notes (Signed)
Subjective:    Patient ID: [name], female    DOB: Nov 15, 1970, 45 y.o.   MRN: [id number]   [name] is a 45 y.o. female who presents today for an acute visit.    HPI Comments: Sores on outside of mouth for two days worsening/spreading. Pain is mild. Burning, throbbing, tingling pain. Endorses nausea and chills. Sores on chin drained yellow fluid.  Denies fever, body aches,unsual joint pain. Had prior instance of these lesions treated with steroid injection, antibiotic injection, and oral antibiotics at an urgent care. Has tried acyclovir ointment , herpacin ointment on lesions with no relief.  H/o of herpetic lesions.   Past Medical History  Diagnosis Date  . POLYCYSTIC OVARIAN DISEASE   . OBESITY   . SMOKER   . OBSTRUCTIVE SLEEP APNEA   . DELAYED GASTRIC EMPTYING   . DEPRESSION   . GERD   . Anxiety   . ASTHMA   . Vitamin D deficiency 03/2015 dx  . Vitamin B12 deficiency 03/2015 dx    start IM replacement q 30d   Review of patient's allergies indicates no known allergies. Current Outpatient Prescriptions on File Prior to Visit  Medication Sig Dispense Refill  . acyclovir ointment (ZOVIRAX) 5 % Apply 1 application topically every 3 (three) hours. 30 g 5  . albuterol (PROAIR HFA) 108 (90 BASE) MCG/ACT inhaler Inhale 2 puffs into the lungs every 4 (four) hours as needed. 18 g 2  . ALPRAZolam (XANAX) 0.5 MG tablet Take 1 tablet (0.5 mg total) by mouth every 8 (eight) hours as needed for sleep or anxiety. 30 tablet 1  . calcium carbonate (TUMS - DOSED IN MG ELEMENTAL CALCIUM) 500 MG chewable tablet Chew 2 tablets by mouth daily.      . cyanocobalamin (,VITAMIN B-12,) 1000 MCG/ML injection Inject 1 mL (1,000 mcg total) into the muscle every 30 (thirty) days. 1 mL 0  . PARoxetine (PAXIL) 20 MG tablet Take 1 tablet (20 mg total) by mouth daily. 90 tablet 3  . phentermine 37.5 MG capsule Take 1 capsule (37.5 mg total) by mouth every morning. 30 capsule 0  . promethazine  (PHENERGAN) 25 MG tablet 1 BY MOUTH EVERY 4 HOURS AS NEEDED FOR NAUSEA 30 tablet 0  . Vitamin D, Ergocalciferol, (DRISDOL) 50000 units CAPS capsule TAKE 1 CAPSULE (50,000 UNITS TOTAL) BY MOUTH ONCE A WEEK. 4 capsule 0   Current Facility-Administered Medications on File Prior to Visit  Medication Dose Route Frequency Provider Last Rate Last Dose  . cyanocobalamin ((VITAMIN B-12)) injection 1,000 mcg  1,000 mcg Intramuscular Q30 days Binnie Rail, MD   1,000 mcg at 07/16/15 0830    Social History  Substance Use Topics  . Smoking status: Current Some Day Smoker -- 6.00 packs/day    Types: Cigarettes    Start date: 10/23/1987  . Smokeless tobacco: Never Used  . Alcohol Use: 0.0 oz/week    0 Standard drinks or equivalent per week     Comment: Rarely    Review of Systems  Constitutional: Positive for chills. Negative for fever and unexpected weight change.  HENT: Negative for congestion, ear pain, sinus pressure and sore throat.   Eyes: Negative for visual disturbance.  Respiratory: Negative for cough, shortness of breath and wheezing.   Cardiovascular: Negative for chest pain, palpitations and leg swelling.  Gastrointestinal: Positive for nausea. Negative for vomiting.  Musculoskeletal: Negative for myalgias and arthralgias.  Skin: Negative for rash.  Neurological: Negative for headaches.  Hematological:  Negative for adenopathy.      Objective:    BP 120/98 mmHg  Pulse 80  Temp(Src) 97.8 F (36.6 C) (Oral)  Ht 5' 5"  (1.651 m)  Wt 322 lb 8 oz (146.285 kg)  BMI 53.67 kg/m2  SpO2 97%   Physical Exam  Constitutional: She appears well-developed and well-nourished.  Eyes: Conjunctivae are normal.  Cardiovascular: Normal rate, regular rhythm, normal heart sounds and normal pulses.   Pulmonary/Chest: Effort normal and breath sounds normal. She has no wheezes. She has no rhonchi. She has no rales.  Neurological: She is alert.  Skin: Skin is warm and dry. Rash noted. Rash is  macular. Rash is not vesicular.  Largest lesion noted on lower lip, scabbed over, non draining and approx 2 cm in diameter.  Smaller lesion noted on upper lip and scabbed over.  3 discrete small honey colored scabs noted on chin.     Psychiatric: She has a normal mood and affect. Her speech is normal and behavior is normal. Thought content normal.  Vitals reviewed.      Assessment & Plan:   1. Impetigo Adherent honey colored scabs which had drained yellow fluid per patient support non bullous impetigo.   - doxycycline (DORYX) 100 MG EC tablet; Take 1 tablet (100 mg total) by mouth 2 (two) times daily.  Dispense: 14 tablet; Refill: 0 - Ambulatory referral to Dermatology - Wound culture; Future -advised benadryl for itching and pain at night  2. Recurrent herpes labialis Suspect lesion on labialis is herpetic as patient endorses tingling. Reasonable to try oral Valtrex as topical antivirals have not offered relief.   - valACYclovir (VALTREX) 1000 MG tablet; Take two tablets ( total 2000 mg) by mouth q12h x 1 day; Start: ASAP after symptom onset  Dispense: 6 tablet; Refill: 2   I am having [name] start on doxycycline and valACYclovir. I am also having her maintain her calcium carbonate, promethazine, albuterol, ALPRAZolam, phentermine, cyanocobalamin, PARoxetine, acyclovir ointment, and Vitamin D (Ergocalciferol). We will continue to administer cyanocobalamin.   Meds ordered this encounter  Medications  . doxycycline (DORYX) 100 MG EC tablet    Sig: Take 1 tablet (100 mg total) by mouth 2 (two) times daily.    Dispense:  14 tablet    Refill:  0    Order Specific Question:  Supervising Provider    Answer:  [name] [[id number]]  . valACYclovir (VALTREX) 1000 MG tablet    Sig: Take two tablets ( total 2000 mg) by mouth q12h x 1 day; Start: ASAP after symptom onset    Dispense:  6 tablet    Refill:  2    Order Specific Question:  Supervising Provider    Answer:   [name] [[id number]]     Start medications as prescribed and explained to patient on After Visit Summary ( AVS). Risks, benefits, and alternatives of the medications and treatment plan prescribed today were discussed, and patient expressed understanding.   Education regarding symptom management and diagnosis given to patient.   Follow-up:Plan follow-up as discussed or as needed if any worsening symptoms or change in condition. No Follow-up on file.   Continue to follow with Binnie Rail, MD for routine health maintenance.   [name] and I agreed with plan.   Mable Paris, FNP

## 2015-07-28 NOTE — Patient Instructions (Signed)
Referral to derm.   Warm/cold compresses for pain.   If there is no improvement in your symptoms, or if there is any worsening of symptoms, or if you have any additional concerns, please return for re-evaluation; or, if we are closed, consider going to the Emergency Room for evaluation if symptoms urgent.  Impetigo, Adult Impetigo is an infection of the skin. It commonly occurs in young children, but it can also occur in adults. The infection causes itchy blisters and sores that produce brownish-yellow fluid. As the fluid dries, it forms a thick, honey-colored crust. These skin changes usually occur on the face but can also affect other areas of the body. Impetigo usually goes away in 7-10 days with treatment. CAUSES Impetigo is caused by two types of bacteria. It may be caused by staphylococci or streptococci bacteria. These bacteria cause impetigo when they get under the surface of the skin. This often happens after some damage to the skin, such as damage from:  Cuts, scrapes, or scratches.  Insect bites, especially when you scratch the area of a bite.  Chickenpox or other illnesses that cause open skin sores.  Nail biting or chewing. Impetigo is contagious and can spread easily from one person to another. This may occur through close skin contact or by sharing towels, clothing, or other items with a person who has the infection. RISK FACTORS Some things that can increase the risk of getting this infection include:  Playing sports that include skin-to-skin contact with others.  Having a skin condition with open sores.  Having many skin cuts or scrapes.  Living in an area that has high humidity levels.  Having poor hygiene.  Having high levels of staphylococci in your nose. SIGNS AND SYMPTOMS Impetigo usually starts out as small blisters, often on the face. The blisters then break open and turn into tiny sores (lesions) with a yellow crust. In some cases, the blisters cause itching  or burning. With scratching, irritation, or lack of treatment, these small lesions may get larger. Scratching can also cause impetigo to spread to other parts of the body. The bacteria can get under the fingernails and spread when you touch another area of your skin. Other possible symptoms include:  Larger blisters.  Pus.  Swollen lymph glands. DIAGNOSIS This condition is usually diagnosed during a physical exam. A skin sample or sample of fluid from a blister may be taken for lab tests that involve growing bacteria (culture test). This can help confirm the diagnosis or help determine the best treatment. TREATMENT Mild impetigo can be treated with prescription antibiotic cream. Oral antibiotic medicine may be used in more severe cases. Medicines for itching may also be used. HOME CARE INSTRUCTIONS  Take medicines only as directed by your health care provider.  To help prevent impetigo from spreading to other body areas:  Keep your fingernails short and clean.  Do not scratch the blisters or sores.  Cover infected areas, if necessary, to keep from scratching.  Gently wash the infected areas with antibiotic soap and water.  Soak crusted areas in warm, soapy water using antibiotic soap.  Gently rub the areas to remove crusts. Do not scrub.  Wash your hands often to avoid spreading this infection.  Stay home until you have used an antibiotic cream for 48 hours (2 days) or an oral antibiotic medicine for 24 hours (1 day). You should only return to work and activities with other people if your skin shows significant improvement. PREVENTION  To keep  the infection from spreading:  Stay home until you have used an antibiotic cream for 48 hours or an oral antibiotic for 24 hours.  Wash your hands often.  Do not engage in skin-to-skin contact with other people while you have still have blisters.  Do not share towels, washcloths, or bedding with others while you have the  infection. SEEK MEDICAL CARE IF:  You develop more blisters or sores despite treatment.  Other family members get sores.  Your skin sores are not improving after 48 hours of treatment.  You have a fever. SEEK IMMEDIATE MEDICAL CARE IF:  You see spreading redness or swelling of the skin around your sores.  You see red streaks coming from your sores.  You develop a sore throat.   This information is not intended to replace advice given to you by your health care provider. Make sure you discuss any questions you have with your health care provider.   Document Released: 04/18/2014 Document Reviewed: 04/18/2014 Elsevier Interactive Patient Education [employer].

## 2015-07-31 LAB — WOUND CULTURE: Gram Stain: NONE SEEN

## 2015-08-03 ENCOUNTER — Encounter: Payer: Self-pay | Admitting: Family

## 2015-08-12 ENCOUNTER — Ambulatory Visit: Payer: 59 | Admitting: Nutrition

## 2015-08-12 ENCOUNTER — Other Ambulatory Visit: Payer: Self-pay | Admitting: Family Medicine

## 2015-08-12 ENCOUNTER — Ambulatory Visit: Payer: 59

## 2015-08-14 ENCOUNTER — Other Ambulatory Visit: Payer: Self-pay | Admitting: Family Medicine

## 2015-08-14 MED ORDER — PAROXETINE HCL 20 MG PO TABS: 20.0000 mg | ORAL_TABLET | Freq: Every day | ORAL | Status: DC

## 2015-08-14 NOTE — Telephone Encounter (Signed)
Last been by you on 07/16/2015. Last filled 04/30/2015

## 2015-08-19 ENCOUNTER — Ambulatory Visit (INDEPENDENT_AMBULATORY_CARE_PROVIDER_SITE_OTHER): Payer: 59

## 2015-08-19 ENCOUNTER — Encounter: Payer: Self-pay | Admitting: Nutrition

## 2015-08-19 ENCOUNTER — Other Ambulatory Visit: Payer: Self-pay

## 2015-08-19 ENCOUNTER — Encounter: Payer: 59 | Attending: Internal Medicine | Admitting: Nutrition

## 2015-08-19 DIAGNOSIS — E538 Deficiency of other specified B group vitamins: Secondary | ICD-10-CM

## 2015-08-19 DIAGNOSIS — R739 Hyperglycemia, unspecified: Secondary | ICD-10-CM

## 2015-08-19 MED ORDER — CYANOCOBALAMIN 1000 MCG/ML IJ SOLN
1000.0000 ug | Freq: Once | INTRAMUSCULAR | Status: AC
  Administered 2015-08-19: 1000 ug via INTRAMUSCULAR

## 2015-08-19 MED ORDER — PAROXETINE HCL 20 MG PO TABS: 20.0000 mg | ORAL_TABLET | Freq: Every day | ORAL | Status: DC

## 2015-08-19 NOTE — Patient Instructions (Signed)
Goals 1. Follow Plate Method 2. Measure foods out. 3. Don't skip meals 4. No snacks between meals. 5.  Eat dinner before 7 pm. 6. Use exercise band and exercise 30-60 minutes 4 times per week. 7. Lose 1-2  Lbs per week. 8. Keep food journal

## 2015-08-19 NOTE — Progress Notes (Signed)
  Medical Nutrition Therapy:  Appt start time: 1130 end time:  1230.  Assessment:  Primary concerns today: Obesity and prediabetes. A1C 5.8%.  Lives with her husband and college kids. She and her husband do the shopping and cooking. 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 Eats 2 meals per day. She admits to eating on the run a lot and doesn't take time for meals. Has gained 100 lbs in the last year. Was on Metformin 500 mg per day but took her self off of it.. Has a FITBIT and is trying to walk more and get 10,000 steps in a day. Diet is excessive in fat, calories and low in fresh fruits and vegetables.   Preferred Learning Style  Visual  Learning Readiness:   Ready  Change in progress  MEDICATIONS: see list   DIETARY INTAKE    24-hr recall:  B ( AM): 2 boiled eggs or egg mcmuffin and water Snk ( AM): none  L ( PM): Grilled chicken sandwich from [employer], water and trail mix  Snk ( PM): none D ( PM): Smart one spaghetti, water Snk ( PM):  Beverages: water  Usual physical activity: Fit bit walking.  Estimated energy needs: 1800 calories 200 g carbohydrates 135 g protein 50 g fat  Progress Towards Goal(s):  In progress.   Nutritional Diagnosis:  NB-1.1 Food and nutrition-related knowledge deficit As related to Obesity.  As evidenced by BMI > 40.    Intervention: Nutrition and Diabetes education provided on My Plate, CHO counting, meal planning, portion sizes, timing of meals, avoiding snacks between meals unless having a low blood sugar, target ranges for A1C and blood sugars, signs/symptoms and treatment of hyper/hypoglycemia, monitoring blood sugars, taking medications as prescribed, benefits of exercising 30 minutes per day and prevention of complications of DM.  Goals 1. Follow Plate Method 2. Measure foods out. 3. Don't skip meals 4. No snacks between meals. 5.  Eat dinner before 7 pm. 6. Use exercise band and exercise 30-60 minutes 4 times per week. 7. Lose 1-2  Lbs  per week. 8. Keep food journal   Teaching Method Utilized:  Visual Auditory Hands on  Handouts given during visit include:  The Plate Method  Meal Plan Card  Diabetes Instructions.  Barriers to learning/adherence to lifestyle change:  None  Demonstrated degree of understanding via:  Teach Back   Monitoring/Evaluation:  Dietary intake, exercise,  Meal planning, SBG, and body weight in 1 month(s).

## 2015-09-17 ENCOUNTER — Ambulatory Visit (INDEPENDENT_AMBULATORY_CARE_PROVIDER_SITE_OTHER): Payer: 59

## 2015-09-17 DIAGNOSIS — E538 Deficiency of other specified B group vitamins: Secondary | ICD-10-CM | POA: Diagnosis not present

## 2015-09-17 MED ORDER — CYANOCOBALAMIN 1000 MCG/ML IJ SOLN
1000.0000 ug | Freq: Once | INTRAMUSCULAR | Status: AC
  Administered 2015-09-17: 1000 ug via INTRAMUSCULAR

## 2015-09-23 ENCOUNTER — Ambulatory Visit: Payer: 59 | Admitting: Nutrition

## 2015-09-30 ENCOUNTER — Ambulatory Visit: Payer: 59 | Admitting: Nutrition

## 2015-10-14 ENCOUNTER — Telehealth: Payer: Self-pay | Admitting: Emergency Medicine

## 2015-10-14 ENCOUNTER — Ambulatory Visit (INDEPENDENT_AMBULATORY_CARE_PROVIDER_SITE_OTHER): Payer: 59 | Admitting: Internal Medicine

## 2015-10-14 ENCOUNTER — Emergency Department (HOSPITAL_COMMUNITY)
Admission: EM | Admit: 2015-10-14 | Discharge: 2015-10-14 | Disposition: A | Discharge status: Home / Self Care | Payer: 59 | Attending: Emergency Medicine | Admitting: Emergency Medicine

## 2015-10-14 ENCOUNTER — Emergency Department (HOSPITAL_COMMUNITY): Payer: 59

## 2015-10-14 ENCOUNTER — Encounter: Payer: Self-pay | Admitting: Internal Medicine

## 2015-10-14 ENCOUNTER — Encounter (HOSPITAL_COMMUNITY): Payer: Self-pay | Admitting: Emergency Medicine

## 2015-10-14 ENCOUNTER — Other Ambulatory Visit (INDEPENDENT_AMBULATORY_CARE_PROVIDER_SITE_OTHER): Payer: 59

## 2015-10-14 VITALS — BP 142/82 | HR 85 | Temp 97.5°F | Resp 16 | Wt 331.0 lb

## 2015-10-14 DIAGNOSIS — J45909 Unspecified asthma, uncomplicated: Secondary | ICD-10-CM | POA: Insufficient documentation

## 2015-10-14 DIAGNOSIS — F17210 Nicotine dependence, cigarettes, uncomplicated: Secondary | ICD-10-CM | POA: Diagnosis not present

## 2015-10-14 DIAGNOSIS — R1013 Epigastric pain: Secondary | ICD-10-CM

## 2015-10-14 DIAGNOSIS — R0602 Shortness of breath: Secondary | ICD-10-CM | POA: Insufficient documentation

## 2015-10-14 DIAGNOSIS — R51 Headache: Secondary | ICD-10-CM | POA: Diagnosis not present

## 2015-10-14 DIAGNOSIS — F329 Major depressive disorder, single episode, unspecified: Secondary | ICD-10-CM | POA: Diagnosis not present

## 2015-10-14 DIAGNOSIS — R7302 Impaired glucose tolerance (oral): Secondary | ICD-10-CM

## 2015-10-14 DIAGNOSIS — K30 Functional dyspepsia: Secondary | ICD-10-CM

## 2015-10-14 DIAGNOSIS — K219 Gastro-esophageal reflux disease without esophagitis: Secondary | ICD-10-CM

## 2015-10-14 DIAGNOSIS — Z79899 Other long term (current) drug therapy: Secondary | ICD-10-CM | POA: Diagnosis not present

## 2015-10-14 DIAGNOSIS — K429 Umbilical hernia without obstruction or gangrene: Secondary | ICD-10-CM | POA: Insufficient documentation

## 2015-10-14 LAB — COMPREHENSIVE METABOLIC PANEL
ALT: 13 U/L (ref 0–35)
ALT: 15 U/L (ref 14–54)
AST: 6 U/L (ref 0–37)
AST: 10 U/L — ABNORMAL LOW (ref 15–41)
Albumin: 3.6 g/dL (ref 3.5–5.2)
Albumin: 3.5 g/dL (ref 3.5–5.0)
Alkaline Phosphatase: 68 U/L (ref 39–117)
Alkaline Phosphatase: 68 U/L (ref 38–126)
Anion gap: 7 (ref 5–15)
BUN: 11 mg/dL (ref 6–23)
BUN: 12 mg/dL (ref 6–20)
CO2: 27 mEq/L (ref 19–32)
CO2: 25 mmol/L (ref 22–32)
Calcium: 8.4 mg/dL (ref 8.4–10.5)
Calcium: 8.1 mg/dL — ABNORMAL LOW (ref 8.9–10.3)
Chloride: 107 mEq/L (ref 96–112)
Chloride: 106 mmol/L (ref 101–111)
Creatinine, Ser: 0.81 mg/dL (ref 0.40–1.20)
Creatinine, Ser: 0.79 mg/dL (ref 0.44–1.00)
GFR calc Af Amer: >60 mL/min (ref >60)
GFR calc non Af Amer: >60 mL/min (ref >60)
GFR: 81.73 mL/min (ref >60.00)
Glucose, Bld: 123 mg/dL — ABNORMAL HIGH (ref 70–99)
Glucose, Bld: 119 mg/dL — ABNORMAL HIGH (ref 65–99)
Potassium: 3.7 mEq/L (ref 3.5–5.1)
Potassium: 3.7 mmol/L (ref 3.5–5.1)
Sodium: 138 mEq/L (ref 135–145)
Sodium: 138 mmol/L (ref 135–145)
Total Bilirubin: 0.3 mg/dL (ref 0.2–1.2)
Total Bilirubin: 0.3 mg/dL (ref 0.3–1.2)
Total Protein: 6.8 g/dL (ref 6.0–8.3)
Total Protein: 6.7 g/dL (ref 6.5–8.1)

## 2015-10-14 LAB — URINALYSIS, ROUTINE W REFLEX MICROSCOPIC
Bilirubin Urine: NEGATIVE
Bilirubin Urine: NEGATIVE
Glucose, UA: NEGATIVE mg/dL
Hgb urine dipstick: NEGATIVE
Hgb urine dipstick: NEGATIVE
Ketones, ur: NEGATIVE
Ketones, ur: NEGATIVE mg/dL
Leukocytes, UA: NEGATIVE
Leukocytes, UA: NEGATIVE
Nitrite: NEGATIVE
Nitrite: NEGATIVE
Protein, ur: NEGATIVE mg/dL
RBC / HPF: NONE SEEN (ref 0–?)
Specific Gravity, Urine: 1.015 (ref 1.000–1.030)
Specific Gravity, Urine: 1.02 (ref 1.005–1.030)
Total Protein, Urine: NEGATIVE
Urine Glucose: NEGATIVE
Urobilinogen, UA: 0.2 (ref 0.0–1.0)
pH: 7 (ref 5.0–8.0)
pH: 6 (ref 5.0–8.0)

## 2015-10-14 LAB — CBC
HCT: 35.1 % — ABNORMAL LOW (ref 36.0–46.0)
HCT: 35.6 % — ABNORMAL LOW (ref 36.0–46.0)
Hemoglobin: 11.8 g/dL — ABNORMAL LOW (ref 12.0–15.0)
Hemoglobin: 11.6 g/dL — ABNORMAL LOW (ref 12.0–15.0)
MCH: 30.4 pg (ref 26.0–34.0)
MCHC: 33.6 g/dL (ref 30.0–36.0)
MCHC: 32.6 g/dL (ref 30.0–36.0)
MCV: 89.4 fl (ref 78.0–100.0)
MCV: 93.2 fL (ref 78.0–100.0)
Platelets: 315 10*3/uL (ref 150.0–400.0)
Platelets: 291 10*3/uL (ref 150–400)
RBC: 3.92 Mil/uL (ref 3.87–5.11)
RBC: 3.82 MIL/uL — ABNORMAL LOW (ref 3.87–5.11)
RDW: 13.9 % (ref 11.5–15.5)
RDW: 13.7 % (ref 11.5–15.5)
WBC: 9.7 10*3/uL (ref 4.0–10.5)
WBC: 11.7 10*3/uL — ABNORMAL HIGH (ref 4.0–10.5)

## 2015-10-14 LAB — LIPASE, BLOOD: Lipase: 25 U/L (ref 11–51)

## 2015-10-14 LAB — HEMOGLOBIN A1C: Hgb A1c MFr Bld: 5.9 % (ref 4.6–6.5)

## 2015-10-14 LAB — LIPASE: Lipase: 17 U/L (ref 11.0–59.0)

## 2015-10-14 MED ORDER — PROMETHAZINE HCL 25 MG/ML IJ SOLN
12.5000 mg | Freq: Once | INTRAMUSCULAR | Status: AC
  Administered 2015-10-14: 12.5 mg via INTRAVENOUS
  Filled 2015-10-14: qty 1

## 2015-10-14 MED ORDER — ONDANSETRON HCL 4 MG PO TABS: 4.0000 mg | ORAL_TABLET | Freq: Three times a day (TID) | ORAL | Status: DC | PRN

## 2015-10-14 MED ORDER — HYDROCODONE-ACETAMINOPHEN 5-325 MG PO TABS: 1.0000 | ORAL_TABLET | Freq: Four times a day (QID) | ORAL | Status: DC | PRN

## 2015-10-14 MED ORDER — PANTOPRAZOLE SODIUM 40 MG PO TBEC: 40.0000 mg | DELAYED_RELEASE_TABLET | Freq: Every day | ORAL | Status: DC

## 2015-10-14 MED ORDER — PROMETHAZINE HCL 25 MG PO TABS: 25.0000 mg | ORAL_TABLET | Freq: Four times a day (QID) | ORAL | Status: DC | PRN

## 2015-10-14 MED ORDER — HYDROMORPHONE HCL 1 MG/ML IJ SOLN
1.0000 mg | Freq: Once | INTRAMUSCULAR | Status: AC
  Administered 2015-10-14: 1 mg via INTRAVENOUS
  Filled 2015-10-14: qty 1

## 2015-10-14 MED ORDER — SODIUM CHLORIDE 0.9 % IV SOLN
INTRAVENOUS | Status: DC
  Administered 2015-10-14: 22:00:00 via INTRAVENOUS

## 2015-10-14 MED ORDER — IOPAMIDOL (ISOVUE-300) INJECTION 61%
125.0000 mL | Freq: Once | INTRAVENOUS | Status: AC | PRN
  Administered 2015-10-14: 125 mL via INTRAVENOUS

## 2015-10-14 MED ORDER — SODIUM CHLORIDE 0.9 % IV BOLUS (SEPSIS)
500.0000 mL | Freq: Once | INTRAVENOUS | Status: AC
  Administered 2015-10-14: 500 mL via INTRAVENOUS

## 2015-10-14 MED ORDER — ONDANSETRON HCL 4 MG/2ML IJ SOLN
4.0000 mg | Freq: Once | INTRAMUSCULAR | Status: AC | PRN
  Administered 2015-10-14: 4 mg via INTRAVENOUS
  Filled 2015-10-14: qty 2

## 2015-10-14 NOTE — ED Notes (Addendum)
Patient complaining of abdominal pain x 3 weeks and vomiting x 3 days. States "I went to Dr Quay Burow today and they took blood. They were supposed to call me by 5:00 with the results but they didn't." States "I have a knot right in the middle of my stomach but it hurts all over."

## 2015-10-14 NOTE — Discharge Instructions (Signed)
Follow-up with general surgery for the umbilical hernia. For consideration for repair. Take the pain medicine as directed. Take the antinausea medicine Phenergan as directed. Make an appointment to follow-up with your regular doctor as well. Return for any new or worse symptoms.

## 2015-10-14 NOTE — ED Provider Notes (Signed)
CSN: [id number]     Arrival date & time 10/14/15  1827 History  By signing my name below, I, Higinio Plan, attest that this documentation has been prepared under the direction and in the presence of Fredia Sorrow, MD . Electronically Signed: Higinio Plan, Scribe. 10/14/2015. 8:39 PM.   Chief Complaint  Patient presents with  . Abdominal Pain  . Emesis   The history is provided by the patient. No language interpreter was used.   HPI Comments: [name] is a 45 y.o. female with PMHx of GERD and delayed gastric emptying, who presents to the Emergency Department complaining of gradually worsening, 8/10, epigastric abdominal pain that is "shooting" towards her LLQ abdominal pain that began 2 days ago. She states she has a knot on the left side of her abdomen that appeared ~ 2 weeks ago. She notes associated nausea and 1 episode of vomiting today; however, her vomiting initially began 3 days ago. She also notes associated fever, chills, and headache. She states she saw her PCP earlier today in which she was prescribed Zofran and Protonix; though, she reports the Zofran has not helped her at all. She denies any PSHx to her stomach.   Past Medical History  Diagnosis Date  . POLYCYSTIC OVARIAN DISEASE   . OBESITY   . SMOKER   . OBSTRUCTIVE SLEEP APNEA   . DELAYED GASTRIC EMPTYING   . DEPRESSION   . GERD   . Anxiety   . ASTHMA   . Vitamin D deficiency 03/2015 dx  . Vitamin B12 deficiency 03/2015 dx    start IM replacement q 30d   Past Surgical History  Procedure Laterality Date  . Cocxyl removal  1995  . Dilation and curettage of uterus    . Tubal ligation  1998  . Cesarean section  97 & 98    x's 2  . Laparoscopy     Family History  Problem Relation Age of Onset  . Breast cancer Mother     dx 11/2009  . Diabetes Maternal Aunt   . Prostate cancer Maternal Grandfather 65    also hx colon polyps  . Heart disease Paternal Grandmother   . Pancreatic cancer Paternal Grandmother 27    Social History  Substance Use Topics  . Smoking status: Current Some Day Smoker -- 6.00 packs/day    Types: Cigarettes    Start date: 10/23/1987  . Smokeless tobacco: Never Used  . Alcohol Use: 0.0 oz/week    0 Standard drinks or equivalent per week     Comment: Rarely   OB History    No data available     Review of Systems  Constitutional: Positive for fever and chills.  HENT: Negative for rhinorrhea and sore throat.   Eyes: Positive for visual disturbance.  Respiratory: Positive for cough and shortness of breath.   Cardiovascular: Positive for chest pain. Negative for leg swelling.  Gastrointestinal: Positive for nausea, vomiting and abdominal pain. Negative for diarrhea.  Genitourinary: Negative for difficulty urinating.  Musculoskeletal: Negative for back pain.  Skin: Negative for rash.  Neurological: Positive for headaches.   Allergies  Review of patient's allergies indicates no known allergies.  Home Medications   Prior to Admission medications   Medication Sig Start Date End Date Taking? Authorizing Provider  acyclovir ointment (ZOVIRAX) 5 % Apply 1 application topically every 3 (three) hours. 07/16/15  Yes Binnie Rail, MD  calcium carbonate (TUMS - DOSED IN MG ELEMENTAL CALCIUM) 500 MG chewable tablet Chew  2 tablets by mouth daily.     Yes Historical Provider, MD  Cholecalciferol (VITAMIN D) 2000 units tablet Take 2,000 Units by mouth daily.   Yes Historical Provider, MD  cyanocobalamin (,VITAMIN B-12,) 1000 MCG/ML injection Inject 1 mL (1,000 mcg total) into the muscle every 30 (thirty) days. 04/09/15  Yes Rowe Clack, MD  ondansetron (ZOFRAN) 4 MG tablet Take 1 tablet (4 mg total) by mouth every 8 (eight) hours as needed for nausea or vomiting. 10/14/15  Yes Hoyt Koch, MD  PARoxetine (PAXIL) 20 MG tablet Take 1 tablet (20 mg total) by mouth daily. 08/19/15  Yes Binnie Rail, MD  albuterol (PROAIR HFA) 108 (90 BASE) MCG/ACT inhaler Inhale 2 puffs  into the lungs every 4 (four) hours as needed. Patient not taking: Reported on 10/14/2015 01/02/14   Rowe Clack, MD  ALPRAZolam Duanne Moron) 0.5 MG tablet Take 1 tablet (0.5 mg total) by mouth every 8 (eight) hours as needed for sleep or anxiety. Patient not taking: Reported on 10/14/2015 04/09/15   Rowe Clack, MD  HYDROcodone-acetaminophen (NORCO/VICODIN) 5-325 MG tablet Take 1-2 tablets by mouth every 6 (six) hours as needed. 10/14/15   Fredia Sorrow, MD  pantoprazole (PROTONIX) 40 MG tablet Take 1 tablet (40 mg total) by mouth daily. 10/14/15   Hoyt Koch, MD  phentermine 37.5 MG capsule Take 1 capsule (37.5 mg total) by mouth every morning. Patient not taking: Reported on 10/14/2015 04/09/15   Rowe Clack, MD  promethazine (PHENERGAN) 25 MG tablet 1 BY MOUTH EVERY 4 HOURS AS NEEDED FOR NAUSEA Patient not taking: Reported on 10/14/2015 12/12/12   Rowe Clack, MD  promethazine (PHENERGAN) 25 MG tablet Take 1 tablet (25 mg total) by mouth every 6 (six) hours as needed. 10/14/15   Fredia Sorrow, MD  valACYclovir (VALTREX) 1000 MG tablet Take two tablets ( total 2000 mg) by mouth q12h x 1 day; Start: ASAP after symptom onset Patient not taking: Reported on 10/14/2015 07/28/15   Burnard Hawthorne, FNP   BP 106/57 mmHg  Pulse 83  Temp(Src) 98.7 F (37.1 C) (Oral)  Resp 16  Ht 5' 5"  (1.651 m)  Wt 149.687 kg  BMI 54.91 kg/m2  SpO2 97%  LMP 09/09/2015 Physical Exam  Constitutional: She is oriented to person, place, and time. She appears well-developed.  HENT:  Head: Normocephalic.  Eyes: Conjunctivae and EOM are normal. No scleral icterus.  Pupils normal, sclera clear  Neck: Neck supple. No thyromegaly present.  Cardiovascular: Normal rate and regular rhythm.  Exam reveals no gallop and no friction rub.   No murmur heard. Pulmonary/Chest: No stridor. She has no wheezes. She has no rales. She exhibits no tenderness.  Lungs clear bilaterally  Abdominal: Bowel sounds are  normal. She exhibits no distension. There is tenderness. There is no rebound.  LLQ tenderness  Musculoskeletal: Normal range of motion. She exhibits no edema.  No swelling in ankles  Lymphadenopathy:    She has no cervical adenopathy.  Neurological: She is oriented to person, place, and time. She exhibits normal muscle tone. Coordination normal.  Skin: No rash noted. No erythema.  Psychiatric: She has a normal mood and affect. Her behavior is normal.    ED Course  Procedures  DIAGNOSTIC STUDIES:  Oxygen Saturation is 99% on RA, normal by my interpretation.    COORDINATION OF CARE:  8:35 PM Discussed treatment plan, which includes CXR and CT abdomen with pt at bedside and pt agreed to plan.  Labs Review Labs Reviewed  COMPREHENSIVE METABOLIC PANEL - Abnormal; Notable for the following:    Glucose, Bld 119 (*)    Calcium 8.1 (*)    AST 10 (*)    All other components within normal limits  CBC - Abnormal; Notable for the following:    WBC 11.7 (*)    RBC 3.82 (*)    Hemoglobin 11.6 (*)    HCT 35.6 (*)    All other components within normal limits  URINALYSIS, ROUTINE W REFLEX MICROSCOPIC (NOT AT [facility])  LIPASE, BLOOD   Results for orders placed or performed during the hospital encounter of 10/14/15  Urinalysis, Routine w reflex microscopic  Result Value Ref Range   Color, Urine YELLOW YELLOW   APPearance CLEAR CLEAR   Specific Gravity, Urine 1.020 1.005 - 1.030   pH 6.0 5.0 - 8.0   Glucose, UA NEGATIVE NEGATIVE mg/dL   Hgb urine dipstick NEGATIVE NEGATIVE   Bilirubin Urine NEGATIVE NEGATIVE   Ketones, ur NEGATIVE NEGATIVE mg/dL   Protein, ur NEGATIVE NEGATIVE mg/dL   Nitrite NEGATIVE NEGATIVE   Leukocytes, UA NEGATIVE NEGATIVE  Lipase, blood  Result Value Ref Range   Lipase 25 11 - 51 U/L  Comprehensive metabolic panel  Result Value Ref Range   Sodium 138 135 - 145 mmol/L   Potassium 3.7 3.5 - 5.1 mmol/L   Chloride 106 101 - 111 mmol/L   CO2 25 22 - 32 mmol/L    Glucose, Bld 119 (H) 65 - 99 mg/dL   BUN 12 6 - 20 mg/dL   Creatinine, Ser 0.79 0.44 - 1.00 mg/dL   Calcium 8.1 (L) 8.9 - 10.3 mg/dL   Total Protein 6.7 6.5 - 8.1 g/dL   Albumin 3.5 3.5 - 5.0 g/dL   AST 10 (L) 15 - 41 U/L   ALT 15 14 - 54 U/L   Alkaline Phosphatase 68 38 - 126 U/L   Total Bilirubin 0.3 0.3 - 1.2 mg/dL   GFR calc non Af Amer >60 >60 mL/min   GFR calc Af Amer >60 >60 mL/min   Anion gap 7 5 - 15  CBC  Result Value Ref Range   WBC 11.7 (H) 4.0 - 10.5 K/uL   RBC 3.82 (L) 3.87 - 5.11 MIL/uL   Hemoglobin 11.6 (L) 12.0 - 15.0 g/dL   HCT 35.6 (L) 36.0 - 46.0 %   MCV 93.2 78.0 - 100.0 fL   MCH 30.4 26.0 - 34.0 pg   MCHC 32.6 30.0 - 36.0 g/dL   RDW 13.7 11.5 - 15.5 %   Platelets 291 150 - 400 K/uL   First pain is is  Imaging Review Dg Chest 2 View  10/14/2015  CLINICAL DATA:  Gradual worsening severe epigastric abdominal pain beginning 2 days ago. EXAM: CHEST  2 VIEW COMPARISON:  08/02/2010 FINDINGS: Heart size is normal. Mediastinal shadows are normal. Lungs are clear. No effusions. No bony abnormalities. IMPRESSION: No active cardiopulmonary disease. Electronically Signed   By: Nelson Chimes M.D.   On: 10/14/2015 21:20   Ct Abdomen Pelvis W Contrast  10/14/2015  CLINICAL DATA:  Abdominal pain for 2 days. Progressive pain, epigastric radiating to the left lower quadrant. Nausea. EXAM: CT ABDOMEN AND PELVIS WITH CONTRAST TECHNIQUE: Multidetector CT imaging of the abdomen and pelvis was performed using the standard protocol following bolus administration of intravenous contrast. CONTRAST:  120m ISOVUE-300 IOPAMIDOL (ISOVUE-300) INJECTION 61% COMPARISON:  CT 06/02/2005 FINDINGS: Lower chest: Linear atelectasis in the right middle lobe and  lingula. No consolidation. No pleural effusion. Liver: Enlarged measuring 24 cm cranial caudal with steatosis. No focal lesion. Hepatobiliary: Gallbladder is contracted, no calcified stone. No pericholecystic inflammation. Pancreas: No ductal  dilatation or inflammation. Spleen: Normal. Adrenal glands: Left adrenal nodule measures 2.6 x 2.7 cm, heterogeneous in density. Right adrenal gland is normal. Kidneys: Symmetric renal enhancement.  No hydronephrosis. Stomach/Bowel: Stomach is distended with ingested contrast. Small hiatal hernia. There are no dilated or thickened small bowel loops. Small volume of stool throughout the colon without colonic wall thickening. The appendix is not confidently identified, no pericecal or right lower quadrant inflammation. Vascular/Lymphatic: No retroperitoneal adenopathy. Abdominal aorta is normal in caliber. Reproductive: The uterus is normal in size. Left ovary is normal. Right ovary not well seen due to adjacent bowel loops. Bladder: Physiologically distended, no wall thickening. Other: There is a fat containing umbilical hernia. Hernia neck measures 1.5 cm. Hernia contains omental fat with mild fat stranding. No bowel involvement. No free air, free fluid, or intra-abdominal fluid collection. Musculoskeletal: There are no acute or suspicious osseous abnormalities. There is a 2 cm sclerotic focus in the left outer aspect of the sacrum that is unchanged from prior CT and consistent with benign bone island. IMPRESSION: 1. Umbilical hernia containing omental fat. Soft tissue strain stranding of the herniated fat can be seen with fat necrosis. No bowel involvement. 2. Heterogeneous 2.7 cm left adrenal nodule. This is not seen on comparison exam from 10 years prior. Recommend further evaluation with contrast-enhanced MR or unenhanced CT. 3. Hepatomegaly and mild steatosis. Electronically Signed   By: Jeb Levering M.D.   On: 10/14/2015 21:28   I have personally reviewed and evaluated these images and lab results as part of my medical decision-making.   EKG Interpretation None      MDM   Final diagnoses:  Umbilical hernia without obstruction and without gangrrene  be i      CT scan now shows evidence of  umbilical hernia perhaps with the some herniated omental fat with some fat necrosis. This may explain some of the pain. Patient's epigastric bowel pain may not be explained by that. No other acute findings on workup here today. Will refer to general surgery will treat with pain medicine antinausea medicine and have her continue her Protonix started by her primary care doctor today. Patient overall feels much better.   Fredia Sorrow, MD 10/14/15 [phone]

## 2015-10-14 NOTE — Progress Notes (Signed)
   Subjective:    Patient ID: [name], female    DOB: 06/17/1970, 45 y.o.   MRN: [id number]  HPI The patient is a 45 YO female coming in for acute visit for stomach pain. Does have history of GERD, delayed gastric emptying (unclear cause, glucose intolerance listed). She is an active smoker. Has not been to GI since 2013. Feels like knot in her stomach 2-3 weeks with eating. Also burning pain with eating. No weight change. No injury or overuse at the onset.   Review of Systems  Constitutional: Positive for appetite change. Negative for fever, chills, activity change, fatigue and unexpected weight change.  HENT: Negative.   Eyes: Negative.   Respiratory: Negative for cough, chest tightness and shortness of breath.   Cardiovascular: Negative for chest pain, palpitations and leg swelling.  Gastrointestinal: Positive for nausea, abdominal pain and abdominal distention. Negative for vomiting, diarrhea and constipation.  Musculoskeletal: Negative.       Objective:   Physical Exam  Constitutional: She appears well-developed and well-nourished.  Overweight  HENT:  Head: Normocephalic and atraumatic.  Eyes: EOM are normal.  Neck: Normal range of motion.  Cardiovascular: Normal rate and regular rhythm.   Pulmonary/Chest: Effort normal and breath sounds normal. No respiratory distress. She has no wheezes.  Abdominal: Soft. She exhibits distension. There is no tenderness. There is no rebound and no guarding.  Umbilical hernia present with cough, easily reducible.   Musculoskeletal: She exhibits no edema.  Neurological: Coordination normal.  Skin: Skin is warm and dry.   Filed Vitals:   10/14/15 1045  BP: 142/82  Pulse: 85  Temp: 97.5 F (36.4 C)  TempSrc: Oral  Resp: 16  Weight: 331 lb (150.141 kg)  SpO2: 98%      Assessment & Plan:

## 2015-10-14 NOTE — ED Notes (Signed)
Pt DCd with [facility]. Pt cautioned regarding narcotic pain meds/usage/dependence as well as filling Rx after she reports that she has no way to fill her meds tonight. She ambulates to exit erect with relaxed facial features with relief of pain per her report and her only complaint continues to be from her nausea

## 2015-10-14 NOTE — ED Notes (Signed)
Pt was seen earlier today at [facility] and given Rx for zofran and Protonix- She reports that her son wants all to know that her  'poop was like a cucumber, but is now like a tootsie roll" per pt report. She reports that she was told at the GI office earlier today that they would wait for her lab results before they would further evaluate and that she came here because she was in discomfort. Pt is morbidly obeses, speaks of weight gain in the last month and has BS x 4. She reports pain to her L upper quadrant and L lower quadrant to palpation

## 2015-10-14 NOTE — ED Notes (Signed)
Pt in CT.

## 2015-10-14 NOTE — Telephone Encounter (Signed)
Patient called and stated she never received her prescription at the pharmacy. Please resend them nd sure its the pharmacy in [place].

## 2015-10-14 NOTE — ED Notes (Signed)
Pt complains of nausea and last zofran use was at 1430.

## 2015-10-14 NOTE — Progress Notes (Signed)
Pre visit review using our clinic review tool, if applicable. No additional management support is needed unless otherwise documented below in the visit note. 

## 2015-10-14 NOTE — Patient Instructions (Signed)
We will check the blood and the urine today. We will get you in with the GI doctor as well.   We think that you have a small hernia which is the knot. I suspect that acid reflux is causing the burning and pain.   We have sent in zofran for nausea which is non-drowsy. You can take it up to 3 times per day for nausea.   We have sent in protonix which is a stomach medicine. Take 1 pill daily and we recommend to take it about 30 minutes before breakfast.   Gastroesophageal Reflux Disease, Adult Normally, food travels down the esophagus and stays in the stomach to be digested. However, when a person has gastroesophageal reflux disease (GERD), food and stomach acid move back up into the esophagus. When this happens, the esophagus becomes sore and inflamed. Over time, GERD can create small holes (ulcers) in the lining of the esophagus.  CAUSES This condition is caused by a problem with the muscle between the esophagus and the stomach (lower esophageal sphincter, or LES). Normally, the LES muscle closes after food passes through the esophagus to the stomach. When the LES is weakened or abnormal, it does not close properly, and that allows food and stomach acid to go back up into the esophagus. The LES can be weakened by certain dietary substances, medicines, and medical conditions, including:  Tobacco use.  Pregnancy.  Having a hiatal hernia.  Heavy alcohol use.  Certain foods and beverages, such as coffee, chocolate, onions, and peppermint. RISK FACTORS This condition is more likely to develop in:  People who have an increased body weight.  People who have connective tissue disorders.  People who use NSAID medicines. SYMPTOMS Symptoms of this condition include:  Heartburn.  Difficult or painful swallowing.  The feeling of having a lump in the throat.  Abitter taste in the mouth.  Bad breath.  Having a large amount of saliva.  Having an upset or bloated  stomach.  Belching.  Chest pain.  Shortness of breath or wheezing.  Ongoing (chronic) cough or a night-time cough.  Wearing away of tooth enamel.  Weight loss. Different conditions can cause chest pain. Make sure to see your health care provider if you experience chest pain. DIAGNOSIS Your health care provider will take a medical history and perform a physical exam. To determine if you have mild or severe GERD, your health care provider may also monitor how you respond to treatment. You may also have other tests, including:  An endoscopy toexamine your stomach and esophagus with a small camera.  A test thatmeasures the acidity level in your esophagus.  A test thatmeasures how much pressure is on your esophagus.  A barium swallow or modified barium swallow to show the shape, size, and functioning of your esophagus. TREATMENT The goal of treatment is to help relieve your symptoms and to prevent complications. Treatment for this condition may vary depending on how severe your symptoms are. Your health care provider may recommend:  Changes to your diet.  Medicine.  Surgery. HOME CARE INSTRUCTIONS Diet  Follow a diet as recommended by your health care provider. This may involve avoiding foods and drinks such as:  Coffee and tea (with or without caffeine).  Drinks that containalcohol.  Energy drinks and sports drinks.  Carbonated drinks or sodas.  Chocolate and cocoa.  Peppermint and mint flavorings.  Garlic and onions.  Horseradish.  Spicy and acidic foods, including peppers, chili powder, curry powder, vinegar, hot sauces, and  barbecue sauce.  Citrus fruit juices and citrus fruits, such as oranges, lemons, and limes.  Tomato-based foods, such as red sauce, chili, salsa, and pizza with red sauce.  Fried and fatty foods, such as donuts, french fries, potato chips, and high-fat dressings.  High-fat meats, such as hot dogs and fatty cuts of red and white  meats, such as rib eye steak, sausage, ham, and bacon.  High-fat dairy items, such as whole milk, butter, and cream cheese.  Eat small, frequent meals instead of large meals.  Avoid drinking large amounts of liquid with your meals.  Avoid eating meals during the 2-3 hours before bedtime.  Avoid lying down right after you eat.  Do not exercise right after you eat. General Instructions  Pay attention to any changes in your symptoms.  Take over-the-counter and prescription medicines only as told by your health care provider. Do not take aspirin, ibuprofen, or other NSAIDs unless your health care provider told you to do so.  Do not use any tobacco products, including cigarettes, chewing tobacco, and e-cigarettes. If you need help quitting, ask your health care provider.  Wear loose-fitting clothing. Do not wear anything tight around your waist that causes pressure on your abdomen.  Raise (elevate) the head of your bed 6 inches (15cm).  Try to reduce your stress, such as with yoga or meditation. If you need help reducing stress, ask your health care provider.  If you are overweight, reduce your weight to an amount that is healthy for you. Ask your health care provider for guidance about a safe weight loss goal.  Keep all follow-up visits as told by your health care provider. This is important. SEEK MEDICAL CARE IF:  You have new symptoms.  You have unexplained weight loss.  You have difficulty swallowing, or it hurts to swallow.  You have wheezing or a persistent cough.  Your symptoms do not improve with treatment.  You have a hoarse voice. SEEK IMMEDIATE MEDICAL CARE IF:  You have pain in your arms, neck, jaw, teeth, or back.  You feel sweaty, dizzy, or light-headed.  You have chest pain or shortness of breath.  You vomit and your vomit looks like blood or coffee grounds.  You faint.  Your stool is bloody or black.  You cannot swallow, drink, or eat.   This  information is not intended to replace advice given to you by your health care provider. Make sure you discuss any questions you have with your health care provider.   Document Released: 01/05/2005 Document Revised: 12/17/2014 Document Reviewed: 07/23/2014 Elsevier Interactive Patient Education [employer].

## 2015-10-14 NOTE — ED Notes (Signed)
Patient states that pain is at an 8 at this time. States that her nausea is far worse.

## 2015-10-15 MED ORDER — PANTOPRAZOLE SODIUM 40 MG PO TBEC: 40.0000 mg | DELAYED_RELEASE_TABLET | Freq: Every day | ORAL | Status: DC

## 2015-10-15 MED ORDER — ONDANSETRON HCL 4 MG PO TABS: 4.0000 mg | ORAL_TABLET | Freq: Three times a day (TID) | ORAL | Status: DC | PRN

## 2015-10-15 NOTE — Assessment & Plan Note (Signed)
Not taking anything. Sounds to be GERD pain and rx for protonix and zofran for the nausea. Also referral to GI for worsening symptoms.

## 2015-10-15 NOTE — Assessment & Plan Note (Signed)
Talked to her about the need for small meals.

## 2015-10-15 NOTE — Telephone Encounter (Signed)
Spoke with pt. Re sent rxs to [facility]

## 2015-10-15 NOTE — Assessment & Plan Note (Signed)
Recheck HgA1c, some history of gastric delay unclear etiology without further chart review.

## 2015-10-19 ENCOUNTER — Ambulatory Visit (INDEPENDENT_AMBULATORY_CARE_PROVIDER_SITE_OTHER): Payer: 59 | Admitting: Gastroenterology

## 2015-10-19 ENCOUNTER — Other Ambulatory Visit: Payer: 59

## 2015-10-19 ENCOUNTER — Encounter: Payer: Self-pay | Admitting: Gastroenterology

## 2015-10-19 VITALS — BP 116/76 | HR 100 | Ht 65.0 in | Wt 326.1 lb

## 2015-10-19 DIAGNOSIS — R1013 Epigastric pain: Secondary | ICD-10-CM

## 2015-10-19 DIAGNOSIS — R140 Abdominal distension (gaseous): Secondary | ICD-10-CM

## 2015-10-19 DIAGNOSIS — K219 Gastro-esophageal reflux disease without esophagitis: Secondary | ICD-10-CM

## 2015-10-19 DIAGNOSIS — K5900 Constipation, unspecified: Secondary | ICD-10-CM

## 2015-10-19 MED ORDER — PANTOPRAZOLE SODIUM 40 MG PO TBEC: 40.0000 mg | DELAYED_RELEASE_TABLET | Freq: Two times a day (BID) | ORAL | Status: DC

## 2015-10-19 MED ORDER — LINACLOTIDE 145 MCG PO CAPS: 145.0000 ug | ORAL_CAPSULE | Freq: Every day | ORAL | Status: DC

## 2015-10-19 MED ORDER — VSL#3 PO CAPS: 1.0000 | ORAL_CAPSULE | Freq: Every day | ORAL | Status: DC

## 2015-10-19 NOTE — Patient Instructions (Addendum)
You have been scheduled for a gastric emptying scan at [facility] on 10/27/2015 at 7:30am. Please arrive at least 15 minutes prior to your appointment for registration. Please make certain not to have anything to eat or drink after midnight the night before your test. Hold all stomach medications (ex: Zofran, phenergan, Reglan) 48 hours prior to your test. If you need to reschedule your appointment, please contact radiology scheduling at [phone]. _____________________________________________________________________ A gastric-emptying study measures how long it takes for food to move through your stomach. There are several ways to measure stomach emptying. In the most common test, you eat food that contains a small amount of radioactive material. A scanner that detects the movement of the radioactive material is placed over your abdomen to monitor the rate at which food leaves your stomach. This test normally takes about 4 hours to complete. _____________________________________________________________________   Purchase 1 bottle of mag Citrate and use today or tomorrow Go to the basement for labs today

## 2015-10-19 NOTE — Progress Notes (Signed)
[name]    [id number]    [birth year]  Primary Care Physician:Stacy Lorretta Harp, MD  Referring Physician: Hoyt Koch, MD [address] [place], [place] [id number]  Chief complaint:  Epigastric abdominal pain, constipation  HPI:  76 yr F with previously followed by Dr [name], last seen in December 2013 by [name] is here for evaluation of new onset abdominal pain. She has pain predominantly in epigastric region associated with nausea and intermittent vomiting. Patient had similar symptoms in the past when she had H.pylori infection and they improved after she was treated for H.pylori and is concerned if she has a recurrent infection. She has long standing h/o GERD and is currently on Protonix once daily with breakthrough heartburn. Denies dysphagia or odynophagia. She is constipated with irregular bowel movements, currently not on any laxatives. Phenrgan helps with nausea but makes her drowsy. She had delayed gastric emptying scan in 2006. Normal abdominal ultrasound and HIDA scan. CT abd & pelvis done in ER 10/14/15 when she presented with abdominal pain showed fat containing umbilical hernia. Colonoscopy 2010 unremarkable except for Left sided diverticulosis. EGD 2010 showed LA grade A esophagitis, otherwise normal.    Outpatient Encounter Prescriptions as of 10/19/2015  Medication Sig  . acyclovir ointment (ZOVIRAX) 5 % Apply 1 application topically every 3 (three) hours.  . calcium carbonate (TUMS - DOSED IN MG ELEMENTAL CALCIUM) 500 MG chewable tablet Chew 2 tablets by mouth daily.    . Cholecalciferol (VITAMIN D) 2000 units tablet Take 2,000 Units by mouth daily.  . cyanocobalamin (,VITAMIN B-12,) 1000 MCG/ML injection Inject 1 mL (1,000 mcg total) into the muscle every 30 (thirty) days.  [name] HYDROcodone-acetaminophen (NORCO/VICODIN) 5-325 MG tablet Take 1-2 tablets by mouth every 6 (six) hours as needed.  . ondansetron (ZOFRAN) 4 MG tablet Take 1 tablet (4 mg  total) by mouth every 8 (eight) hours as needed for nausea or vomiting.  . pantoprazole (PROTONIX) 40 MG tablet Take 1 tablet (40 mg total) by mouth 2 (two) times daily.  [name] PARoxetine (PAXIL) 20 MG tablet Take 1 tablet (20 mg total) by mouth daily.  . promethazine (PHENERGAN) 25 MG tablet Take 1 tablet (25 mg total) by mouth every 6 (six) hours as needed.  . valACYclovir (VALTREX) 1000 MG tablet Take two tablets ( total 2000 mg) by mouth q12h x 1 day; Start: ASAP after symptom onset  . [DISCONTINUED] pantoprazole (PROTONIX) 40 MG tablet Take 1 tablet (40 mg total) by mouth daily.  . [DISCONTINUED] promethazine (PHENERGAN) 25 MG tablet 1 BY MOUTH EVERY 4 HOURS AS NEEDED FOR NAUSEA  . albuterol (PROAIR HFA) 108 (90 BASE) MCG/ACT inhaler Inhale 2 puffs into the lungs every 4 (four) hours as needed. (Patient not taking: Reported on 10/14/2015)  . ALPRAZolam (XANAX) 0.5 MG tablet Take 1 tablet (0.5 mg total) by mouth every 8 (eight) hours as needed for sleep or anxiety. (Patient not taking: Reported on 10/14/2015)  . linaclotide (LINZESS) 145 MCG CAPS capsule Take 1 capsule (145 mcg total) by mouth daily before breakfast.  . phentermine 37.5 MG capsule Take 1 capsule (37.5 mg total) by mouth every morning. (Patient not taking: Reported on 10/14/2015)  . Probiotic Product (VSL#3) CAPS Take 1 capsule by mouth daily.   Facility-Administered Encounter Medications as of 10/19/2015  Medication  . cyanocobalamin ((VITAMIN B-12)) injection 1,000 mcg    Allergies as of 10/19/2015  . (No Known Allergies)  Past Medical History  Diagnosis Date  . POLYCYSTIC OVARIAN DISEASE   . OBESITY   . SMOKER   . OBSTRUCTIVE SLEEP APNEA   . DELAYED GASTRIC EMPTYING   . DEPRESSION   . GERD   . Anxiety   . ASTHMA   . Vitamin D deficiency 03/2015 dx  . Vitamin B12 deficiency 03/2015 dx    start IM replacement q 30d  . Umbilical hernia     Past Surgical History  Procedure Laterality Date  . Cocxyl removal  1995  .  Dilation and curettage of uterus    . Tubal ligation  1998  . Cesarean section  97 & 98    x's 2  . Laparoscopy      Family History  Problem Relation Age of Onset  . Breast cancer Mother     dx 11/2009  . Diabetes Maternal Aunt   . Prostate cancer Maternal Grandfather 70    also hx colon polyps  . Heart disease Paternal Grandmother   . Pancreatic cancer Paternal Grandmother 38    Social History   Social History  . Marital Status: Married    Spouse Name: N/A  . Number of Children: 2  . 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 Smoking status: Current Some Day Smoker -- 6.00 packs/day    Types: Cigarettes    Start date: 10/23/1987  . Smokeless tobacco: Never Used  . Alcohol Use: 0.0 oz/week    0 Standard drinks or equivalent per week     Comment: Rarely  . Drug Use: No  . Sexual Activity: Not on file   Other Topics Concern  . Not on file   Social History Narrative   Lives with husband & 2 kids from 1st marriage. (1st husband expired 1998-leukemia) work as Merchandiser, retail, but currently unemployed      Review of systems: Review of Systems  Constitutional: Negative for fever and chills.  HENT: Negative.   Eyes: Negative for blurred vision.  Respiratory: Negative for cough, shortness of breath and wheezing.   Cardiovascular: Negative for chest pain and palpitations.  Gastrointestinal: as per HPI Genitourinary: Negative for dysuria, urgency, frequency and hematuria.  Musculoskeletal: Negative for myalgias, back pain and joint pain.  Skin: Negative for itching and rash.  Neurological: Negative for dizziness, tremors, focal weakness, seizures and loss of consciousness.  Endo/Heme/Allergies: Negative for environmental allergies.  Psychiatric/Behavioral: Negative for depression, suicidal ideas and hallucinations.  All other systems reviewed and are negative.   Physical Exam: Filed Vitals:   10/19/15  0937  BP: 116/76  Pulse: 100   Gen:      No acute distress HEENT:  EOMI, sclera anicteric Neck:     No masses; no thyromegaly Lungs:    Clear to auscultation bilaterally; normal respiratory effort CV:         Regular rate and rhythm; no murmurs Abd:      + bowel sounds; soft, mild tenderness peri umbilical ; no palpable masses, no distension Ext:    No edema; adequate peripheral perfusion Skin:      Warm and dry; no rash Neuro: alert and oriented x 3 Psych: normal mood and affect  Data Reviewed: Reviewed chart in epic  Assessment and Plan/Recommendations: 59 yr F with h/o chronic GERD with c/o epigastric pain, nausea and constipation worse in past 2 weeks Peri umbilical abdominal pain likely fat  containing umbilical hernia with subcutaneous fat necrosis.  Will increase Protonix to 25m BID for 2 months to beteer control GERD symptoms Patient is concerned about recurrent H.pylori infection, will check H.pylori stool Ag Constipation: Start Linzess 1433m daily and titrate dose based on response Her symptoms could also be related to gastroparesis, she did have delayed gastric emptying study in 2006, will repeat it to confirm prior to starting pro motility agents given the significant side effects associated with medications Return in 2-3 months   K. VeDenzil Magnuson MD [phone]-Fri 8a-5p [phone] 5p, weekends, holidays  CC: CrHoyt Koch*

## 2015-10-21 ENCOUNTER — Ambulatory Visit: Payer: 59

## 2015-10-27 ENCOUNTER — Ambulatory Visit (HOSPITAL_COMMUNITY): Payer: 59

## 2015-10-28 ENCOUNTER — Ambulatory Visit (HOSPITAL_COMMUNITY)
Admission: RE | Admit: 2015-10-28 | Discharge: 2015-10-28 | Disposition: A | Discharge status: Home / Self Care | Payer: 59 | Source: Ambulatory Visit | Attending: Gastroenterology | Admitting: Gastroenterology

## 2015-10-28 DIAGNOSIS — R1013 Epigastric pain: Secondary | ICD-10-CM | POA: Insufficient documentation

## 2015-10-28 DIAGNOSIS — K5900 Constipation, unspecified: Secondary | ICD-10-CM | POA: Diagnosis not present

## 2015-10-28 DIAGNOSIS — R140 Abdominal distension (gaseous): Secondary | ICD-10-CM

## 2015-10-28 DIAGNOSIS — K219 Gastro-esophageal reflux disease without esophagitis: Secondary | ICD-10-CM | POA: Insufficient documentation

## 2015-10-28 MED ORDER — TECHNETIUM TC 99M SULFUR COLLOID
2.0000 | Freq: Once | INTRAVENOUS | Status: AC | PRN
  Administered 2015-10-28: 2 via INTRAVENOUS

## 2015-10-29 ENCOUNTER — Ambulatory Visit (INDEPENDENT_AMBULATORY_CARE_PROVIDER_SITE_OTHER): Payer: 59 | Admitting: *Deleted

## 2015-10-29 DIAGNOSIS — E538 Deficiency of other specified B group vitamins: Secondary | ICD-10-CM

## 2015-10-29 MED ORDER — CYANOCOBALAMIN 1000 MCG/ML IJ SOLN
1000.0000 ug | Freq: Once | INTRAMUSCULAR | Status: AC
  Administered 2015-10-29: 1000 ug via INTRAMUSCULAR

## 2015-12-02 ENCOUNTER — Ambulatory Visit: Payer: 59

## 2015-12-02 ENCOUNTER — Ambulatory Visit: Payer: 59 | Admitting: *Deleted

## 2015-12-02 ENCOUNTER — Ambulatory Visit (INDEPENDENT_AMBULATORY_CARE_PROVIDER_SITE_OTHER): Payer: 59 | Admitting: Internal Medicine

## 2015-12-02 ENCOUNTER — Encounter: Payer: Self-pay | Admitting: Internal Medicine

## 2015-12-02 DIAGNOSIS — D3502 Benign neoplasm of left adrenal gland: Secondary | ICD-10-CM

## 2015-12-02 DIAGNOSIS — D3500 Benign neoplasm of unspecified adrenal gland: Secondary | ICD-10-CM | POA: Insufficient documentation

## 2015-12-02 DIAGNOSIS — K429 Umbilical hernia without obstruction or gangrene: Secondary | ICD-10-CM

## 2015-12-02 DIAGNOSIS — K219 Gastro-esophageal reflux disease without esophagitis: Secondary | ICD-10-CM

## 2015-12-02 DIAGNOSIS — K439 Ventral hernia without obstruction or gangrene: Secondary | ICD-10-CM | POA: Insufficient documentation

## 2015-12-02 DIAGNOSIS — R110 Nausea: Secondary | ICD-10-CM | POA: Insufficient documentation

## 2015-12-02 DIAGNOSIS — E538 Deficiency of other specified B group vitamins: Secondary | ICD-10-CM

## 2015-12-02 MED ORDER — VSL#3 PO CAPS: 1.0000 | ORAL_CAPSULE | Freq: Every day | ORAL | 5 refills | Status: DC

## 2015-12-02 MED ORDER — CYANOCOBALAMIN 1000 MCG/ML IJ SOLN
1000.0000 ug | Freq: Once | INTRAMUSCULAR | Status: AC
  Administered 2015-12-02: 1000 ug via INTRAMUSCULAR

## 2015-12-02 NOTE — Assessment & Plan Note (Signed)
Causes some discomfort We'll try to hold off on surgery unless pain increases Hopefully weight loss will help

## 2015-12-02 NOTE — Progress Notes (Signed)
Subjective:    Patient ID: [name], female    DOB: 30-Oct-1970, 45 y.o.   MRN: [id number]  HPI The patient is here for follow up from the hospital.   She went to the emergency room 7/5 for abdominal pain. The pain was epigastric but radiated toward her left lower quadrant pain was progressively worsening. It was associated with nausea, vomiting, chills, fever and headache.  She was diagnosed with an umbilical hernia without obstruction or gangrene. This was confirmed by CT scan. She was referred to general surgery for treatment of the hernia. The epigastric pain was not explained by the hernia and she was advised to continue Protonix and she was started on earlier that day by Dr. Sharlet Salina.  A few days after discharge she did see gastroenterology. She does have a history of esophagitis on EGD in 2010.  Her Protonix was increased to twice daily. She was started on Linzess for constipation. Some of her symptoms were thought to possibly be related to gastroparesis. She has a follow-up scheduled with GI.  Umbilical hernia:  She has some tenderness from the hernia especially after eating or lifting.  She denies severe pain.    GERD, gastroparesis, esophagitis: She has nausea daily and it is not related to food.  She has shooting pain in he left groin.  She has some throbbing in the umbilical area.  She has epigastric fullness.  She denies GERD symptoms with the protonix twice daily.  She is taking the Linzess daily and has been having diarrhea. .  She does have a follow-up scheduled with GI.  Morbid obesity: She has gained significant weight since she was last here and she does not understand how that is possible. She eats a healthy option at [employer], health choice tv dinners.  She does not too many sweets or chips.  She drinks sweet tea and water. She is never counted her calories. She tends to eat fruit for snack. 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  She sweats excessively in the back of her neck.  She has episodes of difficulty swallowing/throat tightening  Left adrenal gland:  Seen on ct scan from 10/14/15 - it was not present 10 years ago.  A contrast enhanced MR or non-contrast CT is recommended.  She feels she has all these concerning symptoms and is unsure what they're coming from. She knows her weight is contributing to someone. 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  Medications and allergies reviewed with patient and updated if appropriate.  Patient Active Problem List   Diagnosis Date Noted  . Glucose intolerance (impaired glucose tolerance) 04/09/2015  . Morbid obesity ([place]) 04/09/2015  . Vitamin D deficiency   . Vitamin B12 deficiency   . Anxiety   . SMOKER 01/28/2009  . Depression 01/28/2009  . GERD 01/28/2009  . POLYCYSTIC OVARIAN DISEASE 02/10/2007  . Obstructive sleep apnea 02/10/2007  . Asthma 02/10/2007  . Delayed gastric emptying 02/10/2007    Current Outpatient Prescriptions on File Prior to Visit  Medication Sig Dispense Refill  . acyclovir ointment (ZOVIRAX) 5 % Apply 1 application topically every 3 (three) hours. 30 g 5  . albuterol (PROAIR HFA) 108 (90 BASE) MCG/ACT inhaler Inhale 2 puffs into the lungs every 4 (four) hours as needed. 18 g 2  . ALPRAZolam (XANAX) 0.5 MG tablet  Take 1 tablet (0.5 mg total) by mouth every 8 (eight) hours as needed for sleep or anxiety. 30 tablet 1  . calcium carbonate (TUMS - DOSED IN MG ELEMENTAL CALCIUM) 500 MG chewable tablet Chew 2 tablets by mouth daily.      . Cholecalciferol (VITAMIN D) 2000 units tablet Take 2,000 Units by mouth daily.    . cyanocobalamin (,VITAMIN B-12,) 1000 MCG/ML injection Inject 1 mL (1,000 mcg total) into the muscle every 30 (thirty) days. 1 mL 0  . HYDROcodone-acetaminophen (NORCO/VICODIN) 5-325 MG tablet Take 1-2 tablets by mouth every 6 (six)  hours as needed. 10 tablet 0  . linaclotide (LINZESS) 145 MCG CAPS capsule Take 1 capsule (145 mcg total) by mouth daily before breakfast. 30 capsule 6  . ondansetron (ZOFRAN) 4 MG tablet Take 1 tablet (4 mg total) by mouth every 8 (eight) hours as needed for nausea or vomiting. 60 tablet 0  . pantoprazole (PROTONIX) 40 MG tablet Take 1 tablet (40 mg total) by mouth 2 (two) times daily. 60 tablet 6  . PARoxetine (PAXIL) 20 MG tablet Take 1 tablet (20 mg total) by mouth daily. 90 tablet 3  . phentermine 37.5 MG capsule Take 1 capsule (37.5 mg total) by mouth every morning. 30 capsule 0  . Probiotic Product (VSL#3) CAPS Take 1 capsule by mouth daily. 30 capsule 3  . promethazine (PHENERGAN) 25 MG tablet Take 1 tablet (25 mg total) by mouth every 6 (six) hours as needed. 12 tablet 1  . valACYclovir (VALTREX) 1000 MG tablet Take two tablets ( total 2000 mg) by mouth q12h x 1 day; Start: ASAP after symptom onset 6 tablet 2   Current Facility-Administered Medications on File Prior to Visit  Medication Dose Route Frequency Provider Last Rate Last Dose  . cyanocobalamin ((VITAMIN B-12)) injection 1,000 mcg  1,000 mcg Intramuscular Q30 days Binnie Rail, MD   1,000 mcg at 07/16/15 0830    Past Medical History:  Diagnosis Date  . Anxiety   . ASTHMA   . DELAYED GASTRIC EMPTYING   . DEPRESSION   . GERD   . OBESITY   . OBSTRUCTIVE SLEEP APNEA   . POLYCYSTIC OVARIAN DISEASE   . SMOKER   . Umbilical hernia   . Vitamin B12 deficiency 03/2015 dx   start IM replacement q 30d  . Vitamin D deficiency 03/2015 dx    Past Surgical History:  Procedure Laterality Date  . CESAREAN SECTION  97 & 98   x's 2  . Cocxyl removal  1995  . DILATION AND CURETTAGE OF UTERUS    . LAPAROSCOPY    . TUBAL LIGATION  1998    Social History   Social History  . Marital status: Married    Spouse name: N/A  . Number of children: 2  . Years of education: N/A   Occupational History  .   [employer]     CNA   Social History Main Topics  . Smoking status: Current Some Day Smoker    Packs/day: 6.00    Types: Cigarettes    Start date: 10/23/1987  . Smokeless tobacco: Never Used  . Alcohol use 0.0 oz/week     Comment: Rarely  . Drug use: No  . Sexual activity: Not Asked   Other Topics Concern  . None   Social History Narrative   Lives with husband & 2 kids from 1st marriage. (1st husband expired 1998-leukemia) work as Merchandiser, retail, but currently unemployed  Family History  Problem Relation Age of Onset  . Breast cancer Mother     dx 11/2009  . Diabetes Maternal Aunt   . Prostate cancer Maternal Grandfather 67    also hx colon polyps  . Heart disease Paternal Grandmother   . Pancreatic cancer Paternal Grandmother 37    Review of Systems  Constitutional: Positive for chills, diaphoresis and fever (low grade ).  HENT: Positive for trouble swallowing (sometimes).   Eyes: Negative for visual disturbance.  Respiratory: Positive for cough (from smoking), shortness of breath (with activity) and wheezing (occasional).   Cardiovascular: Positive for palpitations and leg swelling. Negative for chest pain.  Gastrointestinal: Positive for abdominal pain, diarrhea and nausea.  Genitourinary: Negative for dysuria and hematuria.  Musculoskeletal: Negative for arthralgias and back pain.  Neurological: Positive for dizziness, light-headedness, numbness (left arm and hand) and headaches. Negative for weakness.       Objective:   Vitals:   12/02/15 1310  BP: 136/84  Pulse: 97  Resp: 16  Temp: 98 F (36.7 C)   Filed Weights   12/02/15 1310  Weight: (!) 332 lb (150.6 kg)   Body mass index is 55.25 kg/m.   Physical Exam    Constitutional: Appears well-developed and well-nourished. No distress.  HENT:  Head: Normocephalic and atraumatic.  Neck: Neck supple. No tracheal deviation present. No thyromegaly present.  Cardiovascular: Normal rate, regular rhythm and normal  heart sounds.   No murmur heard. No carotid bruit  Pulmonary/Chest: Effort normal and breath sounds normal. No respiratory distress. No has no wheezes. No rales.  Abdomen: Obese, slight epigastric tenderness, slight umbilical tenderness, no other tenderness  Musculoskeletal: Trace edema.  Lymphadenopathy: No cervical adenopathy.  Skin: Skin is warm and dry. Not diaphoretic.  Psychiatric: Normal mood and affect. Behavior is normal.     Assessment & Plan:    See Problem List for Assessment and Plan of chronic medical problems.

## 2015-12-02 NOTE — Assessment & Plan Note (Signed)
We discussed weight loss at length She will start seeing the nutritionist again Stressed calorie counting-most likely she is consuming too many calories-reviewed caloric need to lose weight Last TSH in normal range-not metabolic We'll refer to endocrine for further evaluation of adrenal tumor and weight

## 2015-12-02 NOTE — Assessment & Plan Note (Signed)
CT scan ordered to evaluate further. We'll refer to endocrine for further evaluation if this is active or not

## 2015-12-02 NOTE — Assessment & Plan Note (Signed)
Chronic, unknown cause Following with GI Taking Zofran during the day and Phenergan at night if needed No improvement since going to the emergency room Will discuss with GI at her upcoming visit

## 2015-12-02 NOTE — Progress Notes (Signed)
Pre visit review using our clinic review tool, if applicable. No additional management support is needed unless otherwise documented below in the visit note. 

## 2015-12-02 NOTE — Assessment & Plan Note (Signed)
Currently controlled-improved with increasing Protonix to twice daily Has follow-up with GI Continue Protonix twice daily

## 2015-12-02 NOTE — Patient Instructions (Addendum)
  Test(s) ordered today. Your results will be released to [facility] (or called to you) after review, usually within 72hours after test completion. If any changes need to be made, you will be notified at that same time.  All other Health Maintenance issues reviewed.   All recommended immunizations and age-appropriate screenings are up-to-date or discussed.  No immunizations administered today.   Medications reviewed and updated.  No changes recommended at this time.  Your prescription(s) have been submitted to your pharmacy. Please take as directed and contact our office if you believe you are having problem(s) with the medication(s).  A referral for endocrine was ordered.   A ct scan was ordered for your adrenal tumor

## 2015-12-09 ENCOUNTER — Ambulatory Visit (INDEPENDENT_AMBULATORY_CARE_PROVIDER_SITE_OTHER)
Admission: RE | Admit: 2015-12-09 | Discharge: 2015-12-09 | Disposition: A | Discharge status: Home / Self Care | Payer: 59 | Source: Ambulatory Visit | Attending: Internal Medicine | Admitting: Internal Medicine

## 2015-12-09 DIAGNOSIS — D3502 Benign neoplasm of left adrenal gland: Secondary | ICD-10-CM

## 2015-12-11 ENCOUNTER — Encounter: Payer: Self-pay | Admitting: Internal Medicine

## 2016-01-04 ENCOUNTER — Encounter: Payer: Self-pay | Admitting: *Deleted

## 2016-01-04 ENCOUNTER — Emergency Department (INDEPENDENT_AMBULATORY_CARE_PROVIDER_SITE_OTHER): Payer: BLUE CROSS/BLUE SHIELD

## 2016-01-04 ENCOUNTER — Emergency Department (INDEPENDENT_AMBULATORY_CARE_PROVIDER_SITE_OTHER)
Admission: EM | Admit: 2016-01-04 | Discharge: 2016-01-04 | Disposition: A | Discharge status: Home / Self Care | Payer: BLUE CROSS/BLUE SHIELD | Source: Home / Self Care | Attending: Family Medicine | Admitting: Family Medicine

## 2016-01-04 DIAGNOSIS — M5441 Lumbago with sciatica, right side: Secondary | ICD-10-CM

## 2016-01-04 DIAGNOSIS — M533 Sacrococcygeal disorders, not elsewhere classified: Secondary | ICD-10-CM

## 2016-01-04 MED ORDER — CYCLOBENZAPRINE HCL 10 MG PO TABS: 10.0000 mg | ORAL_TABLET | Freq: Three times a day (TID) | ORAL | 1 refills | Status: DC

## 2016-01-04 MED ORDER — KETOROLAC TROMETHAMINE 60 MG/2ML IJ SOLN
60.0000 mg | Freq: Once | INTRAMUSCULAR | Status: AC
  Administered 2016-01-04: 60 mg via INTRAMUSCULAR

## 2016-01-04 MED ORDER — PREDNISONE 20 MG PO TABS: ORAL_TABLET | ORAL | 0 refills | Status: DC

## 2016-01-04 NOTE — ED Triage Notes (Signed)
Pt c/o RT side LBP x 6 days. She has taken flexeril and IBF with some relief.

## 2016-01-04 NOTE — ED Provider Notes (Signed)
[name]    CSN: [id number] Arrival date & time: 01/04/16  1907  First Provider Contact:  First MD Initiated Contact with Patient 01/04/16 1942        History   Chief Complaint Chief Complaint  Patient presents with  . Back Pain    HPI SAIA DEROSSETT is a 45 y.o. female.   Patient complains of spontaneous onset of right lower back pain 6 days ago.  She works as a Emergency planning/management officer but recalls no injury or change in physical activities.   She denies bowel or bladder dysfunction, and no saddle numbness.  She began taking Ibuprofen 810m and Flexeril, with improvement.  After working yesterday, her pain recurred, with intermittent sensation of numbness in her right buttock.   The history is provided by the patient.  Back Pain  Location:  Sacro-iliac joint and gluteal region Quality:  Aching Radiates to: right buttock. Pain severity:  Moderate Pain is:  Same all the time Onset quality:  Gradual Duration:  6 days Timing:  Constant Progression:  Worsening Chronicity:  New Context: not lifting heavy objects, not MVA and not recent injury   Relieved by:  Nothing Worsened by:  Movement Ineffective treatments:  NSAIDs and muscle relaxants Associated symptoms: paresthesias   Associated symptoms: no abdominal pain, no bladder incontinence, no bowel incontinence, no dysuria, no fever, no leg pain, no numbness, no pelvic pain, no perianal numbness, no weakness and no weight loss   Risk factors: obesity     Past Medical History:  Diagnosis Date  . Anxiety   . ASTHMA   . DELAYED GASTRIC EMPTYING   . DEPRESSION   . GERD   . OBESITY   . OBSTRUCTIVE SLEEP APNEA   . POLYCYSTIC OVARIAN DISEASE   . SMOKER   . Umbilical hernia   . Umbilical hernia   . Vitamin B12 deficiency 03/2015 dx   start IM replacement q 30d  . Vitamin D deficiency 03/2015 dx    Patient Active Problem List   Diagnosis Date Noted  . Nausea without vomiting 12/02/2015  . 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 Umbilical hernia 013/11/6576 . Glucose intolerance (impaired glucose tolerance) 04/09/2015  . Morbid obesity ([facility] 04/09/2015  . Vitamin D deficiency   . Vitamin B12 deficiency   . Anxiety   . SMOKER 01/28/2009  . Depression 01/28/2009  . GERD 01/28/2009  . POLYCYSTIC OVARIAN DISEASE 02/10/2007  . Obstructive sleep apnea 02/10/2007  . Asthma 02/10/2007    Past Surgical History:  Procedure Laterality Date  . CESAREAN SECTION  97 & 98   x's 2  . Cocxyl removal  1995  . DILATION AND CURETTAGE OF UTERUS    . LAPAROSCOPY    . TUBAL LIGATION  1998    OB History    No data available       Home Medications    Prior to Admission medications   Medication Sig Start Date End Date Taking? Authorizing Provider  acyclovir ointment (ZOVIRAX) 5 % Apply 1 application topically every 3 (three) hours. 07/16/15   SBinnie Rail MD  albuterol (PROAIR HFA) 108 (90 BASE) MCG/ACT inhaler Inhale 2 puffs into the lungs every 4 (four) hours as needed. 01/02/14   VRowe Clack MD  ALPRAZolam (Duanne Moron 0.5 MG tablet Take 1 tablet (0.5 mg total) by mouth every 8 (eight) hours as needed for sleep or anxiety. 04/09/15   VRowe Clack MD  calcium carbonate (TUMS -  DOSED IN MG ELEMENTAL CALCIUM) 500 MG chewable tablet Chew 2 tablets by mouth daily.      Historical Provider, MD  Cholecalciferol (VITAMIN D) 2000 units tablet Take 2,000 Units by mouth daily.    Historical Provider, MD  cyanocobalamin (,VITAMIN B-12,) 1000 MCG/ML injection Inject 1 mL (1,000 mcg total) into the muscle every 30 (thirty) days. 04/09/15   Rowe Clack, MD  cyclobenzaprine (FLEXERIL) 10 MG tablet Take 1 tablet (10 mg total) by mouth 3 (three) times daily. 01/04/16   Kandra Nicolas, MD  linaclotide [facility]) 145 MCG CAPS capsule Take 1 capsule (145 mcg total) by mouth daily before breakfast. 10/19/15   Mauri Pole, MD  pantoprazole (PROTONIX) 40 MG tablet Take 1 tablet (40 mg total) by  mouth 2 (two) times daily. 10/19/15   Mauri Pole, MD  PARoxetine (PAXIL) 20 MG tablet Take 1 tablet (20 mg total) by mouth daily. 08/19/15   Binnie Rail, MD  predniSONE (DELTASONE) 20 MG tablet Take one tab by mouth twice daily for 5 days, then one daily for 3 days. Take with food. 01/04/16   Kandra Nicolas, MD  Probiotic Product (VSL#3) CAPS Take 1 capsule by mouth daily. 12/02/15   Binnie Rail, MD  valACYclovir (VALTREX) 1000 MG tablet Take two tablets ( total 2000 mg) by mouth q12h x 1 day; Start: ASAP after symptom onset 07/28/15   Burnard Hawthorne, FNP    Family History Family History  Problem Relation Age of Onset  . Breast cancer Mother     dx 11/2009  . Diabetes Maternal Aunt   . Prostate cancer Maternal Grandfather 60    also hx colon polyps  . Heart disease Paternal Grandmother   . Pancreatic cancer Paternal Grandmother 66    Social History Social History  Substance Use Topics  . Smoking status: Current Some Day Smoker    Packs/day: 6.00    Types: Cigarettes    Start date: 10/23/1987  . Smokeless tobacco: Never Used  . Alcohol use 0.0 oz/week     Comment: Rarely     Allergies   Review of patient's allergies indicates no known allergies.   Review of Systems Review of Systems  Constitutional: Negative for fever and weight loss.  Gastrointestinal: Negative for abdominal pain and bowel incontinence.  Genitourinary: Negative for bladder incontinence, dysuria and pelvic pain.  Musculoskeletal: Positive for back pain.  Neurological: Positive for paresthesias. Negative for weakness and numbness.  All other systems reviewed and are negative.    Physical Exam Triage Vital Signs ED Triage Vitals  Enc Vitals Group     BP 01/04/16 1947 137/89     Pulse Rate 01/04/16 1947 88     Resp 01/04/16 1947 18     Temp 01/04/16 1947 97.9 F (36.6 C)     Temp Source 01/04/16 1947 Oral     SpO2 01/04/16 1947 98 %     Weight 01/04/16 1948 (!) 333 lb (151 kg)      Height 01/04/16 1948 5' 5"  (1.651 m)     Head Circumference --      Peak Flow --      Pain Score 01/04/16 1951 10     Pain Loc --      Pain Edu? --      Excl. in [place]? --    No data found.   Updated Vital Signs BP 137/89 (BP Location: Left Arm)   Pulse 88   Temp 97.9  F (36.6 C) (Oral)   Resp 18   Ht 5' 5"  (1.651 m)   Wt (!) 333 lb (151 kg)   LMP 12/31/2015   SpO2 98%   BMI 55.41 kg/m   Visual Acuity Right Eye Distance:   Left Eye Distance:   Bilateral Distance:    Right Eye Near:   Left Eye Near:    Bilateral Near:     Physical Exam  Constitutional: She is oriented to person, place, and time. She appears well-developed and well-nourished. No distress.  HENT:  Head: Atraumatic.  Nose: Nose normal.  Mouth/Throat: Oropharynx is clear and moist.  Eyes: Conjunctivae are normal. Pupils are equal, round, and reactive to light.  Neck: Neck supple.  Cardiovascular: Normal heart sounds.   Pulmonary/Chest: Breath sounds normal.  Abdominal: Soft. There is no tenderness.  Musculoskeletal: She exhibits no edema.       Lumbar back: She exhibits decreased range of motion, tenderness and bony tenderness. She exhibits no swelling.       Back:  Back:  Decreased range of motion.  Can heel/toe walk and squat without difficulty.    Tenderness over the right Sacral area.  Straight leg raising test is negative.  Sitting knee extension test is negative.  Strength and sensation in the lower extremities is normal.  Patellar and achilles reflexes are normal   Lymphadenopathy:    She has no cervical adenopathy.  Neurological: She is alert and oriented to person, place, and time. She has normal reflexes.  Skin: Skin is warm and dry.  Nursing note and vitals reviewed.    UC Treatments / Results  Labs (all labs ordered are listed, but only abnormal results are displayed) Labs Reviewed - No data to display  EKG  EKG Interpretation None       Radiology Dg Lumbar Spine  Complete  Result Date: 01/04/2016 CLINICAL DATA:  Low back pain for 1 week, no injury. EXAM: LUMBAR SPINE - COMPLETE 4+ VIEW COMPARISON:  None. FINDINGS: There is no evidence of lumbar spine fracture. Alignment is normal. Intervertebral disc spaces are maintained. IMPRESSION: Negative. Electronically Signed   By: Abelardo Diesel M.D.   On: 01/04/2016 20:49    Procedures Procedures (including critical care time)  Medications Ordered in UC Medications  ketorolac (TORADOL) injection 60 mg (60 mg Intramuscular Given 01/04/16 2019)     Initial Impression / Assessment and Plan / UC Course  I have reviewed the triage vital signs and the nursing notes.  Pertinent labs & imaging results that were available during my care of the patient were reviewed by me and considered in my medical decision making (see chart for details).  Clinical Course  Also consider ?possible sacral radiculopathy Begin prednisone burst/taper, and Flexeril Begin prednisone Tuesday 01/05/16. Apply ice pack for 20 to 30 minutes, 3 to 4 times daily  Continue until pain decreases.  Begin back stretching exercises as tolerated. Avoid lifting. Followup with Dr. Aundria Mems or Dr. Lynne Leader ([facility]) in one week.       Final Clinical Impressions(s) / UC Diagnoses   Final diagnoses:  Sacroiliac joint dysfunction of right side    New Prescriptions New Prescriptions   CYCLOBENZAPRINE (FLEXERIL) 10 MG TABLET    Take 1 tablet (10 mg total) by mouth 3 (three) times daily.   PREDNISONE (DELTASONE) 20 MG TABLET    Take one tab by mouth twice daily for 5 days, then one daily for 3 days. Take with food.  Kandra Nicolas, MD 01/12/16 1131

## 2016-01-04 NOTE — Discharge Instructions (Signed)
Begin prednisone Tuesday 01/05/16. Apply ice pack for 20 to 30 minutes, 3 to 4 times daily  Continue until pain decreases.  Begin back stretching exercises as tolerated. Avoid lifting.

## 2016-01-06 ENCOUNTER — Ambulatory Visit (INDEPENDENT_AMBULATORY_CARE_PROVIDER_SITE_OTHER): Payer: BLUE CROSS/BLUE SHIELD

## 2016-01-06 ENCOUNTER — Encounter: Payer: Self-pay | Admitting: Endocrinology

## 2016-01-06 ENCOUNTER — Ambulatory Visit (INDEPENDENT_AMBULATORY_CARE_PROVIDER_SITE_OTHER): Payer: BLUE CROSS/BLUE SHIELD | Admitting: Endocrinology

## 2016-01-06 DIAGNOSIS — E538 Deficiency of other specified B group vitamins: Secondary | ICD-10-CM | POA: Diagnosis not present

## 2016-01-06 DIAGNOSIS — E279 Disorder of adrenal gland, unspecified: Secondary | ICD-10-CM

## 2016-01-06 DIAGNOSIS — E278 Other specified disorders of adrenal gland: Secondary | ICD-10-CM | POA: Insufficient documentation

## 2016-01-06 LAB — CORTISOL: Cortisol, Plasma: 4.2 ug/dL

## 2016-01-06 MED ORDER — CYANOCOBALAMIN 1000 MCG/ML IJ SOLN
1000.0000 ug | INTRAMUSCULAR | Status: DC
  Administered 2016-01-06: 1000 ug via INTRAMUSCULAR

## 2016-01-06 NOTE — Progress Notes (Signed)
Subjective:    Patient ID: [name], female    DOB: 05-Sep-1970, 45 y.o.   MRN: [id number]  HPI In mid-2017, pt was incidentally noted on CT to have a nodule in the left adrenal gland.  She has h/o PCO.  She has moderate terminal hair growth on the face, and assoc weight gain.  She takes prednisone for low-back pain.   Past Medical History:  Diagnosis Date  . Anxiety   . ASTHMA   . DELAYED GASTRIC EMPTYING   . DEPRESSION   . GERD   . OBESITY   . OBSTRUCTIVE SLEEP APNEA   . POLYCYSTIC OVARIAN DISEASE   . SMOKER   . Umbilical hernia   . Umbilical hernia   . Vitamin B12 deficiency 03/2015 dx   start IM replacement q 30d  . Vitamin D deficiency 03/2015 dx    Past Surgical History:  Procedure Laterality Date  . CESAREAN SECTION  97 & 98   x's 2  . Cocxyl removal  1995  . DILATION AND CURETTAGE OF UTERUS    . LAPAROSCOPY    . TUBAL LIGATION  1998    Social History   Social History  . Marital status: Married    Spouse name: N/A  . Number of children: 2  . Years of education: N/A   Occupational History  .   [employer]    CNA   Social History Main Topics  . Smoking status: Current Some Day Smoker    Packs/day: 6.00    Types: Cigarettes    Start date: 10/23/1987  . Smokeless tobacco: Never Used  . Alcohol use 0.0 oz/week     Comment: Rarely  . Drug use: No  . Sexual activity: Not on file   Other Topics Concern  . Not on file   Social History Narrative   Lives with husband & 2 kids from 1st marriage. (1st husband expired 1998-leukemia) work as Merchandiser, retail, but currently unemployed    Current Outpatient Prescriptions on File Prior to Visit  Medication Sig Dispense Refill  . acyclovir ointment (ZOVIRAX) 5 % Apply 1 application topically every 3 (three) hours. 30 g 5  . albuterol (PROAIR HFA) 108 (90 BASE) MCG/ACT inhaler Inhale 2 puffs into the lungs every 4 (four) hours as needed. 18 g 2  . ALPRAZolam (XANAX) 0.5 MG tablet Take 1  tablet (0.5 mg total) by mouth every 8 (eight) hours as needed for sleep or anxiety. 30 tablet 1  . calcium carbonate (TUMS - DOSED IN MG ELEMENTAL CALCIUM) 500 MG chewable tablet Chew 2 tablets by mouth daily.      . Cholecalciferol (VITAMIN D) 2000 units tablet Take 2,000 Units by mouth daily.    . cyanocobalamin (,VITAMIN B-12,) 1000 MCG/ML injection Inject 1 mL (1,000 mcg total) into the muscle every 30 (thirty) days. 1 mL 0  . cyclobenzaprine (FLEXERIL) 10 MG tablet Take 1 tablet (10 mg total) by mouth 3 (three) times daily. 20 tablet 1  . linaclotide (LINZESS) 145 MCG CAPS capsule Take 1 capsule (145 mcg total) by mouth daily before breakfast. 30 capsule 6  . pantoprazole (PROTONIX) 40 MG tablet Take 1 tablet (40 mg total) by mouth 2 (two) times daily. (Patient taking differently: Take 40 mg by mouth daily. ) 60 tablet 6  . PARoxetine (PAXIL) 20 MG tablet Take 1 tablet (20 mg total) by mouth daily. 90 tablet 3  . predniSONE (DELTASONE) 20 MG tablet Take one tab  by mouth twice daily for 5 days, then one daily for 3 days. Take with food. 13 tablet 0  . Probiotic Product (VSL#3) CAPS Take 1 capsule by mouth daily. 30 capsule 5  . valACYclovir (VALTREX) 1000 MG tablet Take two tablets ( total 2000 mg) by mouth q12h x 1 day; Start: ASAP after symptom onset (Patient not taking: Reported on 01/06/2016) 6 tablet 2   Current Facility-Administered Medications on File Prior to Visit  Medication Dose Route Frequency Provider Last Rate Last Dose  . cyanocobalamin ((VITAMIN B-12)) injection 1,000 mcg  1,000 mcg Intramuscular Q30 days Binnie Rail, MD   1,000 mcg at 07/16/15 0830  . cyanocobalamin ((VITAMIN B-12)) injection 1,000 mcg  1,000 mcg Intramuscular Q30 days Cassandria Anger, MD   1,000 mcg at 01/06/16 1548    No Known Allergies  Family History  Problem Relation Age of Onset  . Breast cancer Mother     dx 11/2009  . Diabetes Maternal Aunt   . Prostate cancer Maternal Grandfather 41     also hx colon polyps  . Heart disease Paternal Grandmother   . Pancreatic cancer Paternal Grandmother 96  . Adrenal disorder Neg Hx     BP 126/72   Pulse 86   Ht 5' 5"  (1.651 m)   Wt (!) 330 lb (149.7 kg)   LMP 12/31/2015   SpO2 97%   BMI 54.91 kg/m    Review of Systems denies blurry vision, polyuria, insomnia, hyperpigmentation, numbness, easy bruising, depression, rash on the abdomen, flushing, pallor, n/v, syncope, chest pain, fever, and rhinorrhea.  She has acne.  She has reg menses, leg cramps, sob, headache, and excessive diaphopresis.     Objective:   Physical Exam VS: see vs page GEN: no distress.  Morbid obesity HEAD: head: no deformity eyes: no periorbital swelling, no proptosis external nose and ears are normal mouth: no lesion seen NECK: supple, thyroid is not enlarged CHEST WALL: no deformity LUNGS: clear to auscultation CV: tachycardic rate, but reg rhythm, no murmur ABD: abdomen is soft, nontender.  no hepatosplenomegaly.  not distended.  no hernia MUSCULOSKELETAL: muscle bulk and strength are grossly normal.  no obvious joint swelling.  gait is normal and steady EXTEMITIES: no deformity.  no ulcer on the feet.  feet are of normal color and temp.  no edema PULSES: dorsalis pedis intact bilat.  no carotid bruit NEURO:  cn 2-12 grossly intact.   readily moves all 4's.  sensation is intact to touch on the feet.   SKIN:  Normal texture and temperature.  No rash or suspicious lesion is visible.  No striae.   NODES:  None palpable at the neck PSYCH: alert, well-oriented.  Does not appear anxious nor depressed.     CT: 2.5 x 2.6 cm left adrenal adenoma.  Lab Results  Component Value Date   TSH 2.30 02/18/2015   I have reviewed outside records, and summarized: Pt was eval for abd pain, and CT was done.  When adrenal adenoma was noted, she was ref here.      Assessment & Plan:

## 2016-01-06 NOTE — Patient Instructions (Addendum)
Please recheck the CT scan in 3 months.  you will receive a phone call, about a day and time for an appointment. blood tests are requested for you today.  We'll let you know about the results.   Also, we should check a 24-HR urine collection.  If these tests are normal, no further testing is needed.

## 2016-01-07 ENCOUNTER — Other Ambulatory Visit: Payer: Self-pay

## 2016-01-08 ENCOUNTER — Other Ambulatory Visit: Payer: Self-pay

## 2016-01-08 DIAGNOSIS — E278 Other specified disorders of adrenal gland: Secondary | ICD-10-CM

## 2016-01-08 DIAGNOSIS — E279 Disorder of adrenal gland, unspecified: Principal | ICD-10-CM

## 2016-01-11 LAB — ALDOSTERONE + RENIN ACTIVITY W/ RATIO
ALDO / PRA Ratio: 52.9 Ratio — ABNORMAL HIGH (ref 0.9–28.9)
Aldosterone: 9 ng/dL
PRA LC/MS/MS: 0.17 ng/mL/h — ABNORMAL LOW (ref 0.25–5.82)

## 2016-01-13 ENCOUNTER — Ambulatory Visit (INDEPENDENT_AMBULATORY_CARE_PROVIDER_SITE_OTHER): Payer: BLUE CROSS/BLUE SHIELD | Admitting: Family Medicine

## 2016-01-13 ENCOUNTER — Ambulatory Visit: Payer: 59 | Admitting: Internal Medicine

## 2016-01-13 ENCOUNTER — Encounter: Payer: Self-pay | Admitting: Family Medicine

## 2016-01-13 VITALS — BP 136/87 | HR 101 | Wt 333.0 lb

## 2016-01-13 DIAGNOSIS — S39012A Strain of muscle, fascia and tendon of lower back, initial encounter: Secondary | ICD-10-CM

## 2016-01-13 MED ORDER — IBUPROFEN 800 MG PO TABS: 800.0000 mg | ORAL_TABLET | Freq: Three times a day (TID) | ORAL | 2 refills | Status: DC | PRN

## 2016-01-13 MED ORDER — PANTOPRAZOLE SODIUM 40 MG PO TBEC: 40.0000 mg | DELAYED_RELEASE_TABLET | Freq: Every day | ORAL | 1 refills | Status: DC

## 2016-01-13 MED ORDER — CYCLOBENZAPRINE HCL 10 MG PO TABS: 10.0000 mg | ORAL_TABLET | Freq: Three times a day (TID) | ORAL | 1 refills | Status: DC

## 2016-01-13 NOTE — Progress Notes (Signed)
   Subjective:    I'm seeing this patient as a consultation for:  Dr Assunta Found  CC: Back pain  HPI: Patient is a 3 week history of right low back pain. She denies any injury radiating pain weakness. She does note some numbness and tingling into her right foot occasionally. She denies any bowel or bladder problems fevers chills nausea vomiting or diarrhea. She was seen in urgent care on September 25 where her lumbar x-ray was unremarkable and she was given prednisone and Flexeril. She has finished the prednisone and thinks the Flexeril is mildly helpful at bedtime. She notes that she's been using some old leftover prescription ibuprofen and would like a refill she finds this to be more helpful. Symptoms are improved but still moderate and interfering with activities and work.  Past medical history, Surgical history, Family history not pertinant except as noted below, Social history, Allergies, and medications have been entered into the medical record, reviewed, and no changes needed.   Review of Systems: No headache, visual changes, nausea, vomiting, diarrhea, constipation, dizziness, abdominal pain, skin rash, fevers, chills, night sweats, weight loss, swollen lymph nodes, body aches, joint swelling, muscle aches, chest pain, shortness of breath, mood changes, visual or auditory hallucinations.   Objective:    Vitals:   01/13/16 1330  BP: 136/87  Pulse: (!) 101   General: Well Developed, well nourished, and in no acute distress. Morbidly obese Neuro/Psych: Alert and oriented x3, extra-ocular muscles intact, able to move all 4 extremities, sensation grossly intact. Skin: Warm and dry, no rashes noted.  Respiratory: Not using accessory muscles, speaking in full sentences, trachea midline.  Cardiovascular: Pulses palpable, no extremity edema. Abdomen: Does not appear distended. MSK:  Lumbar spine: Nontender to spinal midline. Mildly tender to palpation right lumbar paraspinal muscle  group. Normal back motion. Lower extremity strength reflexes and sensation are equal and normal throughout. Normal gait.  X-ray lumbar spine dated 01/04/2016 reviewed  No results found for this or any previous visit (from the past 24 hour(s)). No results found.  Impression and Recommendations:    Assessment and Plan: 45 y.o. female with Myofascial lumbosacral strain. Treat with physical therapy TENS unit heating pad ibuprofen and Flexeril. Use Protonix for GI prophylaxis.. Recheck in one month    Discussed warning signs or symptoms. Please see discharge instructions. Patient expresses understanding.

## 2016-01-13 NOTE — Patient Instructions (Signed)
Thank you for coming in today. Come back or go to the emergency room if you notice new weakness new numbness problems walking or bowel or bladder problems.  TENS UNIT: This is helpful for muscle pain and spasm.   Search and Purchase a TENS 7000 2nd edition at www.tenspros.com. It should be less than $30.     TENS unit instructions: Do not shower or bathe with the unit on Turn the unit off before removing electrodes or batteries If the electrodes lose stickiness add a drop of water to the electrodes after they are disconnected from the unit and place on plastic sheet. If you continued to have difficulty, call the TENS unit company to purchase more electrodes. Do not apply lotion on the skin area prior to use. Make sure the skin is clean and dry as this will help prolong the life of the electrodes. After use, always check skin for unusual red areas, rash or other skin difficulties. If there are any skin problems, does not apply electrodes to the same area. Never remove the electrodes from the unit by pulling the wires. Do not use the TENS unit or electrodes other than as directed. Do not change electrode placement without consultating your therapist or physician. Keep 2 fingers with between each electrode. Wear time ratio is 2:1, on to off times.    For example on for 30 minutes off for 15 minutes and then on for 30 minutes off for 15 minutes

## 2016-01-15 LAB — CATECHOLAMINES, FRACTIONATED, URINE, 24 HOUR
Calculated Total (E+NE): 39 mcg/24 h (ref 26–121)
Creatinine, Urine mg/day-CATEUR: 1.75 g/(24.h) (ref 0.63–2.50)
Dopamine, 24 hr Urine: 224 mcg/24 h (ref 52–480)
Norepinephrine, 24 hr Ur: 39 mcg/24 h (ref 15–100)
Total Volume - CF 24Hr U: 1540 mL

## 2016-01-15 LAB — METANEPHRINES, URINE, 24 HOUR
Metaneph Total, Ur: 399 mcg/24 h (ref 182–739)
Metanephrines, Ur: 55 mcg/24 h — ABNORMAL LOW (ref 58–203)
Normetanephrine, 24H Ur: 344 mcg/24 h (ref 88–649)

## 2016-01-19 ENCOUNTER — Other Ambulatory Visit (INDEPENDENT_AMBULATORY_CARE_PROVIDER_SITE_OTHER): Payer: Self-pay | Admitting: Otolaryngology

## 2016-01-20 ENCOUNTER — Other Ambulatory Visit: Payer: BLUE CROSS/BLUE SHIELD

## 2016-01-21 ENCOUNTER — Ambulatory Visit: Payer: BLUE CROSS/BLUE SHIELD | Admitting: Physical Therapy

## 2016-01-22 ENCOUNTER — Ambulatory Visit: Payer: BLUE CROSS/BLUE SHIELD | Admitting: Internal Medicine

## 2016-01-22 NOTE — Progress Notes (Deleted)
Subjective:    Patient ID: [name], female    DOB: 1970-12-01, 45 y.o.   MRN: [id number]  HPI The patient is here for follow up.  GERD:  She is taking her medication daily as prescribed.  She denies any GERD symptoms and feels her GERD is well controlled.   Anxiety, depression: She is taking her medication daily as prescribed. She denies any side effects from the medication. She feels her anxiety and depression are well controlled and she is happy with her current dose of medication.      Medications and allergies reviewed with patient and updated if appropriate.  Patient Active Problem List   Diagnosis Date Noted  . Adrenal nodule ([place]) 01/06/2016  . Nausea without vomiting 12/02/2015  . 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 Umbilical hernia 50/35/4656  . Glucose intolerance (impaired glucose tolerance) 04/09/2015  . Morbid obesity ([place]) 04/09/2015  . Vitamin D deficiency   . Vitamin B12 deficiency   . Anxiety   . SMOKER 01/28/2009  . Depression 01/28/2009  . GERD 01/28/2009  . POLYCYSTIC OVARIAN DISEASE 02/10/2007  . Obstructive sleep apnea 02/10/2007  . Asthma 02/10/2007    Current Outpatient Prescriptions on File Prior to Visit  Medication Sig Dispense Refill  . acyclovir ointment (ZOVIRAX) 5 % Apply 1 application topically every 3 (three) hours. 30 g 5  . albuterol (PROAIR HFA) 108 (90 BASE) MCG/ACT inhaler Inhale 2 puffs into the lungs every 4 (four) hours as needed. 18 g 2  . ALPRAZolam (XANAX) 0.5 MG tablet Take 1 tablet (0.5 mg total) by mouth every 8 (eight) hours as needed for sleep or anxiety. 30 tablet 1  . calcium carbonate (TUMS - DOSED IN MG ELEMENTAL CALCIUM) 500 MG chewable tablet Chew 2 tablets by mouth daily.      . Cholecalciferol (VITAMIN D) 2000 units tablet Take 2,000 Units by mouth  daily.    . cyanocobalamin (,VITAMIN B-12,) 1000 MCG/ML injection Inject 1 mL (1,000 mcg total) into the muscle every 30 (thirty) days. 1 mL 0  . cyclobenzaprine (FLEXERIL) 10 MG tablet Take 1 tablet (10 mg total) by mouth 3 (three) times daily. 20 tablet 1  . ibuprofen (ADVIL,MOTRIN) 800 MG tablet Take 1 tablet (800 mg total) by mouth every 8 (eight) hours as needed. 60 tablet 2  . linaclotide (LINZESS) 145 MCG CAPS capsule Take 1 capsule (145 mcg total) by mouth daily before breakfast. 30 capsule 6  . OVER THE COUNTER MEDICATION Plexus Packets for drink  and pills    . pantoprazole (PROTONIX) 40 MG tablet Take 1 tablet (40 mg total) by mouth daily. 30 tablet 1  . PARoxetine (PAXIL) 20 MG tablet Take 1 tablet (20 mg total) by mouth daily. 90 tablet 3  . Probiotic Product (VSL#3) CAPS Take 1 capsule by mouth daily. 30 capsule 5  . valACYclovir (VALTREX) 1000 MG tablet Take two tablets ( total 2000 mg) by mouth q12h x 1 day; Start: ASAP after symptom onset (Patient not taking: Reported on 01/06/2016) 6 tablet 2   Current Facility-Administered Medications on File Prior to Visit  Medication Dose Route Frequency Provider Last Rate Last Dose  . cyanocobalamin ((VITAMIN B-12)) injection 1,000 mcg  1,000 mcg Intramuscular Q30 days Binnie Rail, MD   1,000 mcg at 07/16/15 0830  . cyanocobalamin ((VITAMIN B-12)) injection 1,000 mcg  1,000 mcg Intramuscular Q30 days Cassandria Anger, MD   1,000 mcg at 01/06/16 1548    Past  Medical History:  Diagnosis Date  . Anxiety   . ASTHMA   . DELAYED GASTRIC EMPTYING   . DEPRESSION   . GERD   . OBESITY   . OBSTRUCTIVE SLEEP APNEA   . POLYCYSTIC OVARIAN DISEASE   . SMOKER   . Umbilical hernia   . Umbilical hernia   . Vitamin B12 deficiency 03/2015 dx   start IM replacement q 30d  . Vitamin D deficiency 03/2015 dx    Past Surgical History:  Procedure Laterality Date  . CESAREAN SECTION  97 & 98   x's 2  . Cocxyl removal  1995  . DILATION AND  CURETTAGE OF UTERUS    . LAPAROSCOPY    . TUBAL LIGATION  1998    Social History   Social History  . Marital status: Married    Spouse name: N/A  . Number of children: 2  . Years of education: N/A   Occupational History  .   [employer]    CNA   Social History Main Topics  . Smoking status: Current Some Day Smoker    Packs/day: 6.00    Types: Cigarettes    Start date: 10/23/1987  . Smokeless tobacco: Never Used  . Alcohol use 0.0 oz/week     Comment: Rarely  . Drug use: No  . Sexual activity: Not on file   Other Topics Concern  . Not on file   Social History Narrative   Lives with husband & 2 kids from 1st marriage. (1st husband expired 1998-leukemia) work as Merchandiser, retail, but currently unemployed    Family History  Problem Relation Age of Onset  . Breast cancer Mother     dx 11/2009  . Diabetes Maternal Aunt   . Prostate cancer Maternal Grandfather 2    also hx colon polyps  . Heart disease Paternal Grandmother   . Pancreatic cancer Paternal Grandmother 82  . Adrenal disorder Neg Hx     Review of Systems     Objective:  There were no vitals filed for this visit. There were no vitals filed for this visit. There is no height or weight on file to calculate BMI.   Physical Exam    Constitutional: Appears well-developed and well-nourished. No distress.  HENT:  Head: Normocephalic and atraumatic.  Neck: Neck supple. No tracheal deviation present. No thyromegaly present.  No cervical lymphadenopathy Cardiovascular: Normal rate, regular rhythm and normal heart sounds.   No murmur heard. No carotid bruit .  No edema Pulmonary/Chest: Effort normal and breath sounds normal. No respiratory distress. No has no wheezes. No rales.  Skin: Skin is warm and dry. Not diaphoretic.  Psychiatric: Normal mood and affect. Behavior is normal.      Assessment & Plan:    See Problem List for Assessment and Plan of chronic medical problems.

## 2016-02-05 ENCOUNTER — Ambulatory Visit (INDEPENDENT_AMBULATORY_CARE_PROVIDER_SITE_OTHER): Payer: BLUE CROSS/BLUE SHIELD

## 2016-02-05 DIAGNOSIS — E538 Deficiency of other specified B group vitamins: Secondary | ICD-10-CM

## 2016-02-05 MED ORDER — CYANOCOBALAMIN 1000 MCG/ML IJ SOLN
1000.0000 ug | Freq: Once | INTRAMUSCULAR | Status: AC
  Administered 2016-02-05: 1000 ug via INTRAMUSCULAR

## 2016-02-07 NOTE — Progress Notes (Signed)
Injection given.   Binnie Rail, MD

## 2016-02-11 ENCOUNTER — Ambulatory Visit: Payer: BLUE CROSS/BLUE SHIELD | Admitting: Family Medicine

## 2016-03-08 ENCOUNTER — Emergency Department (HOSPITAL_COMMUNITY): Payer: BLUE CROSS/BLUE SHIELD

## 2016-03-08 ENCOUNTER — Emergency Department (HOSPITAL_COMMUNITY)
Admission: EM | Admit: 2016-03-08 | Discharge: 2016-03-09 | Disposition: A | Discharge status: Home / Self Care | Payer: BLUE CROSS/BLUE SHIELD | Attending: Emergency Medicine | Admitting: Emergency Medicine

## 2016-03-08 ENCOUNTER — Encounter (HOSPITAL_COMMUNITY): Payer: Self-pay | Admitting: Emergency Medicine

## 2016-03-08 DIAGNOSIS — J45909 Unspecified asthma, uncomplicated: Secondary | ICD-10-CM | POA: Diagnosis not present

## 2016-03-08 DIAGNOSIS — F17210 Nicotine dependence, cigarettes, uncomplicated: Secondary | ICD-10-CM | POA: Insufficient documentation

## 2016-03-08 DIAGNOSIS — R0789 Other chest pain: Secondary | ICD-10-CM | POA: Diagnosis present

## 2016-03-08 DIAGNOSIS — R079 Chest pain, unspecified: Secondary | ICD-10-CM

## 2016-03-08 DIAGNOSIS — I471 Supraventricular tachycardia: Secondary | ICD-10-CM | POA: Insufficient documentation

## 2016-03-08 LAB — BASIC METABOLIC PANEL
Anion gap: 11 (ref 5–15)
BUN: 17 mg/dL (ref 6–20)
CO2: 21 mmol/L — ABNORMAL LOW (ref 22–32)
Calcium: 9.2 mg/dL (ref 8.9–10.3)
Chloride: 104 mmol/L (ref 101–111)
Creatinine, Ser: 1 mg/dL (ref 0.44–1.00)
GFR calc Af Amer: >60 mL/min (ref >60)
GFR calc non Af Amer: >60 mL/min (ref >60)
Glucose, Bld: 120 mg/dL — ABNORMAL HIGH (ref 65–99)
Potassium: 3.6 mmol/L (ref 3.5–5.1)
Sodium: 136 mmol/L (ref 135–145)

## 2016-03-08 LAB — CBC
HCT: 41.1 % (ref 36.0–46.0)
Hemoglobin: 13.6 g/dL (ref 12.0–15.0)
MCH: 30.6 pg (ref 26.0–34.0)
MCHC: 33.1 g/dL (ref 30.0–36.0)
MCV: 92.6 fL (ref 78.0–100.0)
Platelets: 406 10*3/uL — ABNORMAL HIGH (ref 150–400)
RBC: 4.44 MIL/uL (ref 3.87–5.11)
RDW: 13.7 % (ref 11.5–15.5)
WBC: 15.2 10*3/uL — ABNORMAL HIGH (ref 4.0–10.5)

## 2016-03-08 LAB — I-STAT CHEM 8, ED
BUN: 22 mg/dL — ABNORMAL HIGH (ref 6–20)
Calcium, Ion: 1.06 mmol/L — ABNORMAL LOW (ref 1.15–1.40)
Chloride: 103 mmol/L (ref 101–111)
Creatinine, Ser: 1 mg/dL (ref 0.44–1.00)
Glucose, Bld: 120 mg/dL — ABNORMAL HIGH (ref 65–99)
HCT: 40 % (ref 36.0–46.0)
Hemoglobin: 13.6 g/dL (ref 12.0–15.0)
Potassium: 4.2 mmol/L (ref 3.5–5.1)
Sodium: 140 mmol/L (ref 135–145)
TCO2: 26 mmol/L (ref 0–100)

## 2016-03-08 LAB — I-STAT TROPONIN, ED: Troponin i, poc: 0 ng/mL (ref 0.00–0.08)

## 2016-03-08 MED ORDER — METOPROLOL TARTRATE 5 MG/5ML IV SOLN
5.0000 mg | Freq: Once | INTRAVENOUS | Status: AC
  Administered 2016-03-08: 5 mg via INTRAVENOUS
  Filled 2016-03-08: qty 5

## 2016-03-08 MED ORDER — ADENOSINE 6 MG/2ML IV SOLN
INTRAVENOUS | Status: AC
  Administered 2016-03-08: 6 mg
  Filled 2016-03-08: qty 2

## 2016-03-08 MED ORDER — LORAZEPAM 2 MG/ML IJ SOLN
0.5000 mg | Freq: Once | INTRAMUSCULAR | Status: AC
  Administered 2016-03-08: 0.5 mg via INTRAVENOUS
  Filled 2016-03-08: qty 1

## 2016-03-08 MED ORDER — SODIUM CHLORIDE 0.9 % IV BOLUS (SEPSIS)
500.0000 mL | Freq: Once | INTRAVENOUS | Status: AC
  Administered 2016-03-08: 500 mL via INTRAVENOUS

## 2016-03-08 MED ORDER — ADENOSINE 6 MG/2ML IV SOLN
INTRAVENOUS | Status: AC
  Filled 2016-03-08: qty 4

## 2016-03-08 MED ORDER — METOPROLOL TARTRATE 50 MG PO TABS: 50.0000 mg | ORAL_TABLET | Freq: Two times a day (BID) | ORAL | 0 refills | Status: DC

## 2016-03-08 NOTE — ED Provider Notes (Signed)
[facility] DEPT Provider Note   CSN: [id number] Arrival date & time: 03/08/16  2229     History   Chief Complaint Chief Complaint  Patient presents with  . Chest Pain    HPI [name] is a 45 y.o. female.  HPI  This is a 45 year old female who presents today with sudden onset of chest discomfort and palpitations. She has never had similar symptoms in the past. It began just prior to coming to the ED. She was driven in by private vehicle. She denies any history of hyperthyroidism, cocaine use, or extra caffeine. She is a smoker.   Past Medical History:  Diagnosis Date  . Anxiety   . ASTHMA   . DELAYED GASTRIC EMPTYING   . DEPRESSION   . GERD   . OBESITY   . OBSTRUCTIVE SLEEP APNEA   . POLYCYSTIC OVARIAN DISEASE   . SMOKER   . Umbilical hernia   . Umbilical hernia   . Vitamin B12 deficiency 03/2015 dx   start IM replacement q 30d  . Vitamin D deficiency 03/2015 dx    Patient Active Problem List   Diagnosis Date Noted  . Adrenal nodule ([place]) 01/06/2016  . Nausea without vomiting 12/02/2015  . 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 Umbilical hernia 26/20/3559  . Glucose intolerance (impaired glucose tolerance) 04/09/2015  . Morbid obesity ([place]) 04/09/2015  . Vitamin D deficiency   . Vitamin B12 deficiency   . Anxiety   . SMOKER 01/28/2009  . Depression 01/28/2009  . GERD 01/28/2009  . POLYCYSTIC OVARIAN DISEASE 02/10/2007  . Obstructive sleep apnea 02/10/2007  . Asthma 02/10/2007    Past Surgical History:  Procedure Laterality Date  . CESAREAN SECTION  97 & 98   x's 2  . Cocxyl removal  1995  . DILATION AND CURETTAGE OF UTERUS    . LAPAROSCOPY    . TUBAL LIGATION  1998    OB History    Gravida Para Term Preterm AB Living   3 2 2   1      SAB TAB Ectopic Multiple Live Births     1             Home Medications    Prior to Admission medications   Medication Sig Start Date End Date Taking? Authorizing Provider  acyclovir ointment  (ZOVIRAX) 5 % Apply 1 application topically every 3 (three) hours. 07/16/15   Binnie Rail, MD  albuterol (PROAIR HFA) 108 (90 BASE) MCG/ACT inhaler Inhale 2 puffs into the lungs every 4 (four) hours as needed. 01/02/14   Rowe Clack, MD  ALPRAZolam Duanne Moron) 0.5 MG tablet Take 1 tablet (0.5 mg total) by mouth every 8 (eight) hours as needed for sleep or anxiety. 04/09/15   Rowe Clack, MD  calcium carbonate (TUMS - DOSED IN MG ELEMENTAL CALCIUM) 500 MG chewable tablet Chew 2 tablets by mouth daily.      Historical Provider, MD  Cholecalciferol (VITAMIN D) 2000 units tablet Take 2,000 Units by mouth daily.    Historical Provider, MD  cyanocobalamin (,VITAMIN B-12,) 1000 MCG/ML injection Inject 1 mL (1,000 mcg total) into the muscle every 30 (thirty) days. 04/09/15   Rowe Clack, MD  cyclobenzaprine (FLEXERIL) 10 MG tablet Take 1 tablet (10 mg total) by mouth 3 (three) times daily. 01/13/16   Gregor Hams, MD  ibuprofen (ADVIL,MOTRIN) 800 MG tablet Take 1 tablet (800 mg total) by mouth every 8 (eight) hours as needed. 01/13/16  Gregor Hams, MD  linaclotide [facility]) 145 MCG CAPS capsule Take 1 capsule (145 mcg total) by mouth daily before breakfast. 10/19/15   Mauri Pole, MD  OVER THE COUNTER MEDICATION Plexus Packets for drink  and pills    Historical Provider, MD  pantoprazole (PROTONIX) 40 MG tablet Take 1 tablet (40 mg total) by mouth daily. 01/13/16   Gregor Hams, MD  PARoxetine (PAXIL) 20 MG tablet Take 1 tablet (20 mg total) by mouth daily. 08/19/15   Binnie Rail, MD  Probiotic Product (VSL#3) CAPS Take 1 capsule by mouth daily. 12/02/15   Binnie Rail, MD  valACYclovir (VALTREX) 1000 MG tablet Take two tablets ( total 2000 mg) by mouth q12h x 1 day; Start: ASAP after symptom onset Patient not taking: Reported on 01/06/2016 07/28/15   Burnard Hawthorne, FNP    Family History Family History  Problem Relation Age of Onset  . Breast cancer Mother     dx 11/2009  .  Diabetes Maternal Aunt   . Prostate cancer Maternal Grandfather 44    also hx colon polyps  . Heart disease Paternal Grandmother   . Pancreatic cancer Paternal Grandmother 60  . Adrenal disorder Neg Hx     Social History Social History  Substance Use Topics  . Smoking status: Current Some Day Smoker    Packs/day: 6.00    Types: Cigarettes    Start date: 10/23/1987  . Smokeless tobacco: Never Used  . Alcohol use 0.0 oz/week     Comment: Rarely     Allergies   Patient has no known allergies.   Review of Systems Review of Systems  All other systems reviewed and are negative.    Physical Exam Updated Vital Signs BP (!) 138/115 (BP Location: Left Arm)   Pulse (!) 184   Temp 97.6 F (36.4 C) (Oral)   Resp 20   Ht 5' 5"  (1.651 m)   Wt (!) 149.7 kg   LMP 03/03/2016   SpO2 100%   BMI 54.91 kg/m   Physical Exam  Constitutional: She is oriented to person, place, and time. She appears well-developed and well-nourished. She appears distressed.  HENT:  Head: Normocephalic.  Eyes: Conjunctivae are normal. Pupils are equal, round, and reactive to light.  Neck: Normal range of motion.  Cardiovascular: Tachycardia present.   Pulmonary/Chest: Effort normal.  Abdominal: Soft.  Musculoskeletal: Normal range of motion.  Neurological: She is alert and oriented to person, place, and time.  Skin: Skin is warm. Capillary refill takes less than 2 seconds.  Psychiatric: She has a normal mood and affect.  Nursing note and vitals reviewed.    ED Treatments / Results  Labs (all labs ordered are listed, but only abnormal results are displayed) Labs Reviewed  BASIC METABOLIC PANEL - Abnormal; Notable for the following:       Result Value   CO2 21 (*)    Glucose, Bld 120 (*)    All other components within normal limits  CBC - Abnormal; Notable for the following:    WBC 15.2 (*)    Platelets 406 (*)    All other components within normal limits  I-STAT CHEM 8, ED - Abnormal;  Notable for the following:    BUN 22 (*)    Glucose, Bld 120 (*)    Calcium, Ion 1.06 (*)    All other components within normal limits  I-STAT TROPOININ, ED  Randolm Idol, ED  ED ECG REPORT   Date:  03/08/2016  Rate: 186  Rhythm: supraventricular tachycardia (SVT)  QRS Axis: normal  Intervals: normal  ST/T Wave abnormalities: nonspecific ST changes  Conduction Disutrbances:none  Narrative Interpretation:   Old EKG Reviewed: none available  I have personally reviewed the EKG tracing and agree with the computerized printout as noted.   EKG  EKG Interpretation  Date/Time:  Tuesday March 08 2016 23:41:21 EST Ventricular Rate:  98 PR Interval:    QRS Duration: 92 QT Interval:  343 QTC Calculation: 438 R Axis:   60 Text Interpretation:  Sinus rhythm Consider left atrial enlargement Low voltage, precordial leads Confirmed by Arvil Utz MD, Andee Poles [id number]) on 03/08/2016 11:44:37 PM       Radiology Dg Chest Port 1 View  Result Date: 03/08/2016 CLINICAL DATA:  Acute onset of central and left-sided chest pain. Left arm pain. Tachycardia. Initial encounter. EXAM: PORTABLE CHEST 1 VIEW COMPARISON:  Chest radiograph performed 10/14/2015 FINDINGS: The lungs are well-aerated. Mild vascular congestion is noted. There is no evidence of focal opacification, pleural effusion or pneumothorax. The cardiomediastinal silhouette is mildly enlarged. No acute osseous abnormalities are seen. External pacing pads are noted. IMPRESSION: Mild vascular congestion and mild cardiomegaly. Lungs remain grossly clear. Electronically Signed   By: Garald Balding M.D.   On: 03/08/2016 23:24    Procedures Procedures (including critical care time)  Medications Ordered in ED Medications  adenosine (ADENOCARD) 6 MG/2ML injection (not administered)  adenosine (ADENOCARD) 6 MG/2ML injection (6 mg  Given 03/08/16 2300)  metoprolol (LOPRESSOR) injection 5 mg (5 mg Intravenous Given 03/08/16 2339)  LORazepam  (ATIVAN) injection 0.5 mg (0.5 mg Intravenous Given 03/08/16 2336)  sodium chloride 0.9 % bolus 500 mL (0 mLs Intravenous Stopped 03/08/16 2336)     Initial Impression / Assessment and Plan / ED Course  I have reviewed the triage vital signs and the nursing notes.  Pertinent labs & imaging results that were available during my care of the patient were reviewed by me and considered in my medical decision making (see chart for details).  Clinical Course     Patient arrives with supraventricular tachycardia. She is placed on monitor with defibrillator pads. She received 6 mg of adenosine and converted to normal sinus rhythm. She is given Lopressor and 0.5 of Ativan here. She has resting comfortably and feels improved with Ricci EKG showed a normal sinus rhythm. I will start her on Lopressor and she is given referral to follow-up with cardiology.  Final Clinical Impressions(s) / ED Diagnoses   Final diagnoses:  SVT (supraventricular tachycardia) ([place])    New Prescriptions New Prescriptions   No medications on file     Pattricia Boss, MD 03/08/16 2355

## 2016-03-08 NOTE — ED Triage Notes (Signed)
Pt reports onset of central chest pain with radiation to the L approx 30 min PTA. Pt states she feels pressure in her chest and feels like her heart is pumping fast.

## 2016-03-09 ENCOUNTER — Ambulatory Visit: Payer: BLUE CROSS/BLUE SHIELD

## 2016-03-11 ENCOUNTER — Telehealth: Payer: Self-pay | Admitting: Internal Medicine

## 2016-03-11 NOTE — Telephone Encounter (Signed)
[facility]  Patient Name: [name]  DOB: 25-Sep-1970    Initial Comment Caller states that she was in the hospital and was dx with tachycardia. She is on medication now and her heart rate is low. it is about 54. It is usually in the high 90s. She is wondering if she is taking too much medication. She is not feeling like herself.    Nurse Assessment  Nurse: Harlow Mares, RN, Suanne Marker Date/Time Eilene Ghazi Time): 03/11/2016 8:28:15 PM  Confirm and document reason for call. If symptomatic, describe symptoms. ---Caller states that she was in the hospital and was dx with tachycardia. She is on medication now and her heart rate is low. it is about 54. It is usually in the high 90s. She is wondering if she is taking too much medication. She is not feeling like herself.  Does the patient have any new or worsening symptoms? ---Yes  Will a triage be completed? ---Yes  Related visit to physician within the last 2 weeks? ---Yes  Does the PT have any chronic conditions? (i.e. diabetes, asthma, etc.) ---Yes  List chronic conditions. ---adrenal gland tumor (watching); anxiety  Is the patient pregnant or possibly pregnant? (Ask all females between the ages of 38-55) ---No  Is this a behavioral health or substance abuse call? ---No     Guidelines    Guideline Title Affirmed Question Affirmed Notes  Heart Rate and Heartbeat Questions History of hyperthyroidism or taking thyroid medication    Final Disposition User   See PCP When Office is Open (within 3 days) Harlow Mares, RN, [employer] has called back and says her phone has not rang but that is the correct number.  Caller said she missed a call from the nurse but her phone did not ring.  Scheduled caller with Terri Piedra for Monday 03/14/16 @ 9:30am at the [facility] office. Caller voiced understanding.   Referrals  REFERRED TO PCP OFFICE   Disagree/Comply: Comply

## 2016-03-11 NOTE — Telephone Encounter (Signed)
[facility]  Patient Name: [name]  DOB: 09-03-1970    Initial Comment Caller states that she was in the hospital and was dx with tachycardia. She is on medication now and her heart rate is low. it is about 54. It is usually in the high 90s. She is wondering if she is taking too much medication. She is not feeling like herself.    Nurse Assessment      Guidelines    Guideline Title Affirmed Question Affirmed Notes       Final Disposition User   FINAL ATTEMPT MADE - no message left Harlow Mares, Therapist, sports, Suanne Marker

## 2016-03-14 ENCOUNTER — Ambulatory Visit (INDEPENDENT_AMBULATORY_CARE_PROVIDER_SITE_OTHER): Payer: BLUE CROSS/BLUE SHIELD | Admitting: Family

## 2016-03-14 ENCOUNTER — Encounter: Payer: Self-pay | Admitting: Family

## 2016-03-14 VITALS — BP 118/80 | HR 69 | Temp 98.0°F | Resp 18 | Ht 65.0 in | Wt 336.0 lb

## 2016-03-14 DIAGNOSIS — I471 Supraventricular tachycardia, unspecified: Secondary | ICD-10-CM | POA: Insufficient documentation

## 2016-03-14 DIAGNOSIS — E538 Deficiency of other specified B group vitamins: Secondary | ICD-10-CM | POA: Diagnosis not present

## 2016-03-14 MED ORDER — CYANOCOBALAMIN 1000 MCG/ML IJ SOLN
1000.0000 ug | Freq: Once | INTRAMUSCULAR | Status: AC
  Administered 2016-03-14: 1000 ug via INTRAMUSCULAR

## 2016-03-14 NOTE — Progress Notes (Signed)
Subjective:    Patient ID: [name], female    DOB: [birth year], 45 y.o.   MRN: [id number]  Chief Complaint  Patient presents with  . Follow-up    tsh check per nurse on call    HPI:  [name] is a 45 y.o. female who  has a past medical history of Anxiety; ASTHMA; DELAYED GASTRIC EMPTYING; DEPRESSION; GERD; OBESITY; OBSTRUCTIVE SLEEP APNEA; POLYCYSTIC OVARIAN DISEASE; SMOKER; Umbilical hernia; Umbilical hernia; Vitamin B12 deficiency (03/2015 dx); and Vitamin D deficiency (03/2015 dx). and presents today for an acute office visit.   Associated symptom of pressure located in her chest and feeling like she was going to pass out. She was treated in the ED for SVT and her heart rate was noted to be 196. Since that time she notes that her heart rates have been in the 50's on occasion which was resulting in her anxiety. Heart rate was monitored by her fitbit. She was started on metoprolol which she reports taking the medication as prescribed and denies adverse side effects. Denies any further episdoes of shortness of breath or chest pain.     No Known Allergies    Outpatient Medications Prior to Visit  Medication Sig Dispense Refill  . acyclovir ointment (ZOVIRAX) 5 % Apply 1 application topically every 3 (three) hours. 30 g 5  . albuterol (PROAIR HFA) 108 (90 BASE) MCG/ACT inhaler Inhale 2 puffs into the lungs every 4 (four) hours as needed. 18 g 2  . ALPRAZolam (XANAX) 0.5 MG tablet Take 1 tablet (0.5 mg total) by mouth every 8 (eight) hours as needed for sleep or anxiety. 30 tablet 1  . calcium carbonate (TUMS - DOSED IN MG ELEMENTAL CALCIUM) 500 MG chewable tablet Chew 2 tablets by mouth daily.      . Cholecalciferol (VITAMIN D) 2000 units tablet Take 2,000 Units by mouth daily.    . cyanocobalamin (,VITAMIN B-12,) 1000 MCG/ML injection Inject 1 mL (1,000 mcg total) into the muscle every 30 (thirty) days. 1 mL 0  . cyclobenzaprine (FLEXERIL) 10 MG tablet Take 1 tablet (10 mg  total) by mouth 3 (three) times daily. 20 tablet 1  . ibuprofen (ADVIL,MOTRIN) 800 MG tablet Take 1 tablet (800 mg total) by mouth every 8 (eight) hours as needed. 60 tablet 2  . linaclotide (LINZESS) 145 MCG CAPS capsule Take 1 capsule (145 mcg total) by mouth daily before breakfast. 30 capsule 6  . metoprolol (LOPRESSOR) 50 MG tablet Take 1 tablet (50 mg total) by mouth 2 (two) times daily. 60 tablet 0  . OVER THE COUNTER MEDICATION Plexus Packets for drink  and pills    . pantoprazole (PROTONIX) 40 MG tablet Take 1 tablet (40 mg total) by mouth daily. 30 tablet 1  . PARoxetine (PAXIL) 20 MG tablet Take 1 tablet (20 mg total) by mouth daily. 90 tablet 3  . Probiotic Product (VSL#3) CAPS Take 1 capsule by mouth daily. 30 capsule 5  . valACYclovir (VALTREX) 1000 MG tablet Take two tablets ( total 2000 mg) by mouth q12h x 1 day; Start: ASAP after symptom onset 6 tablet 2   No facility-administered medications prior to visit.       Past Surgical History:  Procedure Laterality Date  . CESAREAN SECTION  97 & 98   x's 2  . Cocxyl removal  1995  . DILATION AND CURETTAGE OF UTERUS    . LAPAROSCOPY    . [place]  Past Medical History:  Diagnosis Date  . Anxiety   . ASTHMA   . DELAYED GASTRIC EMPTYING   . DEPRESSION   . GERD   . OBESITY   . OBSTRUCTIVE SLEEP APNEA   . POLYCYSTIC OVARIAN DISEASE   . SMOKER   . Umbilical hernia   . Umbilical hernia   . Vitamin B12 deficiency 03/2015 dx   start IM replacement q 30d  . Vitamin D deficiency 03/2015 dx    Review of Systems  Constitutional: Negative for chills and fever.  Respiratory: Negative for chest tightness, shortness of breath and wheezing.   Cardiovascular: Negative for chest pain, palpitations and leg swelling.  Neurological: Negative for dizziness, syncope, weakness and light-headedness.      Objective:    BP 118/80 (BP Location: Left Arm, Patient Position: Sitting, Cuff Size: Large)   Pulse 69   Temp  98 F (36.7 C) (Oral)   Resp 18   Ht 5' 5"  (1.651 m)   Wt (!) 336 lb (152.4 kg)   LMP 03/03/2016   SpO2 97%   BMI 55.91 kg/m  Nursing note and vital signs reviewed.  Physical Exam  Constitutional: She is oriented to person, place, and time. She appears well-developed and well-nourished. No distress.  Cardiovascular: Normal rate, regular rhythm, normal heart sounds and intact distal pulses.   Pulmonary/Chest: Effort normal and breath sounds normal.  Neurological: She is alert and oriented to person, place, and time.  Skin: Skin is warm and dry.  Psychiatric: She has a normal mood and affect. Her behavior is normal. Judgment and thought content normal.       Assessment & Plan:   Problem List Items Addressed This Visit      Cardiovascular and Mediastinum   SVT (supraventricular tachycardia) ([place]) - Primary    This is a new problem. No further episodes of SVT or associated symptoms of chest pain or shortness of breath. Continue current dosage of metoprolol. Recommend continued follow up with cardiology as scheduled. Discussed monitoring pulse manually. Follow up or seek emergency care if symptoms return.        Other Visit Diagnoses    B12 deficiency       Relevant Medications   cyanocobalamin ((VITAMIN B-12)) injection 1,000 mcg (Completed)       I am having Ms. Tayag maintain her calcium carbonate, albuterol, ALPRAZolam, cyanocobalamin, acyclovir ointment, valACYclovir, PARoxetine, Vitamin D, linaclotide, VSL#3, OVER THE COUNTER MEDICATION, pantoprazole, cyclobenzaprine, ibuprofen, and metoprolol. We administered cyanocobalamin.   Meds ordered this encounter  Medications  . cyanocobalamin ((VITAMIN B-12)) injection 1,000 mcg     Follow-up: Return if symptoms worsen or fail to improve.  Mauricio Po, FNP

## 2016-03-14 NOTE — Assessment & Plan Note (Signed)
This is a new problem. No further episodes of SVT or associated symptoms of chest pain or shortness of breath. Continue current dosage of metoprolol. Recommend continued follow up with cardiology as scheduled. Discussed monitoring pulse manually. Follow up or seek emergency care if symptoms return.

## 2016-03-14 NOTE — Patient Instructions (Addendum)
Thank you for choosing [facility].  SUMMARY AND INSTRUCTIONS:  Follow up with cardiologist.   Medication:  Please continue to take your medications as prescribed.  May decrease to 25 mg twice daily if needed.  Your prescription(s) have been submitted to your pharmacy or been printed and provided for you. Please take as directed and contact our office if you believe you are having problem(s) with the medication(s) or have any questions.  Follow up:  If your symptoms worsen or fail to improve, please contact our office for further instruction, or in case of emergency go directly to the emergency room at the closest medical facility.   Supraventricular Tachycardia, Adult Supraventricular tachycardia (SVT) is a type of abnormal heart rhythm. It causes the heart to beat very quickly and then return to a normal speed. A normal heart rate is 60-100 beats per minute. During an episode of SVT, your heart rate may be higher than 150 beats per minute. Episodes of SVT can be frightening, but they are usually not dangerous. However, if episodes happens often or last for long periods of time, they may lead to heart failure. What are the causes? Usually, a normal heartbeat starts when an area called the sinoatrial node releases an electrical signal. In SVT, other areas of the heart send out electrical signals that interfere with the signal from the sinoatrial node. It is not known why some people get SVT and others do not. What increases the risk? This condition is more likely to develop in:  People who are 64?44 years old.  Women. Factors that may increase your chances of an attack include:  Stress.  Tiredness.  Smoking.  Stimulant drugs, such as cocaine and methamphetamine.  Alcohol.  Caffeine.  Pregnancy.  Anxiety. What are the signs or symptoms? Symptoms of this condition include:  A pounding heart.  A feeling that the heart is skipping beats  (palpitations).  Weakness.  Shortness of breath.  Tightness or pain in your chest.  Light-headedness.  Anxiety.  Dizziness.  Sweating.  Nausea.  Fainting.  Fatigue or tiredness. A mild episode may not cause symptoms. How is this diagnosed? This condition may be diagnosed based on:  Your symptoms.  A physical exam. If you are have an episode of SVT during the exam, the health care provider may be able to diagnose SVT by listening to your heart and feeling your pulse.  Tests. These may include:  An electrocardiogram (ECG). This test is done to check for problems with electrical activity in the heart.  A Holter monitor or event monitor test. This test involves wearing a portable device that monitors your heart rate over time.  An echocardiogram. This test involves taking an image of your heart using sound waves. It is done to rule out other causes of a fast heart rate.  Blood tests. How is this treated? This condition may be treated with:  Vagal nerve stimulation. The treatment involves stimulating your vagus nerve, which slows down the heart. It is often the first and only treatment that is needed for this condition. It is a good idea to try the several ways of doing vagal stimulation to find which one works best for you. Ways to do this treatment include:  Holding your breath and pushing, as though you are having a bowel movement.  Massaging an area on one side of your neck, below your jaw. Do not try this yourself. Only a health care provider should do this. If done the wrong way,  it can lead to a stroke.  Bending forward with your head between your legs.  Coughing while bending forward with your head between your legs.  Closing your eyes and massaging your eyeballs. A health care provider should guide you through this method before you try it on your own.  Medicines that prevent attacks.  Medicine to stop an attack. The medicine is given through an IV tube at  the hospital.  A small electric shock (cardioversion) that stops an attack. Before you get the shock, you will get medicine to make you fall asleep.  Radiofrequency ablation. In this procedure, a small, thin tube (catheter) is used to send radiofrequency energy to the area of tissue that is causing the rapid heartbeats. The energy kills the cells and helps your heart keep a normal rhythm. You may have this treatment if you have symptoms of SVT often. If you do not have symptoms, you may not need treatment. Follow these instructions at home: Stress  Avoid stressful situations when possible.  Find healthy ways of managing stress that work for you. Some healthy ways to manage stress include:  Taking part in relaxing activities, such as yoga, meditation, or being out in nature.  Listening to relaxing music.  Practicing relaxation techniques, such as deep breathing.  Leading a healthy lifestyle. This involves getting plenty of sleep, exercising, and eating a balanced diet.  Attending counseling or talk therapy with a mental health professional. Sleep  Try to get at least 7 hours of sleep each night. Tobacco and nicotine  Do not use any products that contain nicotine or tobacco, such as cigarettes and e-cigarettes. If you need help quitting, ask your health care provider. Alcohol  If alcohol triggers episodes of SVT, do not drink alcohol.  If alcohol does not seem to trigger episodes, limit alcohol intake to no more than 1 drink a day for nonpregnant women and 2 drinks a day for men. One drink equals 12 oz of beer, 5 oz of wine, or 1 oz of hard liquor. Caffeine  If caffeine triggers episodes of SVT, do not eat, drink, or use anything with caffeine in it.  If caffeine does not seem to trigger episodes, consume caffeine in moderation. Stimulant drugs  Do not use stimulant drugs. If you need help quitting, talk with your health care provider. General instructions  Maintain a  healthy weight.  Exercise regularly. Ask your health care provider to suggest some good activities for you. Aim for one or a combination of the following:  150 minutes per week of moderate exercise, such as walking or yoga.  75 minutes per week of vigorous exercise, such as running or swimming.  Perform vagus nerve stimulation as directed by your health care provider.  Take over-the-counter and prescription medicines only as told by your health care provider. Contact a health care provider if:  You have episodes of SVT more often than before.  Episodes of SVT last longer than before.  Vagus nerve stimulation is no longer helping.  You have new symptoms. Get help right away if:  You have chest pain.  Your symptoms get worse.  You have trouble breathing.  You have an episode of SVT that lasts longer than 20 minutes.  You faint. These symptoms may represent a serious problem that is an emergency. Do not wait to see if the symptoms will go away. Get medical help right away. Call your local emergency services (911 in the [place] Do not drive yourself to the hospital.  This information is not intended to replace advice given to you by your health care provider. Make sure you discuss any questions you have with your health care provider. Document Released: 03/28/2005 Document Revised: 12/03/2015 Document Reviewed: 12/03/2015 Elsevier Interactive Patient Education  2017 [employer].

## 2016-03-16 ENCOUNTER — Ambulatory Visit: Payer: BLUE CROSS/BLUE SHIELD

## 2016-03-16 ENCOUNTER — Telehealth: Payer: Self-pay | Admitting: Endocrinology

## 2016-03-16 DIAGNOSIS — E279 Disorder of adrenal gland, unspecified: Principal | ICD-10-CM

## 2016-03-16 DIAGNOSIS — E278 Other specified disorders of adrenal gland: Secondary | ICD-10-CM

## 2016-03-16 NOTE — Telephone Encounter (Signed)
Wells Guiles with [facility] called in requesting to speak with Dr. Cordelia Pen nurse.  Her call back number is [phone]

## 2016-03-16 NOTE — Telephone Encounter (Signed)
[name] with [facility] called back and stated the order that is placed for the Abdomen and pelvis is normal just the abdomen when checking the Adrenal glands and this will be the patient's 3 rd scan in 5 months. Wanted to clarify if the patient should still have the scan done and if so can the order be changed to from abdomen and pelvis to just abdomen.  Call back number [phone]

## 2016-03-16 NOTE — Telephone Encounter (Signed)
I changed to abd only.  She needs another one, because the first 2 were so close together.

## 2016-03-17 ENCOUNTER — Inpatient Hospital Stay: Admission: RE | Admit: 2016-03-17 | Payer: BLUE CROSS/BLUE SHIELD | Source: Ambulatory Visit

## 2016-03-17 ENCOUNTER — Ambulatory Visit: Payer: BLUE CROSS/BLUE SHIELD | Admitting: Gastroenterology

## 2016-03-17 ENCOUNTER — Other Ambulatory Visit: Payer: BLUE CROSS/BLUE SHIELD

## 2016-03-17 NOTE — Telephone Encounter (Signed)
I contacted Wells Guiles with [facility] and left a voicemail advising of message. Requested a call back if she had any further questions.

## 2016-03-23 ENCOUNTER — Encounter: Payer: Self-pay | Admitting: *Deleted

## 2016-03-23 ENCOUNTER — Ambulatory Visit (INDEPENDENT_AMBULATORY_CARE_PROVIDER_SITE_OTHER): Payer: BLUE CROSS/BLUE SHIELD | Admitting: Physician Assistant

## 2016-03-23 ENCOUNTER — Encounter: Payer: Self-pay | Admitting: Physician Assistant

## 2016-03-23 VITALS — BP 120/82 | HR 63 | Ht 65.0 in | Wt 333.0 lb

## 2016-03-23 DIAGNOSIS — R072 Precordial pain: Secondary | ICD-10-CM

## 2016-03-23 DIAGNOSIS — G4730 Sleep apnea, unspecified: Secondary | ICD-10-CM

## 2016-03-23 DIAGNOSIS — I471 Supraventricular tachycardia: Secondary | ICD-10-CM

## 2016-03-23 DIAGNOSIS — Z8249 Family history of ischemic heart disease and other diseases of the circulatory system: Secondary | ICD-10-CM

## 2016-03-23 DIAGNOSIS — Z720 Tobacco use: Secondary | ICD-10-CM

## 2016-03-23 MED ORDER — METOPROLOL TARTRATE 25 MG PO TABS: 25.0000 mg | ORAL_TABLET | Freq: Two times a day (BID) | ORAL | 3 refills | Status: DC

## 2016-03-23 NOTE — Progress Notes (Signed)
Cardiology Office Note    Date:  03/23/2016   ID:  [name], DOB Aug 01, 1970, MRN [id number]  PCP:  Binnie Rail, MD  Cardiologist: Dr. Stanford Breed wants to be followed in [facility]  Chief Complaint  Patient presents with  . Chest Pain    History of Present Illness:  [name] is a 45 y.o. female with history of chest pain and normal Myoview in 2008 that showed an EF of 66%. CardioNet monitor in 2010 sinus to sinus tachycardia with occasional PVCs and brief runs of paroxysmal atrial tachycardia. Echo in 2010 LV was in low normal range 50-55%. Saw Dr. Stanford Breed 2015 with 2 year history of intermittent chest pain. GXT ordered and she had poor exercise tolerance for age but no EKG changes. 2-D echo showed normal LVEF 55-65%.  Patient was in [facility] emergency room 03/08/16 with sudden onset of chest pain and palpitations. She had never had similar symptoms in the past. She was found to be in SVT at 186/m and converted to normal sinus rhythm with adenosine 6 mg. She was given Lopressor and Ativan as well. Follow-up EKG showed normal sinus rhythm without acute change. Troponin negative WBC 15.2 potassium 3.6.  Family history MGM and MGF MI's in 18's but lived to 25's. 3 uncles with MI's in 55's and 55's.  Patient comes in today for follow up. She had been taking Plexus Slim for weight loss but doesn't think it contains caffeine-will bring in next visit. She stopped it after the emergency room visit. Worried because HR staying in 70's and dropped to 50's on metoprolol. Feels exhausted. Smokes 1/2 ppd. Has stabbing chest pains in left shoulder at times but had severe pressure with SVT. Worried something is wrong with her heart. Also thinks she has sleep apnea. She was diagnosed with a is ago but felt to be mild and doesn't wear CPAP. She's gained a lot of weight and says she wakes up gasping for breath at times. She would like referred back to Dr. Halford Chessman. She is also having a CT scan tomorrow to  follow-up on an adrenal nodule that they found on CT in the past    Past Medical History:  Diagnosis Date  . Anxiety   . ASTHMA   . DELAYED GASTRIC EMPTYING   . DEPRESSION   . GERD   . OBESITY   . OBSTRUCTIVE SLEEP APNEA   . POLYCYSTIC OVARIAN DISEASE   . SMOKER   . Umbilical hernia   . Umbilical hernia   . Vitamin B12 deficiency 03/2015 dx   start IM replacement q 30d  . Vitamin D deficiency 03/2015 dx    Past Surgical History:  Procedure Laterality Date  . CESAREAN SECTION  97 & 98   x's 2  . Cocxyl removal  1995  . DILATION AND CURETTAGE OF UTERUS    . LAPAROSCOPY    . TUBAL LIGATION  1998    Current Medications: Outpatient Medications Prior to Visit  Medication Sig Dispense Refill  . acyclovir ointment (ZOVIRAX) 5 % Apply 1 application topically every 3 (three) hours. 30 g 5  . albuterol (PROAIR HFA) 108 (90 BASE) MCG/ACT inhaler Inhale 2 puffs into the lungs every 4 (four) hours as needed. 18 g 2  . ALPRAZolam (XANAX) 0.5 MG tablet Take 1 tablet (0.5 mg total) by mouth every 8 (eight) hours as needed for sleep or anxiety. 30 tablet 1  . calcium carbonate (TUMS - DOSED IN MG ELEMENTAL CALCIUM) 500  MG chewable tablet Chew 2 tablets by mouth daily.      . Cholecalciferol (VITAMIN D) 2000 units tablet Take 2,000 Units by mouth daily.    . cyanocobalamin (,VITAMIN B-12,) 1000 MCG/ML injection Inject 1 mL (1,000 mcg total) into the muscle every 30 (thirty) days. 1 mL 0  . cyclobenzaprine (FLEXERIL) 10 MG tablet Take 1 tablet (10 mg total) by mouth 3 (three) times daily. 20 tablet 1  . ibuprofen (ADVIL,MOTRIN) 800 MG tablet Take 1 tablet (800 mg total) by mouth every 8 (eight) hours as needed. 60 tablet 2  . linaclotide (LINZESS) 145 MCG CAPS capsule Take 1 capsule (145 mcg total) by mouth daily before breakfast. 30 capsule 6  . OVER THE COUNTER MEDICATION Plexus Packets for drink  and pills    . pantoprazole (PROTONIX) 40 MG tablet Take 1 tablet (40 mg total) by mouth  daily. 30 tablet 1  . PARoxetine (PAXIL) 20 MG tablet Take 1 tablet (20 mg total) by mouth daily. 90 tablet 3  . Probiotic Product (VSL#3) CAPS Take 1 capsule by mouth daily. 30 capsule 5  . valACYclovir (VALTREX) 1000 MG tablet Take two tablets ( total 2000 mg) by mouth q12h x 1 day; Start: ASAP after symptom onset 6 tablet 2  . metoprolol (LOPRESSOR) 50 MG tablet Take 1 tablet (50 mg total) by mouth 2 (two) times daily. 60 tablet 0   No facility-administered medications prior to visit.      Allergies:   Patient has no known allergies.   Social History   Social History  . Marital status: Married    Spouse name: N/A  . Number of children: 2  . Years of education: N/A   Occupational History  .   [employer]    CNA   Social History Main Topics  . Smoking status: Current Some Day Smoker    Packs/day: 0.50    Types: Cigarettes    Start date: 10/23/1987  . Smokeless tobacco: Never Used  . Alcohol use 0.0 oz/week     Comment: Rarely  . Drug use: No  . Sexual activity: Not Asked   Other Topics Concern  . None   Social History Narrative   Lives with husband & 2 kids from 1st marriage. (1st husband expired 1998-leukemia) work as Merchandiser, retail, but currently unemployed     Family History:  The patient's family history includes Breast cancer in her mother; Diabetes in her maternal aunt; Heart disease in her paternal grandmother; Heart murmur in her sister; Pancreatic cancer (age of onset: 6) in her paternal grandmother; Prostate cancer (age of onset: 8) in her maternal grandfather.   ROS:   Please see the history of present illness.    Review of Systems  Constitution: Positive for malaise/fatigue and weight gain.  HENT: Negative.   Eyes: Negative.   Cardiovascular: Positive for chest pain, dyspnea on exertion and palpitations.  Respiratory: Positive for sleep disturbances due to breathing and snoring.   Hematologic/Lymphatic: Negative.   Musculoskeletal:  Negative.  Negative for joint pain.  Gastrointestinal: Negative.   Genitourinary: Negative.   Neurological: Negative.   Psychiatric/Behavioral: The patient is nervous/anxious.    All other systems reviewed and are negative.   PHYSICAL EXAM:   VS:  BP 120/82   Pulse 63   Ht 5' 5"  (1.651 m)   Wt (!) 333 lb (151 kg)   LMP 03/03/2016   SpO2 95%   BMI 55.41 kg/m   Physical Exam  GEN: Obese, in no acute distress  Neck: no JVD, carotid bruits, or masses Cardiac:RRR; no murmurs, rubs, or gallops  Respiratory:  clear to auscultation bilaterally, normal work of breathing GI: soft, nontender, nondistended, + BS Ext: without cyanosis, clubbing, or edema, Good distal pulses bilaterally MS: no deformity or atrophy  Psych: euthymic mood, full affect  Wt Readings from Last 3 Encounters:  03/23/16 (!) 333 lb (151 kg)  03/14/16 (!) 336 lb (152.4 kg)  03/08/16 (!) 330 lb (149.7 kg)      Studies/Labs Reviewed:   EKG:  EKG is not ordered today.  EKG is reviewed from emergency room. See above note  Recent Labs: 10/14/2015: ALT 15 03/08/2016: BUN 17; Creatinine, Ser 1.00; Hemoglobin 13.6; Platelets 406; Potassium 3.6; Sodium 136   Lipid Panel    Component Value Date/Time   CHOL 208 (A) 02/18/2015   TRIG 117 02/18/2015   HDL 37 02/18/2015   CHOLHDL 6 08/16/2012 0828   VLDL 25.0 08/16/2012 0828   LDLCALC 148 02/18/2015   LDLDIRECT 154.1 08/16/2012 0828    Additional studies/ records that were reviewed today include:   GXT 2015 ETT Interpretation:  normal - no evidence of ischemia by ST  analysis  Comments: THR achieved Poor exercise tolerance for age Dyspnea, but no chest pain noted during the study Delayed HR and BP recovery Duke Treadmill Score: +4  Pixie Casino, MD, [facility] Attending Cardiologist [facility]  2-D echo 2015 Study Conclusions  - Left ventricle: The cavity size was normal. Wall thickness was   increased in a pattern of mild LVH. Systolic function  was normal.   The estimated ejection fraction was in the range of 55% to 65%.   Wall motion was normal; there were no regional wall motion   abnormalities.     ASSESSMENT:    1. Precordial pain   2. SVT (supraventricular tachycardia) ([place])   3. Sleep apnea, unspecified type   4. Morbid obesity ([place])   5. Tobacco abuse   6. Family history of early CAD      PLAN:  In order of problems listed above:  Chest pain somewhat atypical but also had chest pressure with SVT. Has multiple cardiac risk factors including early family history of CAD, tobacco abuse and obesity. We'll order exercise Myoview. Because of her weight it will need to be a 2 day study.  SVT converted to normal sinus rhythm with adenosine 6 mg IV. She is now on metoprolol 50 mg twice a day. She is having a lot of symptoms of fatigue and sensation of her heart rate going to slow. We'll decrease to 25 mg twice a day. Patient was on plexus Slim diet supplement when this happened. She states that has no caffeine but will bring the packet in to let us review the ingredients. Advised her to avoid any stimulants including decongestants, caffeine, stop smoking.  Sleep apnea which she thinks is gotten worse. Refer back to Dr. Halford Chessman  Morbid obesity weight loss essential  Tobacco abuse smoking cessation discussed especially in light of her family history of early CAD.  Family history of early CAD     Medication Adjustments/Labs and Tests Ordered: Current medicines are reviewed at length with the patient today.  Concerns regarding medicines are outlined above.  Medication changes, Labs and Tests ordered today are listed in the Patient Instructions below. Patient Instructions  Your physician recommends that you schedule a follow-up appointment with Dr. Bronson Ing  Your physician has recommended you make  the following change in your medication:  Decrease Metoprolol to 25 mg Two times daily   Your physician has requested that you  have en exercise stress myoview. For further information please visit [email or web address]. Please follow instruction sheet, as given.  You have been referred to Dr. Halford Chessman    If you need a refill on your cardiac medications before your next appointment, please call your pharmacy.       Signed, Ermalinda Barrios, PA-C  03/23/2016 2:52 PM    [facility] [address], [place], [place]  [address] Phone: [phone]; Fax: [phone]

## 2016-03-23 NOTE — Patient Instructions (Addendum)
Your physician recommends that you schedule a follow-up appointment with Dr. Bronson Ing  Your physician has recommended you make the following change in your medication:  Decrease Metoprolol to 25 mg Two times daily   Your physician has requested that you have en exercise stress myoview. For further information please visit [email or web address]. Please follow instruction sheet, as given.  You have been referred to Dr. Halford Chessman    If you need a refill on your cardiac medications before your next appointment, please call your pharmacy.

## 2016-03-24 ENCOUNTER — Ambulatory Visit
Admission: RE | Admit: 2016-03-24 | Discharge: 2016-03-24 | Disposition: A | Discharge status: Home / Self Care | Payer: BLUE CROSS/BLUE SHIELD | Source: Ambulatory Visit | Attending: Endocrinology | Admitting: Endocrinology

## 2016-03-24 DIAGNOSIS — E279 Disorder of adrenal gland, unspecified: Principal | ICD-10-CM

## 2016-03-24 DIAGNOSIS — E278 Other specified disorders of adrenal gland: Secondary | ICD-10-CM

## 2016-03-31 ENCOUNTER — Encounter (HOSPITAL_COMMUNITY): Payer: BLUE CROSS/BLUE SHIELD

## 2016-04-07 ENCOUNTER — Encounter (HOSPITAL_COMMUNITY): Payer: BLUE CROSS/BLUE SHIELD

## 2016-04-07 ENCOUNTER — Inpatient Hospital Stay (HOSPITAL_COMMUNITY): Admission: RE | Admit: 2016-04-07 | Payer: BLUE CROSS/BLUE SHIELD | Source: Ambulatory Visit

## 2016-04-08 ENCOUNTER — Encounter (HOSPITAL_COMMUNITY): Payer: BLUE CROSS/BLUE SHIELD

## 2016-04-16 ENCOUNTER — Encounter: Payer: Self-pay | Admitting: *Deleted

## 2016-04-16 ENCOUNTER — Emergency Department (INDEPENDENT_AMBULATORY_CARE_PROVIDER_SITE_OTHER)
Admission: EM | Admit: 2016-04-16 | Discharge: 2016-04-16 | Disposition: A | Discharge status: Home / Self Care | Payer: BLUE CROSS/BLUE SHIELD | Source: Home / Self Care | Attending: Family Medicine | Admitting: Family Medicine

## 2016-04-16 DIAGNOSIS — B9789 Other viral agents as the cause of diseases classified elsewhere: Secondary | ICD-10-CM

## 2016-04-16 DIAGNOSIS — J069 Acute upper respiratory infection, unspecified: Secondary | ICD-10-CM

## 2016-04-16 HISTORY — DX: Tachycardia, unspecified: R00.0

## 2016-04-16 MED ORDER — GUAIFENESIN-CODEINE 100-10 MG/5ML PO SOLN: ORAL | 0 refills | Status: DC

## 2016-04-16 MED ORDER — DOXYCYCLINE HYCLATE 100 MG PO CAPS: 100.0000 mg | ORAL_CAPSULE | Freq: Two times a day (BID) | ORAL | 0 refills | Status: DC

## 2016-04-16 NOTE — ED Triage Notes (Signed)
Pt c/o HA, nasal congestion and cough that is productive at times  x 2 days. Temp 99.9 last night.

## 2016-04-16 NOTE — Discharge Instructions (Signed)
Take plain guaifenesin (1253m extended release tabs such as Mucinex) twice daily, with plenty of water, for cough and congestion. Get adequate rest.   May use Afrin nasal spray (or generic oxymetazoline) twice daily for about 5 days and then discontinue.  Also recommend using saline nasal spray several times daily and saline nasal irrigation (AYR is a common brand).  Use Flonase nasal spray each morning after using Afrin nasal spray and saline nasal irrigation. Try warm salt water gargles for sore throat.  Stop all antihistamines for now, and other non-prescription cough/cold preparations. Begin doxycycline if not improving about one week or if persistent fever develops. Follow-up with family doctor if not improving about10 days.

## 2016-04-16 NOTE — ED Provider Notes (Signed)
[name]    CSN: [id number] Arrival date & time: 04/16/16  1318     History   Chief Complaint Chief Complaint  Patient presents with  . Headache  . Nasal Congestion    HPI [name] is a 46 y.o. female.   Patient complains of three day history of typical cold-like symptoms developing over several days, including mild sore throat, sinus congestion, headache, myalgias, fatigue, and cough.  She had fever to 99+ yesterday. She has a past history of pneumonia three years ago.   The history is provided by the patient.    Past Medical History:  Diagnosis Date  . Anxiety   . ASTHMA   . DELAYED GASTRIC EMPTYING   . DEPRESSION   . GERD   . OBESITY   . OBSTRUCTIVE SLEEP APNEA   . POLYCYSTIC OVARIAN DISEASE   . SMOKER   . Tachycardia   . Umbilical hernia   . Umbilical hernia   . Vitamin B12 deficiency 03/2015 dx   start IM replacement q 30d  . Vitamin D deficiency 03/2015 dx    Patient Active Problem List   Diagnosis Date Noted  . Family history of early CAD 03/23/2016  . SVT (supraventricular tachycardia) ([place]) 03/14/2016  . Adrenal nodule ([place]) 01/06/2016  . Nausea without vomiting 12/02/2015  . 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 Umbilical hernia 19/41/7408  . Glucose intolerance (impaired glucose tolerance) 04/09/2015  . Morbid obesity ([place]) 04/09/2015  . Vitamin D deficiency   . Vitamin B12 deficiency   . Anxiety   . Tobacco abuse 01/28/2009  . Depression 01/28/2009  . GERD 01/28/2009  . POLYCYSTIC OVARIAN DISEASE 02/10/2007  . Obstructive sleep apnea 02/10/2007  . Asthma 02/10/2007    Past Surgical History:  Procedure Laterality Date  . CESAREAN SECTION  97 & 98   x's 2  . Cocxyl removal  1995  . DILATION AND CURETTAGE OF UTERUS    . LAPAROSCOPY    . TUBAL LIGATION  1998    OB History    Gravida Para Term Preterm AB Living   3 2 2   1      SAB TAB Ectopic Multiple Live Births     1             Home Medications     Prior to Admission medications   Medication Sig Start Date End Date Taking? Authorizing Provider  acyclovir ointment (ZOVIRAX) 5 % Apply 1 application topically every 3 (three) hours. 07/16/15   Binnie Rail, MD  albuterol (PROAIR HFA) 108 (90 BASE) MCG/ACT inhaler Inhale 2 puffs into the lungs every 4 (four) hours as needed. 01/02/14   Rowe Clack, MD  ALPRAZolam Duanne Moron) 0.5 MG tablet Take 1 tablet (0.5 mg total) by mouth every 8 (eight) hours as needed for sleep or anxiety. 04/09/15   Rowe Clack, MD  Cholecalciferol (VITAMIN D) 2000 units tablet Take 2,000 Units by mouth daily.    Historical Provider, MD  cyanocobalamin (,VITAMIN B-12,) 1000 MCG/ML injection Inject 1 mL (1,000 mcg total) into the muscle every 30 (thirty) days. 04/09/15   Rowe Clack, MD  doxycycline (VIBRAMYCIN) 100 MG capsule Take 1 capsule (100 mg total) by mouth 2 (two) times daily. Take with food (Rx void after 04/24/16) 04/16/16   Kandra Nicolas, MD  guaiFENesin-codeine 100-10 MG/5ML syrup Take 58m by mouth at bedtime as needed for cough 04/16/16   SKandra Nicolas MD  metoprolol tartrate (  LOPRESSOR) 25 MG tablet Take 1 tablet (25 mg total) by mouth 2 (two) times daily. 03/23/16 06/21/16  Imogene Burn, PA-C  OVER THE COUNTER MEDICATION Plexus Packets for drink  and pills    Historical Provider, MD  pantoprazole (PROTONIX) 40 MG tablet Take 1 tablet (40 mg total) by mouth daily. 01/13/16   Gregor Hams, MD  PARoxetine (PAXIL) 20 MG tablet Take 1 tablet (20 mg total) by mouth daily. 08/19/15   Binnie Rail, MD  valACYclovir (VALTREX) 1000 MG tablet Take two tablets ( total 2000 mg) by mouth q12h x 1 day; Start: ASAP after symptom onset 07/28/15   Burnard Hawthorne, FNP    Family History Family History  Problem Relation Age of Onset  . Breast cancer Mother     dx 11/2009  . Diabetes Maternal Aunt   . Prostate cancer Maternal Grandfather 28    also hx colon polyps  . Heart disease Paternal Grandmother    . Pancreatic cancer Paternal Grandmother 18  . Heart murmur Sister   . Adrenal disorder Neg Hx     Social History Social History  Substance Use Topics  . Smoking status: Current Some Day Smoker    Packs/day: 0.50    Types: Cigarettes    Start date: 10/23/1987  . Smokeless tobacco: Never Used  . Alcohol use 0.0 oz/week     Comment: Rarely     Allergies   Patient has no known allergies.   Review of Systems Review of Systems  + sore throat + cough No pleuritic pain ? wheezing + nasal congestion + post-nasal drainage No sinus pain/pressure No itchy/red eyes ? left earache No hemoptysis No SOB + fever, + chills No nausea No vomiting No abdominal pain No diarrhea No urinary symptoms No skin rash + fatigue + myalgias + headache Used OTC meds without relief    Physical Exam Triage Vital Signs ED Triage Vitals  Enc Vitals Group     BP 04/16/16 1451 125/86     Pulse Rate 04/16/16 1451 72     Resp 04/16/16 1451 18     Temp 04/16/16 1451 97.6 F (36.4 C)     Temp Source 04/16/16 1451 Oral     SpO2 04/16/16 1451 96 %     Weight 04/16/16 1451 (!) 335 lb (152 kg)     Height 04/16/16 1451 5' 5"  (1.651 m)     Head Circumference --      Peak Flow --      Pain Score 04/16/16 1453 0     Pain Loc --      Pain Edu? --      Excl. in [place]? --    No data found.   Updated Vital Signs BP 125/86 (BP Location: Left Arm)   Pulse 72   Temp 97.6 F (36.4 C) (Oral)   Resp 18   Ht 5' 5"  (1.651 m)   Wt (!) 335 lb (152 kg)   LMP 04/08/2016   SpO2 96%   BMI 55.75 kg/m   Visual Acuity Right Eye Distance:   Left Eye Distance:   Bilateral Distance:    Right Eye Near:   Left Eye Near:    Bilateral Near:     Physical Exam Nursing notes and Vital Signs reviewed. Appearance:  Patient appears stated age, and in no acute distress Eyes:  Pupils are equal, round, and reactive to light and accomodation.  Extraocular movement is intact.  Conjunctivae are not inflamed  Ears:  Canals normal.  Tympanic membranes normal.  Nose:   Congested turbinates.  No sinus tenderness.   Pharynx:  Normal Neck:  Supple.  Tender enlarged posterior/lateral nodes are palpated bilaterally  Lungs:  Clear to auscultation.  Breath sounds are equal.  Moving air well. Heart:  Regular rate and rhythm without murmurs, rubs, or gallops.  Abdomen:  Nontender without masses or hepatosplenomegaly.  Bowel sounds are present.  No CVA or flank tenderness.  Extremities:  No edema.  Skin:  No rash present.    UC Treatments / Results  Labs (all labs ordered are listed, but only abnormal results are displayed) Labs Reviewed - No data to display  EKG  EKG Interpretation None       Radiology No results found.  Procedures Procedures (including critical care time)  Medications Ordered in UC Medications - No data to display   Initial Impression / Assessment and Plan / UC Course  I have reviewed the triage vital signs and the nursing notes.  Pertinent labs & imaging results that were available during my care of the patient were reviewed by me and considered in my medical decision making (see chart for details).  Clinical Course   There is no evidence of bacterial infection today.   Rx for Robitussin AC for night time cough.  Take plain guaifenesin (1235m extended release tabs such as Mucinex) twice daily, with plenty of water, for cough and congestion. Get adequate rest.   May use Afrin nasal spray (or generic oxymetazoline) twice daily for about 5 days and then discontinue.  Also recommend using saline nasal spray several times daily and saline nasal irrigation (AYR is a common brand).  Use Flonase nasal spray each morning after using Afrin nasal spray and saline nasal irrigation. Try warm salt water gargles for sore throat.  Stop all antihistamines for now, and other non-prescription cough/cold preparations. Begin doxycycline if not improving about one week or if persistent  fever develops (Given a prescription to hold, with an expiration date)  Follow-up with family doctor if not improving about10 days.     Final Clinical Impressions(s) / UC Diagnoses   Final diagnoses:  Viral URI with cough    New Prescriptions New Prescriptions   DOXYCYCLINE (VIBRAMYCIN) 100 MG CAPSULE    Take 1 capsule (100 mg total) by mouth 2 (two) times daily. Take with food (Rx void after 04/24/16)   GUAIFENESIN-CODEINE 100-10 MG/5ML SYRUP    Take 13mby mouth at bedtime as needed for cough     StKandra NicolasMD 05/03/16 1521

## 2016-04-26 ENCOUNTER — Other Ambulatory Visit: Payer: Self-pay | Admitting: Emergency Medicine

## 2016-04-26 MED ORDER — PAROXETINE HCL 20 MG PO TABS: 20.0000 mg | ORAL_TABLET | Freq: Every day | ORAL | 0 refills | Status: DC

## 2016-04-27 ENCOUNTER — Encounter (HOSPITAL_COMMUNITY)
Admission: RE | Admit: 2016-04-27 | Discharge: 2016-04-27 | Disposition: A | Discharge status: Home / Self Care | Payer: BLUE CROSS/BLUE SHIELD | Source: Ambulatory Visit | Attending: Physician Assistant | Admitting: Physician Assistant

## 2016-04-27 ENCOUNTER — Encounter (HOSPITAL_COMMUNITY): Payer: Self-pay

## 2016-04-27 ENCOUNTER — Inpatient Hospital Stay (HOSPITAL_COMMUNITY): Admission: RE | Admit: 2016-04-27 | Payer: BLUE CROSS/BLUE SHIELD | Source: Ambulatory Visit

## 2016-04-27 DIAGNOSIS — R072 Precordial pain: Secondary | ICD-10-CM | POA: Diagnosis not present

## 2016-04-27 MED ORDER — REGADENOSON 0.4 MG/5ML IV SOLN
INTRAVENOUS | Status: AC
  Administered 2016-04-27: 0.4 mg via INTRAVENOUS
  Filled 2016-04-27: qty 5

## 2016-04-27 MED ORDER — TECHNETIUM TC 99M TETROFOSMIN IV KIT
30.0000 | PACK | Freq: Once | INTRAVENOUS | Status: AC | PRN
  Administered 2016-04-27: 30 via INTRAVENOUS

## 2016-04-27 MED ORDER — SODIUM CHLORIDE 0.9% FLUSH
INTRAVENOUS | Status: AC
  Administered 2016-04-27: 10 mL via INTRAVENOUS
  Filled 2016-04-27: qty 10

## 2016-04-28 ENCOUNTER — Encounter (HOSPITAL_COMMUNITY)
Admission: RE | Admit: 2016-04-28 | Discharge: 2016-04-28 | Disposition: A | Discharge status: Home / Self Care | Payer: BLUE CROSS/BLUE SHIELD | Source: Ambulatory Visit | Attending: Physician Assistant | Admitting: Physician Assistant

## 2016-04-28 ENCOUNTER — Ambulatory Visit: Payer: BLUE CROSS/BLUE SHIELD | Admitting: Cardiovascular Disease

## 2016-04-28 DIAGNOSIS — R072 Precordial pain: Secondary | ICD-10-CM | POA: Diagnosis not present

## 2016-04-28 LAB — NM MYOCAR MULTI W/SPECT W/WALL MOTION / EF
LV dias vol: 87 mL (ref 46–106)
LV sys vol: 30 mL
RATE: 0.3
SDS: 0
SRS: 3
SSS: 3
TID: 0.8

## 2016-04-28 MED ORDER — TECHNETIUM TC 99M TETROFOSMIN IV KIT
25.0000 | PACK | Freq: Once | INTRAVENOUS | Status: AC | PRN
  Administered 2016-04-28: 27 via INTRAVENOUS

## 2016-05-04 ENCOUNTER — Other Ambulatory Visit: Payer: Self-pay | Admitting: Family Medicine

## 2016-05-04 ENCOUNTER — Ambulatory Visit (INDEPENDENT_AMBULATORY_CARE_PROVIDER_SITE_OTHER): Payer: BLUE CROSS/BLUE SHIELD

## 2016-05-04 ENCOUNTER — Telehealth: Payer: Self-pay

## 2016-05-04 ENCOUNTER — Encounter: Payer: Self-pay | Admitting: Gastroenterology

## 2016-05-04 ENCOUNTER — Ambulatory Visit (INDEPENDENT_AMBULATORY_CARE_PROVIDER_SITE_OTHER): Payer: BLUE CROSS/BLUE SHIELD | Admitting: Gastroenterology

## 2016-05-04 VITALS — BP 116/78 | HR 68 | Ht 65.0 in | Wt 335.8 lb

## 2016-05-04 DIAGNOSIS — E538 Deficiency of other specified B group vitamins: Secondary | ICD-10-CM

## 2016-05-04 DIAGNOSIS — S39012A Strain of muscle, fascia and tendon of lower back, initial encounter: Secondary | ICD-10-CM

## 2016-05-04 DIAGNOSIS — K219 Gastro-esophageal reflux disease without esophagitis: Secondary | ICD-10-CM | POA: Diagnosis not present

## 2016-05-04 MED ORDER — PANTOPRAZOLE SODIUM 40 MG PO TBEC: 40.0000 mg | DELAYED_RELEASE_TABLET | Freq: Every day | ORAL | 1 refills | Status: DC

## 2016-05-04 MED ORDER — CYANOCOBALAMIN 1000 MCG/ML IJ SOLN
1000.0000 ug | Freq: Once | INTRAMUSCULAR | Status: AC
  Administered 2016-05-04: 1000 ug via INTRAMUSCULAR

## 2016-05-04 NOTE — Progress Notes (Signed)
[name]    [id number]    [birth year]  Primary Care Physician:Stacy Lorretta Harp, MD  Referring Physician: Binnie Rail, MD [place], [place] [id number]  Chief complaint:  LUQ abdominal pain HPI: 46 year old female with with history of long-standing GERD here for follow-up visit. She was last seen in July 2017. Patient is currently taking Protonix daily and has helped with her symptoms including heartburn. Denies any dysphagia, odynophagia, nausea or vomiting. Patient complains of intermittent left upper quadrant abdominal pain that runs down through her back. She is having regular bowel movements every day . She is no longer taking Linzess. Has had multiple imaging in the past that were unremarkable with no significant pathology. EGD in 2010 showed mild esophagitis and colonoscopy in 2010 showed left-sided diverticulosis otherwise normal.  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 Patient feels her excessive weight gain and obesity could be related to hormonal imbalance. She has history of polycystic ovarian syndrome but patient thinks probably there is something else causing her symptoms and weight gain.    Outpatient Encounter Prescriptions as of 05/04/2016  Medication Sig  . acyclovir ointment (ZOVIRAX) 5 % Apply 1 application topically every 3 (three) hours.  [name] albuterol (PROAIR HFA) 108 (90 BASE) MCG/ACT inhaler Inhale 2 puffs into the lungs every 4 (four) hours as needed.  . ALPRAZolam (XANAX) 0.5 MG tablet Take 1 tablet (0.5 mg total) by mouth every 8 (eight) hours as needed for sleep or anxiety.  . Cholecalciferol (VITAMIN D) 2000 units tablet Take 2,000 Units by mouth daily.  . cyanocobalamin (,VITAMIN B-12,) 1000 MCG/ML injection Inject 1 mL (1,000 mcg total) into the muscle every 30 (thirty) days.  [name] doxycycline  (VIBRAMYCIN) 100 MG capsule Take 1 capsule (100 mg total) by mouth 2 (two) times daily. Take with food (Rx void after 04/24/16)  . guaiFENesin-codeine 100-10 MG/5ML syrup Take 29m by mouth at bedtime as needed for cough  . metoprolol tartrate (LOPRESSOR) 25 MG tablet Take 1 tablet (25 mg total) by mouth 2 (two) times daily.  .Marland KitchenOVER THE COUNTER MEDICATION Plexus Packets for drink  and pills  . pantoprazole (PROTONIX) 40 MG tablet Take 1 tablet (40 mg total) by mouth daily.  .Marland KitchenPARoxetine (PAXIL) 20 MG tablet Take 1 tablet (20 mg total) by mouth daily.  . valACYclovir (VALTREX) 1000 MG tablet Take two tablets ( total 2000 mg) by mouth q12h x 1 day; Start: ASAP after symptom onset   No facility-administered encounter medications on file as of 05/04/2016.     Allergies as of 05/04/2016  . (No Known Allergies)    Past Medical History:  Diagnosis Date  . Anxiety   . ASTHMA   . DELAYED GASTRIC EMPTYING   . DEPRESSION   . GERD   . OBESITY   . OBSTRUCTIVE SLEEP APNEA   . POLYCYSTIC OVARIAN DISEASE   . SMOKER   . Tachycardia   . Umbilical hernia   . Umbilical hernia   . Vitamin B12 deficiency 03/2015 dx   start IM replacement q 30d  . Vitamin D deficiency 03/2015 dx    Past Surgical History:  Procedure Laterality Date  . CESAREAN SECTION  97 & 98   x's 2  . Cocxyl removal  1995  .  DILATION AND CURETTAGE OF UTERUS    . LAPAROSCOPY    . TUBAL LIGATION  1998    Family History  Problem Relation Age of Onset  . Breast cancer Mother     dx 11/2009  . Diabetes Maternal Aunt   . Prostate cancer Maternal Grandfather 14    also hx colon polyps  . Heart disease Paternal Grandmother   . Pancreatic cancer Paternal Grandmother 70  . Heart murmur Sister   . Adrenal disorder Neg Hx     Social History   Social History  . Marital status: Married    Spouse name: N/A  . Number of children: 2  . Years of education: N/A   Occupational History  .   [employer]    CNA    Social History Main Topics  . Smoking status: Current Some Day Smoker    Packs/day: 0.50    Types: Cigarettes    Start date: 10/23/1987  . Smokeless tobacco: Never Used  . Alcohol use 0.0 oz/week     Comment: Rarely  . Drug use: No  . Sexual activity: Not on file   Other Topics Concern  . Not on file   Social History Narrative   Lives with husband & 2 kids from 1st marriage. (1st husband expired 1998-leukemia) work as Merchandiser, retail, but currently unemployed      Review of systems: Review of Systems  Constitutional: Negative for fever and chills.  HENT: Negative.   Eyes: Negative for blurred vision.  Respiratory: Negative for cough, shortness of breath and wheezing.   Cardiovascular: Negative for chest pain and palpitations.  Gastrointestinal: as per HPI Genitourinary: Negative for dysuria, urgency, frequency and hematuria.  Musculoskeletal: Negative for myalgias, back pain and joint pain.  Skin: Negative for itching and rash.  Neurological: Negative for dizziness, tremors, focal weakness, seizures and loss of consciousness.  Endo/Heme/Allergies: Positive for seasonal allergies.  Psychiatric/Behavioral: Negative for depression, suicidal ideas and hallucinations.  All other systems reviewed and are negative.   Physical Exam: Vitals:   05/04/16 1012  BP: 116/78  Pulse: 68   Body mass index is 55.88 kg/m. Gen:      No acute distress HEENT:  EOMI, sclera anicteric Neck:     No masses; no thyromegaly Lungs:    Clear to auscultation bilaterally; normal respiratory effort CV:         Regular rate and rhythm; no murmurs Abd:      + bowel sounds; soft, non-tender; no palpable masses, no distension Ext:    No edema; adequate peripheral perfusion Skin:      Warm and dry; no rash Neuro: alert and oriented x 3 Psych: normal mood and affect  Data Reviewed:  Reviewed labs, radiology imaging, old records and pertinent past GI work up   Assessment and  Plan/Recommendations: 46 year old female morbid obesity and chronic GERD here for follow-up visit GERD symptoms currently well controlled with Protonix daily Continue antireflux measures Left upper quadrant pain radiating down the pack likely musculoskeletal in nature She had recent imaging last month with no significant abnormality, no further testing  Patient is concerned about hormonal imbalance is causing excessive weight gain and obesity Advised her to follow up with GYN for PCOS  25 minutes was spent face-to-face with the patient. Greater than 50% of the time used for counseling as well as treatment plan and follow-up. She had multiple questions which were answered to her satisfaction  K. Denzil Magnuson , MD [phone]  Mon-Fri 8a-5p [phone] after 5p, weekends, holidays  CC: Burns, Claudina Lick, MD

## 2016-05-04 NOTE — Patient Instructions (Signed)
We have sent in Protonix refills to your pharmacy  Follow up with your Gynecologist   Follow up  With Dr Silverio Decamp as needed

## 2016-05-04 NOTE — Progress Notes (Signed)
Injection given.   Binnie Rail, MD

## 2016-05-04 NOTE — Telephone Encounter (Signed)
Patient coming in for b12 nurse visit today, ok to get b12 but patient needs to make appt for physical to see dr burns---adding to nurse note

## 2016-05-05 ENCOUNTER — Other Ambulatory Visit: Payer: Self-pay | Admitting: Family Medicine

## 2016-05-05 DIAGNOSIS — S39012A Strain of muscle, fascia and tendon of lower back, initial encounter: Secondary | ICD-10-CM

## 2016-05-13 ENCOUNTER — Other Ambulatory Visit: Payer: Self-pay | Admitting: Internal Medicine

## 2016-05-13 ENCOUNTER — Ambulatory Visit: Payer: BLUE CROSS/BLUE SHIELD | Admitting: Cardiovascular Disease

## 2016-05-14 NOTE — Telephone Encounter (Signed)
Not sure if I have prescribed this for her in the past or not.  I know Dr Asa Lente did.  She has an appt 2/14 - we can discuss then.

## 2016-05-25 ENCOUNTER — Encounter: Payer: Self-pay | Admitting: Internal Medicine

## 2016-05-25 ENCOUNTER — Other Ambulatory Visit (INDEPENDENT_AMBULATORY_CARE_PROVIDER_SITE_OTHER): Payer: BLUE CROSS/BLUE SHIELD

## 2016-05-25 ENCOUNTER — Ambulatory Visit (INDEPENDENT_AMBULATORY_CARE_PROVIDER_SITE_OTHER): Payer: BLUE CROSS/BLUE SHIELD | Admitting: Internal Medicine

## 2016-05-25 VITALS — BP 112/80 | HR 84 | Temp 97.7°F | Resp 18 | Ht 65.0 in | Wt 336.0 lb

## 2016-05-25 DIAGNOSIS — E538 Deficiency of other specified B group vitamins: Secondary | ICD-10-CM | POA: Diagnosis not present

## 2016-05-25 DIAGNOSIS — F32A Depression, unspecified: Secondary | ICD-10-CM

## 2016-05-25 DIAGNOSIS — E559 Vitamin D deficiency, unspecified: Secondary | ICD-10-CM

## 2016-05-25 DIAGNOSIS — R7302 Impaired glucose tolerance (oral): Secondary | ICD-10-CM

## 2016-05-25 DIAGNOSIS — Z114 Encounter for screening for human immunodeficiency virus [HIV]: Secondary | ICD-10-CM

## 2016-05-25 DIAGNOSIS — F419 Anxiety disorder, unspecified: Secondary | ICD-10-CM

## 2016-05-25 DIAGNOSIS — Z0000 Encounter for general adult medical examination without abnormal findings: Secondary | ICD-10-CM

## 2016-05-25 DIAGNOSIS — F329 Major depressive disorder, single episode, unspecified: Secondary | ICD-10-CM

## 2016-05-25 LAB — COMPREHENSIVE METABOLIC PANEL
ALT: 12 U/L (ref 0–35)
AST: 6 U/L (ref 0–37)
Albumin: 4.1 g/dL (ref 3.5–5.2)
Alkaline Phosphatase: 77 U/L (ref 39–117)
BUN: 12 mg/dL (ref 6–23)
CO2: 27 mEq/L (ref 19–32)
Calcium: 9.3 mg/dL (ref 8.4–10.5)
Chloride: 107 mEq/L (ref 96–112)
Creatinine, Ser: 0.91 mg/dL (ref 0.40–1.20)
GFR: 71.25 mL/min (ref >60.00)
Glucose, Bld: 97 mg/dL (ref 70–99)
Potassium: 4 mEq/L (ref 3.5–5.1)
Sodium: 139 mEq/L (ref 135–145)
Total Bilirubin: 0.4 mg/dL (ref 0.2–1.2)
Total Protein: 7.4 g/dL (ref 6.0–8.3)

## 2016-05-25 LAB — LIPID PANEL
Cholesterol: 200 mg/dL (ref 0–200)
HDL: 38.9 mg/dL — ABNORMAL LOW (ref >39.00)
LDL Cholesterol: 133 mg/dL — ABNORMAL HIGH (ref 0–99)
NonHDL: 160.69
Total CHOL/HDL Ratio: 5
Triglycerides: 136 mg/dL (ref 0.0–149.0)
VLDL: 27.2 mg/dL (ref 0.0–40.0)

## 2016-05-25 LAB — CBC WITH DIFFERENTIAL/PLATELET
Basophils Absolute: 0.1 10*3/uL (ref 0.0–0.1)
Basophils Relative: 0.8 % (ref 0.0–3.0)
Eosinophils Absolute: 0.2 10*3/uL (ref 0.0–0.7)
Eosinophils Relative: 1.7 % (ref 0.0–5.0)
HCT: 37.7 % (ref 36.0–46.0)
Hemoglobin: 12.5 g/dL (ref 12.0–15.0)
Lymphocytes Relative: 29.4 % (ref 12.0–46.0)
Lymphs Abs: 2.6 10*3/uL (ref 0.7–4.0)
MCHC: 33.3 g/dL (ref 30.0–36.0)
MCV: 90.4 fl (ref 78.0–100.0)
Monocytes Absolute: 0.4 10*3/uL (ref 0.1–1.0)
Monocytes Relative: 4.5 % (ref 3.0–12.0)
Neutro Abs: 5.7 10*3/uL (ref 1.4–7.7)
Neutrophils Relative %: 63.6 % (ref 43.0–77.0)
Platelets: 360 10*3/uL (ref 150.0–400.0)
RBC: 4.17 Mil/uL (ref 3.87–5.11)
RDW: 13.6 % (ref 11.5–15.5)
WBC: 9 10*3/uL (ref 4.0–10.5)

## 2016-05-25 LAB — VITAMIN D 25 HYDROXY (VIT D DEFICIENCY, FRACTURES): VITD: 24.1 ng/mL — ABNORMAL LOW (ref 30.00–100.00)

## 2016-05-25 LAB — VITAMIN B12: Vitamin B-12: 556 pg/mL (ref 211–911)

## 2016-05-25 LAB — TSH: TSH: 2.53 u[IU]/mL (ref 0.35–4.50)

## 2016-05-25 LAB — HEMOGLOBIN A1C: Hgb A1c MFr Bld: 5.9 % (ref 4.6–6.5)

## 2016-05-25 MED ORDER — PAROXETINE HCL 20 MG PO TABS: 20.0000 mg | ORAL_TABLET | Freq: Every day | ORAL | 1 refills | Status: DC

## 2016-05-25 NOTE — Progress Notes (Signed)
Pre visit review using our clinic review tool, if applicable. No additional management support is needed unless otherwise documented below in the visit note. 

## 2016-05-25 NOTE — Assessment & Plan Note (Signed)
Discussed weight loss and decreased portions at length Discussed weight loss medications - I do not feel phentermine is a good option for her Discussed other options - she will call her insurance company and see what medication are covered and we can decide after that

## 2016-05-25 NOTE — Assessment & Plan Note (Signed)
Check vitamin D level

## 2016-05-25 NOTE — Progress Notes (Signed)
Subjective:    Patient ID: [name], female    DOB: 22-Sep-1970, 46 y.o.   MRN: [id number]  HPI She is here for a physical exam.   She has taken phentermine in the past and did ok with it. She is interested in trying it again.  She has never taken anything else. She wants to lose weight and is trying, but has not had success.  She is walking 5 days a week, she walks two miles.  She will start swimming soon and once she feels more comfortable use the treadmill at the gym.  She has decreased her portions and feels she is eating more healthy. She does not watch her sodium intake. She is eating take-out food for breakfast and lunch.    Medications and allergies reviewed with patient and updated if appropriate.  Patient Active Problem List   Diagnosis Date Noted  . Family history of early CAD 03/23/2016  . SVT (supraventricular tachycardia) ([place]) 03/14/2016  . Adrenal nodule ([place]) 01/06/2016  . Nausea without vomiting 12/02/2015  . 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 Umbilical hernia 40/98/1191  . Glucose intolerance (impaired glucose tolerance) 04/09/2015  . Morbid obesity ([place]) 04/09/2015  . Vitamin D deficiency   . Vitamin B12 deficiency   . Anxiety   . Tobacco abuse 01/28/2009  . Depression 01/28/2009  . GERD 01/28/2009  . POLYCYSTIC OVARIAN DISEASE 02/10/2007  . Obstructive sleep apnea 02/10/2007  . Asthma 02/10/2007    Current Outpatient Prescriptions on File Prior to Visit  Medication Sig Dispense Refill  . acyclovir ointment (ZOVIRAX) 5 % Apply 1 application topically every 3 (three) hours. 30 g 5  . albuterol (PROAIR HFA) 108 (90 BASE) MCG/ACT inhaler Inhale 2 puffs into the lungs every 4 (four) hours as needed. 18 g 2  . ALPRAZolam (XANAX) 0.5 MG tablet Take 1 tablet (0.5 mg total) by mouth every 8 (eight) hours as needed for sleep or anxiety. 30 tablet 1  . Cholecalciferol (VITAMIN D) 2000 units tablet Take 2,000 Units by mouth daily.    . cyanocobalamin  (,VITAMIN B-12,) 1000 MCG/ML injection Inject 1 mL (1,000 mcg total) into the muscle every 30 (thirty) days. 1 mL 0  . metoprolol tartrate (LOPRESSOR) 25 MG tablet Take 1 tablet (25 mg total) by mouth 2 (two) times daily. 180 tablet 3  . OVER THE COUNTER MEDICATION Plexus Packets for drink  and pills    . pantoprazole (PROTONIX) 40 MG tablet Take 1 tablet (40 mg total) by mouth daily. 30 tablet 1  . valACYclovir (VALTREX) 1000 MG tablet Take two tablets ( total 2000 mg) by mouth q12h x 1 day; Start: ASAP after symptom onset 6 tablet 2   No current facility-administered medications on file prior to visit.     Past Medical History:  Diagnosis Date  . Anxiety   . ASTHMA   . DELAYED GASTRIC EMPTYING   . DEPRESSION   . GERD   . OBESITY   . OBSTRUCTIVE SLEEP APNEA   . POLYCYSTIC OVARIAN DISEASE   . SMOKER   . Tachycardia   . Umbilical hernia   . Umbilical hernia   . Vitamin B12 deficiency 03/2015 dx   start IM replacement q 30d  . Vitamin D deficiency 03/2015 dx    Past Surgical History:  Procedure Laterality Date  . CESAREAN SECTION  97 & 98   x's 2  . Cocxyl removal  1995  . DILATION AND CURETTAGE OF UTERUS    .  LAPAROSCOPY    . TUBAL LIGATION  1998    Social History   Social History  . Marital status: Married    Spouse name: N/A  . Number of children: 2  . Years of education: N/A   Occupational History  .   [employer]    CNA   Social History Main Topics  . Smoking status: Current Some Day Smoker    Packs/day: 0.50    Types: Cigarettes    Start date: 10/23/1987  . Smokeless tobacco: Never Used  . Alcohol use 0.0 oz/week     Comment: Rarely  . Drug use: No  . Sexual activity: Not on file   Other Topics Concern  . Not on file   Social History Narrative   Lives with husband & 2 kids from 1st marriage. (1st husband expired 1998-leukemia) work as Merchandiser, retail, but currently unemployed    Family History  Problem Relation Age of Onset  .  Breast cancer Mother     dx 11/2009  . Diabetes Maternal Aunt   . Prostate cancer Maternal Grandfather 45    also hx colon polyps  . Heart disease Paternal Grandmother   . Pancreatic cancer Paternal Grandmother 4  . Heart murmur Sister   . Adrenal disorder Neg Hx     Review of Systems  Constitutional: Negative for chills and fever.  Eyes: Negative for visual disturbance.  Respiratory: Negative for cough, shortness of breath and wheezing.   Cardiovascular: Negative for chest pain, palpitations and leg swelling.  Gastrointestinal: Positive for abdominal pain (LLQ - has seen GI). Negative for blood in stool, constipation, diarrhea and nausea.       Gerd controlled  Genitourinary: Negative for dysuria and hematuria.  Musculoskeletal: Positive for arthralgias.  Skin: Positive for color change (mole ? abnormal). Negative for rash.  Neurological: Positive for light-headedness (when changing positions) and headaches (occasional).  Psychiatric/Behavioral: Negative for dysphoric mood. The patient is nervous/anxious (controlled most of the time).        Objective:   Vitals:   05/25/16 1024  BP: 112/80  Pulse: 84  Resp: 18  Temp: 97.7 F (36.5 C)   Filed Weights   05/25/16 1024  Weight: (!) 336 lb (152.4 kg)   Body mass index is 55.91 kg/m.  Wt Readings from Last 3 Encounters:  05/25/16 (!) 336 lb (152.4 kg)  05/04/16 (!) 335 lb 12.8 oz (152.3 kg)  04/16/16 (!) 335 lb (152 kg)     Physical Exam Constitutional: She appears well-developed and well-nourished. No distress.  HENT:  Head: Normocephalic and atraumatic.  Right Ear: External ear normal. Normal ear canal and TM Left Ear: External ear normal.  Normal ear canal and TM Mouth/Throat: Oropharynx is clear and moist.  Eyes: Conjunctivae and EOM are normal.  Neck: Neck supple. No tracheal deviation present. No thyromegaly present.  No carotid bruit  Cardiovascular: Normal rate, regular rhythm and normal heart sounds.     No murmur heard.  No edema. Pulmonary/Chest: Effort normal and breath sounds normal. No respiratory distress. She has no wheezes. She has no rales.  Breast: deferred to Gyn Abdominal: Soft. She exhibits no distension. There is no tenderness.  Lymphadenopathy: She has no cervical adenopathy.  Skin: Skin is warm and dry. She is not diaphoretic.  Psychiatric: She has a normal mood and affect. Her behavior is normal.         Assessment & Plan:   Physical exam: Screening blood work  ordered Immunizations    Up to date  Mammogram   will schedule Gyn  Up to date  Eye exams  Up to date  Exercise - exercising regularly-advised her to increase the amount she is exercising Weight  discussed weight loss at length. Discussed the importance of exercising and increasing what she is currently doing. Discussed small portions and decreasing her calorie intake further. Also advised her to monitor her sodium intake, which will cause fluid retention. Discussed seeing a nutritionist-she deferred. Discussed weight loss medications Skin  - looked at moles on her back-normal-appearing Substance abuse  Stressed smoking cessation  See Problem List for Assessment and Plan of chronic medical problems.

## 2016-05-25 NOTE — Patient Instructions (Signed)
Test(s) ordered today. Your results will be released to [facility] (or called to you) after review, usually within 72hours after test completion. If any changes need to be made, you will be notified at that same time.  All other Health Maintenance issues reviewed.   All recommended immunizations and age-appropriate screenings are up-to-date or discussed.  No immunizations administered today.   Medications reviewed and updated.  No changes recommended at this time.   Please followup in 6 months, sooner if we start a new weight loss medication   Health Maintenance, Female Introduction Adopting a healthy lifestyle and getting preventive care can go a long way to promote health and wellness. Talk with your health care provider about what schedule of regular examinations is right for you. This is a good chance for you to check in with your provider about disease prevention and staying healthy. In between checkups, there are plenty of things you can do on your own. Experts have done a lot of research about which lifestyle changes and preventive measures are most likely to keep you healthy. Ask your health care provider for more information. Weight and diet Eat a healthy diet  Be sure to include plenty of vegetables, fruits, low-fat dairy products, and lean protein.  Do not eat a lot of foods high in solid fats, added sugars, or salt.  Get regular exercise. This is one of the most important things you can do for your health.  Most adults should exercise for at least 150 minutes each week. The exercise should increase your heart rate and make you sweat (moderate-intensity exercise).  Most adults should also do strengthening exercises at least twice a week. This is in addition to the moderate-intensity exercise. Maintain a healthy weight  Body mass index (BMI) is a measurement that can be used to identify possible weight problems. It estimates body fat based on height and weight. Your health care  provider can help determine your BMI and help you achieve or maintain a healthy weight.  For females 54 years of age and older:  A BMI below 18.5 is considered underweight.  A BMI of 18.5 to 24.9 is normal.  A BMI of 25 to 29.9 is considered overweight.  A BMI of 30 and above is considered obese. Watch levels of cholesterol and blood lipids  You should start having your blood tested for lipids and cholesterol at 45 years of age, then have this test every 5 years.  You may need to have your cholesterol levels checked more often if:  Your lipid or cholesterol levels are high.  You are older than 45 years of age.  You are at high risk for heart disease. Cancer screening Lung Cancer  Lung cancer screening is recommended for adults 50-46 years old who are at high risk for lung cancer because of a history of smoking.  A yearly low-dose CT scan of the lungs is recommended for people who:  Currently smoke.  Have quit within the past 15 years.  Have at least a 30-pack-year history of smoking. A pack year is smoking an average of one pack of cigarettes a day for 1 year.  Yearly screening should continue until it has been 15 years since you quit.  Yearly screening should stop if you develop a health problem that would prevent you from having lung cancer treatment. Breast Cancer  Practice breast self-awareness. This means understanding how your breasts normally appear and feel.  It also means doing regular breast self-exams. Let your health  care provider know about any changes, no matter how small.  If you are in your 20s or 30s, you should have a clinical breast exam (CBE) by a health care provider every 1-3 years as part of a regular health exam.  If you are 44 or older, have a CBE every year. Also consider having a breast X-ray (mammogram) every year.  If you have a family history of breast cancer, talk to your health care provider about genetic screening.  If you are at high  risk for breast cancer, talk to your health care provider about having an MRI and a mammogram every year.  Breast cancer gene (BRCA) assessment is recommended for women who have family members with BRCA-related cancers. BRCA-related cancers include:  Breast.  Ovarian.  Tubal.  Peritoneal cancers.  Results of the assessment will determine the need for genetic counseling and BRCA1 and BRCA2 testing. Cervical Cancer  Your health care provider may recommend that you be screened regularly for cancer of the pelvic organs (ovaries, uterus, and vagina). This screening involves a pelvic examination, including checking for microscopic changes to the surface of your cervix (Pap test). You may be encouraged to have this screening done every 3 years, beginning at age 59.  For women ages 27-65, health care providers may recommend pelvic exams and Pap testing every 3 years, or they may recommend the Pap and pelvic exam, combined with testing for human papilloma virus (HPV), every 5 years. Some types of HPV increase your risk of cervical cancer. Testing for HPV may also be done on women of any age with unclear Pap test results.  Other health care providers may not recommend any screening for nonpregnant women who are considered low risk for pelvic cancer and who do not have symptoms. Ask your health care provider if a screening pelvic exam is right for you.  If you have had past treatment for cervical cancer or a condition that could lead to cancer, you need Pap tests and screening for cancer for at least 20 years after your treatment. If Pap tests have been discontinued, your risk factors (such as having a new sexual partner) need to be reassessed to determine if screening should resume. Some women have medical problems that increase the chance of getting cervical cancer. In these cases, your health care provider may recommend more frequent screening and Pap tests. Colorectal Cancer  This type of cancer can  be detected and often prevented.  Routine colorectal cancer screening usually begins at 45 years of age and continues through 45 years of age.  Your health care provider may recommend screening at an earlier age if you have risk factors for colon cancer.  Your health care provider may also recommend using home test kits to check for hidden blood in the stool.  A small camera at the end of a tube can be used to examine your colon directly (sigmoidoscopy or colonoscopy). This is done to check for the earliest forms of colorectal cancer.  Routine screening usually begins at age 20.  Direct examination of the colon should be repeated every 5-10 years through 45 years of age. However, you may need to be screened more often if early forms of precancerous polyps or small growths are found. Skin Cancer  Check your skin from head to toe regularly.  Tell your health care provider about any new moles or changes in moles, especially if there is a change in a mole's shape or color.  Also tell your health  care provider if you have a mole that is larger than the size of a pencil eraser.  Always use sunscreen. Apply sunscreen liberally and repeatedly throughout the day.  Protect yourself by wearing long sleeves, pants, a wide-brimmed hat, and sunglasses whenever you are outside. Heart disease, diabetes, and high blood pressure  High blood pressure causes heart disease and increases the risk of stroke. High blood pressure is more likely to develop in:  People who have blood pressure in the high end of the normal range (130-139/85-89 mm Hg).  People who are overweight or obese.  People who are African American.  If you are 6-52 years of age, have your blood pressure checked every 3-5 years. If you are 27 years of age or older, have your blood pressure checked every year. You should have your blood pressure measured twice-once when you are at a hospital or clinic, and once when you are not at a  hospital or clinic. Record the average of the two measurements. To check your blood pressure when you are not at a hospital or clinic, you can use:  An automated blood pressure machine at a pharmacy.  A home blood pressure monitor.  If you are between 15 years and 88 years old, ask your health care provider if you should take aspirin to prevent strokes.  Have regular diabetes screenings. This involves taking a blood sample to check your fasting blood sugar level.  If you are at a normal weight and have a low risk for diabetes, have this test once every three years after 45 years of age.  If you are overweight and have a high risk for diabetes, consider being tested at a younger age or more often. Preventing infection Hepatitis B  If you have a higher risk for hepatitis B, you should be screened for this virus. You are considered at high risk for hepatitis B if:  You were born in a country where hepatitis B is common. Ask your health care provider which countries are considered high risk.  Your parents were born in a high-risk country, and you have not been immunized against hepatitis B (hepatitis B vaccine).  You have HIV or AIDS.  You use needles to inject street drugs.  You live with someone who has hepatitis B.  You have had sex with someone who has hepatitis B.  You get hemodialysis treatment.  You take certain medicines for conditions, including cancer, organ transplantation, and autoimmune conditions. Hepatitis C  Blood testing is recommended for:  Everyone born from [birth year] through 1964.  Anyone with known risk factors for hepatitis C. Sexually transmitted infections (STIs)  You should be screened for sexually transmitted infections (STIs) including gonorrhea and chlamydia if:  You are sexually active and are younger than 45 years of age.  You are older than 45 years of age and your health care provider tells you that you are at risk for this type of  infection.  Your sexual activity has changed since you were last screened and you are at an increased risk for chlamydia or gonorrhea. Ask your health care provider if you are at risk.  If you do not have HIV, but are at risk, it may be recommended that you take a prescription medicine daily to prevent HIV infection. This is called pre-exposure prophylaxis (PrEP). You are considered at risk if:  You are sexually active and do not regularly use condoms or know the HIV status of your partner(s).  You take drugs by  injection.  You are sexually active with a partner who has HIV. Talk with your health care provider about whether you are at high risk of being infected with HIV. If you choose to begin PrEP, you should first be tested for HIV. You should then be tested every 3 months for as long as you are taking PrEP. Pregnancy  If you are premenopausal and you may become pregnant, ask your health care provider about preconception counseling.  If you may become pregnant, take 400 to 800 micrograms (mcg) of folic acid every day.  If you want to prevent pregnancy, talk to your health care provider about birth control (contraception). Osteoporosis and menopause  Osteoporosis is a disease in which the bones lose minerals and strength with aging. This can result in serious bone fractures. Your risk for osteoporosis can be identified using a bone density scan.  If you are 103 years of age or older, or if you are at risk for osteoporosis and fractures, ask your health care provider if you should be screened.  Ask your health care provider whether you should take a calcium or vitamin D supplement to lower your risk for osteoporosis.  Menopause may have certain physical symptoms and risks.  Hormone replacement therapy may reduce some of these symptoms and risks. Talk to your health care provider about whether hormone replacement therapy is right for you. Follow these instructions at home:  Schedule  regular health, dental, and eye exams.  Stay current with your immunizations.  Do not use any tobacco products including cigarettes, chewing tobacco, or electronic cigarettes.  If you are pregnant, do not drink alcohol.  If you are breastfeeding, limit how much and how often you drink alcohol.  Limit alcohol intake to no more than 1 drink per day for nonpregnant women. One drink equals 12 ounces of beer, 5 ounces of wine, or 1 ounces of hard liquor.  Do not use street drugs.  Do not share needles.  Ask your health care provider for help if you need support or information about quitting drugs.  Tell your health care provider if you often feel depressed.  Tell your health care provider if you have ever been abused or do not feel safe at home. This information is not intended to replace advice given to you by your health care provider. Make sure you discuss any questions you have with your health care provider. Document Released: 10/11/2010 Document Revised: 09/03/2015 Document Reviewed: 12/30/2014  2017 Elsevier

## 2016-05-25 NOTE — Assessment & Plan Note (Signed)
Controlled, stable Continue current dose of medication

## 2016-05-25 NOTE — Assessment & Plan Note (Signed)
Check vitamin B12 level

## 2016-05-25 NOTE — Assessment & Plan Note (Signed)
Check a1c

## 2016-05-26 LAB — HIV ANTIBODY (ROUTINE TESTING W REFLEX): HIV 1&2 Ab, 4th Generation: NONREACTIVE

## 2016-05-28 ENCOUNTER — Encounter: Payer: Self-pay | Admitting: Internal Medicine

## 2016-06-02 ENCOUNTER — Encounter: Payer: Self-pay | Admitting: Pulmonary Disease

## 2016-06-02 ENCOUNTER — Ambulatory Visit (INDEPENDENT_AMBULATORY_CARE_PROVIDER_SITE_OTHER): Payer: BLUE CROSS/BLUE SHIELD | Admitting: Pulmonary Disease

## 2016-06-02 ENCOUNTER — Ambulatory Visit (INDEPENDENT_AMBULATORY_CARE_PROVIDER_SITE_OTHER): Payer: BLUE CROSS/BLUE SHIELD | Admitting: Emergency Medicine

## 2016-06-02 VITALS — BP 108/64 | HR 60 | Ht 65.0 in | Wt 337.6 lb

## 2016-06-02 DIAGNOSIS — G4733 Obstructive sleep apnea (adult) (pediatric): Secondary | ICD-10-CM

## 2016-06-02 DIAGNOSIS — E538 Deficiency of other specified B group vitamins: Secondary | ICD-10-CM

## 2016-06-02 DIAGNOSIS — Z6841 Body Mass Index (BMI) 40.0 and over, adult: Secondary | ICD-10-CM | POA: Diagnosis not present

## 2016-06-02 MED ORDER — CYANOCOBALAMIN 1000 MCG/ML IJ SOLN
1000.0000 ug | Freq: Once | INTRAMUSCULAR | Status: AC
  Administered 2016-06-02: 1000 ug via INTRAMUSCULAR

## 2016-06-02 NOTE — Patient Instructions (Signed)
Will arrange for home sleep study Will call to arrange for follow up after sleep study reviewed  

## 2016-06-02 NOTE — Progress Notes (Signed)
   Subjective:    Patient ID: [name], female    DOB: 1971-01-19, 46 y.o.   MRN: [id number]  HPI    Review of Systems  Constitutional: Positive for unexpected weight change. Negative for fever.  HENT: Positive for sore throat. Negative for congestion, dental problem, ear pain, nosebleeds, postnasal drip, rhinorrhea, sinus pressure, sneezing and trouble swallowing.   Eyes: Negative for redness and itching.  Respiratory: Positive for cough and shortness of breath. Negative for chest tightness and wheezing.   Cardiovascular: Positive for chest pain ( x 3+months, comes and goes, sees Cards) and palpitations. Negative for leg swelling.  Gastrointestinal: Negative for nausea and vomiting.  Genitourinary: Negative for dysuria.  Musculoskeletal: Negative for joint swelling.  Skin: Negative for rash.  Neurological: Negative for headaches.  Hematological: Does not bruise/bleed easily.  Psychiatric/Behavioral: Negative for dysphoric mood. The patient is not nervous/anxious.        Objective:   Physical Exam        Assessment & Plan:

## 2016-06-02 NOTE — Progress Notes (Signed)
Past Surgical History She  has a past surgical history that includes Cocxyl removal (1995); Dilation and curettage of uterus; Tubal ligation (1998); Cesarean section (97 & 98); and laparoscopy.  No Known Allergies  Family History Her family history includes Breast cancer in her mother; Diabetes in her maternal aunt; Heart disease in her paternal grandmother; Heart murmur in her sister; Pancreatic cancer (age of onset: 51) in her paternal grandmother; Prostate cancer (age of onset: 43) in her maternal grandfather.  Social History She  reports that she has been smoking Cigarettes.  She started smoking about 28 years ago. She has been smoking about 0.50 packs per day. She has never used smokeless tobacco. She reports that she drinks alcohol. She reports that she does not use drugs.  Review of systems Constitutional: Positive for unexpected weight change. Negative for fever.  HENT: Positive for sore throat. Negative for congestion, dental problem, ear pain, nosebleeds, postnasal drip, rhinorrhea, sinus pressure, sneezing and trouble swallowing.   Eyes: Negative for redness and itching.  Respiratory: Positive for cough and shortness of breath. Negative for chest tightness and wheezing.   Cardiovascular: Positive for chest pain ( x 3+months, comes and goes, sees Cards) and palpitations. Negative for leg swelling.  Gastrointestinal: Negative for nausea and vomiting.  Genitourinary: Negative for dysuria.  Musculoskeletal: Negative for joint swelling.  Skin: Negative for rash.  Neurological: Negative for headaches.  Hematological: Does not bruise/bleed easily.  Psychiatric/Behavioral: Negative for dysphoric mood. The patient is not nervous/anxious.     Current Outpatient Prescriptions on File Prior to Visit  Medication Sig  . acyclovir ointment (ZOVIRAX) 5 % Apply 1 application topically every 3 (three) hours.  [name] albuterol (PROAIR HFA) 108 (90 BASE) MCG/ACT inhaler Inhale 2 puffs into the lungs  every 4 (four) hours as needed.  . ALPRAZolam (XANAX) 0.5 MG tablet Take 1 tablet (0.5 mg total) by mouth every 8 (eight) hours as needed for sleep or anxiety.  . Cholecalciferol (VITAMIN D) 2000 units tablet Take 2,000 Units by mouth daily.  . cyanocobalamin (,VITAMIN B-12,) 1000 MCG/ML injection Inject 1 mL (1,000 mcg total) into the muscle every 30 (thirty) days.  . metoprolol tartrate (LOPRESSOR) 25 MG tablet Take 1 tablet (25 mg total) by mouth 2 (two) times daily.  [name] OVER THE COUNTER MEDICATION Plexus Packets for drink  and pills  . pantoprazole (PROTONIX) 40 MG tablet Take 1 tablet (40 mg total) by mouth daily.  [name] PARoxetine (PAXIL) 20 MG tablet Take 1 tablet (20 mg total) by mouth daily.  . valACYclovir (VALTREX) 1000 MG tablet Take two tablets ( total 2000 mg) by mouth q12h x 1 day; Start: ASAP after symptom onset   No current facility-administered medications on file prior to visit.     Chief Complaint  Patient presents with  . SLEEP CONSULT    Referred by [name] for sleep related issues. Previous sleep studies. Has had a CPAP in the past but was taken off d/t OSA not being present. Epworth Score: 11    Past medical history She  has a past medical history of Anxiety; ASTHMA; DELAYED GASTRIC EMPTYING; DEPRESSION; GERD; OBESITY; OBSTRUCTIVE SLEEP APNEA; POLYCYSTIC OVARIAN DISEASE; SMOKER; SVT (supraventricular tachycardia) ([place]); Tachycardia; Umbilical hernia; Umbilical hernia; Vitamin B12 deficiency (03/2015 dx); and Vitamin D deficiency (03/2015 dx).  Vital signs BP 108/64 (BP Location: Left Wrist, Cuff Size: Normal)   Pulse 60   Ht 5' 5"  (1.651 m)   Wt (!) 337 lb 9.6 oz (153.1 kg)  SpO2 95%   BMI 56.18 kg/m   History of Present Illness [name] is a 46 y.o. female for evaluation of sleep problems.  I saw her in 2010.  She had sleep study, but looked okay and CPAP was stopped.    Since then she has gained significant weight.  With weight change her husband  says her sleep has gotten worse.  She is snoring, and stops breathing.  She gets dry mouth and frequent dreams.  Sleeps better on her side.  She is tired all the time.  She can get extra sleep, but still feel tired.  She goes to sleep at 11 pm.  She falls asleep after an hour.  She wakes up some times to use the bathroom.  She gets out of bed at 6 am.  She feels tired in the morning.  She denies morning headache.  She does not use anything to help her fall sleep.  She drinks a 16 ounce cup of coffee in the morning.  She denies sleep walking, sleep talking, bruxism, or nightmares.  There is no history of restless legs.  She denies sleep hallucinations, sleep paralysis, or cataplexy.  The Epworth score is 11 out of 24.   Physical Exam:  General - No distress ENT - No sinus tenderness, no oral exudate, no LAN, no thyromegaly, TM clear, pupils equal/reactive, MP 3, triangular uvula Cardiac - s1s2 regular, no murmur, pulses symmetric Chest - No wheeze/rales/dullness, good air entry, normal respiratory excursion Back - No focal tenderness Abd - Soft, non-tender, no organomegaly, + bowel sounds Ext - No edema Neuro - Normal strength, cranial nerves intact Skin - No rashes Psych - Normal mood, and behavior  Discussion: She has snoring, sleep disruption, witnessed apnea, and daytime sleepiness.  Her BMI is > 35.  She has hx of HTN, depression, and prior history of sleep apnea.  I am concerned her sleep apnea has recurred with weight gain.  We discussed how sleep apnea can affect various health problems, including risks for hypertension, cardiovascular disease, and diabetes.  We also discussed how sleep disruption can increase risks for accidents, such as while driving.  Weight loss as a means of improving sleep apnea was also reviewed.  Additional treatment options discussed were CPAP therapy, oral appliance, and surgical intervention.   Assessment/plan:  Obstructive sleep apnea. - will arrange  for home sleep study  Obesity. - discussed importance of weight loss   Patient Instructions  Will arrange for home sleep study Will call to arrange for follow up after sleep study reviewed    Chesley Mires, M.D. Pager [phone] 06/02/2016, 12:26 PM

## 2016-06-19 ENCOUNTER — Encounter: Payer: Self-pay | Admitting: Internal Medicine

## 2016-06-23 DIAGNOSIS — G4733 Obstructive sleep apnea (adult) (pediatric): Secondary | ICD-10-CM | POA: Diagnosis not present

## 2016-06-23 MED ORDER — NALTREXONE-BUPROPION HCL ER 8-90 MG PO TB12: ORAL_TABLET | ORAL | 0 refills | Status: DC

## 2016-06-23 NOTE — Addendum Note (Signed)
Addended by: Binnie Rail on: 06/23/2016 12:26 PM   Modules accepted: Orders

## 2016-06-30 ENCOUNTER — Telehealth: Payer: Self-pay | Admitting: Pulmonary Disease

## 2016-06-30 ENCOUNTER — Ambulatory Visit (INDEPENDENT_AMBULATORY_CARE_PROVIDER_SITE_OTHER): Payer: BLUE CROSS/BLUE SHIELD | Admitting: General Practice

## 2016-06-30 DIAGNOSIS — E538 Deficiency of other specified B group vitamins: Secondary | ICD-10-CM

## 2016-06-30 DIAGNOSIS — G4733 Obstructive sleep apnea (adult) (pediatric): Secondary | ICD-10-CM

## 2016-06-30 MED ORDER — CYANOCOBALAMIN 1000 MCG/ML IJ SOLN
1000.0000 ug | Freq: Once | INTRAMUSCULAR | Status: AC
  Administered 2016-06-30: 1000 ug via INTRAMUSCULAR

## 2016-06-30 NOTE — Progress Notes (Signed)
Injection given.   Binnie Rail, MD

## 2016-06-30 NOTE — Telephone Encounter (Signed)
Spoke with pt, requesting results of home sleep test.  Pt turned in ALICE approx 1 week ago.  VS please advise on home sleep test results.  Thanks.

## 2016-07-01 NOTE — Telephone Encounter (Signed)
HST 06/23/16 >> AHI 99.9, SaO2 low 76%.   Will have my nurse inform pt that sleep study shows severe sleep apnea.  Options are 1) CPAP now, 2) ROV first.  If She is agreeable to CPAP, then please send order for auto CPAP range 5 to 15 cm H2O with heated humidity and mask of choice.  Have download sent 1 month after starting CPAP and set up ROV 2 months after starting CPAP.  ROV can be with me or NP.

## 2016-07-01 NOTE — Telephone Encounter (Signed)
Spoke with pt, aware of recs.  Agreeable to starting cpap.  Order placed.  Pt will call back after receiving cpap to schedule rov in appropriate time frame.  Nothing further needed at this time.

## 2016-07-04 DIAGNOSIS — G4733 Obstructive sleep apnea (adult) (pediatric): Secondary | ICD-10-CM | POA: Diagnosis not present

## 2016-07-06 ENCOUNTER — Ambulatory Visit (INDEPENDENT_AMBULATORY_CARE_PROVIDER_SITE_OTHER): Payer: BLUE CROSS/BLUE SHIELD | Admitting: Cardiovascular Disease

## 2016-07-06 ENCOUNTER — Encounter: Payer: Self-pay | Admitting: Cardiovascular Disease

## 2016-07-06 ENCOUNTER — Other Ambulatory Visit: Payer: Self-pay | Admitting: *Deleted

## 2016-07-06 VITALS — BP 129/80 | HR 72 | Ht 65.0 in | Wt 344.4 lb

## 2016-07-06 DIAGNOSIS — Z8249 Family history of ischemic heart disease and other diseases of the circulatory system: Secondary | ICD-10-CM

## 2016-07-06 DIAGNOSIS — G4730 Sleep apnea, unspecified: Secondary | ICD-10-CM | POA: Diagnosis not present

## 2016-07-06 DIAGNOSIS — I471 Supraventricular tachycardia: Secondary | ICD-10-CM | POA: Diagnosis not present

## 2016-07-06 DIAGNOSIS — M25474 Effusion, right foot: Secondary | ICD-10-CM

## 2016-07-06 DIAGNOSIS — M25476 Effusion, unspecified foot: Secondary | ICD-10-CM

## 2016-07-06 DIAGNOSIS — M25475 Effusion, left foot: Secondary | ICD-10-CM

## 2016-07-06 DIAGNOSIS — R072 Precordial pain: Secondary | ICD-10-CM | POA: Diagnosis not present

## 2016-07-06 DIAGNOSIS — M25473 Effusion, unspecified ankle: Secondary | ICD-10-CM | POA: Diagnosis not present

## 2016-07-06 DIAGNOSIS — G4733 Obstructive sleep apnea (adult) (pediatric): Secondary | ICD-10-CM

## 2016-07-06 NOTE — Patient Instructions (Signed)
Your physician wants you to follow-up in: [place] will receive a reminder letter in the mail two months in advance. If you don't receive a letter, please call our office to schedule the follow-up appointment.  Your physician recommends that you continue on your current medications as directed. Please refer to the Current Medication list given to you today.  Thank you for choosing [facility]!!

## 2016-07-06 NOTE — Progress Notes (Signed)
SUBJECTIVE: The patient returns for follow-up after undergoing cardiovascular testing performed for the evaluation of chest pain.  She is my first time meeting her.  She also has a history of paroxysmal supraventricular tachycardia, sleep apnea, tobacco abuse, and morbid obesity. Family history is significant for premature coronary artery disease.  Nuclear stress test 04/28/16 was normal, LVEF greater than 65%.  I reviewed her echocardiogram report dated 11/07/13, which demonstrated normal left ventricular systolic function and regional wall motion, LVEF 55-65%. There was mild LVH.  It appears she was hospitalized for chest pain and found to be in SVT in November 2017. She converted to normal sinus rhythm with 6 mg of intravenous adenosine.  She tells me she has gained more weight. She is 344 pounds. She complains of feet and leg swelling. She has had no recurrence of SVT. She has had intermittent chest pains radiating down the left arm. She has had some dizziness with changes in position and has almost passed out. She did not check her blood pressures during these times.  Heart rate elevates up to 100-110 bpm with exertion while vacuuming. She takes rest breaks when this occurs.  She was recently diagnosed with severe sleep apnea and is being fitted for CPAP.  She has questions about going on weight loss medications.   Review of Systems: As per "subjective", otherwise negative.  No Known Allergies  Current Outpatient Prescriptions  Medication Sig Dispense Refill  . acyclovir ointment (ZOVIRAX) 5 % Apply 1 application topically every 3 (three) hours. 30 g 5  . albuterol (PROAIR HFA) 108 (90 BASE) MCG/ACT inhaler Inhale 2 puffs into the lungs every 4 (four) hours as needed. 18 g 2  . ALPRAZolam (XANAX) 0.5 MG tablet Take 1 tablet (0.5 mg total) by mouth every 8 (eight) hours as needed for sleep or anxiety. 30 tablet 1  . Cholecalciferol (VITAMIN D) 2000 units tablet Take 2,000  Units by mouth daily.    . cyanocobalamin (,VITAMIN B-12,) 1000 MCG/ML injection Inject 1 mL (1,000 mcg total) into the muscle every 30 (thirty) days. 1 mL 0  . metoprolol tartrate (LOPRESSOR) 25 MG tablet Take 25 mg by mouth 2 (two) times daily.    . Naltrexone-Bupropion HCl ER 8-90 MG TB12 1 tab po qam x 1 week, then 1 tab po bid x 1 week, then 2 tabs po qam and 1 tab po pm x 1 week, then 2 tabs po bid 70 tablet 0  . pantoprazole (PROTONIX) 40 MG tablet Take 1 tablet (40 mg total) by mouth daily. 30 tablet 1  . PARoxetine (PAXIL) 20 MG tablet Take 1 tablet (20 mg total) by mouth daily. 90 tablet 1  . valACYclovir (VALTREX) 1000 MG tablet Take two tablets ( total 2000 mg) by mouth q12h x 1 day; Start: ASAP after symptom onset 6 tablet 2   No current facility-administered medications for this visit.     Past Medical History:  Diagnosis Date  . Anxiety   . ASTHMA   . DELAYED GASTRIC EMPTYING   . DEPRESSION   . GERD   . OBESITY   . OBSTRUCTIVE SLEEP APNEA   . POLYCYSTIC OVARIAN DISEASE   . SMOKER   . SVT (supraventricular tachycardia) ([place])   . Tachycardia   . Umbilical hernia   . Umbilical hernia   . Vitamin B12 deficiency 03/2015 dx   start IM replacement q 30d  . Vitamin D deficiency 03/2015 dx    Past Surgical History:  Procedure Laterality Date  . CESAREAN SECTION  97 & 98   x's 2  . Cocxyl removal  1995  . DILATION AND CURETTAGE OF UTERUS    . LAPAROSCOPY    . TUBAL LIGATION  1998    Social History   Social History  . Marital status: Married    Spouse name: N/A  . Number of children: 2  . 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 Smoking status: Current Some Day Smoker    Packs/day: 0.25    Types: Cigarettes    Start date: 10/23/1987  . Smokeless tobacco: Never Used  . Alcohol use 0.0 oz/week     Comment: Rarely  . Drug use: No  . Sexual activity: Not on file   Other Topics  Concern  . Not on file   Social History Narrative   Lives with husband & 2 kids from 1st marriage. (1st husband expired 1998-leukemia) work as Merchandiser, retail, but currently unemployed     Vitals:   07/06/16 1131  BP: 129/80  Pulse: 72  SpO2: 95%  Weight: (!) 344 lb 6.4 oz (156.2 kg)  Height: 5' 5"  (1.651 m)    PHYSICAL EXAM General: NAD HEENT: Normal. Neck: No JVD, no thyromegaly. Lungs: Clear to auscultation bilaterally with normal respiratory effort. CV: Nondisplaced PMI.  Regular rate and rhythm, normal S1/S2, no S3/S4, no murmur. No pretibial or periankle edema.     Abdomen: Morbidly obese.  Neurologic: Alert and oriented.  Psych: Normal affect. Skin: Normal. Musculoskeletal: No gross deformities.    ECG: Most recent ECG reviewed.      ASSESSMENT AND PLAN: 1. Paroxysmal supraventricular tachycardia: No recurrences. Continue metoprolol 25 mg twice daily.  2. Chest pain: Normal nuclear stress test as detailed above. No indication for further cardiovascular testing.  3. Morbid obesity: I encouraged weight loss with exercise and dietary modification. She is also going to try weight loss medications. I did tell her that this may increase the risk of SVT but is worth trying.  4. Severe sleep apnea: She is being fitted for CPAP.  5. Leg and feet swelling: This is due to morbid obesity. I again encouraged weight loss with walking and dietary modification.  Dispo: fu 1 year.  Time spent: 40 minutes, of which greater than 50% was spent reviewing symptoms, relevant blood tests and studies, and discussing management plan with the patient.    Kate Sable, M.D., F.A.C.C.

## 2016-07-13 ENCOUNTER — Other Ambulatory Visit: Payer: Self-pay | Admitting: Gastroenterology

## 2016-07-13 DIAGNOSIS — S39012A Strain of muscle, fascia and tendon of lower back, initial encounter: Secondary | ICD-10-CM

## 2016-07-14 ENCOUNTER — Other Ambulatory Visit: Payer: Self-pay | Admitting: Internal Medicine

## 2016-07-14 DIAGNOSIS — E538 Deficiency of other specified B group vitamins: Secondary | ICD-10-CM

## 2016-07-14 MED ORDER — CYANOCOBALAMIN 1000 MCG/ML IJ SOLN: 1000.0000 ug | INTRAMUSCULAR | Status: AC

## 2016-07-21 ENCOUNTER — Telehealth: Payer: Self-pay | Admitting: Pulmonary Disease

## 2016-07-21 NOTE — Telephone Encounter (Signed)
Called and lmom to make the pt aware that we did receive the message and will forward this to Ashtyn to make her aware.

## 2016-07-28 ENCOUNTER — Ambulatory Visit (INDEPENDENT_AMBULATORY_CARE_PROVIDER_SITE_OTHER): Payer: BLUE CROSS/BLUE SHIELD | Admitting: General Practice

## 2016-07-28 ENCOUNTER — Encounter: Payer: Self-pay | Admitting: Internal Medicine

## 2016-07-28 ENCOUNTER — Ambulatory Visit: Payer: BLUE CROSS/BLUE SHIELD

## 2016-07-28 DIAGNOSIS — E538 Deficiency of other specified B group vitamins: Secondary | ICD-10-CM | POA: Diagnosis not present

## 2016-07-28 MED ORDER — CYANOCOBALAMIN 1000 MCG/ML IJ SOLN
1000.0000 ug | Freq: Once | INTRAMUSCULAR | Status: AC
  Administered 2016-07-28: 1000 ug via INTRAMUSCULAR

## 2016-07-29 ENCOUNTER — Other Ambulatory Visit: Payer: Self-pay | Admitting: Emergency Medicine

## 2016-07-29 DIAGNOSIS — B001 Herpesviral vesicular dermatitis: Secondary | ICD-10-CM

## 2016-07-29 MED ORDER — VALACYCLOVIR HCL 1 G PO TABS: ORAL_TABLET | ORAL | 2 refills | Status: DC

## 2016-07-29 NOTE — Progress Notes (Signed)
Injection given.   Binnie Rail, MD

## 2016-08-24 ENCOUNTER — Ambulatory Visit (INDEPENDENT_AMBULATORY_CARE_PROVIDER_SITE_OTHER): Payer: BLUE CROSS/BLUE SHIELD

## 2016-08-24 ENCOUNTER — Telehealth: Payer: Self-pay

## 2016-08-24 DIAGNOSIS — E538 Deficiency of other specified B group vitamins: Secondary | ICD-10-CM | POA: Diagnosis not present

## 2016-08-24 MED ORDER — CYANOCOBALAMIN 1000 MCG/ML IJ SOLN
1000.0000 ug | Freq: Once | INTRAMUSCULAR | Status: AC
  Administered 2016-08-24: 1000 ug via INTRAMUSCULAR

## 2016-08-24 NOTE — Telephone Encounter (Signed)
She has not absorbed oral B12 in the past - ok to continue monthly B12 injections

## 2016-08-24 NOTE — Telephone Encounter (Signed)
Routing to dr burns, patient's b12 lab value was 556 in feb/2018----do you want her to continue getting b12 injections---please advise, patient is scheduled on nurse visit today---thanks

## 2016-08-24 NOTE — Progress Notes (Signed)
Injection given.   Binnie Rail, MD

## 2016-08-31 ENCOUNTER — Encounter: Payer: Self-pay | Admitting: Internal Medicine

## 2016-08-31 ENCOUNTER — Ambulatory Visit (INDEPENDENT_AMBULATORY_CARE_PROVIDER_SITE_OTHER): Payer: BLUE CROSS/BLUE SHIELD | Admitting: Internal Medicine

## 2016-08-31 DIAGNOSIS — E538 Deficiency of other specified B group vitamins: Secondary | ICD-10-CM

## 2016-08-31 DIAGNOSIS — F32A Depression, unspecified: Secondary | ICD-10-CM

## 2016-08-31 DIAGNOSIS — F419 Anxiety disorder, unspecified: Secondary | ICD-10-CM | POA: Diagnosis not present

## 2016-08-31 DIAGNOSIS — F329 Major depressive disorder, single episode, unspecified: Secondary | ICD-10-CM | POA: Diagnosis not present

## 2016-08-31 NOTE — Assessment & Plan Note (Addendum)
Continue B12 injections monthly indefinitely  Does not absorb oral B12

## 2016-08-31 NOTE — Assessment & Plan Note (Signed)
Xanax only as needed

## 2016-08-31 NOTE — Progress Notes (Signed)
Subjective:    Patient ID: [name], female    DOB: 1970/09/06, 46 y.o.   MRN: [id number]  HPI The patient is here for follow up.  B-12 deficiency:  She has been on oral B12 in the past and did not absorb it. We will continue with intramuscular replacement every 30 days.   Morbid obesity:  She has not been taking contrave - she did purchase it but has not started it.   She wanted to discuss the medication first.  She is exercising regularly - 3 times a week.  She has counted calories, but is not consistent with it.  She occasionally will go to fast food places about once a month and eat bad.  When she has counted calories in the past she is not necessarily wait or measured everything. She wonders if her portions are too large.. .    Anxiety, depression:She has occasional anxiety, but denies any depression. She is taking the Paxil daily and was started on this medication by GI for stomach symptoms. She does not think that she needs it.  She has been on Wellbutrin in the past and it did help her quit smoking and controlled some anxiety/depression.  Medications and allergies reviewed with patient and updated if appropriate.  Patient Active Problem List   Diagnosis Date Noted  . Family history of early CAD 03/23/2016  . SVT (supraventricular tachycardia) ([place]) 03/14/2016  . Adrenal nodule ([place]) 01/06/2016  . Nausea without vomiting 12/02/2015  . 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 Umbilical hernia 67/03/4579  . Glucose intolerance (impaired glucose tolerance) 04/09/2015  . Morbid obesity ([place]) 04/09/2015  . Vitamin D deficiency   . Vitamin B12 deficiency   . Anxiety   . Tobacco abuse 01/28/2009  . Depression 01/28/2009  . GERD 01/28/2009  . POLYCYSTIC OVARIAN DISEASE 02/10/2007  . Obstructive sleep apnea 02/10/2007  . Asthma 02/10/2007    Current Outpatient Prescriptions on File Prior to Visit  Medication Sig Dispense Refill  . acyclovir ointment (ZOVIRAX) 5 % Apply  1 application topically every 3 (three) hours. 30 g 5  . albuterol (PROAIR HFA) 108 (90 BASE) MCG/ACT inhaler Inhale 2 puffs into the lungs every 4 (four) hours as needed. 18 g 2  . ALPRAZolam (XANAX) 0.5 MG tablet Take 1 tablet (0.5 mg total) by mouth every 8 (eight) hours as needed for sleep or anxiety. 30 tablet 1  . Cholecalciferol (VITAMIN D) 2000 units tablet Take 2,000 Units by mouth daily.    . metoprolol tartrate (LOPRESSOR) 25 MG tablet Take 25 mg by mouth 2 (two) times daily.    . Naltrexone-Bupropion HCl ER 8-90 MG TB12 1 tab po qam x 1 week, then 1 tab po bid x 1 week, then 2 tabs po qam and 1 tab po pm x 1 week, then 2 tabs po bid 70 tablet 0  . pantoprazole (PROTONIX) 40 MG tablet TAKE 1 TABLET (40 MG TOTAL) BY MOUTH DAILY. 30 tablet 1  . PARoxetine (PAXIL) 20 MG tablet Take 1 tablet (20 mg total) by mouth daily. 90 tablet 1  . valACYclovir (VALTREX) 1000 MG tablet Take two tablets ( total 2000 mg) by mouth q12h x 1 day; Start: ASAP after symptom onset 6 tablet 2   Current Facility-Administered Medications on File Prior to Visit  Medication Dose Route Frequency Provider Last Rate Last Dose  . cyanocobalamin ((VITAMIN B-12)) injection 1,000 mcg  1,000 mcg Intramuscular Q30 days Binnie Rail, MD  Past Medical History:  Diagnosis Date  . Anxiety   . ASTHMA   . DELAYED GASTRIC EMPTYING   . DEPRESSION   . GERD   . OBESITY   . OBSTRUCTIVE SLEEP APNEA   . POLYCYSTIC OVARIAN DISEASE   . SMOKER   . SVT (supraventricular tachycardia) ([place])   . Tachycardia   . Umbilical hernia   . Umbilical hernia   . Vitamin B12 deficiency 03/2015 dx   start IM replacement q 30d  . Vitamin D deficiency 03/2015 dx    Past Surgical History:  Procedure Laterality Date  . CESAREAN SECTION  97 & 98   x's 2  . Cocxyl removal  1995  . DILATION AND CURETTAGE OF UTERUS    . LAPAROSCOPY    . TUBAL LIGATION  1998    Social History   Social History  . Marital status: Married     Spouse name: N/A  . Number of children: 2  . 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 Smoking status: Current Some Day Smoker    Packs/day: 0.25    Types: Cigarettes    Start date: 10/23/1987  . Smokeless tobacco: Never Used  . Alcohol use 0.0 oz/week     Comment: Rarely  . Drug use: No  . Sexual activity: Not on file   Other Topics Concern  . Not on file   Social History Narrative   Lives with husband & 2 kids from 1st marriage. (1st husband expired 1998-leukemia) work as Merchandiser, retail, but currently unemployed    Family History  Problem Relation Age of Onset  . Breast cancer Mother        dx 11/2009  . Diabetes Maternal Aunt   . Prostate cancer Maternal Grandfather 75       also hx colon polyps  . Heart disease Paternal Grandmother   . Pancreatic cancer Paternal Grandmother 17  . Heart murmur Sister   . Adrenal disorder Neg Hx     Review of Systems  Psychiatric/Behavioral: Negative for dysphoric mood. The patient is nervous/anxious (Occasional).        Objective:   Vitals:   08/31/16 1550  BP: 128/82  Pulse: 83  Resp: 16  Temp: 98.1 F (36.7 C)   Wt Readings from Last 3 Encounters:  08/31/16 (!) 343 lb (155.6 kg)  07/06/16 (!) 344 lb 6.4 oz (156.2 kg)  06/02/16 (!) 337 lb 9.6 oz (153.1 kg)   Body mass index is 57.08 kg/m.   Physical Exam  Constitutional: She appears well-developed and well-nourished. No distress.  Skin: She is not diaphoretic.  Psychiatric: She has a normal mood and affect. Her behavior is normal. Judgment and thought content normal.           Assessment & Plan:   20 minutes were spent face-to-face with the patient, over 50% of which was spent counseling regarding  Obesity, specifically what foods to eat and not eat-discussed specific examples. We discussed medication options-we will go ahead with the medication she has We discussed the weight  loss clinic We discussed the importance of exercise and increasing exercise to at least 5 days a week. Discussed some of the psychological issues and eating and overeating.     See Problem List for Assessment and Plan of chronic medical problems.

## 2016-08-31 NOTE — Assessment & Plan Note (Addendum)
Will start contrave tomorrow and take as prescribed Discussed dietary changes - work on eating healthy foods - do not concentrate on portions right now - lots of veges, fruit and lean protein -  We will concentrate more and portions once she is eating more healthy foods Avoid fast food, fried food - ok to cheat once a week - find a healthier treat F/u in one month  discussed weight loss clinic-this may be a good option for her in the near future

## 2016-08-31 NOTE — Assessment & Plan Note (Signed)
Denies depression Taper off Paxil over the next week We'll be starting contrave, which has Wellbutrin in it, which should help with any depression she has

## 2016-08-31 NOTE — Patient Instructions (Addendum)
Taper off paxil over the next week - start taking it every other day for one week and then stop  Start the [place] tomorrow and take as prescribed.

## 2016-09-18 ENCOUNTER — Emergency Department (HOSPITAL_COMMUNITY)
Admission: EM | Admit: 2016-09-18 | Discharge: 2016-09-18 | Disposition: A | Discharge status: Home / Self Care | Payer: BLUE CROSS/BLUE SHIELD | Attending: Emergency Medicine | Admitting: Emergency Medicine

## 2016-09-18 ENCOUNTER — Encounter (HOSPITAL_COMMUNITY): Payer: Self-pay | Admitting: Emergency Medicine

## 2016-09-18 ENCOUNTER — Emergency Department (HOSPITAL_COMMUNITY): Payer: BLUE CROSS/BLUE SHIELD

## 2016-09-18 DIAGNOSIS — J45909 Unspecified asthma, uncomplicated: Secondary | ICD-10-CM | POA: Insufficient documentation

## 2016-09-18 DIAGNOSIS — Z79899 Other long term (current) drug therapy: Secondary | ICD-10-CM | POA: Diagnosis not present

## 2016-09-18 DIAGNOSIS — R110 Nausea: Secondary | ICD-10-CM | POA: Insufficient documentation

## 2016-09-18 DIAGNOSIS — R079 Chest pain, unspecified: Secondary | ICD-10-CM

## 2016-09-18 DIAGNOSIS — F17210 Nicotine dependence, cigarettes, uncomplicated: Secondary | ICD-10-CM | POA: Diagnosis not present

## 2016-09-18 DIAGNOSIS — R0789 Other chest pain: Secondary | ICD-10-CM | POA: Insufficient documentation

## 2016-09-18 DIAGNOSIS — D649 Anemia, unspecified: Secondary | ICD-10-CM | POA: Insufficient documentation

## 2016-09-18 DIAGNOSIS — R0600 Dyspnea, unspecified: Secondary | ICD-10-CM | POA: Diagnosis present

## 2016-09-18 LAB — CBC
HCT: 35.6 % — ABNORMAL LOW (ref 36.0–46.0)
Hemoglobin: 11.5 g/dL — ABNORMAL LOW (ref 12.0–15.0)
MCH: 29.9 pg (ref 26.0–34.0)
MCHC: 32.3 g/dL (ref 30.0–36.0)
MCV: 92.5 fL (ref 78.0–100.0)
Platelets: 333 10*3/uL (ref 150–400)
RBC: 3.85 MIL/uL — ABNORMAL LOW (ref 3.87–5.11)
RDW: 13.9 % (ref 11.5–15.5)
WBC: 8.8 10*3/uL (ref 4.0–10.5)

## 2016-09-18 LAB — BASIC METABOLIC PANEL
Anion gap: 5 (ref 5–15)
BUN: 17 mg/dL (ref 6–20)
CO2: 27 mmol/L (ref 22–32)
Calcium: 8.6 mg/dL — ABNORMAL LOW (ref 8.9–10.3)
Chloride: 105 mmol/L (ref 101–111)
Creatinine, Ser: 0.86 mg/dL (ref 0.44–1.00)
GFR calc Af Amer: >60 mL/min (ref >60)
GFR calc non Af Amer: >60 mL/min (ref >60)
Glucose, Bld: 107 mg/dL — ABNORMAL HIGH (ref 65–99)
Potassium: 4.3 mmol/L (ref 3.5–5.1)
Sodium: 137 mmol/L (ref 135–145)

## 2016-09-18 LAB — DIFFERENTIAL
Basophils Absolute: 0 10*3/uL (ref 0.0–0.1)
Basophils Relative: 0 %
Eosinophils Absolute: 0.1 10*3/uL (ref 0.0–0.7)
Eosinophils Relative: 2 %
Lymphocytes Relative: 31 %
Lymphs Abs: 2.7 10*3/uL (ref 0.7–4.0)
Monocytes Absolute: 0.5 10*3/uL (ref 0.1–1.0)
Monocytes Relative: 6 %
Neutro Abs: 5.3 10*3/uL (ref 1.7–7.7)
Neutrophils Relative %: 61 %

## 2016-09-18 LAB — I-STAT TROPONIN, ED
Troponin i, poc: 0 ng/mL (ref 0.00–0.08)
Troponin i, poc: 0.01 ng/mL (ref 0.00–0.08)

## 2016-09-18 MED ORDER — ONDANSETRON HCL 4 MG PO TABS: 4.0000 mg | ORAL_TABLET | Freq: Four times a day (QID) | ORAL | 0 refills | Status: DC | PRN

## 2016-09-18 MED ORDER — SODIUM CHLORIDE 0.9 % IV BOLUS (SEPSIS)
1000.0000 mL | Freq: Once | INTRAVENOUS | Status: AC
  Administered 2016-09-18: 1000 mL via INTRAVENOUS

## 2016-09-18 MED ORDER — LORAZEPAM 2 MG/ML IJ SOLN
1.0000 mg | Freq: Once | INTRAMUSCULAR | Status: AC
  Administered 2016-09-18: 1 mg via INTRAVENOUS
  Filled 2016-09-18: qty 1

## 2016-09-18 MED ORDER — DIPHENHYDRAMINE HCL 50 MG/ML IJ SOLN
25.0000 mg | Freq: Once | INTRAMUSCULAR | Status: AC
  Administered 2016-09-18: 25 mg via INTRAVENOUS
  Filled 2016-09-18: qty 1

## 2016-09-18 MED ORDER — NITROGLYCERIN 0.4 MG SL SUBL
0.4000 mg | SUBLINGUAL_TABLET | SUBLINGUAL | Status: DC | PRN
  Administered 2016-09-18: 0.4 mg via SUBLINGUAL
  Filled 2016-09-18: qty 1

## 2016-09-18 MED ORDER — PROMETHAZINE HCL 25 MG/ML IJ SOLN
12.5000 mg | Freq: Once | INTRAMUSCULAR | Status: AC
  Administered 2016-09-18: 12.5 mg via INTRAVENOUS
  Filled 2016-09-18: qty 1

## 2016-09-18 MED ORDER — ASPIRIN 81 MG PO CHEW
324.0000 mg | CHEWABLE_TABLET | Freq: Once | ORAL | Status: AC
  Administered 2016-09-18: 324 mg via ORAL
  Filled 2016-09-18: qty 4

## 2016-09-18 MED ORDER — NITROGLYCERIN 0.4 MG SL SUBL: 0.4000 mg | SUBLINGUAL_TABLET | SUBLINGUAL | 0 refills | Status: DC | PRN

## 2016-09-18 MED ORDER — METOCLOPRAMIDE HCL 5 MG/ML IJ SOLN
10.0000 mg | Freq: Once | INTRAMUSCULAR | Status: AC
  Administered 2016-09-18: 10 mg via INTRAVENOUS
  Filled 2016-09-18: qty 2

## 2016-09-18 MED ORDER — ONDANSETRON HCL 4 MG/2ML IJ SOLN
INTRAMUSCULAR | Status: AC
  Filled 2016-09-18: qty 2

## 2016-09-18 MED ORDER — ONDANSETRON HCL 4 MG/2ML IJ SOLN
4.0000 mg | Freq: Once | INTRAMUSCULAR | Status: AC
  Administered 2016-09-18: 4 mg via INTRAVENOUS

## 2016-09-18 NOTE — ED Notes (Signed)
Pt c/o nausea and feeling light headed.  Rechecking vitals.  Dr. Dayna Barker notified .  Orders received.

## 2016-09-18 NOTE — ED Triage Notes (Signed)
Pt C/O of chest pain that woke her from her sleep. Pt also C/O of SOB and nausea.

## 2016-09-18 NOTE — ED Provider Notes (Signed)
[facility] DEPT Provider Note   CSN: [id number] Arrival date & time: 09/18/16  0357     History   Chief Complaint Chief Complaint  Patient presents with  . Chest Pain    HPI [name] is a 46 y.o. female.  The history is provided by the patient.  She woke up at about 2:30 AM with a vague pressure feeling in her chest. She rated at 8/10. There is associated dyspnea, nausea but no diaphoresis. She felt somewhat better when she walked around, nothing made it worse. She has not taken anything for the discomfort. There is no radiation of discomfort. She is a smoker. There is no history of diabetes, hypertension, hyperlipidemia. There is a family history of premature coronary atherosclerosis with mother having had first heart attack at age 44. There is no history of DVT or PE, no recent history of surgery or travel, no history of exogenous estrogen use.  Past Medical History:  Diagnosis Date  . Anxiety   . ASTHMA   . DELAYED GASTRIC EMPTYING   . DEPRESSION   . GERD   . OBESITY   . OBSTRUCTIVE SLEEP APNEA   . POLYCYSTIC OVARIAN DISEASE   . SMOKER   . SVT (supraventricular tachycardia) ([place])   . Tachycardia   . Umbilical hernia   . Umbilical hernia   . Vitamin B12 deficiency 03/2015 dx   start IM replacement q 30d  . Vitamin D deficiency 03/2015 dx    Patient Active Problem List   Diagnosis Date Noted  . Family history of early CAD 03/23/2016  . SVT (supraventricular tachycardia) ([place]) 03/14/2016  . Adrenal nodule ([place]) 01/06/2016  . Nausea without vomiting 12/02/2015  . 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 Umbilical hernia 10/05/9483  . Glucose intolerance (impaired glucose tolerance) 04/09/2015  . Morbid obesity ([place]) 04/09/2015  . Vitamin D deficiency   . Vitamin B12 deficiency   . Anxiety   . Tobacco abuse 01/28/2009  . Depression 01/28/2009  . GERD 01/28/2009  . POLYCYSTIC OVARIAN DISEASE 02/10/2007  . Obstructive sleep apnea 02/10/2007  . Asthma  02/10/2007    Past Surgical History:  Procedure Laterality Date  . CESAREAN SECTION  97 & 98   x's 2  . Cocxyl removal  1995  . DILATION AND CURETTAGE OF UTERUS    . LAPAROSCOPY    . TUBAL LIGATION  1998    OB History    Gravida Para Term Preterm AB Living   3 2 2   1      SAB TAB Ectopic Multiple Live Births     1             Home Medications    Prior to Admission medications   Medication Sig Start Date End Date Taking? Authorizing Provider  metoprolol tartrate (LOPRESSOR) 25 MG tablet Take 25 mg by mouth 2 (two) times daily.   Yes [provider]  Naltrexone-Bupropion HCl ER 8-90 MG TB12 1 tab po qam x 1 week, then 1 tab po bid x 1 week, then 2 tabs po qam and 1 tab po pm x 1 week, then 2 tabs po bid 06/23/16  Yes Burns, Claudina Lick, MD  acyclovir ointment (ZOVIRAX) 5 % Apply 1 application topically every 3 (three) hours. 07/16/15   Binnie Rail, MD  albuterol (PROAIR HFA) 108 (90 BASE) MCG/ACT inhaler Inhale 2 puffs into the lungs every 4 (four) hours as needed. 01/02/14   Rowe Clack, MD  ALPRAZolam (  XANAX) 0.5 MG tablet Take 1 tablet (0.5 mg total) by mouth every 8 (eight) hours as needed for sleep or anxiety. 04/09/15   Rowe Clack, MD  Cholecalciferol (VITAMIN D) 2000 units tablet Take 2,000 Units by mouth daily.    [provider]  pantoprazole (PROTONIX) 40 MG tablet TAKE 1 TABLET (40 MG TOTAL) BY MOUTH DAILY. 07/13/16   Mauri Pole, MD  valACYclovir (VALTREX) 1000 MG tablet Take two tablets ( total 2000 mg) by mouth q12h x 1 day; Start: ASAP after symptom onset 07/29/16   Binnie Rail, MD    Family History Family History  Problem Relation Age of Onset  . Breast cancer Mother        dx 11/2009  . Diabetes Maternal Aunt   . Prostate cancer Maternal Grandfather 10       also hx colon polyps  . Heart disease Paternal Grandmother   . Pancreatic cancer Paternal Grandmother 54  . Heart murmur Sister   . Adrenal disorder Neg Hx      Social History Social History  Substance Use Topics  . Smoking status: Current Some Day Smoker    Packs/day: 0.25    Types: Cigarettes    Start date: 10/23/1987  . Smokeless tobacco: Never Used  . Alcohol use 0.0 oz/week     Comment: Rarely     Allergies   Patient has no known allergies.   Review of Systems Review of Systems  All other systems reviewed and are negative.    Physical Exam Updated Vital Signs BP 129/87   Pulse 70   Temp 97.5 F (36.4 C) (Oral)   Resp (!) 24   Ht 5' 5"  (1.651 m)   Wt (!) 152 kg (335 lb)   LMP 09/11/2016 (Approximate) Comment: pt shielded  SpO2 99%   BMI 55.75 kg/m   Physical Exam  Nursing note and vitals reviewed.  Morbidly obese 46 year old female, resting comfortably and in no acute distress. Vital signs are significant for tachypnea. Oxygen saturation is 99%, which is normal. Head is normocephalic and atraumatic. PERRLA, EOMI. Oropharynx is clear. Neck is nontender and supple without adenopathy or JVD. Back is nontender and there is no CVA tenderness. Lungs are clear without rales, wheezes, or rhonchi. Chest is nontender. Heart has regular rate and rhythm without murmur. Abdomen is soft, flat, nontender without masses or hepatosplenomegaly and peristalsis is normoactive. Extremities have trace edema, full range of motion is present. Skin is warm and dry without rash. Neurologic: Mental status is normal, cranial nerves are intact, there are no motor or sensory deficits.  ED Treatments / Results  Labs (all labs ordered are listed, but only abnormal results are displayed) Labs Reviewed  BASIC METABOLIC PANEL - Abnormal; Notable for the following:       Result Value   Glucose, Bld 107 (*)    Calcium 8.6 (*)    All other components within normal limits  CBC - Abnormal; Notable for the following:    RBC 3.85 (*)    Hemoglobin 11.5 (*)    HCT 35.6 (*)    All other components within normal limits  DIFFERENTIAL  I-STAT  TROPOININ, ED    EKG  EKG Interpretation  Date/Time:  Sunday September 18 2016 04:12:06 EDT Ventricular Rate:  67 PR Interval:    QRS Duration: 98 QT Interval:  415 QTC Calculation: 439 R Axis:   65 Text Interpretation:  Sinus rhythm Low voltage, precordial leads Baseline wander  in lead(s) V1 When compared with ECG of 03/08/2016, No significant change was found Confirmed by Delora Fuel ([id number]) on 09/18/2016 4:44:23 AM       Radiology Dg Chest 2 View  Result Date: 09/18/2016 CLINICAL DATA:  Awoke from sleep with chest pain. EXAM: CHEST  2 VIEW COMPARISON:  Radiographs 03/08/2016 FINDINGS: The cardiomediastinal contours are normal. Heart at the upper limits of normal in size. Lingular scarring. No consolidation, pleural effusion, or pneumothorax. No acute osseous abnormalities are seen. IMPRESSION: No acute abnormality. Electronically Signed   By: Jeb Levering M.D.   On: 09/18/2016 05:24    Procedures Procedures (including critical care time)  Medications Ordered in ED Medications  nitroGLYCERIN (NITROSTAT) SL tablet 0.4 mg (0.4 mg Sublingual Given 09/18/16 0615)  metoCLOPramide (REGLAN) injection 10 mg (not administered)  diphenhydrAMINE (BENADRYL) injection 25 mg (not administered)  aspirin chewable tablet 324 mg (324 mg Oral Given 09/18/16 0518)  LORazepam (ATIVAN) injection 1 mg (1 mg Intravenous Given 09/18/16 0518)  ondansetron (ZOFRAN) injection 4 mg (4 mg Intravenous Given 09/18/16 0604)     Initial Impression / Assessment and Plan / ED Course  I have reviewed the triage vital signs and the nursing notes.  Pertinent labs & imaging results that were available during my care of the patient were reviewed by me and considered in my medical decision making (see chart for details).  Chest pain worrisome for possible cardiac origin. Heart score = 3 which puts her at a low risk for major adverse cardiac events in 30 days. She is PERC negative. Old records are reviewed, and she has  a history of SVT. Nuclear stress test in January was normal. ECG shows no ST or T changes. She will be given aspirin and nitroglycerin.  She had complete relief of chest discomfort with one nitroglycerin. Initial troponin is undetectable. She will be kept in the ED for a delta troponin. Case is signed out to Dr. Dolly Rias.  Final Clinical Impressions(s) / ED Diagnoses   Final diagnoses:  Chest discomfort  Nausea  Normochromic normocytic anemia    New Prescriptions New Prescriptions   No medications on file     Delora Fuel, MD 36/64/40 [phone]

## 2016-09-18 NOTE — Discharge Instructions (Signed)
Return to the ED if you have an episode of chest discomfort which does not go away with three nitroglycerin tablets.

## 2016-09-18 NOTE — ED Notes (Signed)
Patient transported to X-ray 

## 2016-09-19 ENCOUNTER — Other Ambulatory Visit: Payer: Self-pay | Admitting: Gastroenterology

## 2016-09-19 ENCOUNTER — Telehealth: Payer: Self-pay | Admitting: *Deleted

## 2016-09-19 DIAGNOSIS — S39012A Strain of muscle, fascia and tendon of lower back, initial encounter: Secondary | ICD-10-CM

## 2016-09-19 MED ORDER — ALPRAZOLAM 0.5 MG PO TABS: 0.5000 mg | ORAL_TABLET | Freq: Three times a day (TID) | ORAL | 0 refills | Status: DC | PRN

## 2016-09-19 NOTE — Telephone Encounter (Signed)
Notified pr refill has been called into [facility] had to leave on pharmacy vm./lmb

## 2016-09-19 NOTE — Telephone Encounter (Signed)
Rec'd call pt states her alprazolam script has expired requesting new rx for alprazolam to be sent to [facility].../lmb

## 2016-09-19 NOTE — Telephone Encounter (Signed)
[place] controlled substance database checked.  Ok to fill medication.

## 2016-09-23 ENCOUNTER — Emergency Department (HOSPITAL_COMMUNITY): Payer: BLUE CROSS/BLUE SHIELD

## 2016-09-23 ENCOUNTER — Emergency Department (HOSPITAL_COMMUNITY)
Admission: EM | Admit: 2016-09-23 | Discharge: 2016-09-23 | Disposition: A | Discharge status: Home / Self Care | Payer: BLUE CROSS/BLUE SHIELD | Attending: Emergency Medicine | Admitting: Emergency Medicine

## 2016-09-23 ENCOUNTER — Ambulatory Visit (INDEPENDENT_AMBULATORY_CARE_PROVIDER_SITE_OTHER): Payer: BLUE CROSS/BLUE SHIELD | Admitting: Internal Medicine

## 2016-09-23 ENCOUNTER — Encounter (HOSPITAL_COMMUNITY): Payer: Self-pay

## 2016-09-23 ENCOUNTER — Telehealth: Payer: Self-pay | Admitting: Internal Medicine

## 2016-09-23 VITALS — BP 128/82 | HR 76 | Temp 97.8°F | Resp 16 | Wt 337.0 lb

## 2016-09-23 DIAGNOSIS — Z79899 Other long term (current) drug therapy: Secondary | ICD-10-CM | POA: Diagnosis not present

## 2016-09-23 DIAGNOSIS — E538 Deficiency of other specified B group vitamins: Secondary | ICD-10-CM | POA: Diagnosis not present

## 2016-09-23 DIAGNOSIS — R0602 Shortness of breath: Secondary | ICD-10-CM | POA: Diagnosis not present

## 2016-09-23 DIAGNOSIS — J45909 Unspecified asthma, uncomplicated: Secondary | ICD-10-CM | POA: Diagnosis not present

## 2016-09-23 DIAGNOSIS — F419 Anxiety disorder, unspecified: Secondary | ICD-10-CM | POA: Insufficient documentation

## 2016-09-23 DIAGNOSIS — R0789 Other chest pain: Secondary | ICD-10-CM

## 2016-09-23 DIAGNOSIS — R002 Palpitations: Secondary | ICD-10-CM

## 2016-09-23 DIAGNOSIS — R079 Chest pain, unspecified: Secondary | ICD-10-CM | POA: Diagnosis present

## 2016-09-23 DIAGNOSIS — F17210 Nicotine dependence, cigarettes, uncomplicated: Secondary | ICD-10-CM | POA: Diagnosis not present

## 2016-09-23 LAB — CBC WITH DIFFERENTIAL/PLATELET
Basophils Absolute: 0 10*3/uL (ref 0.0–0.1)
Basophils Relative: 0 %
Eosinophils Absolute: 0.2 10*3/uL (ref 0.0–0.7)
Eosinophils Relative: 1 %
HCT: 37.1 % (ref 36.0–46.0)
Hemoglobin: 11.9 g/dL — ABNORMAL LOW (ref 12.0–15.0)
Lymphocytes Relative: 25 %
Lymphs Abs: 3 10*3/uL (ref 0.7–4.0)
MCH: 29.5 pg (ref 26.0–34.0)
MCHC: 32.1 g/dL (ref 30.0–36.0)
MCV: 92.1 fL (ref 78.0–100.0)
Monocytes Absolute: 0.8 10*3/uL (ref 0.1–1.0)
Monocytes Relative: 7 %
Neutro Abs: 7.8 10*3/uL — ABNORMAL HIGH (ref 1.7–7.7)
Neutrophils Relative %: 67 %
Platelets: 341 10*3/uL (ref 150–400)
RBC: 4.03 MIL/uL (ref 3.87–5.11)
RDW: 13.8 % (ref 11.5–15.5)
WBC: 11.8 10*3/uL — ABNORMAL HIGH (ref 4.0–10.5)

## 2016-09-23 LAB — COMPREHENSIVE METABOLIC PANEL
ALT: 16 U/L (ref 14–54)
AST: 11 U/L — ABNORMAL LOW (ref 15–41)
Albumin: 4 g/dL (ref 3.5–5.0)
Alkaline Phosphatase: 74 U/L (ref 38–126)
Anion gap: 10 (ref 5–15)
BUN: 12 mg/dL (ref 6–20)
CO2: 24 mmol/L (ref 22–32)
Calcium: 8.9 mg/dL (ref 8.9–10.3)
Chloride: 104 mmol/L (ref 101–111)
Creatinine, Ser: 0.92 mg/dL (ref 0.44–1.00)
GFR calc Af Amer: >60 mL/min (ref >60)
GFR calc non Af Amer: >60 mL/min (ref >60)
Glucose, Bld: 109 mg/dL — ABNORMAL HIGH (ref 65–99)
Potassium: 3.9 mmol/L (ref 3.5–5.1)
Sodium: 138 mmol/L (ref 135–145)
Total Bilirubin: 0.4 mg/dL (ref 0.3–1.2)
Total Protein: 7.8 g/dL (ref 6.5–8.1)

## 2016-09-23 LAB — D-DIMER, QUANTITATIVE: D-Dimer, Quant: <0.27 ug/mL-FEU (ref 0.00–0.50)

## 2016-09-23 LAB — TROPONIN I
Troponin I: <0.03 ng/mL (ref <0.03)
Troponin I: <0.03 ng/mL (ref <0.03)

## 2016-09-23 LAB — LIPASE, BLOOD: Lipase: 24 U/L (ref 11–51)

## 2016-09-23 MED ORDER — PAROXETINE HCL 20 MG PO TABS: 20.0000 mg | ORAL_TABLET | Freq: Every day | ORAL | 1 refills | Status: DC

## 2016-09-23 MED ORDER — CYANOCOBALAMIN 1000 MCG/ML IJ SOLN
1000.0000 ug | Freq: Once | INTRAMUSCULAR | Status: AC
  Administered 2016-09-23: 1000 ug via INTRAMUSCULAR

## 2016-09-23 MED ORDER — CLONAZEPAM 0.5 MG PO TABS: 0.5000 mg | ORAL_TABLET | Freq: Two times a day (BID) | ORAL | 1 refills | Status: DC | PRN

## 2016-09-23 MED ORDER — LORAZEPAM 2 MG/ML IJ SOLN
0.5000 mg | Freq: Once | INTRAMUSCULAR | Status: AC
  Administered 2016-09-23: 0.5 mg via INTRAVENOUS
  Filled 2016-09-23: qty 1

## 2016-09-23 MED ORDER — ONDANSETRON HCL 4 MG/2ML IJ SOLN
4.0000 mg | Freq: Once | INTRAMUSCULAR | Status: AC
  Administered 2016-09-23: 4 mg via INTRAVENOUS
  Filled 2016-09-23: qty 2

## 2016-09-23 MED ORDER — ALPRAZOLAM 0.5 MG PO TABS
0.5000 mg | ORAL_TABLET | Freq: Once | ORAL | Status: AC
  Administered 2016-09-23: 0.5 mg via ORAL
  Filled 2016-09-23: qty 1

## 2016-09-23 NOTE — Telephone Encounter (Signed)
Put her in a 4:15

## 2016-09-23 NOTE — Telephone Encounter (Signed)
This pt called back following up. I told her that the message was sent to Dr [name] and we would follow up as soon as we heard something. She was crying on the phone.

## 2016-09-23 NOTE — Telephone Encounter (Signed)
Pt was seen in the ER today  For Anxiety, this is second attack of anxiety in 3 weeks, they told her to Follow up with her primary as soon as possible, she would ike to know if she can be worked in today, she believes this is because Burns weaned her off of Paxil and she believe she needs to go back on it, she said she took her Xanax 3 or 4 hours ago and she is feeling the same way she was before she took it.    She would like a call back on what she can do, The Pt was very upset and crying on the phone with me not knowing what to do , because I advised her Dr, Quay Burow was full today.   Please advise

## 2016-09-23 NOTE — ED Provider Notes (Signed)
[name] DEPT Provider Note   CSN: [id number] Arrival date & time: 09/23/16  0241     History   Chief Complaint Chief Complaint  Patient presents with  . Chest Pain    nausea    HPI [name] is a 46 y.o. female.  Patient presents with concern for anxiety attack. States she woke from sleep about midnight with racing heart, intermittent pressure in her chest, nausea with dry heaves. States the pain is described as pressure in the center for chest pain about 5 minutes at a time and comes and goes. It does not radiate to her arms, back or neck. She feels like she is not getting enough air. She tried to take one of her Xanax is at home but was unable to because of anxiety. She does not take anxiety medication on a regular basis. She was in the ED 5 days ago for similar symptoms but states this is much worse. When dry heaves, racing heart, intermittent chest pressure denies any leg pain or leg swelling. Denies any recent travel. Had a reassuring stress test in January 2018.   The history is provided by the patient and the spouse.  Chest Pain   Associated symptoms include nausea and shortness of breath. Pertinent negatives include no abdominal pain, no back pain, no dizziness, no headaches, no vomiting and no weakness.    Past Medical History:  Diagnosis Date  . Anxiety   . ASTHMA   . DELAYED GASTRIC EMPTYING   . DEPRESSION   . GERD   . OBESITY   . OBSTRUCTIVE SLEEP APNEA   . POLYCYSTIC OVARIAN DISEASE   . SMOKER   . SVT (supraventricular tachycardia) ([place])   . Tachycardia   . Umbilical hernia   . Umbilical hernia   . Vitamin B12 deficiency 03/2015 dx   start IM replacement q 30d  . Vitamin D deficiency 03/2015 dx    Patient Active Problem List   Diagnosis Date Noted  . Family history of early CAD 03/23/2016  . SVT (supraventricular tachycardia) ([place]) 03/14/2016  . Adrenal nodule ([place]) 01/06/2016  . Nausea without vomiting 12/02/2015  . Benign tumor of adrenal  gland 12/02/2015  . Umbilical hernia 11/91/4782  . Glucose intolerance (impaired glucose tolerance) 04/09/2015  . Morbid obesity ([place]) 04/09/2015  . Vitamin D deficiency   . Vitamin B12 deficiency   . Anxiety   . Tobacco abuse 01/28/2009  . Depression 01/28/2009  . GERD 01/28/2009  . POLYCYSTIC OVARIAN DISEASE 02/10/2007  . Obstructive sleep apnea 02/10/2007  . Asthma 02/10/2007    Past Surgical History:  Procedure Laterality Date  . CESAREAN SECTION  97 & 98   x's 2  . Cocxyl removal  1995  . DILATION AND CURETTAGE OF UTERUS    . LAPAROSCOPY    . TUBAL LIGATION  1998    OB History    Gravida Para Term Preterm AB Living   3 2 2   1      SAB TAB Ectopic Multiple Live Births     1             Home Medications    Prior to Admission medications   Medication Sig Start Date End Date Taking? Authorizing Provider  Cholecalciferol (VITAMIN D) 2000 units tablet Take 4,000 Units by mouth daily.    Yes [provider]  metoprolol tartrate (LOPRESSOR) 25 MG tablet Take 25 mg by mouth 2 (two) times daily.   Yes [provider]  Naltrexone-Bupropion  HCl ER 8-90 MG TB12 1 tab po qam x 1 week, then 1 tab po bid x 1 week, then 2 tabs po qam and 1 tab po pm x 1 week, then 2 tabs po bid 06/23/16  Yes Burns, Claudina Lick, MD  albuterol (PROAIR HFA) 108 (90 BASE) MCG/ACT inhaler Inhale 2 puffs into the lungs every 4 (four) hours as needed. 01/02/14   Rowe Clack, MD  ALPRAZolam Duanne Moron) 0.5 MG tablet Take 1 tablet (0.5 mg total) by mouth every 8 (eight) hours as needed for sleep or anxiety. 09/19/16   Binnie Rail, MD  nitroGLYCERIN (NITROSTAT) 0.4 MG SL tablet Place 1 tablet (0.4 mg total) under the tongue every 5 (five) minutes as needed for chest pain. 09/28/33   Delora Fuel, MD  ondansetron (ZOFRAN) 4 MG tablet Take 1 tablet (4 mg total) by mouth every 6 (six) hours as needed for nausea. 5/97/41   Delora Fuel, MD  pantoprazole (PROTONIX) 40 MG tablet TAKE 1 TABLET (40 MG  TOTAL) BY MOUTH DAILY. 09/19/16   Mauri Pole, MD  valACYclovir (VALTREX) 1000 MG tablet Take two tablets ( total 2000 mg) by mouth q12h x 1 day; Start: ASAP after symptom onset Patient not taking: Reported on 09/18/2016 07/29/16   Binnie Rail, MD    Family History Family History  Problem Relation Age of Onset  . Breast cancer Mother        dx 11/2009  . Diabetes Maternal Aunt   . Prostate cancer Maternal Grandfather 80       also hx colon polyps  . Heart disease Paternal Grandmother   . Pancreatic cancer Paternal Grandmother 79  . Heart murmur Sister   . Adrenal disorder Neg Hx     Social History Social History  Substance Use Topics  . Smoking status: Current Some Day Smoker    Packs/day: 0.25    Types: Cigarettes    Start date: 10/23/1987  . Smokeless tobacco: Never Used  . Alcohol use 0.0 oz/week     Comment: Rarely     Allergies   Patient has no known allergies.   Review of Systems Review of Systems  Constitutional: Negative for activity change, appetite change and fatigue.  HENT: Negative for congestion and rhinorrhea.   Respiratory: Positive for chest tightness and shortness of breath.   Cardiovascular: Positive for chest pain. Negative for leg swelling.  Gastrointestinal: Positive for nausea. Negative for abdominal pain and vomiting.  Genitourinary: Negative for vaginal bleeding and vaginal discharge.  Musculoskeletal: Negative for arthralgias, back pain and myalgias.  Skin: Negative for rash.  Neurological: Negative for dizziness, weakness and headaches.   all other systems are negative except as noted in the HPI and PMH.     Physical Exam Updated Vital Signs BP (!) 110/58 (BP Location: Right Arm)   Pulse 89   Temp 98.3 F (36.8 C) (Oral)   Resp 20   Ht 5' 5"  (1.651 m)   Wt (!) 149.7 kg (330 lb)   LMP 09/11/2016 (Approximate) Comment: pt shielded  SpO2 100%   BMI 54.91 kg/m   Physical Exam  Constitutional: She is oriented to person,  place, and time. She appears well-developed and well-nourished. She appears distressed.  Anxious and tearful  HENT:  Head: Normocephalic and atraumatic.  Mouth/Throat: Oropharynx is clear and moist. No oropharyngeal exudate.  Eyes: Conjunctivae and EOM are normal. Pupils are equal, round, and reactive to light.  Neck: Normal range of motion. Neck supple.  No meningismus.  Cardiovascular: Normal rate, regular rhythm, normal heart sounds and intact distal pulses.   No murmur heard. Pulmonary/Chest: Effort normal and breath sounds normal. No respiratory distress. She exhibits no tenderness.  Abdominal: Soft. There is no tenderness. There is no rebound and no guarding.  Musculoskeletal: Normal range of motion. She exhibits no edema or tenderness.  Neurological: She is alert and oriented to person, place, and time. No cranial nerve deficit. She exhibits normal muscle tone. Coordination normal.   5/5 strength throughout. CN 2-12 intact.Equal grip strength.   Skin: Skin is warm.  Psychiatric: She has a normal mood and affect. Her behavior is normal.  Nursing note and vitals reviewed.    ED Treatments / Results  Labs (all labs ordered are listed, but only abnormal results are displayed) Labs Reviewed  CBC WITH DIFFERENTIAL/PLATELET - Abnormal; Notable for the following:       Result Value   WBC 11.8 (*)    Hemoglobin 11.9 (*)    Neutro Abs 7.8 (*)    All other components within normal limits  COMPREHENSIVE METABOLIC PANEL - Abnormal; Notable for the following:    Glucose, Bld 109 (*)    AST 11 (*)    All other components within normal limits  TROPONIN I  D-DIMER, QUANTITATIVE (NOT AT [facility])  LIPASE, BLOOD  TROPONIN I    EKG  EKG Interpretation  Date/Time:  Friday September 23 2016 02:52:39 EDT Ventricular Rate:  83 PR Interval:    QRS Duration: 96 QT Interval:  384 QTC Calculation: 452 R Axis:   47 Text Interpretation:  Sinus rhythm No significant change was found Confirmed by  Ezequiel Essex [phone]) on 09/23/2016 3:00:23 AM       Radiology Dg Chest 2 View  Result Date: 09/23/2016 CLINICAL DATA:  Panic attacks, mid upper chest pressure EXAM: CHEST  2 VIEW COMPARISON:  09/18/2016 FINDINGS: Mild opacity at the lingula, unchanged. No focal consolidation or effusion. Stable cardiomediastinal silhouette. No pneumothorax. IMPRESSION: No active cardiopulmonary disease. Electronically Signed   By: Donavan Foil M.D.   On: 09/23/2016 03:52    Procedures Procedures (including critical care time)  Medications Ordered in ED Medications  ondansetron (ZOFRAN) injection 4 mg (4 mg Intravenous Given 09/23/16 0331)  LORazepam (ATIVAN) injection 0.5 mg (0.5 mg Intravenous Given 09/23/16 0330)     Initial Impression / Assessment and Plan / ED Course  I have reviewed the triage vital signs and the nursing notes.  Pertinent labs & imaging results that were available during my care of the patient were reviewed by me and considered in my medical decision making (see chart for details).    Intermittent chest pressure with nausea, racing heart, palpitations. Patient is very anxious and tearful. EKG is normal sinus rhythm without acute ST changes.  Troponin negative. D-dimer negative. Patient much improved after IV Ativan. She did refuse to take by mouth medications.  Patient remains calm in the ED without further episodes of chest pressure, shortness of breath or dizziness. Suspect many of her symptoms are due to anxiety. Her Paxil was recently stopped because she started taking contrave which contains bupropion. She denies any suicidal or homicidal thoughts.  Second troponin negative. Heart score is 2. Low suspicion for ACS. Did have negative stress test 5 months ago. Doubt PE, aortic dissection. She is now willing to take PO xanax.  Discussed with patient she should take her Xanax as prescribed as needed for anxiety but should not take it if she  is working or driving.  Follow  up with PCP to discuss restarting paxil. Return precautions discussed.    Final Clinical Impressions(s) / ED Diagnoses   Final diagnoses:  Atypical chest pain  Anxiety    New Prescriptions New Prescriptions   No medications on file     Ezequiel Essex, MD 09/23/16 [phone]

## 2016-09-23 NOTE — Patient Instructions (Addendum)
Stop contrave.  Start paxil 20 mg at night   Start clonazepam twice daily.   Do not take alprazolam.   Once the paxil starts working well decrease the clonazepam to once daily.     Follow up in two weeks

## 2016-09-23 NOTE — Discharge Instructions (Signed)
There is no evidence of heart attack or blood clot in the lung. Take your anxiety medicine as prescribed as needed. Follow-up with your doctor to discuss stopping of the Contrave restarting your Paxil. Return to the ED if you develop worsening chest pain, shortness of breath or any other concerns.

## 2016-09-23 NOTE — Progress Notes (Signed)
Subjective:    Patient ID: [name], female    DOB: 1970/12/07, 46 y.o.   MRN: [id number]  HPI She is here for an acute visit.   She has had two severe episodes of anxiety/panic attack that led her to the ED.  She went 09/18/16 and 09/23/16.  The first episode she woke up with chest pain in the middle of the night.  She describes it as a vague pressure in her chest.  She has associated dyspnea, nausea.  The pain did not radiate.  She had a stress test in January and it was normal.  Her EKG showed no ST/ T changes.  One nitroglycerin relieved her chest pain.  Troponinx 2 were negative.  She received ativan, zofran, ASA, benadryl, reglan and nitrostat in the ED.  Today she went to the ED with chest pain, nausea and likely anxiety attack.  She woke up at night with a racing heart, chest pressure, nausea with dry heaves.  The chest pain was pressure in the center of the chest and lasted 5 minutes and was intermittent.  There was no radiation. She felt like she was not getting enough air.  She had an EKG, CXR, blood work, troponin, d-dimer and received lorazepam, zofran and xanax.  Her results were unremarkable.  She was diagnosed with anxiety.  It was recommended for her to take the xanax and f/u with me.   Contrave has caused some dizziness and anxiety.  She has not gotten up to the full dose.  We stopped her paxil prior to starting the contrave.  She was initially on paxil for her stomach issues.  She did not think she had this much anxiety.  She is afraid to take the xanax.  She states two days of mild cold symptoms of cough and wheeze.  She is still experiencing SOB, chest pressure and palpitations with the anxiety.  She has been having headaches and lightheadedness.    Medications and allergies reviewed with patient and updated if appropriate.  Patient Active Problem List   Diagnosis Date Noted  . Family history of early CAD 03/23/2016  . SVT (supraventricular tachycardia) ([place]) 03/14/2016  .  Adrenal nodule ([place]) 01/06/2016  . Nausea without vomiting 12/02/2015  . 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 Umbilical hernia 77/41/2878  . Glucose intolerance (impaired glucose tolerance) 04/09/2015  . Morbid obesity ([place]) 04/09/2015  . Vitamin D deficiency   . Vitamin B12 deficiency   . Anxiety   . Tobacco abuse 01/28/2009  . Depression 01/28/2009  . GERD 01/28/2009  . POLYCYSTIC OVARIAN DISEASE 02/10/2007  . Obstructive sleep apnea 02/10/2007  . Asthma 02/10/2007    Current Outpatient Prescriptions on File Prior to Visit  Medication Sig Dispense Refill  . albuterol (PROAIR HFA) 108 (90 BASE) MCG/ACT inhaler Inhale 2 puffs into the lungs every 4 (four) hours as needed. 18 g 2  . ALPRAZolam (XANAX) 0.5 MG tablet Take 1 tablet (0.5 mg total) by mouth every 8 (eight) hours as needed for sleep or anxiety. 30 tablet 0  . Cholecalciferol (VITAMIN D) 2000 units tablet Take 4,000 Units by mouth daily.     . metoprolol tartrate (LOPRESSOR) 25 MG tablet Take 25 mg by mouth 2 (two) times daily.    . Naltrexone-Bupropion HCl ER 8-90 MG TB12 1 tab po qam x 1 week, then 1 tab po bid x 1 week, then 2 tabs po qam and 1 tab po pm x 1 week, then  2 tabs po bid 70 tablet 0  . nitroGLYCERIN (NITROSTAT) 0.4 MG SL tablet Place 1 tablet (0.4 mg total) under the tongue every 5 (five) minutes as needed for chest pain. 30 tablet 0  . ondansetron (ZOFRAN) 4 MG tablet Take 1 tablet (4 mg total) by mouth every 6 (six) hours as needed for nausea. 12 tablet 0  . pantoprazole (PROTONIX) 40 MG tablet TAKE 1 TABLET (40 MG TOTAL) BY MOUTH DAILY. 30 tablet 1  . valACYclovir (VALTREX) 1000 MG tablet Take two tablets ( total 2000 mg) by mouth q12h x 1 day; Start: ASAP after symptom onset 6 tablet 2   Current Facility-Administered Medications on File Prior to Visit  Medication Dose Route Frequency Provider Last Rate Last Dose  . cyanocobalamin ((VITAMIN B-12)) injection 1,000 mcg  1,000 mcg Intramuscular Q30 days  Binnie Rail, MD        Past Medical History:  Diagnosis Date  . Anxiety   . ASTHMA   . DELAYED GASTRIC EMPTYING   . DEPRESSION   . GERD   . OBESITY   . OBSTRUCTIVE SLEEP APNEA   . POLYCYSTIC OVARIAN DISEASE   . SMOKER   . SVT (supraventricular tachycardia) ([place])   . Tachycardia   . Umbilical hernia   . Umbilical hernia   . Vitamin B12 deficiency 03/2015 dx   start IM replacement q 30d  . Vitamin D deficiency 03/2015 dx    Past Surgical History:  Procedure Laterality Date  . CESAREAN SECTION  97 & 98   x's 2  . Cocxyl removal  1995  . DILATION AND CURETTAGE OF UTERUS    . LAPAROSCOPY    . TUBAL LIGATION  1998    Social History   Social History  . Marital status: Married    Spouse name: N/A  . Number of children: 2  . 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 Smoking status: Current Some Day Smoker    Packs/day: 0.25    Types: Cigarettes    Start date: 10/23/1987  . Smokeless tobacco: Never Used  . Alcohol use 0.0 oz/week     Comment: Rarely  . Drug use: No  . Sexual activity: Not on file   Other Topics Concern  . Not on file   Social History Narrative   Lives with husband & 2 kids from 1st marriage. (1st husband expired 1998-leukemia) work as Merchandiser, retail, but currently unemployed    Family History  Problem Relation Age of Onset  . Breast cancer Mother        dx 11/2009  . Diabetes Maternal Aunt   . Prostate cancer Maternal Grandfather 18       also hx colon polyps  . Heart disease Paternal Grandmother   . Pancreatic cancer Paternal Grandmother 40  . Heart murmur Sister   . Adrenal disorder Neg Hx     Review of Systems  Constitutional: Negative for fever.  HENT: Positive for sore throat.   Respiratory: Positive for cough, shortness of breath (with anxiety) and wheezing (mild wheeze).   Cardiovascular: Positive for chest pain and palpitations. Negative for  leg swelling.  Neurological: Positive for light-headedness and headaches.  Psychiatric/Behavioral: The patient is nervous/anxious.        Objective:   Vitals:   09/23/16 1617  BP: 128/82  Pulse: 76  Resp: 16  Temp: 97.8  F (36.6 C)   Filed Weights   09/23/16 1617  Weight: (!) 337 lb (152.9 kg)   Body mass index is 56.08 kg/m.  Wt Readings from Last 3 Encounters:  09/23/16 (!) 337 lb (152.9 kg)  09/23/16 (!) 330 lb (149.7 kg)  09/18/16 (!) 335 lb (152 kg)     Physical Exam Constitutional: Appears well-developed and well-nourished. No distress.  HENT:  Head: Normocephalic and atraumatic.  Neck: Neck supple. No tracheal deviation present. No thyromegaly present.  No cervical lymphadenopathy Cardiovascular: Normal rate, regular rhythm and normal heart sounds.   No murmur heard. No carotid bruit .  No edema Pulmonary/Chest: Effort normal and breath sounds normal. No respiratory distress. No has no wheezes. No rales.  Skin: Skin is warm and dry. Not diaphoretic.  Psychiatric: very anxious mood and affect. Crying intermittently.           Assessment & Plan:   See Problem List for Assessment and Plan of chronic medical problems.

## 2016-09-23 NOTE — ED Triage Notes (Signed)
Pt states she started feeling like a panic attack at 2230 last night, felt some sob at that time, dizzy and nauseated.  Pt states she has been on Contrave for 3 weeks and is wondering if it is causing her to feel bad.  Pt reports some chest pressure but no pain.

## 2016-09-23 NOTE — Telephone Encounter (Signed)
Scheduled

## 2016-09-23 NOTE — Telephone Encounter (Signed)
Please advise 

## 2016-09-24 ENCOUNTER — Encounter: Payer: Self-pay | Admitting: Internal Medicine

## 2016-09-24 DIAGNOSIS — R002 Palpitations: Secondary | ICD-10-CM | POA: Insufficient documentation

## 2016-09-24 DIAGNOSIS — R0602 Shortness of breath: Secondary | ICD-10-CM | POA: Insufficient documentation

## 2016-09-24 DIAGNOSIS — R0789 Other chest pain: Secondary | ICD-10-CM | POA: Insufficient documentation

## 2016-09-24 NOTE — Assessment & Plan Note (Signed)
Related to anxiety and panic attacks Stress test negative in January 2018 Evaluation in ED x 2 negative for ACS Will be aggressive with anxiety treatment Reassured patient

## 2016-09-24 NOTE — Assessment & Plan Note (Signed)
Uncontrolled Likely related to contrave and coming off paxil - I think the contrave is part of the cause and since she is having some side effects we will discontinue this Restart paxil 20 mg daily Hold xanax Start clonazepam BID for now and will taper off once paxil is in her system F/u in 2 weeks, sooner if needed

## 2016-09-24 NOTE — Assessment & Plan Note (Signed)
Due for B12 injection - will give today

## 2016-09-27 ENCOUNTER — Encounter: Payer: Self-pay | Admitting: Emergency Medicine

## 2016-09-27 ENCOUNTER — Emergency Department (INDEPENDENT_AMBULATORY_CARE_PROVIDER_SITE_OTHER)
Admission: EM | Admit: 2016-09-27 | Discharge: 2016-09-27 | Disposition: A | Discharge status: Home / Self Care | Payer: BLUE CROSS/BLUE SHIELD | Source: Home / Self Care | Attending: Family Medicine | Admitting: Family Medicine

## 2016-09-27 DIAGNOSIS — B9789 Other viral agents as the cause of diseases classified elsewhere: Secondary | ICD-10-CM

## 2016-09-27 DIAGNOSIS — J069 Acute upper respiratory infection, unspecified: Secondary | ICD-10-CM

## 2016-09-27 MED ORDER — TRIAMCINOLONE ACETONIDE 40 MG/ML IJ SUSP
40.0000 mg | Freq: Once | INTRAMUSCULAR | Status: AC
  Administered 2016-09-27: 40 mg via INTRAMUSCULAR

## 2016-09-27 MED ORDER — AZITHROMYCIN 250 MG PO TABS: ORAL_TABLET | ORAL | 0 refills | Status: DC

## 2016-09-27 NOTE — ED Triage Notes (Signed)
Pt c/o dry cough and sore throat x4 days. No fever

## 2016-09-27 NOTE — Discharge Instructions (Signed)
Take plain guaifenesin (1235m extended release tabs such as Mucinex) with plenty of water, in the morning for cough and congestion.  Take Mucinex DM at bedtime.  Get adequate rest.   May use Afrin nasal spray (or generic oxymetazoline) each morning for about 5 days and then discontinue.  Also recommend using saline nasal spray several times daily and saline nasal irrigation (AYR is a common brand).  Use Flonase nasal spray each morning after using Afrin nasal spray and saline nasal irrigation. Try warm salt water gargles for sore throat.  Stop all antihistamines for now, and other non-prescription cough/cold preparations.

## 2016-09-27 NOTE — ED Provider Notes (Signed)
[name]    CSN: [id number] Arrival date & time: 09/27/16  1927     History   Chief Complaint Chief Complaint  Patient presents with  . Cough    HPI [name] is a 46 y.o. female.   Patient developed a sore throat about one week ago, followed by a non-productive cough, worse at night, four days ago.  Her left ear feels clogged.  She has developed headache today.   She has a history of OSA and past history of pneumonial      Past Medical History:  Diagnosis Date  . Anxiety   . ASTHMA   . DELAYED GASTRIC EMPTYING   . DEPRESSION   . GERD   . OBESITY   . OBSTRUCTIVE SLEEP APNEA   . POLYCYSTIC OVARIAN DISEASE   . SMOKER   . SVT (supraventricular tachycardia) ([place])   . Tachycardia   . Umbilical hernia   . Umbilical hernia   . Vitamin B12 deficiency 03/2015 dx   start IM replacement q 30d  . Vitamin D deficiency 03/2015 dx    Patient Active Problem List   Diagnosis Date Noted  . SOB (shortness of breath) 09/24/2016  . Palpitations 09/24/2016  . Chest pressure 09/24/2016  . Family history of early CAD 03/23/2016  . SVT (supraventricular tachycardia) ([place]) 03/14/2016  . Adrenal nodule ([place]) 01/06/2016  . Nausea without vomiting 12/02/2015  . 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 Umbilical hernia 07/68/0881  . Glucose intolerance (impaired glucose tolerance) 04/09/2015  . Morbid obesity ([place]) 04/09/2015  . Vitamin D deficiency   . Vitamin B12 deficiency   . Anxiety   . Tobacco abuse 01/28/2009  . Depression 01/28/2009  . GERD 01/28/2009  . POLYCYSTIC OVARIAN DISEASE 02/10/2007  . Obstructive sleep apnea 02/10/2007  . Asthma 02/10/2007    Past Surgical History:  Procedure Laterality Date  . CESAREAN SECTION  97 & 98   x's 2  . Cocxyl removal  1995  . DILATION AND CURETTAGE OF UTERUS    . LAPAROSCOPY    . TUBAL LIGATION  1998    OB History    Gravida Para Term Preterm AB Living   3 2 2   1      SAB TAB Ectopic Multiple Live  Births     1             Home Medications    Prior to Admission medications   Medication Sig Start Date End Date Taking? Authorizing Provider  albuterol (PROAIR HFA) 108 (90 BASE) MCG/ACT inhaler Inhale 2 puffs into the lungs every 4 (four) hours as needed. 01/02/14   Rowe Clack, MD  azithromycin (ZITHROMAX Z-PAK) 250 MG tablet Take 2 tabs today; then begin one tab once daily for 4 more days. 09/27/16   Kandra Nicolas, MD  Cholecalciferol (VITAMIN D) 2000 units tablet Take 4,000 Units by mouth daily.     [provider]  clonazePAM (KLONOPIN) 0.5 MG tablet Take 1 tablet (0.5 mg total) by mouth 2 (two) times daily as needed for anxiety. 09/23/16   Binnie Rail, MD  metoprolol tartrate (LOPRESSOR) 25 MG tablet Take 25 mg by mouth 2 (two) times daily.    [provider]  nitroGLYCERIN (NITROSTAT) 0.4 MG SL tablet Place 1 tablet (0.4 mg total) under the tongue every 5 (five) minutes as needed for chest pain. 04/13/13   Delora Fuel, MD  ondansetron (ZOFRAN) 4 MG tablet Take 1 tablet (4  mg total) by mouth every 6 (six) hours as needed for nausea. 1/60/10   Delora Fuel, MD  pantoprazole (PROTONIX) 40 MG tablet TAKE 1 TABLET (40 MG TOTAL) BY MOUTH DAILY. 09/19/16   Mauri Pole, MD  PARoxetine (PAXIL) 20 MG tablet Take 1 tablet (20 mg total) by mouth daily. 09/23/16   Binnie Rail, MD  valACYclovir (VALTREX) 1000 MG tablet Take two tablets ( total 2000 mg) by mouth q12h x 1 day; Start: ASAP after symptom onset 07/29/16   Binnie Rail, MD    Family History Family History  Problem Relation Age of Onset  . Breast cancer Mother        dx 11/2009  . Diabetes Maternal Aunt   . Prostate cancer Maternal Grandfather 52       also hx colon polyps  . Heart disease Paternal Grandmother   . Pancreatic cancer Paternal Grandmother 10  . Heart murmur Sister   . Adrenal disorder Neg Hx     Social History Social History  Substance Use Topics  . Smoking status: Current  Some Day Smoker    Packs/day: 0.25    Types: Cigarettes    Start date: 10/23/1987  . Smokeless tobacco: Never Used  . Alcohol use 0.0 oz/week     Comment: Rarely     Allergies   Patient has no known allergies.   Review of Systems Review of Systems + sore throat + cough No pleuritic pain No wheezing No nasal congestion + post-nasal drainage No sinus pain/pressure No itchy/red eyes ? left earache No hemoptysis No SOB No fever, + chills No nausea No vomiting No abdominal pain No diarrhea No urinary symptoms No skin rash + fatigue No myalgias + headache Used OTC meds without relief   Physical Exam Triage Vital Signs ED Triage Vitals  Enc Vitals Group     BP 09/27/16 1951 136/90     Pulse Rate 09/27/16 1951 78     Resp --      Temp 09/27/16 1951 97.7 F (36.5 C)     Temp Source 09/27/16 1951 Oral     SpO2 09/27/16 1951 96 %     Weight 09/27/16 1952 (!) 337 lb (152.9 kg)     Height --      Head Circumference --      Peak Flow --      Pain Score 09/27/16 1952 0     Pain Loc --      Pain Edu? --      Excl. in [place]? --    No data found.   Updated Vital Signs BP 136/90 (BP Location: Right Arm)   Pulse 78   Temp 97.7 F (36.5 C) (Oral)   Wt (!) 337 lb (152.9 kg)   LMP 09/11/2016 (Approximate) Comment: pt shielded  SpO2 96%   BMI 56.08 kg/m   Visual Acuity Right Eye Distance:   Left Eye Distance:   Bilateral Distance:    Right Eye Near:   Left Eye Near:    Bilateral Near:     Physical Exam Nursing notes and Vital Signs reviewed. Appearance:  Patient appears stated age, and in no acute distress Eyes:  Pupils are equal, round, and reactive to light and accomodation.  Extraocular movement is intact.  Conjunctivae are not inflamed  Ears:  Canals normal.  Tympanic membranes normal.  Nose:  Mildly congested turbinates.  No sinus tenderness.   Pharynx:  Normal Neck:  Supple.  Enlarged posterior/lateral nodes are  palpated bilaterally, tender to  palpation on the left.   Lungs:  Clear to auscultation.  Breath sounds are equal.  Moving air well. Heart:  Regular rate and rhythm without murmurs, rubs, or gallops.  Abdomen:  Nontender without masses or hepatosplenomegaly.  Bowel sounds are present.  No CVA or flank tenderness.  Extremities:  No edema.  Skin:  No rash present.    UC Treatments / Results  Labs (all labs ordered are listed, but only abnormal results are displayed) Labs Reviewed - No data to display  EKG  EKG Interpretation None       Radiology No results found.  Procedures Procedures (including critical care time)  Medications Ordered in UC Medications  triamcinolone acetonide (KENALOG-40) injection 40 mg (40 mg Intramuscular Given 09/27/16 2029)     Initial Impression / Assessment and Plan / UC Course  I have reviewed the triage vital signs and the nursing notes.  Pertinent labs & imaging results that were available during my care of the patient were reviewed by me and considered in my medical decision making (see chart for details).    Administered Kenalog 41m IM  Begin Z-pak for atypical coverage. Take plain guaifenesin (12088mextended release tabs such as Mucinex) with plenty of water, in the morning for cough and congestion.  Take Mucinex DM at bedtime.  Get adequate rest.   May use Afrin nasal spray (or generic oxymetazoline) each morning for about 5 days and then discontinue.  Also recommend using saline nasal spray several times daily and saline nasal irrigation (AYR is a common brand).  Use Flonase nasal spray each morning after using Afrin nasal spray and saline nasal irrigation. Try warm salt water gargles for sore throat.  Stop all antihistamines for now, and other non-prescription cough/cold preparations. Followup with Family Doctor if not improved in about 10 days.    Final Clinical Impressions(s) / UC Diagnoses   Final diagnoses:  Viral URI with cough    New Prescriptions New  Prescriptions   AZITHROMYCIN (ZITHROMAX Z-PAK) 250 MG TABLET    Take 2 tabs today; then begin one tab once daily for 4 more days.     BeKandra NicolasMD 10/01/16 2153

## 2016-09-30 ENCOUNTER — Ambulatory Visit: Payer: BLUE CROSS/BLUE SHIELD | Admitting: Internal Medicine

## 2016-09-30 ENCOUNTER — Ambulatory Visit: Payer: BLUE CROSS/BLUE SHIELD | Admitting: Adult Health

## 2016-10-15 NOTE — Progress Notes (Signed)
Subjective:    Patient ID: [name], female    DOB: [birth year], 46 y.o.   MRN: [id number]  HPI The patient is here for follow up.  Anxiety/panic attacks:  She is here for follow up.  Three weeks ago we restarted her paxil and put her on clonazepam twice daily.  She is taking the paxil daily as prescribed.  She feels much better.  She is taking the clonazepam twice daily.  She still feels some chest pain, palpitations and SOB.  She is still anxious.  She denies depression.    She still has chest pain.  She was supposed to schedule a follow up with her cardiologist but has not yet.  She has chest pain frequently - it can wake her up.  It can occur at rest.  She feels her heart skip a beat on occasion.  She has occasional palpitations that are transient -  It does not feel like her SVT.   Medications and allergies reviewed with patient and updated if appropriate.  Patient Active Problem List   Diagnosis Date Noted  . SOB (shortness of breath) 09/24/2016  . Palpitations 09/24/2016  . Chest pressure 09/24/2016  . Family history of early CAD 03/23/2016  . SVT (supraventricular tachycardia) ([place]) 03/14/2016  . Adrenal nodule ([place]) 01/06/2016  . Nausea without vomiting 12/02/2015  . 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 Umbilical hernia 70/62/3762  . Glucose intolerance (impaired glucose tolerance) 04/09/2015  . Morbid obesity ([place]) 04/09/2015  . Vitamin D deficiency   . Vitamin B12 deficiency   . Anxiety   . Tobacco abuse 01/28/2009  . Depression 01/28/2009  . GERD 01/28/2009  . POLYCYSTIC OVARIAN DISEASE 02/10/2007  . Obstructive sleep apnea 02/10/2007  . Asthma 02/10/2007    Current Outpatient Prescriptions on File Prior to Visit  Medication Sig Dispense Refill  . albuterol (PROAIR HFA) 108 (90 BASE) MCG/ACT inhaler Inhale 2 puffs into the lungs every 4 (four) hours as needed. 18 g 2  . Cholecalciferol (VITAMIN D) 2000 units tablet Take 4,000 Units by mouth daily.       . clonazePAM (KLONOPIN) 0.5 MG tablet Take 1 tablet (0.5 mg total) by mouth 2 (two) times daily as needed for anxiety. 60 tablet 1  . metoprolol tartrate (LOPRESSOR) 25 MG tablet Take 25 mg by mouth 2 (two) times daily.    . ondansetron (ZOFRAN) 4 MG tablet Take 1 tablet (4 mg total) by mouth every 6 (six) hours as needed for nausea. 12 tablet 0  . pantoprazole (PROTONIX) 40 MG tablet TAKE 1 TABLET (40 MG TOTAL) BY MOUTH DAILY. 30 tablet 1  . PARoxetine (PAXIL) 20 MG tablet Take 1 tablet (20 mg total) by mouth daily. 90 tablet 1  . valACYclovir (VALTREX) 1000 MG tablet Take two tablets ( total 2000 mg) by mouth q12h x 1 day; Start: ASAP after symptom onset 6 tablet 2   Current Facility-Administered Medications on File Prior to Visit  Medication Dose Route Frequency Provider Last Rate Last Dose  . cyanocobalamin ((VITAMIN B-12)) injection 1,000 mcg  1,000 mcg Intramuscular Q30 days Binnie Rail, MD        Past Medical History:  Diagnosis Date  . Anxiety   . ASTHMA   . DELAYED GASTRIC EMPTYING   . DEPRESSION   . GERD   . OBESITY   . OBSTRUCTIVE SLEEP APNEA   . POLYCYSTIC OVARIAN DISEASE   . SMOKER   . SVT (supraventricular tachycardia) ([place])   .  Tachycardia   . Umbilical hernia   . Umbilical hernia   . Vitamin B12 deficiency 03/2015 dx   start IM replacement q 30d  . Vitamin D deficiency 03/2015 dx    Past Surgical History:  Procedure Laterality Date  . CESAREAN SECTION  97 & 98   x's 2  . Cocxyl removal  1995  . DILATION AND CURETTAGE OF UTERUS    . LAPAROSCOPY    . TUBAL LIGATION  1998    Social History   Social History  . Marital status: Married    Spouse name: N/A  . Number of children: 2  . 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 Smoking status: Current Some Day Smoker    Packs/day: 0.25    Types: Cigarettes    Start date: 10/23/1987  . Smokeless tobacco: Never Used  . Alcohol  use 0.0 oz/week     Comment: Rarely  . Drug use: No  . Sexual activity: Not on file   Other Topics Concern  . Not on file   Social History Narrative   Lives with husband & 2 kids from 1st marriage. (1st husband expired 1998-leukemia) work as Merchandiser, retail, but currently unemployed    Family History  Problem Relation Age of Onset  . Breast cancer Mother        dx 11/2009  . Diabetes Maternal Aunt   . Prostate cancer Maternal Grandfather 66       also hx colon polyps  . Heart disease Paternal Grandmother   . Pancreatic cancer Paternal Grandmother 67  . Heart murmur Sister   . Adrenal disorder Neg Hx     Review of Systems  Constitutional: Negative for fever.  Respiratory: Positive for shortness of breath. Negative for cough and wheezing.   Cardiovascular: Positive for chest pain and palpitations. Negative for leg swelling.  Gastrointestinal: Positive for nausea.  Neurological: Negative for light-headedness and headaches.       Objective:   Vitals:   10/17/16 0839  BP: (!) 154/98  Pulse: 73  Resp: 16  Temp: 97.7 F (36.5 C)   Wt Readings from Last 3 Encounters:  10/17/16 (!) 332 lb (150.6 kg)  09/27/16 (!) 337 lb (152.9 kg)  09/23/16 (!) 337 lb (152.9 kg)   Body mass index is 55.25 kg/m.   Physical Exam    Constitutional: Appears well-developed and well-nourished. No distress.  HENT:  Head: Normocephalic and atraumatic.  Neck: Neck supple. No tracheal deviation present. No thyromegaly present.  No cervical lymphadenopathy Cardiovascular: Normal rate, regular rhythm and normal heart sounds.   No murmur heard. No carotid bruit .  No edema Pulmonary/Chest: Effort normal and breath sounds normal. No respiratory distress. No has no wheezes. No rales.  Skin: Skin is warm and dry. Not diaphoretic.  Psychiatric: Normal mood and affect. Behavior is normal.      Assessment & Plan:    See Problem List for Assessment and Plan of chronic medical problems.     FU in 2-3 weeks

## 2016-10-17 ENCOUNTER — Encounter: Payer: Self-pay | Admitting: Internal Medicine

## 2016-10-17 ENCOUNTER — Ambulatory Visit (INDEPENDENT_AMBULATORY_CARE_PROVIDER_SITE_OTHER): Payer: BLUE CROSS/BLUE SHIELD | Admitting: Internal Medicine

## 2016-10-17 VITALS — BP 136/92 | HR 73 | Temp 97.7°F | Resp 16 | Wt 332.0 lb

## 2016-10-17 DIAGNOSIS — F32A Depression, unspecified: Secondary | ICD-10-CM

## 2016-10-17 DIAGNOSIS — R0789 Other chest pain: Secondary | ICD-10-CM | POA: Diagnosis not present

## 2016-10-17 DIAGNOSIS — F419 Anxiety disorder, unspecified: Secondary | ICD-10-CM | POA: Diagnosis not present

## 2016-10-17 DIAGNOSIS — F329 Major depressive disorder, single episode, unspecified: Secondary | ICD-10-CM

## 2016-10-17 NOTE — Assessment & Plan Note (Signed)
Improved, but still not ideally controlled Will continue current dose of paxil since she has been back on it less than 4 weeks - follow up in 2-3 weeks to re-evaluate if she needs an increase in medication Try decreasing clonazepam to once daily -- ideally would like to get her off of this Follow up sooner if needed

## 2016-10-17 NOTE — Assessment & Plan Note (Signed)
Still with chest pain / pressure intermittently - likely not cardiac Has intermittent palpitations, SOB Will follow up with cardiology

## 2016-10-17 NOTE — Patient Instructions (Signed)
  Medications reviewed and updated.  Changes include decreasing clonazepam to once a day.      Please followup in 2-3 weeks

## 2016-10-17 NOTE — Assessment & Plan Note (Signed)
Denies depression

## 2016-10-28 ENCOUNTER — Ambulatory Visit: Payer: BLUE CROSS/BLUE SHIELD

## 2016-11-04 ENCOUNTER — Ambulatory Visit: Payer: BLUE CROSS/BLUE SHIELD

## 2016-11-10 NOTE — Progress Notes (Deleted)
Subjective:    Patient ID: [name], female    DOB: 07-Aug-1970, 46 y.o.   MRN: [id number]  HPI The patient is here for follow up.  GERD:  She is taking her medication daily as prescribed.  She denies any GERD symptoms and feels her GERD is well controlled.   Asthma:   Anxiety, depression:   `B12 def:  S he is getting monthly B12 injections  Medications and allergies reviewed with patient and updated if appropriate.  Patient Active Problem List   Diagnosis Date Noted  . SOB (shortness of breath) 09/24/2016  . Palpitations 09/24/2016  . Chest pressure 09/24/2016  . Family history of early CAD 03/23/2016  . SVT (supraventricular tachycardia) ([place]) 03/14/2016  . Adrenal nodule ([place]) 01/06/2016  . Nausea without vomiting 12/02/2015  . 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 Umbilical hernia 40/11/6759  . Glucose intolerance (impaired glucose tolerance) 04/09/2015  . Morbid obesity ([place]) 04/09/2015  . Vitamin D deficiency   . Vitamin B12 deficiency   . Anxiety   . Tobacco abuse 01/28/2009  . Depression 01/28/2009  . GERD 01/28/2009  . POLYCYSTIC OVARIAN DISEASE 02/10/2007  . Obstructive sleep apnea 02/10/2007  . Asthma 02/10/2007    Current Outpatient Prescriptions on File Prior to Visit  Medication Sig Dispense Refill  . albuterol (PROAIR HFA) 108 (90 BASE) MCG/ACT inhaler Inhale 2 puffs into the lungs every 4 (four) hours as needed. 18 g 2  . Cholecalciferol (VITAMIN D) 2000 units tablet Take 4,000 Units by mouth daily.     . clonazePAM (KLONOPIN) 0.5 MG tablet Take 1 tablet (0.5 mg total) by mouth 2 (two) times daily as needed for anxiety. 60 tablet 1  . metoprolol tartrate (LOPRESSOR) 25 MG tablet Take 25 mg by mouth 2 (two) times daily.    . ondansetron (ZOFRAN) 4 MG tablet Take 1 tablet (4 mg total) by mouth every 6 (six) hours as needed for nausea. 12 tablet 0  . pantoprazole (PROTONIX) 40 MG tablet TAKE 1 TABLET (40 MG TOTAL) BY MOUTH DAILY. 30 tablet 1    . PARoxetine (PAXIL) 20 MG tablet Take 1 tablet (20 mg total) by mouth daily. 90 tablet 1  . valACYclovir (VALTREX) 1000 MG tablet Take two tablets ( total 2000 mg) by mouth q12h x 1 day; Start: ASAP after symptom onset 6 tablet 2   Current Facility-Administered Medications on File Prior to Visit  Medication Dose Route Frequency Provider Last Rate Last Dose  . cyanocobalamin ((VITAMIN B-12)) injection 1,000 mcg  1,000 mcg Intramuscular Q30 days Binnie Rail, MD        Past Medical History:  Diagnosis Date  . Anxiety   . ASTHMA   . DELAYED GASTRIC EMPTYING   . DEPRESSION   . GERD   . OBESITY   . OBSTRUCTIVE SLEEP APNEA   . POLYCYSTIC OVARIAN DISEASE   . SMOKER   . SVT (supraventricular tachycardia) ([place])   . Tachycardia   . Umbilical hernia   . Umbilical hernia   . Vitamin B12 deficiency 03/2015 dx   start IM replacement q 30d  . Vitamin D deficiency 03/2015 dx    Past Surgical History:  Procedure Laterality Date  . CESAREAN SECTION  97 & 98   x's 2  . Cocxyl removal  1995  . DILATION AND CURETTAGE OF UTERUS    . LAPAROSCOPY    . TUBAL LIGATION  1998    Social History   Social History  .  Marital status: Married    Spouse name: N/A  . Number of children: 2  . 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 Smoking status: Current Some Day Smoker    Packs/day: 0.25    Types: Cigarettes    Start date: 10/23/1987  . Smokeless tobacco: Never Used  . Alcohol use 0.0 oz/week     Comment: Rarely  . Drug use: No  . Sexual activity: Not on file   Other Topics Concern  . Not on file   Social History Narrative   Lives with husband & 2 kids from 1st marriage. (1st husband expired 1998-leukemia) work as Merchandiser, retail, but currently unemployed    Family History  Problem Relation Age of Onset  . Breast cancer Mother        dx 11/2009  . Diabetes Maternal Aunt   . Prostate cancer  Maternal Grandfather 39       also hx colon polyps  . Heart disease Paternal Grandmother   . Pancreatic cancer Paternal Grandmother 81  . Heart murmur Sister   . Adrenal disorder Neg Hx     Review of Systems     Objective:  There were no vitals filed for this visit. Wt Readings from Last 3 Encounters:  10/17/16 (!) 332 lb (150.6 kg)  09/27/16 (!) 337 lb (152.9 kg)  09/23/16 (!) 337 lb (152.9 kg)   There is no height or weight on file to calculate BMI.   Physical Exam    Constitutional: Appears well-developed and well-nourished. No distress.  HENT:  Head: Normocephalic and atraumatic.  Neck: Neck supple. No tracheal deviation present. No thyromegaly present.  No cervical lymphadenopathy Cardiovascular: Normal rate, regular rhythm and normal heart sounds.   No murmur heard. No carotid bruit .  No edema Pulmonary/Chest: Effort normal and breath sounds normal. No respiratory distress. No has no wheezes. No rales.  Skin: Skin is warm and dry. Not diaphoretic.  Psychiatric: Normal mood and affect. Behavior is normal.      Assessment & Plan:    See Problem List for Assessment and Plan of chronic medical problems.

## 2016-11-11 ENCOUNTER — Ambulatory Visit: Payer: BLUE CROSS/BLUE SHIELD | Admitting: Internal Medicine

## 2016-11-23 ENCOUNTER — Ambulatory Visit: Payer: BLUE CROSS/BLUE SHIELD | Admitting: Internal Medicine

## 2016-11-24 NOTE — Progress Notes (Signed)
Subjective:    Patient ID: [name], female    DOB: [birth year], 46 y.o.   MRN: [id number]  HPI The patient is here for follow up.  Anxiety:  She is taking the paxil daily as prescribed.  She feels her anxiety is controlled overall.  She feels anxious at times at night - she listens to music or plays games and that helps.  In the day she did have a anxiety attack and ended up going to the emergency room. This occurred after her patient died.  She went to the ED 12-20-2022 for chest pain, tingling in the left arm.  Her chest pain started one hour prior to going to the ED.  It was a vague discomfort and resolved spontaneously.  She did have some tingling in her left arm. She did not have numbness or weakness in the arm.  She had a patient die that day and she was very upset.   Blood work, urine, cxr, ekg were all within normal limits.  She has had a complete cardiac workup recently and everything was normal. She was reassured that this episode was related to anxiety. She has not had any concerning symptoms since.  She has not yet used her cpap machine due to anxiety and claustrophobia. The first time she went to the emergency room with severe anxiety was related to using her CPAP machine, which caused increased anxiety and claustrophobia. She did try to use it once since then, but it caused anxiety. She has always been very compliant with her CPAP machine and does want to get back to using it because she slept so well, but isn't nervous changes. She will eventually need new supplies.   B12 deficiency:  She gets B12 injections monthly and is do for an injection.  She would like to have that done today.      Medications and allergies reviewed with patient and updated if appropriate.  Patient Active Problem List   Diagnosis Date Noted  . SOB (shortness of breath) 09/24/2016  . Palpitations 09/24/2016  . Chest pressure 09/24/2016  . Family history of early CAD 03/23/2016  . SVT  (supraventricular tachycardia) ([place]) 03/14/2016  . Adrenal nodule ([place]) 01/06/2016  . Nausea without vomiting 12/02/2015  . 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 Umbilical hernia 11/21/8869  . Glucose intolerance (impaired glucose tolerance) 04/09/2015  . Morbid obesity ([place]) 04/09/2015  . Vitamin D deficiency   . Vitamin B12 deficiency   . Anxiety   . Tobacco abuse 01/28/2009  . Depression 01/28/2009  . GERD 01/28/2009  . POLYCYSTIC OVARIAN DISEASE 02/10/2007  . Obstructive sleep apnea 02/10/2007  . Asthma 02/10/2007    Current Outpatient Prescriptions on File Prior to Visit  Medication Sig Dispense Refill  . albuterol (PROAIR HFA) 108 (90 BASE) MCG/ACT inhaler Inhale 2 puffs into the lungs every 4 (four) hours as needed. 18 g 2  . Cholecalciferol (VITAMIN D) 2000 units tablet Take 4,000 Units by mouth daily.     . clonazePAM (KLONOPIN) 0.5 MG tablet Take 1 tablet (0.5 mg total) by mouth 2 (two) times daily as needed for anxiety. 60 tablet 1  . metoprolol tartrate (LOPRESSOR) 25 MG tablet Take 25 mg by mouth 2 (two) times daily.    . ondansetron (ZOFRAN) 4 MG tablet Take 1 tablet (4 mg total) by mouth every 6 (six) hours as needed for nausea. 12 tablet 0  . pantoprazole (PROTONIX) 40 MG tablet TAKE 1 TABLET (40 MG  TOTAL) BY MOUTH DAILY. 30 tablet 1  . PARoxetine (PAXIL) 20 MG tablet Take 1 tablet (20 mg total) by mouth daily. 90 tablet 1  . valACYclovir (VALTREX) 1000 MG tablet Take two tablets ( total 2000 mg) by mouth q12h x 1 day; Start: ASAP after symptom onset 6 tablet 2   Current Facility-Administered Medications on File Prior to Visit  Medication Dose Route Frequency Provider Last Rate Last Dose  . cyanocobalamin ((VITAMIN B-12)) injection 1,000 mcg  1,000 mcg Intramuscular Q30 days Binnie Rail, MD        Past Medical History:  Diagnosis Date  . Anxiety   . ASTHMA   . DELAYED GASTRIC EMPTYING   . DEPRESSION   . GERD   . OBESITY   . OBSTRUCTIVE SLEEP APNEA     . POLYCYSTIC OVARIAN DISEASE   . SMOKER   . SVT (supraventricular tachycardia) ([place])   . Tachycardia   . Umbilical hernia   . Umbilical hernia   . Vitamin B12 deficiency 03/2015 dx   start IM replacement q 30d  . Vitamin D deficiency 03/2015 dx    Past Surgical History:  Procedure Laterality Date  . CESAREAN SECTION  97 & 98   x's 2  . Cocxyl removal  1995  . DILATION AND CURETTAGE OF UTERUS    . LAPAROSCOPY    . TUBAL LIGATION  1998    Social History   Social History  . Marital status: Married    Spouse name: N/A  . Number of children: 2  . 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 Smoking status: Current Some Day Smoker    Packs/day: 0.25    Types: Cigarettes    Start date: 10/23/1987  . Smokeless tobacco: Never Used  . Alcohol use 0.0 oz/week     Comment: Rarely  . Drug use: No  . Sexual activity: Not Asked   Other Topics Concern  . None   Social History Narrative   Lives with husband & 2 kids from 1st marriage. (1st husband expired 1998-leukemia) work as Merchandiser, retail, but currently unemployed    Family History  Problem Relation Age of Onset  . Breast cancer Mother        dx 11/2009  . Diabetes Maternal Aunt   . Prostate cancer Maternal Grandfather 18       also hx colon polyps  . Heart disease Paternal Grandmother   . Pancreatic cancer Paternal Grandmother 17  . Heart murmur Sister   . Adrenal disorder Neg Hx     Review of Systems  Respiratory: Negative for shortness of breath.   Cardiovascular: Negative for chest pain and palpitations.  Psychiatric/Behavioral: Negative for sleep disturbance.       Objective:   Vitals:   11/25/16 1602  BP: 130/80  Pulse: 68  Temp: 97.6 F (36.4 C)  SpO2: 99%   Wt Readings from Last 3 Encounters:  11/25/16 (!) 334 lb (151.5 kg)  10/17/16 (!) 332 lb (150.6 kg)  09/27/16 (!) 337 lb (152.9 kg)   Body mass index is  55.58 kg/m.   Physical Exam  Constitutional: She appears well-developed and well-nourished. No distress.  Skin: Skin is warm and dry. She is not diaphoretic.  Psychiatric: Her behavior is normal. Judgment and thought content normal.  Anxious mood and affect  Assessment & Plan:    See Problem List for Assessment and Plan of chronic medical problems.

## 2016-11-25 ENCOUNTER — Encounter: Payer: Self-pay | Admitting: Internal Medicine

## 2016-11-25 ENCOUNTER — Ambulatory Visit (INDEPENDENT_AMBULATORY_CARE_PROVIDER_SITE_OTHER): Payer: BLUE CROSS/BLUE SHIELD | Admitting: Internal Medicine

## 2016-11-25 VITALS — BP 130/80 | HR 68 | Temp 97.6°F | Ht 65.0 in | Wt 334.0 lb

## 2016-11-25 DIAGNOSIS — G4733 Obstructive sleep apnea (adult) (pediatric): Secondary | ICD-10-CM

## 2016-11-25 DIAGNOSIS — R0789 Other chest pain: Secondary | ICD-10-CM | POA: Diagnosis not present

## 2016-11-25 DIAGNOSIS — E538 Deficiency of other specified B group vitamins: Secondary | ICD-10-CM

## 2016-11-25 DIAGNOSIS — F419 Anxiety disorder, unspecified: Secondary | ICD-10-CM | POA: Diagnosis not present

## 2016-11-25 MED ORDER — CYANOCOBALAMIN 1000 MCG/ML IJ SOLN
1000.0000 ug | Freq: Once | INTRAMUSCULAR | Status: AC
  Administered 2016-11-25: 1000 ug via INTRAMUSCULAR

## 2016-11-25 NOTE — Assessment & Plan Note (Signed)
B12 injection today and monthly

## 2016-11-25 NOTE — Patient Instructions (Signed)
  All other Health Maintenance issues reviewed.   All recommended immunizations and age-appropriate screenings are up-to-date or discussed.  B12 administered today.   Medications reviewed and updated.  No changes recommended at this time.    Please followup in 6 months

## 2016-11-25 NOTE — Assessment & Plan Note (Signed)
Typically very compliant with CPAP, but after having a panic attack when using her CPAP machine she has not been able to resume use. She did try it once and it caused increased anxiety. She wants to resume her CPAP machine, but it is causing her anxiety We will try using it during the day for a short period of time and working her way back up. Her anxiety overall is well controlled and hopefully she will be able to get back to using her CPAP regularly

## 2016-11-25 NOTE — Assessment & Plan Note (Signed)
She had another episode of chest discomfort, which was related to anxiety/panic attack Evaluation in the emergency room was negative and prior full cardiac workup and evaluation with cardiology was negative Discussed controlling anxiety the best that we can with medication and not really No further evaluation is needed

## 2016-11-25 NOTE — Assessment & Plan Note (Signed)
Overall generalized anxiety is controlled, but she does have intermittent increased anxiety at times She wants to continue her current Paxil dose-continue 20 mg daily She does have clonazepam at home that she can use as needed, but she has always been resistant to use in the past. Discussed with her the importance of using it if needed. Discussed that if she uses it only as needed there is very low risk for addiction or dependence Follow-up in 6 months, sooner if needed

## 2016-11-29 ENCOUNTER — Other Ambulatory Visit: Payer: Self-pay | Admitting: Gastroenterology

## 2016-11-29 DIAGNOSIS — S39012A Strain of muscle, fascia and tendon of lower back, initial encounter: Secondary | ICD-10-CM

## 2016-12-29 ENCOUNTER — Ambulatory Visit (INDEPENDENT_AMBULATORY_CARE_PROVIDER_SITE_OTHER): Payer: BLUE CROSS/BLUE SHIELD | Admitting: General Practice

## 2016-12-29 DIAGNOSIS — E538 Deficiency of other specified B group vitamins: Secondary | ICD-10-CM

## 2016-12-29 MED ORDER — CYANOCOBALAMIN 1000 MCG/ML IJ SOLN
1000.0000 ug | Freq: Once | INTRAMUSCULAR | Status: AC
  Administered 2016-12-29: 1000 ug via INTRAMUSCULAR

## 2017-02-03 ENCOUNTER — Ambulatory Visit (INDEPENDENT_AMBULATORY_CARE_PROVIDER_SITE_OTHER): Payer: BLUE CROSS/BLUE SHIELD

## 2017-02-03 ENCOUNTER — Other Ambulatory Visit: Payer: Self-pay | Admitting: Internal Medicine

## 2017-02-03 DIAGNOSIS — E538 Deficiency of other specified B group vitamins: Secondary | ICD-10-CM

## 2017-02-03 MED ORDER — CYANOCOBALAMIN 1000 MCG/ML IJ SOLN
1000.0000 ug | Freq: Once | INTRAMUSCULAR | Status: AC
  Administered 2017-02-03: 1000 ug via INTRAMUSCULAR

## 2017-02-04 ENCOUNTER — Other Ambulatory Visit: Payer: Self-pay | Admitting: Internal Medicine

## 2017-02-04 NOTE — Telephone Encounter (Signed)
[place] Controlled Substance Database checked. Last filled on 09/19/16  No longer on med list, please advise

## 2017-02-05 NOTE — Progress Notes (Signed)
Injection given.   Binnie Rail, MD

## 2017-02-07 ENCOUNTER — Other Ambulatory Visit: Payer: Self-pay | Admitting: Internal Medicine

## 2017-02-07 DIAGNOSIS — B001 Herpesviral vesicular dermatitis: Secondary | ICD-10-CM

## 2017-02-08 NOTE — Telephone Encounter (Signed)
[place] Controlled Substance Database checked. Last filled on 10/29/16

## 2017-02-08 NOTE — Telephone Encounter (Signed)
RX faxed to [facility] 

## 2017-02-09 MED ORDER — VALACYCLOVIR HCL 1 G PO TABS: ORAL_TABLET | ORAL | 1 refills | Status: DC

## 2017-02-09 NOTE — Addendum Note (Signed)
Addended by: Terence Lux B on: 02/09/2017 03:12 PM   Modules accepted: Orders

## 2017-03-10 ENCOUNTER — Other Ambulatory Visit: Payer: Self-pay | Admitting: Internal Medicine

## 2017-03-10 ENCOUNTER — Ambulatory Visit (INDEPENDENT_AMBULATORY_CARE_PROVIDER_SITE_OTHER): Payer: Self-pay

## 2017-03-10 DIAGNOSIS — E538 Deficiency of other specified B group vitamins: Secondary | ICD-10-CM

## 2017-03-10 MED ORDER — CYANOCOBALAMIN 1000 MCG/ML IJ SOLN
1000.0000 ug | Freq: Once | INTRAMUSCULAR | Status: AC
  Administered 2017-03-10: 1000 ug via INTRAMUSCULAR

## 2017-03-10 NOTE — Progress Notes (Signed)
b12 Injection given.   Binnie Rail, MD

## 2017-04-05 ENCOUNTER — Telehealth: Payer: Self-pay | Admitting: *Deleted

## 2017-04-05 DIAGNOSIS — S39012A Strain of muscle, fascia and tendon of lower back, initial encounter: Secondary | ICD-10-CM

## 2017-04-05 MED ORDER — PANTOPRAZOLE SODIUM 40 MG PO TBEC: 40.0000 mg | DELAYED_RELEASE_TABLET | Freq: Every day | ORAL | 3 refills | Status: DC

## 2017-04-05 NOTE — Telephone Encounter (Signed)
Refilled protonix per 90 day fax request from pharmacy

## 2017-04-21 ENCOUNTER — Ambulatory Visit: Payer: Self-pay

## 2017-05-02 ENCOUNTER — Other Ambulatory Visit: Payer: Self-pay | Admitting: Physician Assistant

## 2017-05-05 ENCOUNTER — Ambulatory Visit (INDEPENDENT_AMBULATORY_CARE_PROVIDER_SITE_OTHER): Payer: BLUE CROSS/BLUE SHIELD | Admitting: *Deleted

## 2017-05-05 DIAGNOSIS — E538 Deficiency of other specified B group vitamins: Secondary | ICD-10-CM

## 2017-05-05 MED ORDER — CYANOCOBALAMIN 1000 MCG/ML IJ SOLN
1000.0000 ug | Freq: Once | INTRAMUSCULAR | Status: AC
  Administered 2017-05-05: 1000 ug via INTRAMUSCULAR

## 2017-05-12 ENCOUNTER — Ambulatory Visit: Payer: BLUE CROSS/BLUE SHIELD | Admitting: Cardiovascular Disease

## 2017-05-12 ENCOUNTER — Encounter: Payer: Self-pay | Admitting: Cardiovascular Disease

## 2017-05-12 ENCOUNTER — Ambulatory Visit: Payer: Self-pay | Admitting: Pulmonary Disease

## 2017-05-12 VITALS — BP 154/100 | HR 72 | Ht 65.0 in | Wt 349.0 lb

## 2017-05-12 DIAGNOSIS — Z9289 Personal history of other medical treatment: Secondary | ICD-10-CM

## 2017-05-12 DIAGNOSIS — G4730 Sleep apnea, unspecified: Secondary | ICD-10-CM

## 2017-05-12 DIAGNOSIS — R072 Precordial pain: Secondary | ICD-10-CM

## 2017-05-12 DIAGNOSIS — I471 Supraventricular tachycardia: Secondary | ICD-10-CM

## 2017-05-12 DIAGNOSIS — I10 Essential (primary) hypertension: Secondary | ICD-10-CM

## 2017-05-12 MED ORDER — LISINOPRIL 5 MG PO TABS: 5.0000 mg | ORAL_TABLET | Freq: Every day | ORAL | 6 refills | Status: DC

## 2017-05-12 NOTE — Patient Instructions (Signed)
Medication Instructions:   Begin Lisinopril 65m daily.  Continue all other medications.    Labwork:  BMET - order given today.  Due in approximately 3-5 days.   Office will contact with results via phone or letter.    Testing/Procedures: none  Follow-Up: Your physician wants you to follow up in:  1 year.  You will receive a reminder letter in the mail one-two months in advance.  If you don't receive a letter, please call our office to schedule the follow up appointment   Any Other Special Instructions Will Be Listed Below (If Applicable).  If you need a refill on your cardiac medications before your next appointment, please call your pharmacy.

## 2017-05-12 NOTE — Progress Notes (Signed)
SUBJECTIVE: The patient presents for follow-up of paroxysmal supraventricular tachycardia.  She also has a history of noncardiac chest pain, morbid obesity, severe sleep apnea, and hypertension.  She was evaluated in the ED at [facility] on 11/21/16 for chest pain and left arm numbness.  I reviewed all relevant documentation, labs, and studies.  Blood pressure was 139/64.  Troponins and chest x-ray were normal.  It was deemed noncardiac in etiology.  She had been off of her Paxil at that time.  She apparently suffered from anxiety and depression with panic attacks.  She was restarted on Paxil and has been doing better.  However, she became very tearful when telling me about her best friend's death last year.  She was a mother of 3 and she was 6 months younger than the patient.  Blood pressures at home have ranged from 140s/80-90 range.  She has sleep apnea and uses CPAP and is scheduled to see pulmonary on 06/05/17.  She still wants to lose weight and wants to start exercising.  She said she has a lot of stress in her life.  Her son recently got engaged and is a Engineer, structural.    Review of Systems: As per "subjective", otherwise negative.  No Known Allergies  Current Outpatient Medications  Medication Sig Dispense Refill  . Cholecalciferol (VITAMIN D) 2000 units tablet Take 3,000 Units by mouth daily.     . metoprolol tartrate (LOPRESSOR) 25 MG tablet Take 25 mg by mouth daily.    . ondansetron (ZOFRAN) 4 MG tablet Take 1 tablet (4 mg total) by mouth every 6 (six) hours as needed for nausea. 12 tablet 0  . pantoprazole (PROTONIX) 40 MG tablet Take 40 mg by mouth daily.    [name] PARoxetine (PAXIL) 20 MG tablet TAKE 1 TABLET (20 MG TOTAL) BY MOUTH DAILY. 90 tablet 0  . valACYclovir (VALTREX) 1000 MG tablet TAKE 2 TABLETS EVERY 12 HOURS FOR 1 DAY START ASAP AFTER SYTPTOM ONSET 18 tablet 1   Current Facility-Administered Medications  Medication Dose Route Frequency Provider  Last Rate Last Dose  . cyanocobalamin ((VITAMIN B-12)) injection 1,000 mcg  1,000 mcg Intramuscular Q30 days Binnie Rail, MD        Past Medical History:  Diagnosis Date  . Anxiety   . ASTHMA   . DELAYED GASTRIC EMPTYING   . DEPRESSION   . GERD   . OBESITY   . OBSTRUCTIVE SLEEP APNEA   . POLYCYSTIC OVARIAN DISEASE   . SMOKER   . SVT (supraventricular tachycardia) ([place])   . Tachycardia   . Umbilical hernia   . Umbilical hernia   . Vitamin B12 deficiency 03/2015 dx   start IM replacement q 30d  . Vitamin D deficiency 03/2015 dx    Past Surgical History:  Procedure Laterality Date  . CESAREAN SECTION  97 & 98   x's 2  . Cocxyl removal  1995  . DILATION AND CURETTAGE OF UTERUS    . LAPAROSCOPY    . TUBAL LIGATION  1998    Social History   Socioeconomic History  . Marital status: Married    Spouse name: Not on file  . Number of children: 2  . Years of education: Not on file  . Highest education level: Not on file  Social Needs  . Financial resource strain: Not on file  . Food insecurity - worry: Not on file  . Food insecurity - inability: Not on file  . Transportation  needs - medical: Not on file  . Transportation needs - non-medical: Not on file  Occupational History  . 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 Smoking status: Current Some Day Smoker    Packs/day: 0.25    Types: Cigarettes    Start date: 10/23/1987  . Smokeless tobacco: Never Used  Substance and Sexual Activity  . Alcohol use: Yes    Alcohol/week: 0.0 oz    Comment: Rarely  . Drug use: No  . Sexual activity: Not on file  Other Topics Concern  . Not on file  Social History Narrative   Lives with husband & 2 kids from 78st marriage. (1st husband expired 1998-leukemia) work as Merchandiser, retail, but currently unemployed     Vitals:   05/12/17 1512  BP: (!) 154/100  Pulse: 72  SpO2: 98%  Weight: (!) 349 lb (158.3 kg)  Height: 5' 5"   (1.651 m)    Wt Readings from Last 3 Encounters:  05/12/17 (!) 349 lb (158.3 kg)  11/25/16 (!) 334 lb (151.5 kg)  10/17/16 (!) 332 lb (150.6 kg)     PHYSICAL EXAM General: NAD, anxious. HEENT: Normal. Neck: No JVD, no thyromegaly. Lungs: Clear to auscultation bilaterally with normal respiratory effort. CV: Regular rate and rhythm, normal S1/S2, no S3/S4, no murmur. No pretibial or periankle edema.  No carotid bruit.   Abdomen: Soft, nontender, obese.  Neurologic: Alert and oriented.  Psych: Full. Skin: Normal. Musculoskeletal: No gross deformities.    ECG: Most recent ECG reviewed.   Labs: Lab Results  Component Value Date/Time   K 3.9 09/23/2016 03:01 AM   BUN 12 09/23/2016 03:01 AM   BUN 10 02/18/2015   CREATININE 0.92 09/23/2016 03:01 AM   ALT 16 09/23/2016 03:01 AM   TSH 2.53 05/25/2016 11:21 AM   HGB 11.9 (L) 09/23/2016 03:01 AM     Lipids: Lab Results  Component Value Date/Time   LDLCALC 133 (H) 05/25/2016 11:21 AM   LDLDIRECT 154.1 08/16/2012 08:28 AM   CHOL 200 05/25/2016 11:21 AM   TRIG 136.0 05/25/2016 11:21 AM   HDL 38.90 (L) 05/25/2016 11:21 AM       ASSESSMENT AND PLAN: 1. Paroxysmal supraventricular tachycardia: No recurrences. Continue metoprolol tartrate.  She had been taking 25 mg every morning and I encouraged her to take 12.5 mg twice daily.  2. Chest pain: Normal nuclear stress test on 04/27/16. No indication for further cardiovascular testing.  Previous symptoms were likely due to anxiety and depression with panic attacks.  3. Morbid obesity: I encouraged weight loss with exercise and dietary modification.   4. Severe sleep apnea: She is on CPAP and scheduled to see pulmonary in the near future.  5.  Hypertension: Blood pressure is elevated and appears to be consistently elevated at home.  I will start lisinopril 5 mg daily and check a basic metabolic panel within 5 days of initiation.  I asked her to begin checking her blood pressure  twice per week.    Disposition: Follow up 1 year   Kate Sable, M.D., F.A.C.C.

## 2017-05-17 ENCOUNTER — Telehealth: Payer: Self-pay | Admitting: *Deleted

## 2017-05-17 NOTE — Telephone Encounter (Signed)
Just seen in office on 05/12/2017 with Dr. Bronson Ing.  Changed her Lopressor to 12.30m twice a day & added Lisinopril 591mdaily.  Since making the change on the Lopressor she is having bad headaches for about the last 3 days.  Takes her Lisinopril 69m6mt around 8:00 am & her Lopressor at 7:00 am & 7:00 pm.    2/4 - 178/102  98 2/5 - 194/115  124 2/6 - 168/98  126  Stated that she felt like she did a lot better on the Lopressor 269m9mily.  Stated that she has been stressed with her son becoming a poliEngineer, structuralut stated that she is trying to be better with that.    Message fwd to provider for further advice.

## 2017-05-17 NOTE — Telephone Encounter (Signed)
Let's start by increasing lisinopril to 20 mg daily.

## 2017-05-18 ENCOUNTER — Encounter: Payer: Self-pay | Admitting: Internal Medicine

## 2017-05-18 DIAGNOSIS — R7303 Prediabetes: Secondary | ICD-10-CM | POA: Insufficient documentation

## 2017-05-18 MED ORDER — LISINOPRIL 20 MG PO TABS: 20.0000 mg | ORAL_TABLET | Freq: Every day | ORAL | 0 refills | Status: DC

## 2017-05-18 NOTE — Telephone Encounter (Signed)
Rx sent. Patient notified.

## 2017-05-18 NOTE — Patient Instructions (Addendum)
Test(s) ordered today. Your results will be released to [facility] (or called to you) after review, usually within 72hours after test completion. If any changes need to be made, you will be notified at that same time.  All other Health Maintenance issues reviewed.   All recommended immunizations and age-appropriate screenings are up-to-date or discussed.  tetanusimmunization administered today.   Medications reviewed and updated.   No changes recommended at this time.  Your prescription(s) have been submitted to your pharmacy. Please take as directed and contact our office if you believe you are having problem(s) with the medication(s).  Please followup in 3 months   Health Maintenance, Female Adopting a healthy lifestyle and getting preventive care can go a long way to promote health and wellness. Talk with your health care provider about what schedule of regular examinations is right for you. This is a good chance for you to check in with your provider about disease prevention and staying healthy. In between checkups, there are plenty of things you can do on your own. Experts have done a lot of research about which lifestyle changes and preventive measures are most likely to keep you healthy. Ask your health care provider for more information. Weight and diet Eat a healthy diet  Be sure to include plenty of vegetables, fruits, low-fat dairy products, and lean protein.  Do not eat a lot of foods high in solid fats, added sugars, or salt.  Get regular exercise. This is one of the most important things you can do for your health. ? Most adults should exercise for at least 150 minutes each week. The exercise should increase your heart rate and make you sweat (moderate-intensity exercise). ? Most adults should also do strengthening exercises at least twice a week. This is in addition to the moderate-intensity exercise.  Maintain a healthy weight  Body mass index (BMI) is a measurement that  can be used to identify possible weight problems. It estimates body fat based on height and weight. Your health care provider can help determine your BMI and help you achieve or maintain a healthy weight.  For females 74 years of age and older: ? A BMI below 18.5 is considered underweight. ? A BMI of 18.5 to 24.9 is normal. ? A BMI of 25 to 29.9 is considered overweight. ? A BMI of 30 and above is considered obese.  Watch levels of cholesterol and blood lipids  You should start having your blood tested for lipids and cholesterol at 46 years of age, then have this test every 5 years.  You may need to have your cholesterol levels checked more often if: ? Your lipid or cholesterol levels are high. ? You are older than 46 years of age. ? You are at high risk for heart disease.  Cancer screening Lung Cancer  Lung cancer screening is recommended for adults 36-57 years old who are at high risk for lung cancer because of a history of smoking.  A yearly low-dose CT scan of the lungs is recommended for people who: ? Currently smoke. ? Have quit within the past 15 years. ? Have at least a 30-pack-year history of smoking. A pack year is smoking an average of one pack of cigarettes a day for 1 year.  Yearly screening should continue until it has been 15 years since you quit.  Yearly screening should stop if you develop a health problem that would prevent you from having lung cancer treatment.  Breast Cancer  Practice breast self-awareness. This  self-awareness. This means understanding how your breasts normally appear and feel.  It also means doing regular breast self-exams. Let your health care provider know about any changes, no matter how small.  If you are in your 20s or 30s, you should have a clinical breast exam (CBE) by a health care provider every 1-3 years as part of a regular health exam.  If you are 40 or older, have a CBE every year. Also consider having a breast X-ray (mammogram) every  year.  If you have a family history of breast cancer, talk to your health care provider about genetic screening.  If you are at high risk for breast cancer, talk to your health care provider about having an MRI and a mammogram every year.  Breast cancer gene (BRCA) assessment is recommended for women who have family members with BRCA-related cancers. BRCA-related cancers include: ? Breast. ? Ovarian. ? Tubal. ? Peritoneal cancers.  Results of the assessment will determine the need for genetic counseling and BRCA1 and BRCA2 testing.  Cervical Cancer Your health care provider may recommend that you be screened regularly for cancer of the pelvic organs (ovaries, uterus, and vagina). This screening involves a pelvic examination, including checking for microscopic changes to the surface of your cervix (Pap test). You may be encouraged to have this screening done every 3 years, beginning at age 21.  For women ages 30-65, health care providers may recommend pelvic exams and Pap testing every 3 years, or they may recommend the Pap and pelvic exam, combined with testing for human papilloma virus (HPV), every 5 years. Some types of HPV increase your risk of cervical cancer. Testing for HPV may also be done on women of any age with unclear Pap test results.  Other health care providers may not recommend any screening for nonpregnant women who are considered low risk for pelvic cancer and who do not have symptoms. Ask your health care provider if a screening pelvic exam is right for you.  If you have had past treatment for cervical cancer or a condition that could lead to cancer, you need Pap tests and screening for cancer for at least 20 years after your treatment. If Pap tests have been discontinued, your risk factors (such as having a new sexual partner) need to be reassessed to determine if screening should resume. Some women have medical problems that increase the chance of getting cervical cancer. In  these cases, your health care provider may recommend more frequent screening and Pap tests.  Colorectal Cancer  This type of cancer can be detected and often prevented.  Routine colorectal cancer screening usually begins at 46 years of age and continues through 46 years of age.  Your health care provider may recommend screening at an earlier age if you have risk factors for colon cancer.  Your health care provider may also recommend using home test kits to check for hidden blood in the stool.  A small camera at the end of a tube can be used to examine your colon directly (sigmoidoscopy or colonoscopy). This is done to check for the earliest forms of colorectal cancer.  Routine screening usually begins at age 50.  Direct examination of the colon should be repeated every 5-10 years through 46 years of age. However, you may need to be screened more often if early forms of precancerous polyps or small growths are found.  Skin Cancer  Check your skin from head to toe regularly.  Tell your health care provider about   moles or changes in moles, especially if there is a change in a mole's shape or color.  Also tell your health care provider if you have a mole that is larger than the size of a pencil eraser.  Always use sunscreen. Apply sunscreen liberally and repeatedly throughout the day.  Protect yourself by wearing long sleeves, pants, a wide-brimmed hat, and sunglasses whenever you are outside.  Heart disease, diabetes, and high blood pressure  High blood pressure causes heart disease and increases the risk of stroke. High blood pressure is more likely to develop in: ? People who have blood pressure in the high end of the normal range (130-139/85-89 mm Hg). ? People who are overweight or obese. ? People who are African American.  If you are 18-39 years of age, have your blood pressure checked every 3-5 years. If you are 40 years of age or older, have your blood pressure checked every year.  You should have your blood pressure measured twice-once when you are at a hospital or clinic, and once when you are not at a hospital or clinic. Record the average of the two measurements. To check your blood pressure when you are not at a hospital or clinic, you can use: ? An automated blood pressure machine at a pharmacy. ? A home blood pressure monitor.  If you are between 55 years and 79 years old, ask your health care provider if you should take aspirin to prevent strokes.  Have regular diabetes screenings. This involves taking a blood sample to check your fasting blood sugar level. ? If you are at a normal weight and have a low risk for diabetes, have this test once every three years after 45 years of age. ? If you are overweight and have a high risk for diabetes, consider being tested at a younger age or more often. Preventing infection Hepatitis B  If you have a higher risk for hepatitis B, you should be screened for this virus. You are considered at high risk for hepatitis B if: ? You were born in a country where hepatitis B is common. Ask your health care provider which countries are considered high risk. ? Your parents were born in a high-risk country, and you have not been immunized against hepatitis B (hepatitis B vaccine). ? You have HIV or AIDS. ? You use needles to inject street drugs. ? You live with someone who has hepatitis B. ? You have had sex with someone who has hepatitis B. ? You get hemodialysis treatment. ? You take certain medicines for conditions, including cancer, organ transplantation, and autoimmune conditions.  Hepatitis C  Blood testing is recommended for: ? Everyone born from 1945 through 1965. ? Anyone with known risk factors for hepatitis C.  Sexually transmitted infections (STIs)  You should be screened for sexually transmitted infections (STIs) including gonorrhea and chlamydia if: ? You are sexually active and are younger than 46 years of  age. ? You are older than 46 years of age and your health care provider tells you that you are at risk for this type of infection. ? Your sexual activity has changed since you were last screened and you are at an increased risk for chlamydia or gonorrhea. Ask your health care provider if you are at risk.  If you do not have HIV, but are at risk, it may be recommended that you take a prescription medicine daily to prevent HIV infection. This is called pre-exposure prophylaxis (PrEP). You are considered at risk   if: ? You are sexually active and do not regularly use condoms or know the HIV status of your partner(s). ? You take drugs by injection. ? You are sexually active with a partner who has HIV.  Talk with your health care provider about whether you are at high risk of being infected with HIV. If you choose to begin PrEP, you should first be tested for HIV. You should then be tested every 3 months for as long as you are taking PrEP. Pregnancy  If you are premenopausal and you may become pregnant, ask your health care provider about preconception counseling.  If you may become pregnant, take 400 to 800 micrograms (mcg) of folic acid every day.  If you want to prevent pregnancy, talk to your health care provider about birth control (contraception). Osteoporosis and menopause  Osteoporosis is a disease in which the bones lose minerals and strength with aging. This can result in serious bone fractures. Your risk for osteoporosis can be identified using a bone density scan.  If you are 75 years of age or older, or if you are at risk for osteoporosis and fractures, ask your health care provider if you should be screened.  Ask your health care provider whether you should take a calcium or vitamin D supplement to lower your risk for osteoporosis.  Menopause may have certain physical symptoms and risks.  Hormone replacement therapy may reduce some of these symptoms and risks. Talk to your health  care provider about whether hormone replacement therapy is right for you. Follow these instructions at home:  Schedule regular health, dental, and eye exams.  Stay current with your immunizations.  Do not use any tobacco products including cigarettes, chewing tobacco, or electronic cigarettes.  If you are pregnant, do not drink alcohol.  If you are breastfeeding, limit how much and how often you drink alcohol.  Limit alcohol intake to no more than 1 drink per day for nonpregnant women. One drink equals 12 ounces of beer, 5 ounces of wine, or 1 ounces of hard liquor.  Do not use street drugs.  Do not share needles.  Ask your health care provider for help if you need support or information about quitting drugs.  Tell your health care provider if you often feel depressed.  Tell your health care provider if you have ever been abused or do not feel safe at home. This information is not intended to replace advice given to you by your health care provider. Make sure you discuss any questions you have with your health care provider. Document Released: 10/11/2010 Document Revised: 09/03/2015 Document Reviewed: 12/30/2014 Elsevier Interactive Patient Education  [employer].

## 2017-05-18 NOTE — Progress Notes (Signed)
Subjective:    Patient ID: [name], female    DOB: [birth year], 47 y.o.   MRN: [id number]  HPI She is here for a physical exam.   She has stopped eating fast food and soda.  She has lost weight.  She still thinks about a gastric sleeve.  At this point she wants to try to lose more weight and then would consider undergoing the surgery.  Anxiety:  She is taking her paxil daily.  Last year she was in the ED three times for panic attacks.  She has occasional panic attacks and has taken xanax.  She would like to get another prescription for that to have on hand only as needed.  She does not like taking the medication.  Medications and allergies reviewed with patient and updated if appropriate.  Patient Active Problem List   Diagnosis Date Noted  . Prediabetes 05/18/2017  . SOB (shortness of breath) 09/24/2016  . Palpitations 09/24/2016  . Chest pressure 09/24/2016  . Family history of early CAD 03/23/2016  . SVT (supraventricular tachycardia) ([place]) 03/14/2016  . Adrenal nodule ([place]) 01/06/2016  . Nausea without vomiting 12/02/2015  . 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 Umbilical hernia 56/43/3295  . Morbid obesity ([place]) 04/09/2015  . Vitamin D deficiency   . Vitamin B12 deficiency   . Anxiety   . Tobacco abuse 01/28/2009  . Depression 01/28/2009  . GERD 01/28/2009  . POLYCYSTIC OVARIAN DISEASE 02/10/2007  . Obstructive sleep apnea 02/10/2007  . Asthma 02/10/2007    Current Outpatient Medications on File Prior to Visit  Medication Sig Dispense Refill  . Cholecalciferol (VITAMIN D) 2000 units tablet Take 3,000 Units by mouth daily.     [name] lisinopril (PRINIVIL,ZESTRIL) 20 MG tablet Take 1 tablet (20 mg total) by mouth daily. (Patient taking differently: Take 5 mg by mouth daily. ) 90 tablet 0  . metoprolol tartrate (LOPRESSOR) 25 MG tablet Take 25 mg by mouth daily.    . ondansetron (ZOFRAN) 4 MG tablet Take 1 tablet (4 mg total) by mouth every 6 (six) hours as needed  for nausea. 12 tablet 0  . pantoprazole (PROTONIX) 40 MG tablet Take 40 mg by mouth daily.    [name] PARoxetine (PAXIL) 20 MG tablet TAKE 1 TABLET (20 MG TOTAL) BY MOUTH DAILY. 90 tablet 0  . valACYclovir (VALTREX) 1000 MG tablet TAKE 2 TABLETS EVERY 12 HOURS FOR 1 DAY START ASAP AFTER SYTPTOM ONSET 18 tablet 1   Current Facility-Administered Medications on File Prior to Visit  Medication Dose Route Frequency Provider Last Rate Last Dose  . cyanocobalamin ((VITAMIN B-12)) injection 1,000 mcg  1,000 mcg Intramuscular Q30 days Binnie Rail, MD        Past Medical History:  Diagnosis Date  . Anxiety   . ASTHMA   . DELAYED GASTRIC EMPTYING   . DEPRESSION   . GERD   . Glucose intolerance (impaired glucose tolerance) 04/09/2015  . OBESITY   . OBSTRUCTIVE SLEEP APNEA   . POLYCYSTIC OVARIAN DISEASE   . SMOKER   . SVT (supraventricular tachycardia) ([place])   . Tachycardia   . Umbilical hernia   . Umbilical hernia   . Vitamin B12 deficiency 03/2015 dx   start IM replacement q 30d  . Vitamin D deficiency 03/2015 dx    Past Surgical History:  Procedure Laterality Date  . CESAREAN SECTION  97 & 98   x's 2  . Cocxyl removal  1995  . DILATION  AND CURETTAGE OF UTERUS    . LAPAROSCOPY    . TUBAL LIGATION  1998    Social History   Socioeconomic History  . Marital status: Married    Spouse name: None  . Number of children: 2  . Years of education: None  . Highest education level: None  Social Needs  . Financial resource strain: None  . Food insecurity - worry: None  . Food insecurity - inability: None  . Transportation needs - medical: None  . Transportation needs - non-medical: None  Occupational History  . 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 Smoking status: Current Some Day Smoker    Packs/day: 0.25    Types: Cigarettes    Start date: 10/23/1987  . Smokeless tobacco: Never Used  Substance and Sexual Activity  . Alcohol use: Yes     Alcohol/week: 0.0 oz    Comment: Rarely  . Drug use: No  . Sexual activity: None  Other Topics Concern  . None  Social History Narrative   Lives with husband & 2 kids from 1st marriage. (1st husband expired 1998-leukemia) work as Merchandiser, retail, but currently unemployed    Family History  Problem Relation Age of Onset  . Breast cancer Mother        dx 11/2009  . Diabetes Maternal Aunt   . Prostate cancer Maternal Grandfather 42       also hx colon polyps  . Heart disease Paternal Grandmother   . Pancreatic cancer Paternal Grandmother 76  . Heart murmur Sister   . Adrenal disorder Neg Hx     Review of Systems  Constitutional: Negative for chills and fever.  Eyes: Negative for visual disturbance.  Respiratory: Positive for shortness of breath (from obesity). Negative for cough and wheezing.   Cardiovascular: Positive for chest pain (one episode -discussed with cardio) and palpitations (occ). Negative for leg swelling.  Gastrointestinal: Negative for abdominal pain, blood in stool, constipation, diarrhea and nausea.       GERD controlled  Genitourinary: Negative for dysuria and hematuria.  Musculoskeletal: Positive for arthralgias (knee pain). Negative for back pain.  Skin: Negative for color change and rash.  Neurological: Positive for headaches (frequent  - tylenol). Negative for light-headedness and numbness (occ tingling in left hand).  Psychiatric/Behavioral: Negative for dysphoric mood. The patient is nervous/anxious.        Objective:   Vitals:   05/19/17 0920  BP: 118/80  Pulse: 71  Resp: 20  Temp: 97.9 F (36.6 C)  SpO2: 98%   Filed Weights   05/19/17 0920  Weight: (!) 346 lb (156.9 kg)   Body mass index is 57.58 kg/m.  Wt Readings from Last 3 Encounters:  05/19/17 (!) 346 lb (156.9 kg)  05/12/17 (!) 349 lb (158.3 kg)  11/25/16 (!) 334 lb (151.5 kg)     Physical Exam Constitutional: She appears well-developed and well-nourished. No  distress.  HENT:  Head: Normocephalic and atraumatic.  Right Ear: External ear normal. Normal ear canal and TM Left Ear: External ear normal.  Normal ear canal and TM Mouth/Throat: Oropharynx is clear and moist.  Eyes: Conjunctivae and EOM are normal.  Neck: Neck supple. No tracheal deviation present. No thyromegaly present.  No carotid bruit  Cardiovascular: Normal rate, regular rhythm and normal heart sounds.   No murmur heard.  No edema. Pulmonary/Chest: Effort normal and breath sounds normal. No respiratory distress. She  has no wheezes. She has no rales.  Breast: deferred to Gyn Abdominal: Soft. Umbilical/ventral hernia - minimally tender - not incarcerated. She exhibits no distension. There is no tenderness.  Lymphadenopathy: She has no cervical adenopathy.  Skin: Skin is warm and dry. She is not diaphoretic.  Psychiatric: She has a normal mood and affect. Her behavior is normal.    Screened for depression using the PHQ 2 scale, score 0/2.  No evidence of depression.       Assessment & Plan:   Physical exam: Screening blood work  ordered Immunizations  tdap today, others up to date Mammogram  Due - will schedule Gyn   Up to date  Eye exams   Up to date  Exercise    Doing 10 min on treadmill - encouraged her to increase Weight   Has lost weight and is determined to lose weight Skin-no concerns Substance abuse she smokes 4 cigs/day -- working on decreasing  See Problem List for Assessment and Plan of chronic medical problems.   Follow-up in 3 months, sooner if needed

## 2017-05-19 ENCOUNTER — Encounter: Payer: Self-pay | Admitting: Internal Medicine

## 2017-05-19 ENCOUNTER — Other Ambulatory Visit (INDEPENDENT_AMBULATORY_CARE_PROVIDER_SITE_OTHER): Payer: BLUE CROSS/BLUE SHIELD

## 2017-05-19 ENCOUNTER — Ambulatory Visit (INDEPENDENT_AMBULATORY_CARE_PROVIDER_SITE_OTHER): Payer: BLUE CROSS/BLUE SHIELD | Admitting: Internal Medicine

## 2017-05-19 VITALS — BP 118/80 | HR 71 | Temp 97.9°F | Resp 20 | Ht 65.0 in | Wt 346.0 lb

## 2017-05-19 DIAGNOSIS — K429 Umbilical hernia without obstruction or gangrene: Secondary | ICD-10-CM | POA: Diagnosis not present

## 2017-05-19 DIAGNOSIS — G4733 Obstructive sleep apnea (adult) (pediatric): Secondary | ICD-10-CM

## 2017-05-19 DIAGNOSIS — K219 Gastro-esophageal reflux disease without esophagitis: Secondary | ICD-10-CM

## 2017-05-19 DIAGNOSIS — E559 Vitamin D deficiency, unspecified: Secondary | ICD-10-CM

## 2017-05-19 DIAGNOSIS — Z720 Tobacco use: Secondary | ICD-10-CM

## 2017-05-19 DIAGNOSIS — Z1331 Encounter for screening for depression: Secondary | ICD-10-CM | POA: Diagnosis not present

## 2017-05-19 DIAGNOSIS — F419 Anxiety disorder, unspecified: Secondary | ICD-10-CM | POA: Diagnosis not present

## 2017-05-19 DIAGNOSIS — Z0000 Encounter for general adult medical examination without abnormal findings: Secondary | ICD-10-CM | POA: Diagnosis not present

## 2017-05-19 DIAGNOSIS — R7303 Prediabetes: Secondary | ICD-10-CM

## 2017-05-19 DIAGNOSIS — Z23 Encounter for immunization: Secondary | ICD-10-CM

## 2017-05-19 DIAGNOSIS — E279 Disorder of adrenal gland, unspecified: Secondary | ICD-10-CM | POA: Diagnosis not present

## 2017-05-19 DIAGNOSIS — E538 Deficiency of other specified B group vitamins: Secondary | ICD-10-CM

## 2017-05-19 DIAGNOSIS — E278 Other specified disorders of adrenal gland: Secondary | ICD-10-CM

## 2017-05-19 LAB — COMPREHENSIVE METABOLIC PANEL
ALT: 15 U/L (ref 0–35)
AST: 10 U/L (ref 0–37)
Albumin: 4.2 g/dL (ref 3.5–5.2)
Alkaline Phosphatase: 66 U/L (ref 39–117)
BUN: 17 mg/dL (ref 6–23)
CO2: 27 mEq/L (ref 19–32)
Calcium: 9.4 mg/dL (ref 8.4–10.5)
Chloride: 103 mEq/L (ref 96–112)
Creatinine, Ser: 0.95 mg/dL (ref 0.40–1.20)
GFR: 67.5 mL/min (ref >60.00)
Glucose, Bld: 97 mg/dL (ref 70–99)
Potassium: 4.2 mEq/L (ref 3.5–5.1)
Sodium: 138 mEq/L (ref 135–145)
Total Bilirubin: 0.4 mg/dL (ref 0.2–1.2)
Total Protein: 7.8 g/dL (ref 6.0–8.3)

## 2017-05-19 LAB — LIPID PANEL
Cholesterol: 167 mg/dL (ref 0–200)
HDL: 34.8 mg/dL — ABNORMAL LOW (ref >39.00)
LDL Cholesterol: 110 mg/dL — ABNORMAL HIGH (ref 0–99)
NonHDL: 132.05
Total CHOL/HDL Ratio: 5
Triglycerides: 108 mg/dL (ref 0.0–149.0)
VLDL: 21.6 mg/dL (ref 0.0–40.0)

## 2017-05-19 LAB — CBC WITH DIFFERENTIAL/PLATELET
Basophils Absolute: 31 cells/uL (ref 0–200)
Basophils Relative: 0.4 %
Eosinophils Absolute: 123 cells/uL (ref 15–500)
Eosinophils Relative: 1.6 %
HCT: 36.6 % (ref 35.0–45.0)
Hemoglobin: 12.4 g/dL (ref 11.7–15.5)
Lymphs Abs: 2225 cells/uL (ref 850–3900)
MCH: 29.7 pg (ref 27.0–33.0)
MCHC: 33.9 g/dL (ref 32.0–36.0)
MCV: 87.6 fL (ref 80.0–100.0)
MPV: 11.1 fL (ref 7.5–12.5)
Monocytes Relative: 5.6 %
Neutro Abs: 4890 cells/uL (ref 1500–7800)
Neutrophils Relative %: 63.5 %
Platelets: 386 10*3/uL (ref 140–400)
RBC: 4.18 10*6/uL (ref 3.80–5.10)
RDW: 12.9 % (ref 11.0–15.0)
Total Lymphocyte: 28.9 %
WBC mixed population: 431 cells/uL (ref 200–950)
WBC: 7.7 10*3/uL (ref 3.8–10.8)

## 2017-05-19 LAB — HEMOGLOBIN A1C: Hgb A1c MFr Bld: 5.9 % (ref 4.6–6.5)

## 2017-05-19 LAB — VITAMIN B12: Vitamin B-12: 753 pg/mL (ref 211–911)

## 2017-05-19 LAB — VITAMIN D 25 HYDROXY (VIT D DEFICIENCY, FRACTURES): VITD: 44.3 ng/mL (ref 30.00–100.00)

## 2017-05-19 LAB — TSH: TSH: 1.8 u[IU]/mL (ref 0.35–4.50)

## 2017-05-19 MED ORDER — ALPRAZOLAM 0.5 MG PO TABS: 0.5000 mg | ORAL_TABLET | Freq: Two times a day (BID) | ORAL | 0 refills | Status: DC | PRN

## 2017-05-19 MED ORDER — LISINOPRIL 5 MG PO TABS: 5.0000 mg | ORAL_TABLET | Freq: Every day | ORAL | 3 refills | Status: DC

## 2017-05-19 MED ORDER — PAROXETINE HCL 20 MG PO TABS: 20.0000 mg | ORAL_TABLET | Freq: Every day | ORAL | 1 refills | Status: DC

## 2017-05-19 NOTE — Assessment & Plan Note (Signed)
She does experience intermittent pain and pressure in this area.  There is a lump there when she stands She is considering gastric bypass in the near future and this could be surgically corrected at that time if needed She is working on weight loss and was advised to continue those efforts Discussed signs and symptoms of incarceration

## 2017-05-19 NOTE — Assessment & Plan Note (Signed)
Working on quitting Currently only smoking 4 cigarettes a day and slowly decreasing

## 2017-05-19 NOTE — Assessment & Plan Note (Signed)
Controlled Continue daily Protonix

## 2017-05-19 NOTE — Assessment & Plan Note (Signed)
Will check B12 level Getting monthly injections

## 2017-05-19 NOTE — Assessment & Plan Note (Signed)
Check a1c Low sugar / carb diet Stressed regular exercise, weight loss

## 2017-05-19 NOTE — Assessment & Plan Note (Signed)
Discussed weight loss at length She has changed her diet significantly and has already lost weight Discussed the importance of regular exercise-she has started this and will build slowly

## 2017-05-19 NOTE — Assessment & Plan Note (Signed)
Using CPAP nightly 

## 2017-05-19 NOTE — Assessment & Plan Note (Signed)
Overall anxiety controlled, but has occasional panic attacks Has been in the ED 3 times for panic attacks Will renew xanax to use as needed Continue paxil at current dose

## 2017-05-19 NOTE — Assessment & Plan Note (Signed)
Taking 3000 units daily Will check level

## 2017-05-19 NOTE — Assessment & Plan Note (Signed)
Adenoma-benign No need for further evaluation

## 2017-05-20 ENCOUNTER — Encounter: Payer: Self-pay | Admitting: Internal Medicine

## 2017-05-29 ENCOUNTER — Ambulatory Visit: Payer: Self-pay | Admitting: Internal Medicine

## 2017-06-02 ENCOUNTER — Encounter: Payer: Self-pay | Admitting: Internal Medicine

## 2017-06-05 ENCOUNTER — Encounter: Payer: Self-pay | Admitting: Pulmonary Disease

## 2017-06-05 ENCOUNTER — Ambulatory Visit: Payer: BLUE CROSS/BLUE SHIELD | Admitting: Pulmonary Disease

## 2017-06-05 ENCOUNTER — Ambulatory Visit (INDEPENDENT_AMBULATORY_CARE_PROVIDER_SITE_OTHER): Payer: BLUE CROSS/BLUE SHIELD

## 2017-06-05 VITALS — BP 122/70 | HR 74 | Ht 65.0 in | Wt 346.0 lb

## 2017-06-05 DIAGNOSIS — E538 Deficiency of other specified B group vitamins: Secondary | ICD-10-CM | POA: Diagnosis not present

## 2017-06-05 DIAGNOSIS — Z789 Other specified health status: Secondary | ICD-10-CM

## 2017-06-05 DIAGNOSIS — Z6841 Body Mass Index (BMI) 40.0 and over, adult: Secondary | ICD-10-CM

## 2017-06-05 DIAGNOSIS — Z9989 Dependence on other enabling machines and devices: Secondary | ICD-10-CM | POA: Diagnosis not present

## 2017-06-05 DIAGNOSIS — G4733 Obstructive sleep apnea (adult) (pediatric): Secondary | ICD-10-CM

## 2017-06-05 MED ORDER — CYANOCOBALAMIN 1000 MCG/ML IJ SOLN
1000.0000 ug | Freq: Once | INTRAMUSCULAR | Status: AC
Start: 2017-06-05 — End: 2017-06-05
  Administered 2017-06-05: 1000 ug via INTRAMUSCULAR

## 2017-06-05 NOTE — Patient Instructions (Signed)
Will get your CPAP mask refitted  You can call or email if you are having problems  Follow up in 1 year

## 2017-06-05 NOTE — Progress Notes (Signed)
b12 Injection given.   Binnie Rail, MD

## 2017-06-05 NOTE — Progress Notes (Signed)
[name]  Chief Complaint  Patient presents with  . Follow-up    Pt feels pressure might need readjusted, pt needs new mask and new supplies    Vital signs: BP 122/70 (BP Location: Left Arm, Cuff Size: Normal)   Pulse 74   Ht 5' 5"  (1.651 m)   Wt (!) 346 lb (156.9 kg)   SpO2 97%   BMI 57.58 kg/m   History of Present Illness: [name] is a 47 y.o. female with obstructive sleep apnea.  She had sleep study in 2018.  This showed severe sleep apnea.  She was started on CPAP.  This has helped.  She has nasal mask, but mask shifts.  She wants a different mask.  She was tx with prednisone and Abx recently for bronchitis.  Her cough, and chest congestion are better.  Still feels run down, and sleepier during the day.  She has been working on her weight.  Tried diet pill, but this messed her up.  She also had trouble with anxiety and depression.  She might get referral to bariatric program.   Physical Exam:  General - pleasant Eyes - pupils reactive ENT - no sinus tenderness, no oral exudate, no LAN, triangular uvula Cardiac - regular, no murmur Chest - no wheeze, rales Abd - soft, non tender Ext - no edema Skin - no rashes Neuro - normal strength Psych - normal mood   Assessment/Plan:  Obstructive sleep apnea. - We discussed how sleep apnea can affect various health problems, including risks for hypertension, cardiovascular disease, and diabetes.  We also discussed how sleep disruption can increase risks for accidents, such as while driving.  Weight loss as a means of improving sleep apnea was also reviewed.  Additional treatment options discussed were CPAP therapy, oral appliance, and surgical intervention. - she is compliant with CPAP and reports benefit - she has trouble with nasal mask, and would like to try nasal pillow mask - continue auto CPAP - if her daytime sleepiness persists after she recovers from recent respiratory  infection, then will increase pressure on CPAP  Obesity. - discussed options to assist with weight loss  Recent upper respiratory infection. - clinically improving - don't think she needs additional interventions at this time   Patient Instructions  Will get your CPAP mask refitted  You can call or email if you are having problems  Follow up in 1 year    Chesley Mires, MD [facility] 06/05/2017, 3:58 PM Pager:  [phone]  Flow Sheet  Sleep tests: HST 06/23/16 >> AHI 99.9, SaO2 low 76%. Auto CPAP 05/02/17 to 05/31/17 >> used on 24 of 30 nights with average 8 hrs 46.  Average AHI 0.8 with median CPAP 9 and 95 th percentile CPAP 12 cm H2O  Cardiac tests: Echo 11/07/13 >> EF 55 to 65%  Past Medical History: She  has a past medical history of Anxiety, ASTHMA, DELAYED GASTRIC EMPTYING, DEPRESSION, GERD, Glucose intolerance (impaired glucose tolerance) (04/09/2015), OBESITY, OBSTRUCTIVE SLEEP APNEA, POLYCYSTIC OVARIAN DISEASE, SMOKER, SVT (supraventricular tachycardia) ([place]), Tachycardia, Umbilical hernia, Umbilical hernia, Vitamin B12 deficiency (03/2015 dx), and Vitamin D deficiency (03/2015 dx).  Past Surgical History: She  has a past surgical history that includes Cocxyl removal (1995); Dilation and curettage of uterus; Tubal ligation (1998); Cesarean section (97 & 98); and laparoscopy.  Family History: Her family history includes Breast cancer in her mother; Diabetes in her maternal aunt; Heart disease in her paternal grandmother; Heart  murmur in her sister; Pancreatic cancer (age of onset: 55) in her paternal grandmother; Prostate cancer (age of onset: 57) in her maternal grandfather.  Social History: She  reports that she has been smoking cigarettes.  She started smoking about 29 years ago. She has a 7.50 pack-year smoking history. she has never used smokeless tobacco. She reports that she drinks alcohol. She reports that she does not use  drugs.  Medications: Allergies as of 06/05/2017   No Known Allergies     Medication List        Accurate as of 06/05/17  3:58 PM. Always use your most recent med list.          ALPRAZolam 0.5 MG tablet Commonly known as:  XANAX Take 1 tablet (0.5 mg total) by mouth 3 times/day as needed-between meals & bedtime for anxiety.   lisinopril 5 MG tablet Commonly known as:  PRINIVIL,ZESTRIL Take 1 tablet (5 mg total) by mouth daily.   metoprolol tartrate 25 MG tablet Commonly known as:  LOPRESSOR Take 25 mg by mouth daily.   pantoprazole 40 MG tablet Commonly known as:  PROTONIX Take 40 mg by mouth daily.   PARoxetine 20 MG tablet Commonly known as:  PAXIL Take 1 tablet (20 mg total) by mouth daily.   valACYclovir 1000 MG tablet Commonly known as:  VALTREX TAKE 2 TABLETS EVERY 12 HOURS FOR 1 DAY START ASAP AFTER SYTPTOM ONSET   Vitamin D 2000 units tablet Take 3,000 Units by mouth daily.

## 2017-07-07 ENCOUNTER — Ambulatory Visit (INDEPENDENT_AMBULATORY_CARE_PROVIDER_SITE_OTHER): Payer: BLUE CROSS/BLUE SHIELD | Admitting: Emergency Medicine

## 2017-07-07 DIAGNOSIS — E538 Deficiency of other specified B group vitamins: Secondary | ICD-10-CM

## 2017-07-07 MED ORDER — CYANOCOBALAMIN 1000 MCG/ML IJ SOLN
1000.0000 ug | Freq: Once | INTRAMUSCULAR | Status: AC
  Administered 2017-07-07: 1000 ug via INTRAMUSCULAR

## 2017-07-07 NOTE — Progress Notes (Addendum)
[place] screening examination/treatment/procedure(s) were performed by non-physician practitioner and as supervising physician I was immediately available for consultation/collaboration. I agree with above. Cathlean Cower, MD

## 2017-07-18 ENCOUNTER — Other Ambulatory Visit: Payer: Self-pay | Admitting: Internal Medicine

## 2017-07-18 DIAGNOSIS — B001 Herpesviral vesicular dermatitis: Secondary | ICD-10-CM

## 2017-08-07 ENCOUNTER — Encounter: Payer: Self-pay | Admitting: Internal Medicine

## 2017-08-08 ENCOUNTER — Ambulatory Visit (INDEPENDENT_AMBULATORY_CARE_PROVIDER_SITE_OTHER): Payer: BLUE CROSS/BLUE SHIELD | Admitting: *Deleted

## 2017-08-08 DIAGNOSIS — E538 Deficiency of other specified B group vitamins: Secondary | ICD-10-CM | POA: Diagnosis not present

## 2017-08-08 MED ORDER — CYANOCOBALAMIN 1000 MCG/ML IJ SOLN
1000.0000 ug | Freq: Once | INTRAMUSCULAR | Status: AC
  Administered 2017-08-08: 1000 ug via INTRAMUSCULAR

## 2017-08-18 ENCOUNTER — Ambulatory Visit: Payer: BLUE CROSS/BLUE SHIELD | Admitting: Internal Medicine

## 2017-08-22 ENCOUNTER — Encounter: Payer: Self-pay | Admitting: Internal Medicine

## 2017-08-22 MED ORDER — ALBUTEROL SULFATE HFA 108 (90 BASE) MCG/ACT IN AERS: 2.0000 | INHALATION_SPRAY | RESPIRATORY_TRACT | 0 refills | Status: DC | PRN

## 2017-09-08 ENCOUNTER — Ambulatory Visit (INDEPENDENT_AMBULATORY_CARE_PROVIDER_SITE_OTHER): Payer: BLUE CROSS/BLUE SHIELD

## 2017-09-08 DIAGNOSIS — E538 Deficiency of other specified B group vitamins: Secondary | ICD-10-CM

## 2017-09-08 MED ORDER — CYANOCOBALAMIN 1000 MCG/ML IJ SOLN
1000.0000 ug | Freq: Once | INTRAMUSCULAR | Status: AC
  Administered 2017-09-08: 1000 ug via INTRAMUSCULAR

## 2017-09-08 NOTE — Progress Notes (Signed)
b12 Injection given.   Binnie Rail, MD

## 2017-09-19 ENCOUNTER — Emergency Department (INDEPENDENT_AMBULATORY_CARE_PROVIDER_SITE_OTHER): Payer: BLUE CROSS/BLUE SHIELD

## 2017-09-19 ENCOUNTER — Emergency Department
Admission: EM | Admit: 2017-09-19 | Discharge: 2017-09-19 | Disposition: A | Discharge status: Home / Self Care | Payer: BLUE CROSS/BLUE SHIELD | Source: Home / Self Care | Attending: Family Medicine | Admitting: Family Medicine

## 2017-09-19 ENCOUNTER — Other Ambulatory Visit: Payer: Self-pay

## 2017-09-19 ENCOUNTER — Encounter: Payer: Self-pay | Admitting: Emergency Medicine

## 2017-09-19 DIAGNOSIS — J4521 Mild intermittent asthma with (acute) exacerbation: Secondary | ICD-10-CM

## 2017-09-19 DIAGNOSIS — R0602 Shortness of breath: Secondary | ICD-10-CM

## 2017-09-19 DIAGNOSIS — J3489 Other specified disorders of nose and nasal sinuses: Secondary | ICD-10-CM

## 2017-09-19 DIAGNOSIS — R05 Cough: Secondary | ICD-10-CM | POA: Diagnosis not present

## 2017-09-19 MED ORDER — MUPIROCIN CALCIUM 2 % NA OINT: TOPICAL_OINTMENT | NASAL | 0 refills | Status: DC

## 2017-09-19 MED ORDER — PREDNISONE 20 MG PO TABS: ORAL_TABLET | ORAL | 0 refills | Status: DC

## 2017-09-19 MED ORDER — CETIRIZINE HCL 10 MG PO TABS: 10.0000 mg | ORAL_TABLET | Freq: Every day | ORAL | 1 refills | Status: DC

## 2017-09-19 NOTE — ED Provider Notes (Signed)
[name]    CSN: [id number] Arrival date & time: 09/19/17  1248     History   Chief Complaint Chief Complaint  Patient presents with  . Cough  . Wheezing    HPI KAIRAH LEONI is a 47 y.o. female.   HPI  JARICA PLASS is a 47 y.o. female presenting to UC with hx of asthma c/o 3-4 weeks of intermittent chest tightness, cough, congestion and wheeze.  She has not needed her albuterol inhaler for about 12 years but requested her PCP send in a prescription for her albuterol on 5/14.  She has been using it more frequently over the last 1 week. Denies fever, chills, vomiting or diarrhea. She does have mild nausea but notes that is chronic for her. She has been around someone dx with pneumonia last week. No recent travel.  She has needed steroids in the past for her asthma but none recently. She does not take any allergy medication but has tried OTC Coricidin cough/cold.    Past Medical History:  Diagnosis Date  . Anxiety   . ASTHMA   . DELAYED GASTRIC EMPTYING   . DEPRESSION   . GERD   . Glucose intolerance (impaired glucose tolerance) 04/09/2015  . OBESITY   . OBSTRUCTIVE SLEEP APNEA   . POLYCYSTIC OVARIAN DISEASE   . SMOKER   . SVT (supraventricular tachycardia) ([place])   . Tachycardia   . Umbilical hernia   . Umbilical hernia   . Vitamin B12 deficiency 03/2015 dx   start IM replacement q 30d  . Vitamin D deficiency 03/2015 dx    Patient Active Problem List   Diagnosis Date Noted  . Prediabetes 05/18/2017  . SOB (shortness of breath) 09/24/2016  . Palpitations 09/24/2016  . Family history of early CAD 03/23/2016  . SVT (supraventricular tachycardia) ([place]) 03/14/2016  . Adrenal nodule ([place]) 01/06/2016  . Umbilical hernia 81/15/7262  . Morbid obesity ([place]) 04/09/2015  . Vitamin D deficiency   . Vitamin B12 deficiency   . Anxiety   . Tobacco abuse 01/28/2009  . GERD 01/28/2009  . POLYCYSTIC OVARIAN DISEASE 02/10/2007  . Obstructive sleep apnea 02/10/2007   . Asthma 02/10/2007    Past Surgical History:  Procedure Laterality Date  . CESAREAN SECTION  97 & 98   x's 2  . Cocxyl removal  1995  . DILATION AND CURETTAGE OF UTERUS    . LAPAROSCOPY    . TUBAL LIGATION  1998    OB History    Gravida  3   Para  2   Term  2   Preterm      AB  1   Living        SAB      TAB  1   Ectopic      Multiple      Live Births               Home Medications    Prior to Admission medications   Medication Sig Start Date End Date Taking? Authorizing Provider  albuterol (PROAIR HFA) 108 (90 Base) MCG/ACT inhaler Inhale 2 puffs into the lungs every 4 (four) hours as needed. 08/22/17   Binnie Rail, MD  ALPRAZolam Duanne Moron) 0.5 MG tablet Take 1 tablet (0.5 mg total) by mouth 3 times/day as needed-between meals & bedtime for anxiety. 05/19/17   Binnie Rail, MD  cetirizine (ZYRTEC) 10 MG tablet Take 1 tablet (10 mg total) by mouth daily. 09/19/17  Noe Gens, PA-C  Cholecalciferol (VITAMIN D) 2000 units tablet Take 3,000 Units by mouth daily.     [provider]  lisinopril (PRINIVIL,ZESTRIL) 5 MG tablet Take 1 tablet (5 mg total) by mouth daily. 05/19/17   Binnie Rail, MD  metoprolol tartrate (LOPRESSOR) 25 MG tablet Take 25 mg by mouth daily.    [provider]  mupirocin nasal ointment (BACTROBAN) 2 % Apply to Right nostril 3 times daily for 5 days 09/19/17   Noe Gens, PA-C  pantoprazole (PROTONIX) 40 MG tablet Take 40 mg by mouth daily.    [provider]  PARoxetine (PAXIL) 20 MG tablet Take 1 tablet (20 mg total) by mouth daily. 05/19/17   Binnie Rail, MD  predniSONE (DELTASONE) 20 MG tablet 3 tabs po day one, then 2 po daily x 4 days 09/19/17   Noe Gens, PA-C  valACYclovir (VALTREX) 1000 MG tablet TAKE 2 TABLETS EVERY 12 HOURS FOR 1 DAY START ASAP AFTER SYTPTOM ONSET 02/09/17   Binnie Rail, MD  valACYclovir (VALTREX) 1000 MG tablet TAKE 2 TABLETS EVERY 12 HOURS FOR 1 DAY START ASAP AFTER  SYTPTOM ONSET 07/18/17   Binnie Rail, MD    Family History Family History  Problem Relation Age of Onset  . Breast cancer Mother        dx 11/2009  . Diabetes Maternal Aunt   . Prostate cancer Maternal Grandfather 45       also hx colon polyps  . Heart disease Paternal Grandmother   . Pancreatic cancer Paternal Grandmother 73  . Heart murmur Sister   . Adrenal disorder Neg Hx     Social History Social History   Tobacco Use  . Smoking status: Current Some Day Smoker    Packs/day: 0.25    Years: 30.00    Pack years: 7.50    Types: Cigarettes    Start date: 10/23/1987  . Smokeless tobacco: Never Used  Substance Use Topics  . Alcohol use: Yes    Alcohol/week: 0.0 oz    Comment: Rarely  . Drug use: No     Allergies   Patient has no known allergies.   Review of Systems Review of Systems  Constitutional: Negative for chills and fever.  HENT: Positive for congestion. Negative for sore throat.   Respiratory: Positive for cough, chest tightness, shortness of breath and wheezing.   Cardiovascular: Negative for chest pain and palpitations.  Gastrointestinal: Negative for diarrhea, nausea and vomiting.  Skin: Positive for color change (sore on Right side of nose).  Neurological: Negative for dizziness, light-headedness and headaches.     Physical Exam Triage Vital Signs ED Triage Vitals  Enc Vitals Group     BP 09/19/17 1312 (!) 143/84     Pulse Rate 09/19/17 1312 74     Resp 09/19/17 1312 18     Temp 09/19/17 1312 (!) 97.5 F (36.4 C)     Temp Source 09/19/17 1312 Oral     SpO2 09/19/17 1312 97 %     Weight 09/19/17 1313 (!) 335 lb (152 kg)     Height 09/19/17 1313 5' 5"  (1.651 m)     Head Circumference --      Peak Flow --      Pain Score 09/19/17 1313 0     Pain Loc --      Pain Edu? --      Excl. in [place]? --    No data found.  Updated Vital Signs BP (!) 143/84 (BP Location: Right Arm)   Pulse 74   Temp (!) 97.5 F (36.4 C) (Oral)   Resp 18   Ht 5'  5" (1.651 m)   Wt (!) 335 lb (152 kg)   LMP 09/05/2017 (Exact Date)   SpO2 97%   BMI 55.75 kg/m   Visual Acuity Right Eye Distance:   Left Eye Distance:   Bilateral Distance:    Right Eye Near:   Left Eye Near:    Bilateral Near:     Physical Exam  Constitutional: She is oriented to person, place, and time. She appears well-developed and well-nourished. No distress.  HENT:  Head: Normocephalic and atraumatic.  Right Ear: Tympanic membrane normal.  Left Ear: Tympanic membrane normal.  Nose: Right sinus exhibits no maxillary sinus tenderness and no frontal sinus tenderness. Left sinus exhibits no maxillary sinus tenderness and no frontal sinus tenderness.    Mouth/Throat: Uvula is midline, oropharynx is clear and moist and mucous membranes are normal.  Small pustule on Right side of nose and inside Right nostril.   Eyes: EOM are normal.  Neck: Normal range of motion. Neck supple.  Cardiovascular: Normal rate and regular rhythm.  Pulmonary/Chest: Effort normal and breath sounds normal. No stridor. No respiratory distress. She has no wheezes. She has no rales.  Musculoskeletal: Normal range of motion.  Neurological: She is alert and oriented to person, place, and time.  Skin: Skin is warm and dry. She is not diaphoretic.  Psychiatric: She has a normal mood and affect. Her behavior is normal.  Nursing note and vitals reviewed.    UC Treatments / Results  Labs (all labs ordered are listed, but only abnormal results are displayed) Labs Reviewed - No data to display  EKG None  Radiology Dg Chest 2 View  Result Date: 09/19/2017 CLINICAL DATA:  Shortness of breath and cough EXAM: CHEST - 2 VIEW COMPARISON:  09/23/2016 FINDINGS: Cardiac shadow is stable. Scarring in the left lung base is noted. No focal infiltrate or sizable effusion is seen. No bony abnormality is noted. IMPRESSION: Stable scarring in the left base no acute abnormality is seen. Electronically Signed   By: Inez Catalina M.D.   On: 09/19/2017 13:52    Procedures Procedures (including critical care time)  Medications Ordered in UC Medications - No data to display  Initial Impression / Assessment and Plan / UC Course  I have reviewed the triage vital signs and the nursing notes.  Pertinent labs & imaging results that were available during my care of the patient were reviewed by me and considered in my medical decision making (see chart for details).     Discussed imaging with pt. No evidence of bacterial infection in lungs. Will tx for asthma exacerbation. Nose sore- mupirocin ointment.  Final Clinical Impressions(s) / UC Diagnoses   Final diagnoses:  Mild intermittent asthma with exacerbation  Sore in nose     Discharge Instructions      Please take your medication as prescribed and see additional information in this packet to help with your cough and asthma.  Please use the antibiotic ointment as prescribed.  Keep sore clean with warm water and soap. Pat dry. Then apply ointment.  Please follow up with family medicine in 1-2 weeks if not improving.     ED Prescriptions    Medication Sig Dispense Auth. Provider   predniSONE (DELTASONE) 20 MG tablet 3 tabs po day one, then 2 po daily x  4 days 11 tablet Gerarda Fraction, Ezell Poke O, PA-C   cetirizine (ZYRTEC) 10 MG tablet Take 1 tablet (10 mg total) by mouth daily. 30 tablet Noe Gens, PA-C   mupirocin nasal ointment (BACTROBAN) 2 % Apply to Right nostril 3 times daily for 5 days 10 g Noe Gens, PA-C     Controlled Substance Prescriptions [place] Controlled Substance Registry consulted? Not Applicable   Tyrell Antonio 09/19/17 1523

## 2017-09-19 NOTE — Discharge Instructions (Signed)
°  Please take your medication as prescribed and see additional information in this packet to help with your cough and asthma.  Please use the antibiotic ointment as prescribed.  Keep sore clean with warm water and soap. Pat dry. Then apply ointment.  Please follow up with family medicine in 1-2 weeks if not improving.

## 2017-09-19 NOTE — ED Triage Notes (Signed)
Patient reports "couple months" of intermittent coughing and wheezing; used albuterol inhaler this morning.

## 2017-09-21 ENCOUNTER — Telehealth: Payer: Self-pay

## 2017-09-21 NOTE — Telephone Encounter (Signed)
Spoke with patient.  Still has cough, but feeling about the same.  Will follow up as needed.

## 2017-10-05 ENCOUNTER — Ambulatory Visit (INDEPENDENT_AMBULATORY_CARE_PROVIDER_SITE_OTHER): Payer: BLUE CROSS/BLUE SHIELD | Admitting: *Deleted

## 2017-10-05 DIAGNOSIS — E538 Deficiency of other specified B group vitamins: Secondary | ICD-10-CM | POA: Diagnosis not present

## 2017-10-05 MED ORDER — CYANOCOBALAMIN 1000 MCG/ML IJ SOLN
1000.0000 ug | Freq: Once | INTRAMUSCULAR | Status: AC
  Administered 2017-10-05: 1000 ug via INTRAMUSCULAR

## 2017-10-19 ENCOUNTER — Other Ambulatory Visit: Payer: Self-pay

## 2017-10-19 ENCOUNTER — Emergency Department
Admission: EM | Admit: 2017-10-19 | Discharge: 2017-10-19 | Disposition: A | Discharge status: Home / Self Care | Payer: BLUE CROSS/BLUE SHIELD | Source: Home / Self Care | Attending: Family Medicine | Admitting: Family Medicine

## 2017-10-19 DIAGNOSIS — J4520 Mild intermittent asthma, uncomplicated: Secondary | ICD-10-CM | POA: Diagnosis not present

## 2017-10-19 MED ORDER — AZITHROMYCIN 250 MG PO TABS: ORAL_TABLET | ORAL | 0 refills | Status: DC

## 2017-10-19 MED ORDER — METHYLPREDNISOLONE ACETATE 80 MG/ML IJ SUSP
80.0000 mg | Freq: Once | INTRAMUSCULAR | Status: AC
  Administered 2017-10-19: 80 mg via INTRAMUSCULAR

## 2017-10-19 MED ORDER — MONTELUKAST SODIUM 10 MG PO TABS: ORAL_TABLET | ORAL | 0 refills | Status: DC

## 2017-10-19 NOTE — ED Provider Notes (Signed)
[name]    CSN: [id number] Arrival date & time: 10/19/17  1052     History   Chief Complaint Chief Complaint  Patient presents with  . Cough  . Chills    HPI [name] is a 47 y.o. female.   Patient reports that she continues to cough since her visit last month, and has not improved with Zyrtec. Her cough is nonproductive.  She has developed chills during the past three days.  No pleuritic pain but feels tight in anterior chest.  She develops wheezing and shortness of breath with activity, improved with albuterol inhaler.  She recalls that Symbicort has not been helpful in the past.   The history is provided by the patient.    Past Medical History:  Diagnosis Date  . Anxiety   . ASTHMA   . DELAYED GASTRIC EMPTYING   . DEPRESSION   . GERD   . Glucose intolerance (impaired glucose tolerance) 04/09/2015  . OBESITY   . OBSTRUCTIVE SLEEP APNEA   . POLYCYSTIC OVARIAN DISEASE   . SMOKER   . SVT (supraventricular tachycardia) ([place])   . Tachycardia   . Umbilical hernia   . Umbilical hernia   . Vitamin B12 deficiency 03/2015 dx   start IM replacement q 30d  . Vitamin D deficiency 03/2015 dx    Patient Active Problem List   Diagnosis Date Noted  . Prediabetes 05/18/2017  . SOB (shortness of breath) 09/24/2016  . Palpitations 09/24/2016  . Family history of early CAD 03/23/2016  . SVT (supraventricular tachycardia) ([place]) 03/14/2016  . Adrenal nodule ([place]) 01/06/2016  . Umbilical hernia 42/35/3614  . Morbid obesity ([place]) 04/09/2015  . Vitamin D deficiency   . Vitamin B12 deficiency   . Anxiety   . Tobacco abuse 01/28/2009  . GERD 01/28/2009  . POLYCYSTIC OVARIAN DISEASE 02/10/2007  . Obstructive sleep apnea 02/10/2007  . Asthma 02/10/2007    Past Surgical History:  Procedure Laterality Date  . CESAREAN SECTION  97 & 98   x's 2  . Cocxyl removal  1995  . DILATION AND CURETTAGE OF UTERUS    . LAPAROSCOPY    . TUBAL LIGATION  1998    OB  History    Gravida  3   Para  2   Term  2   Preterm      AB  1   Living        SAB      TAB  1   Ectopic      Multiple      Live Births               Home Medications    Prior to Admission medications   Medication Sig Start Date End Date Taking? Authorizing Provider  albuterol (PROAIR HFA) 108 (90 Base) MCG/ACT inhaler Inhale 2 puffs into the lungs every 4 (four) hours as needed. 08/22/17   Binnie Rail, MD  ALPRAZolam Duanne Moron) 0.5 MG tablet Take 1 tablet (0.5 mg total) by mouth 3 times/day as needed-between meals & bedtime for anxiety. 05/19/17   Binnie Rail, MD  azithromycin (ZITHROMAX Z-PAK) 250 MG tablet Take 2 tabs today; then begin one tab once daily for 4 more days. 10/19/17   Kandra Nicolas, MD  cetirizine (ZYRTEC) 10 MG tablet Take 1 tablet (10 mg total) by mouth daily. 09/19/17   Noe Gens, PA-C  Cholecalciferol (VITAMIN D) 2000 units tablet Take 3,000 Units by mouth daily.  [provider]  lisinopril (PRINIVIL,ZESTRIL) 5 MG tablet Take 1 tablet (5 mg total) by mouth daily. 05/19/17   Binnie Rail, MD  metoprolol tartrate (LOPRESSOR) 25 MG tablet Take 25 mg by mouth daily.    [provider]  montelukast (SINGULAIR) 10 MG tablet Take one tab by mouth at bedtime 10/19/17   Kandra Nicolas, MD  mupirocin nasal ointment (BACTROBAN) 2 % Apply to Right nostril 3 times daily for 5 days 09/19/17   Noe Gens, PA-C  pantoprazole (PROTONIX) 40 MG tablet Take 40 mg by mouth daily.    [provider]  PARoxetine (PAXIL) 20 MG tablet Take 1 tablet (20 mg total) by mouth daily. 05/19/17   Binnie Rail, MD  valACYclovir (VALTREX) 1000 MG tablet TAKE 2 TABLETS EVERY 12 HOURS FOR 1 DAY START ASAP AFTER SYTPTOM ONSET 02/09/17   Binnie Rail, MD  valACYclovir (VALTREX) 1000 MG tablet TAKE 2 TABLETS EVERY 12 HOURS FOR 1 DAY START ASAP AFTER SYTPTOM ONSET 07/18/17   Binnie Rail, MD    Family History Family History  Problem Relation  Age of Onset  . Breast cancer Mother        dx 11/2009  . Diabetes Maternal Aunt   . Prostate cancer Maternal Grandfather 45       also hx colon polyps  . Heart disease Paternal Grandmother   . Pancreatic cancer Paternal Grandmother 75  . Heart murmur Sister   . Adrenal disorder Neg Hx     Social History Social History   Tobacco Use  . Smoking status: Current Some Day Smoker    Packs/day: 0.25    Years: 30.00    Pack years: 7.50    Types: Cigarettes    Start date: 10/23/1987  . Smokeless tobacco: Never Used  Substance Use Topics  . Alcohol use: Yes    Alcohol/week: 0.0 oz    Comment: Rarely  . Drug use: No     Allergies   Patient has no known allergies.   Review of Systems Review of Systems No sore throat + cough No pleuritic pain + wheezing ? nasal congestion + post-nasal drainage No sinus pain/pressure No itchy/red eyes No earache No hemoptysis + SOB No fever, + chills No nausea No vomiting No abdominal pain No diarrhea No urinary symptoms No skin rash + fatigue No myalgias No headache Used OTC meds without relief  Physical Exam Triage Vital Signs ED Triage Vitals  Enc Vitals Group     BP 10/19/17 1118 126/85     Pulse Rate 10/19/17 1118 73     Resp --      Temp 10/19/17 1118 97.8 F (36.6 C)     Temp Source 10/19/17 1118 Oral     SpO2 10/19/17 1118 97 %     Weight 10/19/17 1119 (!) 340 lb (154.2 kg)     Height 10/19/17 1119 5' 5"  (1.651 m)     Head Circumference --      Peak Flow --      Pain Score 10/19/17 1119 5     Pain Loc --      Pain Edu? --      Excl. in [place]? --    No data found.  Updated Vital Signs BP 126/85 (BP Location: Right Arm)   Pulse 73   Temp 97.8 F (36.6 C) (Oral)   Ht 5' 5"  (1.651 m)   Wt (!) 340 lb (154.2 kg)   LMP 10/09/2017  SpO2 97%   BMI 56.58 kg/m   Visual Acuity Right Eye Distance:   Left Eye Distance:   Bilateral Distance:    Right Eye Near:   Left Eye Near:    Bilateral Near:      Physical Exam Nursing notes and Vital Signs reviewed. Appearance:  Patient appears stated age, and in no acute distress Eyes:  Pupils are equal, round, and reactive to light and accomodation.  Extraocular movement is intact.  Conjunctivae are not inflamed  Ears:  Canals normal.  Tympanic membranes normal.  Nose:  Mildly congested turbinates.  No sinus tenderness.   Pharynx:  Normal Neck:  Supple.  No adenopathy.  Lungs:  Few scattered expiratory wheezes posteriorly.  Breath sounds are equal.  Moving air well. Heart:  Regular rate and rhythm without murmurs, rubs, or gallops.  Abdomen:  Mild tenderness around umbilical hernia without masses or hepatosplenomegaly.  Bowel sounds are present.  No CVA or flank tenderness.  Extremities:  Trace edema.  Skin:  No rash present.    UC Treatments / Results  Labs (all labs ordered are listed, but only abnormal results are displayed) Labs Reviewed - No data to display  EKG None  Radiology No results found.  Procedures Procedures (including critical care time)  Medications Ordered in UC Medications  methylPREDNISolone acetate (DEPO-MEDROL) injection 80 mg (80 mg Intramuscular Given 10/19/17 1154)    Initial Impression / Assessment and Plan / UC Course  I have reviewed the triage vital signs and the nursing notes.  Pertinent labs & imaging results that were available during my care of the patient were reviewed by me and considered in my medical decision making (see chart for details).    With onset of chills during the past three days, suspect onset of bacterial bronchitis.  Will begin empiric Z-pak. Administered Depo Medrol 80m IM  Begin trial of Singulair. Followup with Family Doctor if not improved in 7 to 10 days. Final Clinical Impressions(s) / UC Diagnoses   Final diagnoses:  Mild intermittent asthmatic bronchitis without complication     Discharge Instructions     Take plain guaifenesin (12035mextended release tabs  such as Mucinex) twice daily, with plenty of water, for cough and congestion.  Get adequate rest.    Also recommend using saline nasal spray several times daily and saline nasal irrigation (AYR is a common brand).  Use Flonase nasal spray each morning after using Afrin nasal spray and saline nasal irrigation. Try warm salt water gargles for sore throat.  Stop Zyrtec.  Continue albuterol inhaler as needed. May take Delsym Cough Suppressant at bedtime for nighttime cough.     ED Prescriptions    Medication Sig Dispense Auth. Provider   azithromycin (ZITHROMAX Z-PAK) 250 MG tablet Take 2 tabs today; then begin one tab once daily for 4 more days. 6 tablet BeKandra NicolasMD   montelukast (SINGULAIR) 10 MG tablet Take one tab by mouth at bedtime 30 tablet BeKandra NicolasMD         BeKandra NicolasMD 10/19/17 12925 [phone]

## 2017-10-19 NOTE — ED Triage Notes (Signed)
Pt was seen here a month ago with same sx.  Cough and congestion has lingered.  Last night had chills.

## 2017-10-19 NOTE — Discharge Instructions (Addendum)
Take plain guaifenesin (1277m extended release tabs such as Mucinex) twice daily, with plenty of water, for cough and congestion.  Get adequate rest.    Also recommend using saline nasal spray several times daily and saline nasal irrigation (AYR is a common brand).  Use Flonase nasal spray each morning after using Afrin nasal spray and saline nasal irrigation. Try warm salt water gargles for sore throat.  Stop Zyrtec.  Continue albuterol inhaler as needed. May take Delsym Cough Suppressant at bedtime for nighttime cough.

## 2017-10-25 ENCOUNTER — Encounter: Payer: Self-pay | Admitting: Internal Medicine

## 2017-10-25 ENCOUNTER — Encounter: Payer: Self-pay | Admitting: Pulmonary Disease

## 2017-10-27 ENCOUNTER — Encounter: Payer: Self-pay | Admitting: Family Medicine

## 2017-10-27 ENCOUNTER — Telehealth: Payer: Self-pay | Admitting: Family Medicine

## 2017-10-27 ENCOUNTER — Ambulatory Visit: Payer: BLUE CROSS/BLUE SHIELD | Admitting: Family Medicine

## 2017-10-27 ENCOUNTER — Other Ambulatory Visit (INDEPENDENT_AMBULATORY_CARE_PROVIDER_SITE_OTHER): Payer: BLUE CROSS/BLUE SHIELD

## 2017-10-27 VITALS — BP 138/78 | HR 88 | Ht 65.0 in | Wt 358.0 lb

## 2017-10-27 DIAGNOSIS — J321 Chronic frontal sinusitis: Secondary | ICD-10-CM | POA: Diagnosis not present

## 2017-10-27 DIAGNOSIS — K118 Other diseases of salivary glands: Secondary | ICD-10-CM

## 2017-10-27 LAB — TSH: TSH: 2.94 u[IU]/mL (ref 0.35–4.50)

## 2017-10-27 MED ORDER — ONDANSETRON HCL 4 MG PO TABS: 4.0000 mg | ORAL_TABLET | Freq: Three times a day (TID) | ORAL | 0 refills | Status: DC | PRN

## 2017-10-27 NOTE — Telephone Encounter (Signed)
Informed of results.   Rosemarie Ax, MD [facility] 10/27/2017, 5:21 PM

## 2017-10-27 NOTE — Patient Instructions (Signed)
Nice to meet you  Please try the anti-inflammatory  Please let us know if your symptoms don't improve and follow up

## 2017-10-27 NOTE — Progress Notes (Signed)
[name] - 47 y.o. female MRN [id number]  Date of birth: June 03, 1970  SUBJECTIVE:  Including CC & ROS.  Chief Complaint  Patient presents with  . Cough  . Cyst    [name] is a 47 y.o. female that is presenting with cough/shortness of breath and a mass under her chin. Cough and shortness of breath ongoing for a couple of weeks. She was treated with prednisone and azithromycin, completed three days ago. She feels like her symptoms have worsened. She has been taking mucinex. She works as a Building control surveyor, she was caring for someone who had pneumonia. She has been treated with antibiotics and steroids roughly 4 times since February.   She noticed the knot under her chin four days ago. Admits to swelling and tenderness. Denies any fevers. Has been at the beach recently. No history of similar symptoms. Had OSA and uses a CPAP.    Review of Systems  Constitutional: Positive for activity change. Negative for fever.  HENT: Positive for sinus pressure.   Respiratory: Positive for cough.   Cardiovascular: Negative for chest pain.  Gastrointestinal: Positive for nausea.  Musculoskeletal: Negative for gait problem.  Skin: Negative for color change.  Neurological: Negative for weakness.  Psychiatric/Behavioral: Negative for agitation.    HISTORY: Past Medical, Surgical, Social, and Family History Reviewed & Updated per EMR.   Pertinent Historical Findings include:  Past Medical History:  Diagnosis Date  . Anxiety   . ASTHMA   . DELAYED GASTRIC EMPTYING   . DEPRESSION   . GERD   . Glucose intolerance (impaired glucose tolerance) 04/09/2015  . OBESITY   . OBSTRUCTIVE SLEEP APNEA   . POLYCYSTIC OVARIAN DISEASE   . SMOKER   . SVT (supraventricular tachycardia) ([place])   . Tachycardia   . Umbilical hernia   . Umbilical hernia   . Vitamin B12 deficiency 03/2015 dx   start IM replacement q 30d  . Vitamin D deficiency 03/2015 dx    Past Surgical History:  Procedure Laterality Date  .  CESAREAN SECTION  97 & 98   x's 2  . Cocxyl removal  1995  . DILATION AND CURETTAGE OF UTERUS    . LAPAROSCOPY    . TUBAL LIGATION  1998    No Known Allergies  Family History  Problem Relation Age of Onset  . Breast cancer Mother        dx 11/2009  . Diabetes Maternal Aunt   . Prostate cancer Maternal Grandfather 85       also hx colon polyps  . Heart disease Paternal Grandmother   . Pancreatic cancer Paternal Grandmother 5  . Heart murmur Sister   . Adrenal disorder Neg Hx      Social History   Socioeconomic History  . Marital status: Married    Spouse name: Not on file  . Number of children: 2  . Years of education: Not on file  . Highest education level: Not on file  Occupational History  . 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 Financial resource strain: Not on file  . Food insecurity:    Worry: Not on file    Inability: Not on file  . Transportation needs:    Medical: Not on file    Non-medical: Not on file  Tobacco Use  . Smoking status: Current Some Day Smoker    Packs/day: 0.25    Years: 30.00  Pack years: 7.50    Types: Cigarettes    Start date: 10/23/1987  . Smokeless tobacco: Never Used  Substance and Sexual Activity  . Alcohol use: Yes    Alcohol/week: 0.0 oz    Comment: Rarely  . Drug use: No  . Sexual activity: Not on file  Lifestyle  . Physical activity:    Days per week: Not on file    Minutes per session: Not on file  . Stress: Not on file  Relationships  . Social connections:    Talks on phone: Not on file    Gets together: Not on file    Attends religious service: Not on file    Active member of club or organization: Not on file    Attends meetings of clubs or organizations: Not on file    Relationship status: Not on file  . Intimate partner violence:    Fear of current or ex partner: Not on file    Emotionally abused: Not on file    Physically abused: Not on file    Forced  sexual activity: Not on file  Other Topics Concern  . Not on file  Social History Narrative   Lives with husband & 2 kids from 1st marriage. (1st husband expired 1998-leukemia) work as Merchandiser, retail, but currently unemployed     PHYSICAL EXAM:  VS: BP 138/78 (BP Location: Left Wrist, Patient Position: Sitting, Cuff Size: Normal)   Pulse 88   Ht 5' 5"  (1.651 m)   Wt (!) 358 lb (162.4 kg)   LMP 10/09/2017   SpO2 98%   BMI 59.57 kg/m  Physical Exam Gen: NAD, alert, cooperative with exam, ENT: normal lips, normal nasal mucosa, submandibular gland enlargement with swelling, no redness and mild tenderness.  Eye: normal EOM, normal conjunctiva and lids CV:  no edema, +2 pedal pulses   Resp: no accessory muscle use, non-labored,   Skin: no rashes, no areas of induration  Neuro: normal tone, normal sensation to touch Psych:  normal insight, alert and oriented MSK: normal gait, normal strength      ASSESSMENT & PLAN:   Chronic frontal sinusitis Reports having 4 bouts of antibiotics since February. No improvement in her sinus problems.  - referral to ENT   Submandibular gland mass Doesn't appear to be infected. Has completed a recent course of azithromycin for sinus problem.  - counseled on supportive care  - if no improvement consider CT  - given indications to follow up.

## 2017-10-30 DIAGNOSIS — K118 Other diseases of salivary glands: Secondary | ICD-10-CM | POA: Insufficient documentation

## 2017-10-30 DIAGNOSIS — J321 Chronic frontal sinusitis: Secondary | ICD-10-CM | POA: Insufficient documentation

## 2017-10-30 NOTE — Assessment & Plan Note (Signed)
Reports having 4 bouts of antibiotics since February. No improvement in her sinus problems.  - referral to ENT

## 2017-10-30 NOTE — Assessment & Plan Note (Signed)
Doesn't appear to be infected. Has completed a recent course of azithromycin for sinus problem.  - counseled on supportive care  - if no improvement consider CT  - given indications to follow up.

## 2017-10-31 ENCOUNTER — Encounter: Payer: Self-pay | Admitting: Internal Medicine

## 2017-10-31 ENCOUNTER — Ambulatory Visit: Payer: BLUE CROSS/BLUE SHIELD | Admitting: Internal Medicine

## 2017-10-31 ENCOUNTER — Ambulatory Visit (INDEPENDENT_AMBULATORY_CARE_PROVIDER_SITE_OTHER): Payer: BLUE CROSS/BLUE SHIELD

## 2017-10-31 VITALS — BP 128/78 | HR 77 | Ht 65.0 in | Wt 357.6 lb

## 2017-10-31 DIAGNOSIS — F17210 Nicotine dependence, cigarettes, uncomplicated: Secondary | ICD-10-CM | POA: Diagnosis not present

## 2017-10-31 DIAGNOSIS — I10 Essential (primary) hypertension: Secondary | ICD-10-CM

## 2017-10-31 DIAGNOSIS — J45991 Cough variant asthma: Secondary | ICD-10-CM

## 2017-10-31 DIAGNOSIS — E538 Deficiency of other specified B group vitamins: Secondary | ICD-10-CM

## 2017-10-31 MED ORDER — LOSARTAN POTASSIUM 50 MG PO TABS: 50.0000 mg | ORAL_TABLET | Freq: Every day | ORAL | 11 refills | Status: DC

## 2017-10-31 MED ORDER — CYANOCOBALAMIN 1000 MCG/ML IJ SOLN
1000.0000 ug | Freq: Once | INTRAMUSCULAR | Status: AC
  Administered 2017-10-31: 1000 ug via INTRAMUSCULAR

## 2017-10-31 NOTE — Patient Instructions (Addendum)
Stop lisinopril  And start losartan 50 mg one daily in its place and many of your symptoms should improve over the next 4 weeks  Continue protonix 40 mg Take 30-60 min before first meal of the day   GERD (REFLUX)  is an extremely common cause of respiratory symptoms just like yours , many times with no obvious heartburn at all.    It can be treated with medication, but also with lifestyle changes including elevation of the head of your bed (ideally with 6 inch  bed blocks),  Smoking cessation, avoidance of late meals, excessive alcohol, and avoid fatty foods, chocolate, peppermint, colas, red wine, and acidic juices such as orange juice.  NO MINT OR MENTHOL PRODUCTS SO NO COUGH DROPS   USE SUGARLESS CANDY INSTEAD (Jolley ranchers or Stover's or Life Savers) or even ice chips will also do - the key is to swallow to prevent all throat clearing. NO OIL BASED VITAMINS - use powdered substitutes.    The key is to stop smoking completely before smoking completely stops you!    Please schedule a follow up office visit in 4 weeks, sooner if needed  with all medications /inhalers/ solutions in hand so we can verify exactly what you are taking. This includes all medications from all doctors and over the counters   - pft's on return

## 2017-10-31 NOTE — Progress Notes (Signed)
[name], female    DOB: 10/18/1970,     MRN: [id number]   Brief patient profile: 46 yowf active smoker healthy childhood/ last IUP in 1998 with baseline wt around 200 with onset of sob 2009 assoc with episodic  cough/wheezing improved on prednisone / some better on albuterol and but worse episode in Feb 2019 and has not improved despite prednisone on saba 3 x daily / lots of cough drops for sensation of dysphagia so self referred to pulmonary clinic 10/31/2017      10/31/2017   1st pulmonary eval / Melvyn Novas   Chief Complaint  Patient presents with  . Pulmonary Consult    Self referral. Pt c/o SOB, cough and trouble swallowing since Feb 2019.  Her cough is non prod and she c/o having a raspy voice.   Dyspnea:  MMRC3 = can't walk 100 yards even at a slow pace at a flat grade s stopping due to sob   Cough: mostly dry /gag /vomit/ pee on herself day and noct  Sleep: on back / 45 degrees/ on cpap per Sood SABA use: using 2-3 per day last 6 h prior Prednisone all it does now is result wt gain    No obvious day to day or daytime variability or assoc excess/ purulent sputum or mucus plugs or hemoptysis or cp or chest tightness, subjective wheeze or overt sinus or hb symptoms.    . Also denies any obvious fluctuation of symptoms with weather or environmental changes or other aggravating or alleviating factors except as outlined above   No unusual exposure hx or h/o childhood pna/ asthma or knowledge of premature birth.  Current Allergies, Complete Past Medical History, Past Surgical History, Family History, and Social History were reviewed in [employer] record.  ROS  The following are not active complaints unless bolded Hoarseness, sore throat, dysphagia, dental problems, itching, sneezing,  nasal congestion or discharge of excess mucus or purulent secretions, ear ache,   fever, chills, sweats, unintended wt loss or wt gain, classically pleuritic or exertional cp,   orthopnea pnd or arm/hand swelling  or leg swelling, presyncope, palpitations, abdominal pain, anorexia, nausea, vomiting, diarrhea  or change in bowel habits or change in bladder habits, change in stools or change in urine, dysuria, hematuria,  rash, arthralgias, visual complaints, headache, numbness, weakness or ataxia or problems with walking or coordination,  change in mood or  memory.              Past Medical History:  Diagnosis Date  . Anxiety   . ASTHMA   . DELAYED GASTRIC EMPTYING   . DEPRESSION   . GERD   . Glucose intolerance (impaired glucose tolerance) 04/09/2015  . OBESITY   . OBSTRUCTIVE SLEEP APNEA   . POLYCYSTIC OVARIAN DISEASE   . SMOKER   . SVT (supraventricular tachycardia) ([place])   . Tachycardia   . Umbilical hernia   . Umbilical hernia   . Vitamin B12 deficiency 03/2015 dx   start IM replacement q 30d  . Vitamin D deficiency 03/2015 dx    Outpatient Medications Prior to Visit  Medication Sig Dispense Refill  . albuterol (PROAIR HFA) 108 (90 Base) MCG/ACT inhaler Inhale 2 puffs into the lungs every 4 (four) hours as needed. 18 g 0  . ALPRAZolam (XANAX) 0.5 MG tablet Take 1 tablet (0.5 mg total) by mouth 3 times/day as needed-between meals & bedtime for anxiety. 30 tablet 0  . cetirizine (ZYRTEC) 10 MG tablet  Take 1 tablet (10 mg total) by mouth daily. 30 tablet 1  . Cholecalciferol (VITAMIN D) 2000 units tablet Take 3,000 Units by mouth daily.     [name] lisinopril (PRINIVIL,ZESTRIL) 5 MG tablet Take 1 tablet (5 mg total) by mouth daily. 90 tablet 3  . metoprolol tartrate (LOPRESSOR) 25 MG tablet Take 25 mg by mouth daily.    . ondansetron (ZOFRAN) 4 MG tablet Take 1 tablet (4 mg total) by mouth every 8 (eight) hours as needed for nausea or vomiting. 20 tablet 0  . pantoprazole (PROTONIX) 40 MG tablet Take 40 mg by mouth daily.    [name] PARoxetine (PAXIL) 20 MG tablet Take 1 tablet (20 mg total) by mouth daily. 90 tablet 1  . UNABLE TO FIND Med Name: CPAP    .  valACYclovir (VALTREX) 1000 MG tablet TAKE 2 TABLETS EVERY 12 HOURS FOR 1 DAY START ASAP AFTER SYTPTOM ONSET 6 tablet 2  . valACYclovir (VALTREX) 1000 MG tablet TAKE 2 TABLETS EVERY 12 HOURS FOR 1 DAY START ASAP AFTER SYTPTOM ONSET 18 tablet 1              Objective:     BP 128/78 (BP Location: Left Arm, Cuff Size: Large)   Pulse 77   Ht 5' 5"  (1.651 m)   Wt (!) 357 lb 9.6 oz (162.2 kg)   LMP 10/09/2017   SpO2 98%   BMI 59.51 kg/m   SpO2: 98 % RA  Wt Readings from Last 3 Encounters:  10/31/17 (!) 357 lb 9.6 oz (162.2 kg)  10/27/17 (!) 358 lb (162.4 kg)  10/19/17 (!) 340 lb (154.2 kg)      Massively obese pleasant wf nad with obvious pseudowheeze   HEENT: nl dentition, turbinates bilaterally, and oropharynx. Nl external ear canals without cough reflex   NECK :  without JVD/Nodes/TM/ nl carotid upstrokes bilaterally   LUNGS: no acc muscle use,  Nl contour chest which is clear to A and P bilaterally without cough on insp or exp maneuvers   CV:  RRR  no s3 or murmur or increase in P2, and trace bil ankle  edema   ABD:   Massively obese with very limited  inspiratory excursion in the supine position. No bruits or organomegaly appreciated, bowel sounds nl  MS:  Nl gait/ ext warm without deformities, calf tenderness, cyanosis or clubbing No obvious joint restrictions   SKIN: warm and dry without lesions    NEURO:  alert, approp, nl sensorium with  no motor or cerebellar deficits apparent.      I personally reviewed images and agree with radiology impression as follows:  CXR:   09/19/17  Cardiac shadow is stable. Scarring in the left lung base is noted. No focal infiltrate or sizable effusion is seen. No bony abnormality is noted. My impression:  Mild reduced lung volumes bil     Assessment   Cough variant asthma Spirometry 10/31/2017  FEV1 2.38 (79%)  Ratio 84 s curvature s rx prior  - trial off acei 10/31/2017 >>>   DDX of  difficult airways management  almost all start with A and  include Adherence, Ace Inhibitors, Acid Reflux, Active Sinus Disease, Alpha 1 Antitripsin deficiency, Anxiety masquerading as Airways dz,  ABPA,  Allergy(esp in young), Aspiration (esp in elderly), Adverse effects of meds,  Active smokers, A bunch of PE's (a small clot burden can't cause this syndrome unless there is already severe underlying pulm or vascular dz with poor reserve) plus two  Bs  = Bronchiectasis and Beta blocker use..and one C= CHF   Adherence is always the initial "prime suspect" and is a multilayered concern that requires a "trust but verify" approach in every patient - starting with knowing how to use medications, especially inhalers, correctly, keeping up with refills and understanding the fundamental difference between maintenance and prns vs those medications only taken for a very short course and then stopped and not refilled.  - return with all meds in hand using a trust but verify approach to confirm accurate Medication  Reconciliation The principal here is that until we are certain that the  patients are doing what we've asked, it makes no sense to ask them to do more.    ACEi adverse effects at the  top of the usual list of suspects and the only way to rule it out is a trial off > see a/p     Active smoking > see sep a/p   ? Acid (or non-acid) GERD > always difficult to exclude as up to 75% of pts in some series report no assoc GI/ Heartburn symptoms> rec  Continue ppi for acid suppression/ diet restrictions reviewed and instructions given in writing including avoid cough drops which typically include mint or menthol.   ? Allergy/rhinitis/sinusitis  > continue zyrtec, consider w/u next if not better off acei  ? Anxiety > usually at the bottom of this list of usual suspects but should be much higher on this pt's based on H and P and note already on psychotropics and may interfere with adherence and also interpretation of response or lack thereof  to symptom management which can be quite subjective.    F/u in 4 weeks with full pfts       Cigarette smoker 4-5 min discussion re active cigarette smoking in addition to office E&M  Ask about tobacco use:   ongoing Advise quitting    I reviewed the Fletcher curve with the patient that basically indicates  if you quit smoking when your best day FEV1 is still well preserved (as is clearly  the case here)  it is highly unlikely you will progress to severe disease and informed the patient there was  no medication on the market that has proven to alter the curve/ its downward trajectory  or the likelihood of progression of their disease(unlike other chronic medical conditions such as atheroclerosis where we do think we can change the natural hx with risk reducing meds)    Therefore stopping smoking and maintaining abstinence are  the most important aspects of care, not choice of inhalers or for that matter, doctors.   Treatment other than smoking cessation  is entirely directed by severity of symptoms and focused also on reducing exacerbations, not attempting to change the natural history of the disease.   Assess willingness:  Not committed at this point Assist in quit attempt:  Per PCP when ready Arrange follow up:   Follow up per Primary Care planned        Morbid obesity ([place]) Body mass index is 59.51 kg/m.  -   ? Partially due to repeated need for prednisone ?  Lab Results  Component Value Date   TSH 2.94 10/27/2017     Contributing to gerd risk/ doe/reviewed the need and the process to achieve and maintain neg calorie balance   Rec avoid further prednisone if possible /  defer f/u primary care including intermittently monitoring thyroid status        Essential  hypertension In the best review of chronic cough to date ( NEJM [phone]) ,  ACEi are now felt to cause cough in up to  20% of pts which is a 4 fold increase from previous reports and does not include the  variety of non-specific complaints we see in pulmonary clinic in pts on ACEi but previously attributed to another dx like  Copd/asthma and  include PNDS, throat and chest congestion, "bronchitis", unexplained dyspnea and noct "strangling" sensations, and hoarseness, but also  atypical /refractory GERD symptoms like dysphagia and "bad heartburn"   The only way I know  to prove this is not an "ACEi Case" is a trial off ACEi x a minimum of 6 weeks then regroup.    Try losaratan 50 mg daily and f/u in 4 weeks      Total time devoted to counseling  > 50 % of initial 60 min office visit:  review case with pt/ discussion of options/alternatives/ personally creating written customized instructions  in presence of pt  then going over those specific  Instructions directly with the pt including how to use all of the meds but in particular covering each new medication in detail and the difference between the maintenance= "automatic" meds and the prns using an action plan format for the latter (If this problem/symptom => do that organization reading Left to right).  Please see AVS from this visit for a full list of these instructions which I personally wrote for this pt and  are unique to this visit.      Christinia Gully, MD 10/31/2017

## 2017-10-31 NOTE — Progress Notes (Signed)
I have reviewed and agree.

## 2017-11-01 ENCOUNTER — Encounter: Payer: Self-pay | Admitting: Internal Medicine

## 2017-11-01 DIAGNOSIS — I10 Essential (primary) hypertension: Secondary | ICD-10-CM | POA: Insufficient documentation

## 2017-11-01 NOTE — Assessment & Plan Note (Addendum)
Spirometry 10/31/2017  FEV1 2.38 (79%)  Ratio 84 s curvature s rx prior  - trial off acei 10/31/2017 >>>   DDX of  difficult airways management almost all start with A and  include Adherence, Ace Inhibitors, Acid Reflux, Active Sinus Disease, Alpha 1 Antitripsin deficiency, Anxiety masquerading as Airways dz,  ABPA,  Allergy(esp in young), Aspiration (esp in elderly), Adverse effects of meds,  Active smokers, A bunch of PE's (a small clot burden can't cause this syndrome unless there is already severe underlying pulm or vascular dz with poor reserve) plus two Bs  = Bronchiectasis and Beta blocker use..and one C= CHF   Adherence is always the initial "prime suspect" and is a multilayered concern that requires a "trust but verify" approach in every patient - starting with knowing how to use medications, especially inhalers, correctly, keeping up with refills and understanding the fundamental difference between maintenance and prns vs those medications only taken for a very short course and then stopped and not refilled.  - return with all meds in hand using a trust but verify approach to confirm accurate Medication  Reconciliation The principal here is that until we are certain that the  patients are doing what we've asked, it makes no sense to ask them to do more.    ACEi adverse effects at the  top of the usual list of suspects and the only way to rule it out is a trial off > see a/p     Active smoking > see sep a/p   ? Acid (or non-acid) GERD > always difficult to exclude as up to 75% of pts in some series report no assoc GI/ Heartburn symptoms> rec  Continue ppi for acid suppression/ diet restrictions reviewed and instructions given in writing including avoid cough drops which typically include mint or menthol.   ? Allergy/rhinitis/sinusitis  > continue zyrtec, consider w/u next if not better off acei  ? Anxiety > usually at the bottom of this list of usual suspects but should be much higher on  this pt's based on H and P and note already on psychotropics and may interfere with adherence and also interpretation of response or lack thereof to symptom management which can be quite subjective.    F/u in 4 weeks with full pfts

## 2017-11-01 NOTE — Assessment & Plan Note (Signed)
4-5 min discussion re active cigarette smoking in addition to office E&M  Ask about tobacco use:   ongoing Advise quitting    I reviewed the Fletcher curve with the patient that basically indicates  if you quit smoking when your best day FEV1 is still well preserved (as is clearly  the case here)  it is highly unlikely you will progress to severe disease and informed the patient there was  no medication on the market that has proven to alter the curve/ its downward trajectory  or the likelihood of progression of their disease(unlike other chronic medical conditions such as atheroclerosis where we do think we can change the natural hx with risk reducing meds)    Therefore stopping smoking and maintaining abstinence are  the most important aspects of care, not choice of inhalers or for that matter, doctors.   Treatment other than smoking cessation  is entirely directed by severity of symptoms and focused also on reducing exacerbations, not attempting to change the natural history of the disease.   Assess willingness:  Not committed at this point Assist in quit attempt:  Per PCP when ready Arrange follow up:   Follow up per Primary Care planned

## 2017-11-01 NOTE — Assessment & Plan Note (Signed)
In the best review of chronic cough to date ( NEJM 2016 375 [phone]) ,  ACEi are now felt to cause cough in up to  20% of pts which is a 4 fold increase from previous reports and does not include the variety of non-specific complaints we see in pulmonary clinic in pts on ACEi but previously attributed to another dx like  Copd/asthma and  include PNDS, throat and chest congestion, "bronchitis", unexplained dyspnea and noct "strangling" sensations, and hoarseness, but also  atypical /refractory GERD symptoms like dysphagia and "bad heartburn"   The only way I know  to prove this is not an "ACEi Case" is a trial off ACEi x a minimum of 6 weeks then regroup.    Try losaratan 50 mg daily and f/u in 4 weeks      Total time devoted to counseling  > 50 % of initial 60 min office visit:  review case with pt/ discussion of options/alternatives/ personally creating written customized instructions  in presence of pt  then going over those specific  Instructions directly with the pt including how to use all of the meds but in particular covering each new medication in detail and the difference between the maintenance= "automatic" meds and the prns using an action plan format for the latter (If this problem/symptom => do that organization reading Left to right).  Please see AVS from this visit for a full list of these instructions which I personally wrote for this pt and  are unique to this visit.

## 2017-11-01 NOTE — Assessment & Plan Note (Addendum)
Body mass index is 59.51 kg/m.  -   ? Partially due to repeated need for prednisone ?  Lab Results  Component Value Date   TSH 2.94 10/27/2017     Contributing to gerd risk/ doe/reviewed the need and the process to achieve and maintain neg calorie balance   Rec avoid further prednisone if possible /  defer f/u primary care including intermittently monitoring thyroid status

## 2017-11-08 ENCOUNTER — Other Ambulatory Visit: Payer: Self-pay | Admitting: Cardiovascular Disease

## 2017-11-23 NOTE — Progress Notes (Signed)
Subjective:    Patient ID: [name], female    DOB: Oct 15, 1970, 47 y.o.   MRN: [id number]  HPI The patient is here for follow up.  Hypertension: She is taking her medication daily. She is compliant with a low sodium diet.  She denies chest pain, palpitations, edema, shortness of breath and regular headaches. She is exercising regularly - walking.      Anxiety, depression: She is taking her paxil daily as prescribed. She denies any side effects from the medication. She does not take the xanax - she was not sure if she could take it while on the paxil and was concerned about getting addicted.  She has attacks of anxiety, especially at night.  She is also feeling some depression.  She feels overwhelmed.     Prediabetes:  She is compliant with a low sugar/carbohydrate diet.  She is exercising regularly - walking.  GERD:  She is taking her medication daily as prescribed.  She denies any GERD symptoms and feels her GERD is well controlled.   She has had a few infections in the past several months and has been on steroids each time.  This has caused her to gain back the weight she has lost.   She has a lump in her neck - noticed it in July.  She sees ENT next week.    Morbid obesity:  She is walking - her goal is 5000 steps a day.  She feels like she is eating well, but can't lose weight. She wants to know what else she can do.    She is taking her vitamins daily.    Medications and allergies reviewed with patient and updated if appropriate.  Patient Active Problem List   Diagnosis Date Noted  . Essential hypertension 11/01/2017  . Chronic frontal sinusitis 10/30/2017  . Submandibular gland mass 10/30/2017  . Prediabetes 05/18/2017  . SOB (shortness of breath) 09/24/2016  . Palpitations 09/24/2016  . Family history of early CAD 03/23/2016  . SVT (supraventricular tachycardia) ([place]) 03/14/2016  . Adrenal nodule ([place]) 01/06/2016  . Umbilical hernia 03/55/9741  . Morbid obesity  ([place]) 04/09/2015  . Vitamin D deficiency   . Vitamin B12 deficiency   . Anxiety   . Cigarette smoker 01/28/2009  . GERD 01/28/2009  . POLYCYSTIC OVARIAN DISEASE 02/10/2007  . Obstructive sleep apnea 02/10/2007  . Cough variant asthma 02/10/2007    Current Outpatient Medications on File Prior to Visit  Medication Sig Dispense Refill  . albuterol (PROAIR HFA) 108 (90 Base) MCG/ACT inhaler Inhale 2 puffs into the lungs every 4 (four) hours as needed. 18 g 0  . ALPRAZolam (XANAX) 0.5 MG tablet Take 1 tablet (0.5 mg total) by mouth 3 times/day as needed-between meals & bedtime for anxiety. 30 tablet 0  . cetirizine (ZYRTEC) 10 MG tablet Take 1 tablet (10 mg total) by mouth daily. 30 tablet 1  . Cholecalciferol (VITAMIN D) 2000 units tablet Take 3,000 Units by mouth daily.     [name] losartan (COZAAR) 50 MG tablet Take 1 tablet (50 mg total) by mouth daily. 30 tablet 11  . metoprolol tartrate (LOPRESSOR) 25 MG tablet TAKE 1 TABLET BY MOUTH TWICE A DAY (Patient taking differently: 12.5 mg. ) 180 tablet 1  . ondansetron (ZOFRAN) 4 MG tablet Take 1 tablet (4 mg total) by mouth every 8 (eight) hours as needed for nausea or vomiting. 20 tablet 0  . pantoprazole (PROTONIX) 40 MG tablet Take 40 mg by mouth  daily.    [name] PARoxetine (PAXIL) 20 MG tablet Take 1 tablet (20 mg total) by mouth daily. 90 tablet 1  . UNABLE TO FIND Med Name: CPAP    . valACYclovir (VALTREX) 1000 MG tablet TAKE 2 TABLETS EVERY 12 HOURS FOR 1 DAY START ASAP AFTER SYTPTOM ONSET 6 tablet 2   No current facility-administered medications on file prior to visit.     Past Medical History:  Diagnosis Date  . Anxiety   . ASTHMA   . DELAYED GASTRIC EMPTYING   . DEPRESSION   . GERD   . Glucose intolerance (impaired glucose tolerance) 04/09/2015  . OBESITY   . OBSTRUCTIVE SLEEP APNEA   . POLYCYSTIC OVARIAN DISEASE   . SMOKER   . SVT (supraventricular tachycardia) ([place])   . Tachycardia   . Umbilical hernia   . Umbilical hernia   .  Vitamin B12 deficiency 03/2015 dx   start IM replacement q 30d  . Vitamin D deficiency 03/2015 dx    Past Surgical History:  Procedure Laterality Date  . CESAREAN SECTION  97 & 98   x's 2  . Cocxyl removal  1995  . DILATION AND CURETTAGE OF UTERUS    . LAPAROSCOPY    . TUBAL LIGATION  1998    Social History   Socioeconomic History  . Marital status: Married    Spouse name: Not on file  . Number of children: 2  . Years of education: Not on file  . Highest education level: Not on file  Occupational History  . 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 Financial resource strain: Not on file  . Food insecurity:    Worry: Not on file    Inability: Not on file  . Transportation needs:    Medical: Not on file    Non-medical: Not on file  Tobacco Use  . Smoking status: Former Smoker    Packs/day: 0.25    Years: 30.00    Pack years: 7.50    Types: Cigarettes    Start date: 10/23/1987    Last attempt to quit: 10/23/2017    Years since quitting: 0.0  . Smokeless tobacco: Never Used  Substance and Sexual Activity  . Alcohol use: Yes    Alcohol/week: 0.0 standard drinks    Comment: Rarely  . Drug use: No  . Sexual activity: Not on file  Lifestyle  . Physical activity:    Days per week: Not on file    Minutes per session: Not on file  . Stress: Not on file  Relationships  . Social connections:    Talks on phone: Not on file    Gets together: Not on file    Attends religious service: Not on file    Active member of club or organization: Not on file    Attends meetings of clubs or organizations: Not on file    Relationship status: Not on file  Other Topics Concern  . Not on file  Social History Narrative   Lives with husband & 2 kids from 1st marriage. (1st husband expired 1998-leukemia) work as Merchandiser, retail, but currently unemployed    Family History  Problem Relation Age of Onset  . Breast cancer Mother         dx 11/2009  . Diabetes Maternal Aunt   . Prostate cancer Maternal Grandfather 7       also hx colon polyps  .  Heart disease Paternal Grandmother   . Pancreatic cancer Paternal Grandmother 60  . Heart murmur Sister   . Adrenal disorder Neg Hx     Review of Systems  Constitutional: Negative for chills and fever.  Respiratory: Positive for shortness of breath (chronic) and wheezing (intermittent). Negative for cough.   Cardiovascular: Positive for palpitations (with anxiety) and leg swelling. Negative for chest pain.  Neurological: Positive for light-headedness and headaches.       Objective:   Vitals:   11/24/17 1436  BP: 110/70  Pulse: 78  Resp: 16  Temp: 98.2 F (36.8 C)  SpO2: 97%   BP Readings from Last 3 Encounters:  11/24/17 110/70  10/31/17 128/78  10/27/17 138/78   Wt Readings from Last 3 Encounters:  11/24/17 (!) 358 lb (162.4 kg)  10/31/17 (!) 357 lb 9.6 oz (162.2 kg)  10/27/17 (!) 358 lb (162.4 kg)   Body mass index is 59.57 kg/m.   Physical Exam    Constitutional: Appears well-developed and well-nourished. No distress.  HENT:  Head: Normocephalic and atraumatic.  Neck: Neck supple. No tracheal deviation present. No thyromegaly present.  No cervical lymphadenopathy, submandibular mass - non tender Cardiovascular: Normal rate, regular rhythm and normal heart sounds.   No murmur heard. No carotid bruit .  No edema Pulmonary/Chest: Effort normal and breath sounds normal. No respiratory distress. No has no wheezes. No rales.  Skin: Skin is warm and dry. Not diaphoretic.  Psychiatric: Normal mood and affect. Behavior is normal.      Assessment & Plan:    See Problem List for Assessment and Plan of chronic medical problems.

## 2017-11-23 NOTE — Patient Instructions (Addendum)
  Test(s) ordered today. Your results will be released to [facility] (or called to you) after review, usually within 72hours after test completion. If any changes need to be made, you will be notified at that same time.   Medications reviewed and updated.  Changes include starting metformin twice a day.   Your prescription(s) have been submitted to your pharmacy. Please take as directed and contact our office if you believe you are having problem(s) with the medication(s).  A referral was ordered for derm and weight clinic  Please followup in 6 months for a physical

## 2017-11-24 ENCOUNTER — Encounter: Payer: Self-pay | Admitting: Internal Medicine

## 2017-11-24 ENCOUNTER — Other Ambulatory Visit (INDEPENDENT_AMBULATORY_CARE_PROVIDER_SITE_OTHER): Payer: BLUE CROSS/BLUE SHIELD

## 2017-11-24 ENCOUNTER — Ambulatory Visit: Payer: BLUE CROSS/BLUE SHIELD | Admitting: Internal Medicine

## 2017-11-24 VITALS — BP 110/70 | HR 78 | Temp 98.2°F | Resp 16 | Wt 358.0 lb

## 2017-11-24 DIAGNOSIS — I10 Essential (primary) hypertension: Secondary | ICD-10-CM

## 2017-11-24 DIAGNOSIS — K219 Gastro-esophageal reflux disease without esophagitis: Secondary | ICD-10-CM

## 2017-11-24 DIAGNOSIS — E559 Vitamin D deficiency, unspecified: Secondary | ICD-10-CM | POA: Diagnosis not present

## 2017-11-24 DIAGNOSIS — B001 Herpesviral vesicular dermatitis: Secondary | ICD-10-CM

## 2017-11-24 DIAGNOSIS — F419 Anxiety disorder, unspecified: Secondary | ICD-10-CM

## 2017-11-24 DIAGNOSIS — R7303 Prediabetes: Secondary | ICD-10-CM

## 2017-11-24 DIAGNOSIS — L918 Other hypertrophic disorders of the skin: Secondary | ICD-10-CM

## 2017-11-24 DIAGNOSIS — L989 Disorder of the skin and subcutaneous tissue, unspecified: Secondary | ICD-10-CM

## 2017-11-24 DIAGNOSIS — E538 Deficiency of other specified B group vitamins: Secondary | ICD-10-CM

## 2017-11-24 LAB — LIPID PANEL
Cholesterol: 201 mg/dL — ABNORMAL HIGH (ref 0–200)
HDL: 45.3 mg/dL (ref >39.00)
LDL Cholesterol: 133 mg/dL — ABNORMAL HIGH (ref 0–99)
NonHDL: 155.94
Total CHOL/HDL Ratio: 4
Triglycerides: 117 mg/dL (ref 0.0–149.0)
VLDL: 23.4 mg/dL (ref 0.0–40.0)

## 2017-11-24 LAB — COMPREHENSIVE METABOLIC PANEL
ALT: 12 U/L (ref 0–35)
AST: 6 U/L (ref 0–37)
Albumin: 4.1 g/dL (ref 3.5–5.2)
Alkaline Phosphatase: 80 U/L (ref 39–117)
BUN: 14 mg/dL (ref 6–23)
CO2: 30 mEq/L (ref 19–32)
Calcium: 9.6 mg/dL (ref 8.4–10.5)
Chloride: 102 mEq/L (ref 96–112)
Creatinine, Ser: 0.98 mg/dL (ref 0.40–1.20)
GFR: 64.97 mL/min (ref >60.00)
Glucose, Bld: 93 mg/dL (ref 70–99)
Potassium: 4 mEq/L (ref 3.5–5.1)
Sodium: 138 mEq/L (ref 135–145)
Total Bilirubin: 0.4 mg/dL (ref 0.2–1.2)
Total Protein: 7.8 g/dL (ref 6.0–8.3)

## 2017-11-24 LAB — HEMOGLOBIN A1C: Hgb A1c MFr Bld: 6.1 % (ref 4.6–6.5)

## 2017-11-24 MED ORDER — VALACYCLOVIR HCL 1 G PO TABS: ORAL_TABLET | ORAL | 2 refills | Status: DC

## 2017-11-24 MED ORDER — METFORMIN HCL 500 MG PO TABS: 500.0000 mg | ORAL_TABLET | Freq: Two times a day (BID) | ORAL | 3 refills | Status: DC

## 2017-11-24 NOTE — Assessment & Plan Note (Signed)
GERD controlled Continue daily medication

## 2017-11-24 NOTE — Assessment & Plan Note (Signed)
Taking 4000/day Will check level

## 2017-11-24 NOTE — Assessment & Plan Note (Signed)
Doing injections monthly continue

## 2017-11-25 ENCOUNTER — Encounter: Payer: Self-pay | Admitting: Internal Medicine

## 2017-11-25 DIAGNOSIS — L918 Other hypertrophic disorders of the skin: Secondary | ICD-10-CM | POA: Insufficient documentation

## 2017-11-25 DIAGNOSIS — B001 Herpesviral vesicular dermatitis: Secondary | ICD-10-CM | POA: Insufficient documentation

## 2017-11-25 DIAGNOSIS — L989 Disorder of the skin and subcutaneous tissue, unspecified: Secondary | ICD-10-CM | POA: Insufficient documentation

## 2017-11-25 NOTE — Assessment & Plan Note (Signed)
Discussed weight loss at length Hopefully since stopping smoking she will not get sick as often and can avoid steroids Continue regular exercise - increase  Small portions, healthy diet  Referred to weight management clinic Start metformin

## 2017-11-25 NOTE — Assessment & Plan Note (Signed)
Check a1c Low sugar / carb diet Stressed regular exercise,keeping weight down/weight loss Referred to weight management clinic Start metformin

## 2017-11-25 NOTE — Assessment & Plan Note (Signed)
paxil works well Discussed use of xanax - she needs to take this more often - she is low risk for addiction and her anxiety is not ideally controlled due to frequent anxiety attacks

## 2017-11-25 NOTE — Assessment & Plan Note (Signed)
Referred to derm

## 2017-11-25 NOTE — Assessment & Plan Note (Signed)
BP well controlled Current regimen effective and well tolerated Continue current medications at current doses cmp  

## 2017-11-25 NOTE — Assessment & Plan Note (Signed)
Taking valtrex prn

## 2017-11-27 ENCOUNTER — Encounter: Payer: Self-pay | Admitting: Internal Medicine

## 2017-11-27 LAB — VITAMIN D 25 HYDROXY (VIT D DEFICIENCY, FRACTURES): VITD: 46.07 ng/mL (ref 30.00–100.00)

## 2017-12-01 ENCOUNTER — Ambulatory Visit (INDEPENDENT_AMBULATORY_CARE_PROVIDER_SITE_OTHER): Payer: BLUE CROSS/BLUE SHIELD

## 2017-12-01 DIAGNOSIS — E538 Deficiency of other specified B group vitamins: Secondary | ICD-10-CM

## 2017-12-01 MED ORDER — CYANOCOBALAMIN 1000 MCG/ML IJ SOLN
1000.0000 ug | Freq: Once | INTRAMUSCULAR | Status: AC
  Administered 2017-12-01: 1000 ug via INTRAMUSCULAR

## 2017-12-01 NOTE — Progress Notes (Signed)
b12 Injection given.   Binnie Rail, MD

## 2017-12-03 ENCOUNTER — Other Ambulatory Visit: Payer: Self-pay | Admitting: Internal Medicine

## 2017-12-05 ENCOUNTER — Ambulatory Visit: Payer: BLUE CROSS/BLUE SHIELD | Admitting: Internal Medicine

## 2017-12-05 ENCOUNTER — Ambulatory Visit (INDEPENDENT_AMBULATORY_CARE_PROVIDER_SITE_OTHER): Payer: BLUE CROSS/BLUE SHIELD | Admitting: Internal Medicine

## 2017-12-05 ENCOUNTER — Encounter: Payer: Self-pay | Admitting: Internal Medicine

## 2017-12-05 VITALS — BP 118/84 | HR 78 | Ht 65.5 in | Wt 363.0 lb

## 2017-12-05 DIAGNOSIS — I10 Essential (primary) hypertension: Secondary | ICD-10-CM

## 2017-12-05 DIAGNOSIS — J45991 Cough variant asthma: Secondary | ICD-10-CM

## 2017-12-05 LAB — PULMONARY FUNCTION TEST
DL/VA % pred: 103 %
DL/VA: 5.15 ml/min/mmHg/L
DLCO unc % pred: 75 %
DLCO unc: 19.78 ml/min/mmHg
FEF 25-75 Post: 2.57 L/sec
FEF 25-75 Pre: 1.56 L/sec
FEF2575-%Change-Post: 64 %
FEF2575-%Pred-Post: 85 %
FEF2575-%Pred-Pre: 52 %
FEV1-%Change-Post: 13 %
FEV1-%Pred-Post: 71 %
FEV1-%Pred-Pre: 63 %
FEV1-Post: 2.19 L
FEV1-Pre: 1.92 L
FEV1FVC-%Change-Post: 8 %
FEV1FVC-%Pred-Pre: 96 %
FEV6-%Change-Post: 5 %
FEV6-%Pred-Post: 69 %
FEV6-%Pred-Pre: 66 %
FEV6-Post: 2.59 L
FEV6-Pre: 2.47 L
FEV6FVC-%Pred-Post: 102 %
FEV6FVC-%Pred-Pre: 102 %
FVC-%Change-Post: 4 %
FVC-%Pred-Post: 68 %
FVC-%Pred-Pre: 64 %
FVC-Post: 2.59 L
FVC-Pre: 2.47 L
Post FEV1/FVC ratio: 84 %
Post FEV6/FVC ratio: 100 %
Pre FEV1/FVC ratio: 78 %
Pre FEV6/FVC Ratio: 100 %
RV % pred: 94 %
RV: 1.68 L
TLC % pred: 81 %
TLC: 4.31 L

## 2017-12-05 MED ORDER — FUROSEMIDE 20 MG PO TABS: ORAL_TABLET | ORAL | 2 refills | Status: DC

## 2017-12-05 NOTE — Progress Notes (Signed)
PFT done today. 

## 2017-12-05 NOTE — Patient Instructions (Addendum)
Add:   one hour before bed:   pepcid ac 20 mg and  Chlorpheniramine 4 mg at bedtime until return to see if all coughing resolves  Stop zyrtec for now   For leg swelling take Lasix 20 mg one daily as needed  Please schedule a follow up office visit in 4 weeks, call sooner if needed

## 2017-12-05 NOTE — Progress Notes (Signed)
[name], female    DOB: 31-Oct-1970,     MRN: [id number]   Brief patient profile: 46 yowf active smoker healthy childhood/ last IUP in 1998 with baseline wt around 200 with onset of sob 2009 assoc with episodic  cough/wheezing improved on prednisone / some better on albuterol and but worse episode in Feb 2019 and has not improved despite prednisone on saba 3 x daily / lots of cough drops for sensation of dysphagia so self referred to pulmonary clinic 10/31/2017      10/31/2017   1st pulmonary eval / Melvyn Novas   Chief Complaint  Patient presents with  . Pulmonary Consult    Self referral. Pt c/o SOB, cough and trouble swallowing since Feb 2019.  Her cough is non prod and she c/o having a raspy voice.   Dyspnea:  MMRC3 = can't walk 100 yards even at a slow pace at a flat grade s stopping due to sob   Cough: mostly dry /gag /vomit/ pee on herself day and noct  Sleep: on back / 45 degrees/ on cpap per S[place] SABA use: using 2-3 per day last 6 h prior Prednisone all it does now is result wt gain  rec Stop lisinopril  And start losartan 50 mg one daily in its place and many of your symptoms should improve over the next 4 weeks Continue protonix 40 mg Take 30-60 min before first meal of the day  GERD  The key is to stop smoking completely before smoking completely stops you!  - pft's on return    12/05/2017  f/u ov/Hayle Parisi re: doe/ cough better and no longer smoking  Chief Complaint  Patient presents with  . Follow-up    PFT's done today. Her breathing is unchanged but her cough seems better. She has not had to use her albuterol.    Dyspnea:  Walking down driveway and back for 10 min then has to stop  Cough: better but still feels sense of pnds at hs  at hs  Sleeping: 45 degrees  SABA use: none 02: none but cpap hs    No obvious day to day or daytime variability or assoc excess/ purulent sputum or mucus plugs or hemoptysis or cp or chest tightness, subjective wheeze or overt sinus or hb  symptoms.    . Also denies any obvious fluctuation of symptoms with weather or environmental changes or other aggravating or alleviating factors except as outlined above   No unusual exposure hx or h/o childhood pna/ asthma or knowledge of premature birth.  Current Allergies, Complete Past Medical History, Past Surgical History, Family History, and Social History were reviewed in [facility] record.  ROS  The following are not active complaints unless bolded Hoarseness, sore throat, dysphagia, dental problems, itching, sneezing,  nasal congestion or discharge of excess mucus or purulent secretions, ear ache,   fever, chills, sweats, unintended wt loss or wt gain, classically pleuritic or exertional cp,  orthopnea pnd or arm/hand swelling  or leg swelling, presyncope, palpitations, abdominal pain, anorexia, nausea, vomiting, diarrhea  or change in bowel habits or change in bladder habits, change in stools or change in urine, dysuria, hematuria,  rash, arthralgias, visual complaints, headache, numbness, weakness or ataxia or problems with walking or coordination,  change in mood or  memory.        Current Meds  Medication Sig  . albuterol (PROAIR HFA) 108 (90 Base) MCG/ACT inhaler Inhale 2 puffs into the lungs every 4 (four)  hours as needed.  . ALPRAZolam (XANAX) 0.5 MG tablet Take 1 tablet (0.5 mg total) by mouth 3 times/day as needed-between meals & bedtime for anxiety.  . cetirizine (ZYRTEC) 10 MG tablet Take 1 tablet (10 mg total) by mouth daily.  . Cholecalciferol (VITAMIN D) 2000 units tablet Take 4,000 Units by mouth daily.   [name] losartan (COZAAR) 50 MG tablet Take 1 tablet (50 mg total) by mouth daily.  . metFORMIN (GLUCOPHAGE) 500 MG tablet Take 1 tablet (500 mg total) by mouth 2 (two) times daily with a meal.  . metoprolol tartrate (LOPRESSOR) 25 MG tablet Take 12.5 mg by mouth 2 (two) times daily.  . ondansetron (ZOFRAN) 4 MG tablet Take 1 tablet (4 mg total) by  mouth every 8 (eight) hours as needed for nausea or vomiting.  . pantoprazole (PROTONIX) 40 MG tablet Take 40 mg by mouth daily.  [name] PARoxetine (PAXIL) 20 MG tablet TAKE 1 TABLET BY MOUTH EVERY DAY  . UNABLE TO FIND Med Name: CPAP  . valACYclovir (VALTREX) 1000 MG tablet TAKE 2 TABLETS EVERY 12 HOURS FOR 1 DAY START ASAP AFTER SYTPTOM ONSET              Objective:    amb obese wf nad   12/05/2017           363   10/31/17 (!) 357 lb 9.6 oz (162.2 kg)  10/27/17 (!) 358 lb (162.4 kg)  10/19/17 (!) 340 lb (154.2 kg)        HEENT: nl dentition, turbinates bilaterally, and oropharynx. Nl external ear canals without cough reflex   NECK :  without JVD/Nodes/TM/ nl carotid upstrokes bilaterally   LUNGS: no acc muscle use,  Nl contour chest which is clear to A and P bilaterally without cough on insp or exp maneuvers   CV:  RRR  no s3 or murmur or increase in P2, and 1+ pitting edema both feet/ankles   ABD:  soft and nontender with nl inspiratory excursion in the supine position. No bruits or organomegaly appreciated, bowel sounds nl  MS:  Nl gait/ ext warm without deformities, calf tenderness, cyanosis or clubbing No obvious joint restrictions   SKIN: warm and dry without lesions    NEURO:  alert, approp, nl sensorium with  no motor or cerebellar deficits apparent.         Assessment

## 2017-12-06 ENCOUNTER — Encounter: Payer: Self-pay | Admitting: Internal Medicine

## 2017-12-06 NOTE — Assessment & Plan Note (Addendum)
ERV 30% on pfts 12/05/2017   Body mass index is 59.49 kg/m.  -  trending up still despite off pred Lab Results  Component Value Date   TSH 2.94 10/27/2017     Contributing to gerd risk/ doe/reviewed the need and the process to achieve and maintain neg calorie balance > defer f/u primary care including intermittently monitoring thyroid status       Final f/u ov planned for 4 weeks   I had an extended discussion with the patient reviewing all relevant studies completed to date and  lasting 15 to 20 minutes of a 25 minute summary office  visit    Each maintenance medication was reviewed in detail including most importantly the difference between maintenance and prns and under what circumstances the prns are to be triggered using an action plan format that is not reflected in the computer generated alphabetically organized AVS.     Please see AVS for specific instructions unique to this visit that I personally wrote and verbalized to the the pt in detail and then reviewed with pt  by my nurse highlighting any  changes in therapy recommended at today's visit to their plan of care.

## 2017-12-06 NOTE — Assessment & Plan Note (Addendum)
Spirometry 10/31/2017  FEV1 2.38 (79%)  Ratio 84 s curvature s rx prior  - trial off acei 10/31/2017 >>> improved 12/05/2017  - PFT's  12/05/2017  FEV1 2.19 (71 % ) ratio 84  p 13 % improvement from saba p nothing prior to study with DLCO  75 % corrects to 103 % for alv volume  And  ERV 30% c/w effects of obesity    Will add noct h1 /h2 at hs for residual uacs but note off acei x 4 weeks the cough is almost completely gone   She has very mild asthma but the main findings are related to obesity so rec avoid pred, continue prn saba  Reviewed the rule of 2's If your breathing worsens or you need to use your rescue inhaler more than twice weekly or wake up more than twice a month with any respiratory symptoms or require more than two rescue inhalers per year, we need to see you right away because this means we're not controlling the underlying problem (inflammation) adequately.  Rescue inhalers (albuterol) do not control inflammation and overuse can lead to unnecessary and costly consequences.  They can make you feel better temporarily but eventually they will quit working effectively much as sleep aids lead to more insomnia if used regularly.

## 2017-12-06 NOTE — Assessment & Plan Note (Addendum)
Stopped  acei 10/31/2017 due to cough > better 12/05/2017   Adequate control on present rx, does have a bit more swelling off acei so added prn lasix 20 mg daily for that     Although even in retrospect it may not be clear the ACEi contributed to the pt's symptoms,  Pt improved off them and adding them back at this point or in the future would risk confusion in interpretation of non-specific respiratory symptoms to which this patient is prone  ie  Better not to muddy the waters here.   Follow up per Primary Care planned

## 2017-12-19 ENCOUNTER — Encounter: Payer: Self-pay | Admitting: Internal Medicine

## 2018-01-02 ENCOUNTER — Ambulatory Visit: Payer: BLUE CROSS/BLUE SHIELD | Admitting: Internal Medicine

## 2018-01-04 ENCOUNTER — Ambulatory Visit (INDEPENDENT_AMBULATORY_CARE_PROVIDER_SITE_OTHER): Payer: BLUE CROSS/BLUE SHIELD

## 2018-01-04 DIAGNOSIS — E538 Deficiency of other specified B group vitamins: Secondary | ICD-10-CM | POA: Diagnosis not present

## 2018-01-04 MED ORDER — CYANOCOBALAMIN 1000 MCG/ML IJ SOLN
1000.0000 ug | Freq: Once | INTRAMUSCULAR | Status: AC
  Administered 2018-01-04: 1000 ug via INTRAMUSCULAR

## 2018-01-05 NOTE — Progress Notes (Signed)
b12 Injection given.   Binnie Rail, MD

## 2018-01-15 ENCOUNTER — Ambulatory Visit: Payer: BLUE CROSS/BLUE SHIELD | Admitting: Internal Medicine

## 2018-02-02 ENCOUNTER — Ambulatory Visit (INDEPENDENT_AMBULATORY_CARE_PROVIDER_SITE_OTHER): Payer: BLUE CROSS/BLUE SHIELD | Admitting: *Deleted

## 2018-02-02 DIAGNOSIS — E538 Deficiency of other specified B group vitamins: Secondary | ICD-10-CM | POA: Diagnosis not present

## 2018-02-02 DIAGNOSIS — Z23 Encounter for immunization: Secondary | ICD-10-CM | POA: Diagnosis not present

## 2018-02-02 MED ORDER — CYANOCOBALAMIN 1000 MCG/ML IJ SOLN
1000.0000 ug | Freq: Once | INTRAMUSCULAR | Status: AC
  Administered 2018-02-02: 1000 ug via INTRAMUSCULAR

## 2018-02-02 NOTE — Progress Notes (Signed)
b12 and flu Injection given.   Binnie Rail, MD

## 2018-02-05 ENCOUNTER — Other Ambulatory Visit: Payer: Self-pay | Admitting: Internal Medicine

## 2018-02-05 DIAGNOSIS — Z1231 Encounter for screening mammogram for malignant neoplasm of breast: Secondary | ICD-10-CM

## 2018-02-07 ENCOUNTER — Encounter (INDEPENDENT_AMBULATORY_CARE_PROVIDER_SITE_OTHER): Payer: Self-pay

## 2018-02-14 ENCOUNTER — Encounter (INDEPENDENT_AMBULATORY_CARE_PROVIDER_SITE_OTHER): Payer: Self-pay | Admitting: Family Medicine

## 2018-02-14 ENCOUNTER — Ambulatory Visit (INDEPENDENT_AMBULATORY_CARE_PROVIDER_SITE_OTHER): Payer: BLUE CROSS/BLUE SHIELD | Admitting: Family Medicine

## 2018-02-14 VITALS — BP 136/83 | HR 63 | Temp 98.3°F | Ht 65.0 in | Wt 368.0 lb

## 2018-02-14 DIAGNOSIS — Z6841 Body Mass Index (BMI) 40.0 and over, adult: Secondary | ICD-10-CM

## 2018-02-14 DIAGNOSIS — F322 Major depressive disorder, single episode, severe without psychotic features: Secondary | ICD-10-CM

## 2018-02-14 DIAGNOSIS — R7303 Prediabetes: Secondary | ICD-10-CM

## 2018-02-14 DIAGNOSIS — E559 Vitamin D deficiency, unspecified: Secondary | ICD-10-CM | POA: Diagnosis not present

## 2018-02-14 DIAGNOSIS — R0602 Shortness of breath: Secondary | ICD-10-CM

## 2018-02-14 DIAGNOSIS — Z9189 Other specified personal risk factors, not elsewhere classified: Secondary | ICD-10-CM

## 2018-02-14 DIAGNOSIS — Z0289 Encounter for other administrative examinations: Secondary | ICD-10-CM

## 2018-02-14 DIAGNOSIS — R5383 Other fatigue: Secondary | ICD-10-CM

## 2018-02-14 NOTE — Progress Notes (Signed)
Office: [phone]  /  Fax: [phone]   Dear Dr. Quay Burow,   Thank you for referring [name] to our clinic. The following note includes my evaluation and treatment recommendations.  HPI:   Chief Complaint: OBESITY    [name] has been referred by Binnie Rail, MD for consultation regarding her obesity and obesity related comorbidities.    [name] (MR# [id number]) is a 47 y.o. female who presents on 02/14/2018 for obesity evaluation and treatment. Current BMI is Body mass index is 61.24 kg/m.[name] [name] has been struggling with her weight for many years and has been unsuccessful in either losing weight, maintaining weight loss, or reaching her healthy weight goal.     [name] was referred by Dr. Quay Burow, but she initially heard about our clinic from a friend. [name] attended our information session and states she is currently in the action stage of change and ready to dedicate time achieving and maintaining a healthier weight. [name] is interested in becoming our patient and working on intensive lifestyle modifications including (but not limited to) diet, exercise and weight loss.    [name] states she thinks her family will eat healthier with  her she struggles with family and or coworkers weight loss sabotage her desired weight loss is 168 lbs she started gaining weight 2 yrs ago her heaviest weight ever was 368 lbs. she has significant food cravings issues  she snacks frequently in the evenings she skips meals frequently she is frequently drinking liquids with calories she frequently makes poor food choices she has problems with excessive hunger  she frequently eats larger portions than normal  she has binge eating behaviors she struggles with emotional eating    Fatigue [name] feels her energy is lower than it should be. This has worsened with weight gain and has not worsened recently. [name] admits to daytime somnolence and she admits to waking up still tired. Patient is at risk  for obstructive sleep apnea. Patent has a history of symptoms of daytime fatigue and morning fatigue. Patient generally gets 8 or more hours of sleep per night, and states they generally have restful sleep. Snoring is present. Apneic episodes are present without CPAP use. Epworth Sleepiness Score is 7  EKG was ordered today which shows normal sinus rhythm with poor R wave progression.  Dyspnea on exertion [name] notes increasing shortness of breath with exercising and seems to be worsening over time with weight gain. She notes getting out of breath sooner with activity than she used to. This has not gotten worse recently. [name] admits orthopnea. EKG was ordered today which shows normal sinus rhythm with poor R wave progression.  Pre-Diabetes [name] has a diagnosis of prediabetes based on her elevated Hgb A1c and was informed this puts her at greater risk of developing diabetes. She just started taking metformin. There is no repeat Hgb A1c since being placed on Metformin. She is attempting to work on diet and exercise to decrease risk of diabetes. She denies hypoglycemia.  At risk for diabetes [name] is at higher than average risk for developing diabetes due to her obesity and pre-diabetes. She currently denies polyuria or polydipsia.  Vitamin D deficiency [name] has a diagnosis of vitamin D deficiency. She is currently taking OTC vit D. [name] admits to fatigue and denies nausea, vomiting or muscle weakness.  Severe Depression with emotional eating behaviors [name] is struggling with emotional eating and using food for comfort to the extent that it is negatively impacting her  health. She often snacks when she is not hungry. [name] feels she is out of control and then feels guilty that she made poor food choices. She has been doing lots of comfort eating with her son taking a job as a Engineer, structural. She admits to anxiety. She shows no sign of suicidal or homicidal ideations.  Depression screen [name] 2/9  02/14/2018 05/19/2017 08/19/2015  Decreased Interest 3 0 0  Down, Depressed, Hopeless 2 0 0  PHQ - 2 Score 5 0 0  Altered sleeping 2 - -  Tired, decreased energy 3 - -  Change in appetite 2 - -  Feeling bad or failure about yourself  2 - -  Trouble concentrating 1 - -  Moving slowly or fidgety/restless 2 - -  Suicidal thoughts 1 - -  PHQ-9 Score 18 - -  Difficult doing work/chores Somewhat difficult - -      Depression Screen [name] Food and Mood (modified PHQ-9) score was  Depression screen PHQ 2/9 02/14/2018  Decreased Interest 3  Down, Depressed, Hopeless 2  PHQ - 2 Score 5  Altered sleeping 2  Tired, decreased energy 3  Change in appetite 2  Feeling bad or failure about yourself  2  Trouble concentrating 1  Moving slowly or fidgety/restless 2  Suicidal thoughts 1  PHQ-9 Score 18  Difficult doing work/chores Somewhat difficult    ALLERGIES: No Known Allergies  MEDICATIONS: Current Outpatient Medications on File Prior to Visit  Medication Sig Dispense Refill  . ALPRAZolam (XANAX) 0.5 MG tablet Take 1 tablet (0.5 mg total) by mouth 3 times/day as needed-between meals & bedtime for anxiety. 30 tablet 0  . Cholecalciferol (VITAMIN D) 2000 units tablet Take 4,000 Units by mouth daily.     . Cyanocobalamin (B-12 COMPLIANCE INJECTION) 1000 MCG/ML KIT Inject 1,000 mcg as directed every 30 (thirty) days.    [name] losartan (COZAAR) 50 MG tablet Take 1 tablet (50 mg total) by mouth daily. 30 tablet 11  . metFORMIN (GLUCOPHAGE) 500 MG tablet Take 1 tablet (500 mg total) by mouth 2 (two) times daily with a meal. 180 tablet 3  . metoprolol tartrate (LOPRESSOR) 25 MG tablet Take 12.5 mg by mouth 2 (two) times daily.    . pantoprazole (PROTONIX) 40 MG tablet Take 40 mg by mouth daily.    [name] PARoxetine (PAXIL) 20 MG tablet TAKE 1 TABLET BY MOUTH EVERY DAY 90 tablet 1  . UNABLE TO FIND Med Name: CPAP    . valACYclovir (VALTREX) 1000 MG tablet TAKE 2 TABLETS EVERY 12 HOURS FOR 1 DAY START ASAP  AFTER SYTPTOM ONSET 6 tablet 2   No current facility-administered medications on file prior to visit.     PAST MEDICAL HISTORY: Past Medical History:  Diagnosis Date  . Anxiety   . ASTHMA   . B12 deficiency   . DELAYED GASTRIC EMPTYING   . DEPRESSION   . GERD   . Glucose intolerance (impaired glucose tolerance) 04/09/2015  . Hypertension   . OBESITY   . OBSTRUCTIVE SLEEP APNEA   . POLYCYSTIC OVARIAN DISEASE   . Prediabetes   . SMOKER   . SOB (shortness of breath)   . SVT (supraventricular tachycardia) ([place])   . Tachycardia   . Umbilical hernia   . Umbilical hernia   . Vitamin B12 deficiency 03/2015 dx   start IM replacement q 30d  . Vitamin D deficiency 03/2015 dx    PAST SURGICAL HISTORY: Past Surgical History:  Procedure Laterality  Date  . CESAREAN SECTION  97 & 98   x's 2  . Cocxyl removal  1995  . DILATION AND CURETTAGE OF UTERUS    . LAPAROSCOPY    . TUBAL LIGATION  1998    SOCIAL HISTORY: Social History   Tobacco Use  . Smoking status: Former Smoker    Packs/day: 0.25    Years: 30.00    Pack years: 7.50    Types: Cigarettes    Start date: 10/23/1987    Last attempt to quit: 10/23/2017    Years since quitting: 0.3  . Smokeless tobacco: Never Used  Substance Use Topics  . Alcohol use: Yes    Alcohol/week: 0.0 standard drinks    Comment: Rarely  . Drug use: No    FAMILY HISTORY: Family History  Problem Relation Age of Onset  . Breast cancer Mother        dx 11/2009  . Diabetes Mother   . High blood pressure Mother   . Thyroid disease Mother   . Anxiety disorder Mother   . Obesity Mother   . Diabetes Maternal Aunt   . Prostate cancer Maternal Grandfather 49       also hx colon polyps  . Heart disease Paternal Grandmother   . Pancreatic cancer Paternal Grandmother 68  . High blood pressure Father   . High Cholesterol Father   . Depression Father   . Heart murmur Sister   . Adrenal disorder Neg Hx     ROS: Review of Systems    Constitutional: Positive for malaise/fatigue.  Eyes:       + Vision Changes + Wear Glasses or Contacts  Respiratory: Positive for shortness of breath.   Cardiovascular: Positive for palpitations and orthopnea.       + Shortness of Breath with Activity   Gastrointestinal: Positive for heartburn and nausea. Negative for vomiting.  Genitourinary: Negative for frequency.  Musculoskeletal:       + Muscle or Joint Pain Negative for muscle weakness  Endo/Heme/Allergies: Negative for polydipsia.       + Excessive Hunger Negative for hypoglycemia  Psychiatric/Behavioral: Positive for depression. The patient is nervous/anxious.     PHYSICAL EXAM: Blood pressure 136/83, pulse 63, temperature 98.3 F (36.8 C), temperature source Oral, height 5' 5"  (1.651 m), weight (!) 368 lb (166.9 kg), last menstrual period 01/16/2018, SpO2 100 %. Body mass index is 61.24 kg/m. Physical Exam  Constitutional: She is oriented to person, place, and time. She appears well-developed and well-nourished.  HENT:  Head: Normocephalic and atraumatic.  Nose: Nose normal.  Eyes: EOM are normal. No scleral icterus.  Neck: Normal range of motion. Neck supple. No thyromegaly present.  Cardiovascular: Normal rate and regular rhythm.  Pulmonary/Chest: Effort normal. No respiratory distress.  Abdominal: Soft. There is no tenderness.  + Obesity  Musculoskeletal: Normal range of motion.  Range of Motion normal in all 4 extremities  Neurological: She is alert and oriented to person, place, and time. Coordination normal.  Skin: Skin is warm and dry.  Psychiatric: She has a normal mood and affect.  Vitals reviewed.   RECENT LABS AND TESTS: BMET    Component Value Date/Time   NA 138 11/24/2017 1527   NA 138 02/18/2015   K 4.0 11/24/2017 1527   CL 102 11/24/2017 1527   CO2 30 11/24/2017 1527   GLUCOSE 93 11/24/2017 1527   BUN 14 11/24/2017 1527   BUN 10 02/18/2015   CREATININE 0.98 11/24/2017 1527   CALCIUM  9.6 11/24/2017 1527   GFRNONAA >60 09/23/2016 0301   GFRAA >60 09/23/2016 0301   Lab Results  Component Value Date   HGBA1C 6.1 11/24/2017   No results found for: INSULIN CBC    Component Value Date/Time   WBC 7.7 05/19/2017 1202   RBC 4.18 05/19/2017 1202   HGB 12.4 05/19/2017 1202   HCT 36.6 05/19/2017 1202   PLT 386 05/19/2017 1202   MCV 87.6 05/19/2017 1202   MCH 29.7 05/19/2017 1202   MCHC 33.9 05/19/2017 1202   RDW 12.9 05/19/2017 1202   LYMPHSABS 2,225 05/19/2017 1202   MONOABS 0.8 09/23/2016 0301   EOSABS 123 05/19/2017 1202   BASOSABS 31 05/19/2017 1202   Iron/TIBC/Ferritin/ %Sat No results found for: IRON, TIBC, FERRITIN, IRONPCTSAT Lipid Panel     Component Value Date/Time   CHOL 201 (H) 11/24/2017 1527   TRIG 117.0 11/24/2017 1527   HDL 45.30 11/24/2017 1527   CHOLHDL 4 11/24/2017 1527   VLDL 23.4 11/24/2017 1527   LDLCALC 133 (H) 11/24/2017 1527   LDLDIRECT 154.1 08/16/2012 0828   Hepatic Function Panel     Component Value Date/Time   PROT 7.8 11/24/2017 1527   ALBUMIN 4.1 11/24/2017 1527   AST 6 11/24/2017 1527   ALT 12 11/24/2017 1527   ALKPHOS 80 11/24/2017 1527   BILITOT 0.4 11/24/2017 1527   BILIDIR 0.0 12/12/2012 1144      Component Value Date/Time   TSH 2.94 10/27/2017 1353   TSH 1.80 05/19/2017 1011   TSH 2.53 05/25/2016 1121    ECG  shows NSR with a rate of 66 BPM INDIRECT CALORIMETER done today shows a VO2 of 266 and a REE of 1849.  Her calculated basal metabolic rate is 1610 thus her basal metabolic rate is worse than expected.    ASSESSMENT AND PLAN: Other fatigue - Plan: EKG 12-Lead, Vitamin B12, Folate, T3, T4, free, TSH  Shortness of breath on exertion  Prediabetes - Plan: Comprehensive metabolic panel, Hemoglobin A1c, Insulin, random  Vitamin D deficiency  Severe depression ([place]) - with emotional eating  At risk for diabetes mellitus  Class 3 severe obesity with serious comorbidity and body mass index (BMI) of  60.0 to 69.9 in adult, unspecified obesity type ([place])  PLAN: Fatigue Jeannelle was informed that her fatigue may be related to obesity, depression or many other causes. Labs will be ordered, and in the meanwhile Tess has agreed to work on diet, exercise and weight loss to help with fatigue. Proper sleep hygiene was discussed including the need for 7-8 hours of quality sleep each night. A sleep study was not ordered based on symptoms and Epworth score. We will order indirect calorimetry and EKG today.  Dyspnea on exertion Noriah's shortness of breath appears to be obesity related and exercise induced. She has agreed to work on weight loss and gradually increase exercise to treat her exercise induced shortness of breath. If Ayline follows our instructions and loses weight without improvement of her shortness of breath, we will plan to refer to pulmonology. We will order indirect calorimetry, EKG and labs today. We will monitor this condition regularly. Avya agrees to this plan.  Pre-Diabetes Marleigh will continue to work on weight loss, exercise, and decreasing simple carbohydrates in her diet to help decrease the risk of diabetes. She was informed that eating too many simple carbohydrates or too many calories at one sitting increases the likelihood of GI side effects. Dazaria will continue to take Metformin. We will check  Hgb A1c and insulin level today and Glory agreed to follow up with [place] as directed to monitor her progress.  Diabetes risk counseling Marchella was given extended (15 minutes) diabetes prevention counseling today. She is 47 y.o. female and has risk factors for diabetes including obesity and prediabetes. We discussed intensive lifestyle modifications today with an emphasis on weight loss as well as increasing exercise and decreasing simple carbohydrates in her diet.  Vitamin D Deficiency Makaylee was informed that low vitamin D levels contributes to fatigue and are associated with obesity, breast, and colon  cancer. She will continue to take OTC vitamin D and will follow up for routine testing of vitamin D, at least 2-3 times per year. She was informed of the risk of over-replacement of vitamin D and agrees to not increase her dose unless she discusses this with [place] first. We will check vitamin D level today and Jadence will follow up as directed.  Severe Depression with Emotional Eating Behaviors We discussed behavior modification techniques today to help Roya deal with her emotional eating and depression. We will refer to Dr. Mallie Mussel our bariatric psychologist.  Depression Screen Tallie had a strongly positive depression screening. Depression is commonly associated with obesity and often results in emotional eating behaviors. We will monitor this closely and work on CBT to help improve the non-hunger eating patterns.   Obesity Rashell is currently in the action stage of change and her goal is to continue with weight loss efforts. I recommend Brittanee begin the structured treatment plan as follows:  She has agreed to follow the Category 3 plan Catherina has been instructed to eventually work up to a goal of 150 minutes of combined cardio and strengthening exercise per week for weight loss and overall health benefits. We discussed the following Behavioral Modification Strategies today: no skipping meals, planing for success, increasing lean protein intake, increasing vegetables and work on meal planning and easy cooking plans   She was informed of the importance of frequent follow up visits to maximize her success with intensive lifestyle modifications for her multiple health conditions. She was informed we would discuss her lab results at her next visit unless there is a critical issue that needs to be addressed sooner. Dynastie agreed to keep her next visit at the agreed upon time to discuss these results.    OBESITY BEHAVIORAL INTERVENTION VISIT  Today's visit was # 1   Starting weight: 368 lbs Starting date:  02/14/18 Today's weight : 368 lbs  Today's date: 02/14/2018 Total lbs lost to date: 0   ASK: We discussed the diagnosis of obesity with [name] today and Colisha agreed to give [place] permission to discuss obesity behavioral modification therapy today.  ASSESS: Lavonda has the diagnosis of obesity and her BMI today is 61.24 Hassie is in the action stage of change   ADVISE: Rayyan was educated on the multiple health risks of obesity as well as the benefit of weight loss to improve her health. She was advised of the need for long term treatment and the importance of lifestyle modifications to improve her current health and to decrease her risk of future health problems.  AGREE: Multiple dietary modification options and treatment options were discussed and  Aala agreed to follow the recommendations documented in the above note.  ARRANGE: Tiziana was educated on the importance of frequent visits to treat obesity as outlined per CMS and USPSTF guidelines and agreed to schedule her next follow up appointment today.  Burnell Blanks  Valere Dross, am acting as Location manager for Eber Jones, MD  I have reviewed the above documentation for accuracy and completeness, and I agree with the above. - Ilene Qua, MD

## 2018-02-15 LAB — COMPREHENSIVE METABOLIC PANEL
ALT: 12 IU/L (ref 0–32)
AST: 6 IU/L (ref 0–40)
Albumin/Globulin Ratio: 1.3 (ref 1.2–2.2)
Albumin: 4.1 g/dL (ref 3.5–5.5)
Alkaline Phosphatase: 85 IU/L (ref 39–117)
BUN/Creatinine Ratio: 13 (ref 9–23)
BUN: 12 mg/dL (ref 6–24)
Bilirubin Total: 0.3 mg/dL (ref 0.0–1.2)
CO2: 19 mmol/L — ABNORMAL LOW (ref 20–29)
Calcium: 8.9 mg/dL (ref 8.7–10.2)
Chloride: 102 mmol/L (ref 96–106)
Creatinine, Ser: 0.9 mg/dL (ref 0.57–1.00)
GFR calc Af Amer: 89 mL/min/{1.73_m2} (ref >59)
GFR calc non Af Amer: 77 mL/min/{1.73_m2} (ref >59)
Globulin, Total: 3.1 g/dL (ref 1.5–4.5)
Glucose: 82 mg/dL (ref 65–99)
Potassium: 4.7 mmol/L (ref 3.5–5.2)
Sodium: 139 mmol/L (ref 134–144)
Total Protein: 7.2 g/dL (ref 6.0–8.5)

## 2018-02-15 LAB — VITAMIN B12: Vitamin B-12: 813 pg/mL (ref 232–1245)

## 2018-02-15 LAB — TSH: TSH: 2.48 u[IU]/mL (ref 0.450–4.500)

## 2018-02-15 LAB — HEMOGLOBIN A1C
Est. average glucose Bld gHb Est-mCnc: 117 mg/dL
Hgb A1c MFr Bld: 5.7 % — ABNORMAL HIGH (ref 4.8–5.6)

## 2018-02-15 LAB — FOLATE: Folate: 8.2 ng/mL (ref >3.0)

## 2018-02-15 LAB — T3: T3, Total: 114 ng/dL (ref 71–180)

## 2018-02-15 LAB — INSULIN, RANDOM: INSULIN: 19.1 u[IU]/mL (ref 2.6–24.9)

## 2018-02-15 LAB — T4, FREE: Free T4: 0.93 ng/dL (ref 0.82–1.77)

## 2018-02-18 ENCOUNTER — Encounter (INDEPENDENT_AMBULATORY_CARE_PROVIDER_SITE_OTHER): Payer: Self-pay | Admitting: Family Medicine

## 2018-02-27 ENCOUNTER — Other Ambulatory Visit: Payer: Self-pay | Admitting: Internal Medicine

## 2018-02-28 ENCOUNTER — Ambulatory Visit (INDEPENDENT_AMBULATORY_CARE_PROVIDER_SITE_OTHER): Payer: BLUE CROSS/BLUE SHIELD | Admitting: Family Medicine

## 2018-02-28 ENCOUNTER — Ambulatory Visit (INDEPENDENT_AMBULATORY_CARE_PROVIDER_SITE_OTHER): Payer: BLUE CROSS/BLUE SHIELD | Admitting: Psychology

## 2018-02-28 VITALS — BP 110/61 | HR 74 | Temp 98.1°F | Ht 65.0 in | Wt 354.0 lb

## 2018-02-28 DIAGNOSIS — Z9189 Other specified personal risk factors, not elsewhere classified: Secondary | ICD-10-CM | POA: Diagnosis not present

## 2018-02-28 DIAGNOSIS — R7303 Prediabetes: Secondary | ICD-10-CM

## 2018-02-28 DIAGNOSIS — E559 Vitamin D deficiency, unspecified: Secondary | ICD-10-CM

## 2018-02-28 DIAGNOSIS — Z6841 Body Mass Index (BMI) 40.0 and over, adult: Secondary | ICD-10-CM

## 2018-02-28 MED ORDER — VITAMIN D (ERGOCALCIFEROL) 1.25 MG (50000 UNIT) PO CAPS: 50000.0000 [IU] | ORAL_CAPSULE | ORAL | 0 refills | Status: DC

## 2018-03-01 ENCOUNTER — Other Ambulatory Visit: Payer: Self-pay | Admitting: Internal Medicine

## 2018-03-05 ENCOUNTER — Ambulatory Visit: Payer: BLUE CROSS/BLUE SHIELD | Admitting: Obstetrics and Gynecology

## 2018-03-05 NOTE — Progress Notes (Signed)
Office: [phone]  /  Fax: [phone]   HPI:   Chief Complaint: OBESITY [name] is here to discuss her progress with her obesity treatment plan. She is on the  follow the Category 3 plan and is following her eating plan approximately 95 % of the time. She states she is exercising 0 minutes 0 times per week. [name] did not like the yogurt and tried all different kinds. She denies hunger and has increased energy.  Her weight is (!) 354 lb (160.6 kg) today and has had a weight loss of 14 pounds over a period of 2 weeks since her last visit. She has lost 14 lbs since starting treatment with [place].  Vitamin D deficiency [name] has a diagnosis of vitamin D deficiency. She is currently taking OTC vit D 4K IU daily and denies nausea, vomiting or muscle weakness.  Ref. Range 11/24/2017 15:27  VITD Latest Ref Range: 30.00 - 100.00 ng/mL 46.07   Pre-Diabetes [name] has a diagnosis of prediabetes based on her elevated HgA1c and was informed this puts her at greater risk of developing diabetes. She is taking B12 injections daily and  metformin 500 mg BID currently and continues to work on diet and exercise to decrease risk of diabetes. She denies nausea or hypoglycemia.  At risk for diabetes [name] is at higher than averagerisk for developing diabetes due to her obesity. She currently denies polyuria or polydipsia.  ALLERGIES: No Known Allergies  MEDICATIONS: Current Outpatient Medications on File Prior to Visit  Medication Sig Dispense Refill  . ALPRAZolam (XANAX) 0.5 MG tablet Take 1 tablet (0.5 mg total) by mouth 3 times/day as needed-between meals & bedtime for anxiety. 30 tablet 0  . Cyanocobalamin (B-12 COMPLIANCE INJECTION) 1000 MCG/ML KIT Inject 1,000 mcg as directed every 30 (thirty) days.    [name] losartan (COZAAR) 50 MG tablet Take 1 tablet (50 mg total) by mouth daily. 30 tablet 11  . metFORMIN (GLUCOPHAGE) 500 MG tablet Take 1 tablet (500 mg total) by mouth 2 (two) times daily with a meal. 180  tablet 3  . metoprolol tartrate (LOPRESSOR) 25 MG tablet Take 12.5 mg by mouth 2 (two) times daily.    . pantoprazole (PROTONIX) 40 MG tablet Take 40 mg by mouth daily.    [name] PARoxetine (PAXIL) 20 MG tablet TAKE 1 TABLET BY MOUTH EVERY DAY 90 tablet 1  . UNABLE TO FIND Med Name: CPAP    . valACYclovir (VALTREX) 1000 MG tablet TAKE 2 TABLETS EVERY 12 HOURS FOR 1 DAY START ASAP AFTER SYTPTOM ONSET 6 tablet 2   No current facility-administered medications on file prior to visit.     PAST MEDICAL HISTORY: Past Medical History:  Diagnosis Date  . Anxiety   . ASTHMA   . B12 deficiency   . DELAYED GASTRIC EMPTYING   . DEPRESSION   . GERD   . Glucose intolerance (impaired glucose tolerance) 04/09/2015  . Hypertension   . OBESITY   . OBSTRUCTIVE SLEEP APNEA   . POLYCYSTIC OVARIAN DISEASE   . Prediabetes   . SMOKER   . SOB (shortness of breath)   . SVT (supraventricular tachycardia) ([place])   . Tachycardia   . Umbilical hernia   . Umbilical hernia   . Vitamin B12 deficiency 03/2015 dx   start IM replacement q 30d  . Vitamin D deficiency 03/2015 dx    PAST SURGICAL HISTORY: Past Surgical History:  Procedure Laterality Date  . CESAREAN SECTION  97 & 98   x's  2  . Cocxyl removal  1995  . DILATION AND CURETTAGE OF UTERUS    . LAPAROSCOPY    . TUBAL LIGATION  1998    SOCIAL HISTORY: Social History   Tobacco Use  . Smoking status: Former Smoker    Packs/day: 0.25    Years: 30.00    Pack years: 7.50    Types: Cigarettes    Start date: 10/23/1987    Last attempt to quit: 10/23/2017    Years since quitting: 0.3  . Smokeless tobacco: Never Used  Substance Use Topics  . Alcohol use: Yes    Alcohol/week: 0.0 standard drinks    Comment: Rarely  . Drug use: No    FAMILY HISTORY: Family History  Problem Relation Age of Onset  . Breast cancer Mother        dx 11/2009  . Diabetes Mother   . High blood pressure Mother   . Thyroid disease Mother   . Anxiety disorder Mother     . Obesity Mother   . Diabetes Maternal Aunt   . Prostate cancer Maternal Grandfather 88       also hx colon polyps  . Heart disease Paternal Grandmother   . Pancreatic cancer Paternal Grandmother 68  . High blood pressure Father   . High Cholesterol Father   . Depression Father   . Heart murmur Sister   . Adrenal disorder Neg Hx     ROS: Review of Systems  Constitutional: Positive for weight loss.  Gastrointestinal: Negative for nausea and vomiting.  Musculoskeletal:       Negative for muscle weakness  Endo/Heme/Allergies:       Negative for polyuria Negative for hypoglycemia    PHYSICAL EXAM: Blood pressure 110/61, pulse 74, temperature 98.1 F (36.7 C), temperature source Oral, height 5' 5"  (1.651 m), weight (!) 354 lb (160.6 kg), SpO2 97 %. Body mass index is 58.91 kg/m. Physical Exam  Constitutional: She is oriented to person, place, and time. She appears well-developed and well-nourished.  HENT:  Head: Normocephalic.  Neck: Normal range of motion.  Cardiovascular: Normal rate.  Pulmonary/Chest: Effort normal.  Musculoskeletal: Normal range of motion.  Neurological: She is alert and oriented to person, place, and time.  Skin: Skin is warm and dry.  Psychiatric: She has a normal mood and affect. Her behavior is normal.  Vitals reviewed.   RECENT LABS AND TESTS: BMET    Component Value Date/Time   NA 139 02/14/2018 1146   K 4.7 02/14/2018 1146   CL 102 02/14/2018 1146   CO2 19 (L) 02/14/2018 1146   GLUCOSE 82 02/14/2018 1146   GLUCOSE 93 11/24/2017 1527   BUN 12 02/14/2018 1146   CREATININE 0.90 02/14/2018 1146   CALCIUM 8.9 02/14/2018 1146   GFRNONAA 77 02/14/2018 1146   GFRAA 89 02/14/2018 1146   Lab Results  Component Value Date   HGBA1C 5.7 (H) 02/14/2018   HGBA1C 6.1 11/24/2017   HGBA1C 5.9 05/19/2017   HGBA1C 5.9 05/25/2016   HGBA1C 5.9 10/14/2015   Lab Results  Component Value Date   INSULIN 19.1 02/14/2018   CBC    Component Value  Date/Time   WBC 7.7 05/19/2017 1202   RBC 4.18 05/19/2017 1202   HGB 12.4 05/19/2017 1202   HCT 36.6 05/19/2017 1202   PLT 386 05/19/2017 1202   MCV 87.6 05/19/2017 1202   MCH 29.7 05/19/2017 1202   MCHC 33.9 05/19/2017 1202   RDW 12.9 05/19/2017 1202   LYMPHSABS 2,225  05/19/2017 1202   MONOABS 0.8 09/23/2016 0301   EOSABS 123 05/19/2017 1202   BASOSABS 31 05/19/2017 1202   Iron/TIBC/Ferritin/ %Sat No results found for: IRON, TIBC, FERRITIN, IRONPCTSAT Lipid Panel     Component Value Date/Time   CHOL 201 (H) 11/24/2017 1527   TRIG 117.0 11/24/2017 1527   HDL 45.30 11/24/2017 1527   CHOLHDL 4 11/24/2017 1527   VLDL 23.4 11/24/2017 1527   LDLCALC 133 (H) 11/24/2017 1527   LDLDIRECT 154.1 08/16/2012 0828   Hepatic Function Panel     Component Value Date/Time   PROT 7.2 02/14/2018 1146   ALBUMIN 4.1 02/14/2018 1146   AST 6 02/14/2018 1146   ALT 12 02/14/2018 1146   ALKPHOS 85 02/14/2018 1146   BILITOT 0.3 02/14/2018 1146   BILIDIR 0.0 12/12/2012 1144      Component Value Date/Time   TSH 2.480 02/14/2018 1146   TSH 2.94 10/27/2017 1353   TSH 1.80 05/19/2017 1011    Ref. Range 11/24/2017 15:27  VITD Latest Ref Range: 30.00 - 100.00 ng/mL 46.07    ASSESSMENT AND PLAN: Vitamin D deficiency - Plan: Vitamin D, Ergocalciferol, (DRISDOL) 1.25 MG (50000 UT) CAPS capsule  Prediabetes  At risk for diabetes mellitus  Class 3 severe obesity with serious comorbidity and body mass index (BMI) of 50.0 to 59.9 in adult, unspecified obesity type ([place])  PLAN: Vitamin D Deficiency Jaedyn was informed that low vitamin D levels contributes to fatigue and are associated with obesity, breast, and colon cancer. She agrees to discontinue OTC vit D and start to take prescription Vit D @50 ,000 IU every week #4 with no refills and will follow up for routine testing of vitamin D, at least 2-3 times per year. She was informed of the risk of over-replacement of vitamin D and agrees to not  increase her dose unless she discusses this with [place] first. Agrees to follow up with our clinic as directed.   Pre-Diabetes Leonor will continue to work on weight loss, exercise, and decreasing simple carbohydrates in her diet to help decrease the risk of diabetes. We dicussed metformin including benefits and risks. She was informed that eating too many simple carbohydrates or too many calories at one sitting increases the likelihood of GI side effects. Kysa agrees to continue metformin and vit b12 as prescribed. Laretha agreed to follow up with [place] as directed to monitor her progress.  Diabetes risk counseling Jeremie was given extended (30 minutes) diabetes prevention counseling today. She is 47 y.o. female and has risk factors for diabetes including obesity. We discussed intensive lifestyle modifications today with an emphasis on weight loss as well as increasing exercise and decreasing simple carbohydrates in her diet.  Obesity Yonna is currently in the action stage of change. As such, her goal is to continue with weight loss efforts She has agreed to follow the Category 3 plan  Discussed doing a microwave meal 350 calories +1-2 ounces of meat at lunch and 10 oz of meat at dinner.  Felicha has been instructed to work up to a goal of 150 minutes of combined cardio and strengthening exercise per week for weight loss and overall health benefits. We discussed the following Behavioral Modification Strategies today: increasing lean protein intake, increasing vegetables, work on meal planning and easy cooking plans and holiday eating strategies    Tykisha has agreed to follow up with our clinic in 2 weeks. She was informed of the importance of frequent follow up visits to maximize her success with  intensive lifestyle modifications for her multiple health conditions.   OBESITY BEHAVIORAL INTERVENTION VISIT  Today's visit was # 2   Starting weight: 368 lb Starting date: 02/14/18 Today's weight : Weight: (!) 354  lb (160.6 kg)  Today's date: 02/28/18 Total lbs lost to date: 14 lb At least 15 minutes were spent on discussing the following behavioral intervention visit.   ASK: We discussed the diagnosis of obesity with [name] today and Tamana agreed to give [place] permission to discuss obesity behavioral modification therapy today.  ASSESS: Tyaisha has the diagnosis of obesity and her BMI today is 61.24 Ieasha is in the action stage of change   ADVISE: Neftaly was educated on the multiple health risks of obesity as well as the benefit of weight loss to improve her health. She was advised of the need for long term treatment and the importance of lifestyle modifications to improve her current health and to decrease her risk of future health problems.  AGREE: Multiple dietary modification options and treatment options were discussed and  Tosca agreed to follow the recommendations documented in the above note.  ARRANGE: Kimbree was educated on the importance of frequent visits to treat obesity as outlined per CMS and USPSTF guidelines and agreed to schedule her next follow up appointment today.  I, Renee Ramus, am acting as transcriptionist for Ilene Qua, MD   I have reviewed the above documentation for accuracy and completeness, and I agree with the above. - Ilene Qua, MD

## 2018-03-07 ENCOUNTER — Ambulatory Visit (INDEPENDENT_AMBULATORY_CARE_PROVIDER_SITE_OTHER): Payer: BLUE CROSS/BLUE SHIELD | Admitting: Emergency Medicine

## 2018-03-07 DIAGNOSIS — E538 Deficiency of other specified B group vitamins: Secondary | ICD-10-CM | POA: Diagnosis not present

## 2018-03-07 MED ORDER — CYANOCOBALAMIN 1000 MCG/ML IJ SOLN
1000.0000 ug | Freq: Once | INTRAMUSCULAR | Status: AC
  Administered 2018-03-07: 1000 ug via INTRAMUSCULAR

## 2018-03-07 NOTE — Progress Notes (Signed)
b12 Injection given.   Binnie Rail, MD

## 2018-03-14 ENCOUNTER — Ambulatory Visit (INDEPENDENT_AMBULATORY_CARE_PROVIDER_SITE_OTHER): Payer: BLUE CROSS/BLUE SHIELD | Admitting: Family Medicine

## 2018-03-14 VITALS — BP 128/69 | HR 69 | Temp 97.6°F | Ht 65.0 in | Wt 354.0 lb

## 2018-03-14 DIAGNOSIS — E559 Vitamin D deficiency, unspecified: Secondary | ICD-10-CM

## 2018-03-14 DIAGNOSIS — R7303 Prediabetes: Secondary | ICD-10-CM

## 2018-03-14 DIAGNOSIS — Z9189 Other specified personal risk factors, not elsewhere classified: Secondary | ICD-10-CM | POA: Diagnosis not present

## 2018-03-14 DIAGNOSIS — Z6841 Body Mass Index (BMI) 40.0 and over, adult: Secondary | ICD-10-CM

## 2018-03-14 MED ORDER — VITAMIN D (ERGOCALCIFEROL) 1.25 MG (50000 UNIT) PO CAPS: 50000.0000 [IU] | ORAL_CAPSULE | ORAL | 0 refills | Status: DC

## 2018-03-16 ENCOUNTER — Ambulatory Visit
Admission: RE | Admit: 2018-03-16 | Discharge: 2018-03-16 | Disposition: A | Discharge status: Home / Self Care | Payer: BLUE CROSS/BLUE SHIELD | Source: Ambulatory Visit | Attending: Internal Medicine | Admitting: Internal Medicine

## 2018-03-16 DIAGNOSIS — Z1231 Encounter for screening mammogram for malignant neoplasm of breast: Secondary | ICD-10-CM

## 2018-03-16 NOTE — Progress Notes (Signed)
Office: [phone]  /  Fax: [phone]   HPI:   Chief Complaint: OBESITY [name] is here to discuss her progress with her obesity treatment plan. She is on the  follow the Category 3 plan and is following her eating plan approximately 70-80 % of the time. She states she is exercising 0 minutes 0 times per week. [name] is happy she maintained her weight over thanksgiving. She is back on track and struggling with large amount of meat on plan. Her weight is (!) 354 lb (160.6 kg) today and maintained her weight since her last visit. She has lost 14 lbs since starting treatment with [place].  Vitamin D deficiency [name] has a diagnosis of vitamin D deficiency. She is currently taking vit D and denies nausea, vomiting or muscle weakness. She is not yet at goal yet. Her last Vit D on 11/24/17 was 46.07. She is positive for fatigue but has improved.   Pre-Diabetes [name] has a diagnosis of prediabetes based on her elevated HgA1c and was informed this puts her at greater risk of developing diabetes. She is taking metformin currently and continues to work on diet and exercise to decrease risk of diabetes. She denies nausea or hypoglycemia. Lab Results  Component Value Date   HGBA1C 5.7 (H) 02/14/2018    At risk for osteopenia and osteoporosis [name] is at higher risk of osteopenia and osteoporosis due to vitamin D deficiency.    ALLERGIES: No Known Allergies  MEDICATIONS: Current Outpatient Medications on File Prior to Visit  Medication Sig Dispense Refill  . ALPRAZolam (XANAX) 0.5 MG tablet Take 1 tablet (0.5 mg total) by mouth 3 times/day as needed-between meals & bedtime for anxiety. 30 tablet 0  . Cyanocobalamin (B-12 COMPLIANCE INJECTION) 1000 MCG/ML KIT Inject 1,000 mcg as directed every 30 (thirty) days.    [name] losartan (COZAAR) 50 MG tablet Take 1 tablet (50 mg total) by mouth daily. 30 tablet 11  . metFORMIN (GLUCOPHAGE) 500 MG tablet Take 1 tablet (500 mg total) by mouth 2 (two) times daily  with a meal. 180 tablet 3  . metoprolol tartrate (LOPRESSOR) 25 MG tablet Take 12.5 mg by mouth 2 (two) times daily.    . pantoprazole (PROTONIX) 40 MG tablet Take 40 mg by mouth daily.    [name] PARoxetine (PAXIL) 20 MG tablet TAKE 1 TABLET BY MOUTH EVERY DAY 90 tablet 1  . UNABLE TO FIND Med Name: CPAP    . valACYclovir (VALTREX) 1000 MG tablet TAKE 2 TABLETS EVERY 12 HOURS FOR 1 DAY START ASAP AFTER SYTPTOM ONSET 6 tablet 2   No current facility-administered medications on file prior to visit.     PAST MEDICAL HISTORY: Past Medical History:  Diagnosis Date  . Anxiety   . ASTHMA   . B12 deficiency   . DELAYED GASTRIC EMPTYING   . DEPRESSION   . GERD   . Glucose intolerance (impaired glucose tolerance) 04/09/2015  . Hypertension   . OBESITY   . OBSTRUCTIVE SLEEP APNEA   . POLYCYSTIC OVARIAN DISEASE   . Prediabetes   . SMOKER   . SOB (shortness of breath)   . SVT (supraventricular tachycardia) ([place])   . Tachycardia   . Umbilical hernia   . Umbilical hernia   . Vitamin B12 deficiency 03/2015 dx   start IM replacement q 30d  . Vitamin D deficiency 03/2015 dx    PAST SURGICAL HISTORY: Past Surgical History:  Procedure Laterality Date  . CESAREAN SECTION  97 & 98  x's 2  . Cocxyl removal  1995  . DILATION AND CURETTAGE OF UTERUS    . LAPAROSCOPY    . TUBAL LIGATION  1998    SOCIAL HISTORY: Social History   Tobacco Use  . Smoking status: Former Smoker    Packs/day: 0.25    Years: 30.00    Pack years: 7.50    Types: Cigarettes    Start date: 10/23/1987    Last attempt to quit: 10/23/2017    Years since quitting: 0.3  . Smokeless tobacco: Never Used  Substance Use Topics  . Alcohol use: Yes    Alcohol/week: 0.0 standard drinks    Comment: Rarely  . Drug use: No    FAMILY HISTORY: Family History  Problem Relation Age of Onset  . Breast cancer Mother        dx 11/2009  . Diabetes Mother   . High blood pressure Mother   . Thyroid disease Mother   . Anxiety  disorder Mother   . Obesity Mother   . Diabetes Maternal Aunt   . Prostate cancer Maternal Grandfather 1       also hx colon polyps  . Heart disease Paternal Grandmother   . Pancreatic cancer Paternal Grandmother 36  . High blood pressure Father   . High Cholesterol Father   . Depression Father   . Heart murmur Sister   . Adrenal disorder Neg Hx     ROS: Review of Systems  Constitutional: Positive for malaise/fatigue.  Gastrointestinal: Negative for nausea and vomiting.  Neurological: Negative for weakness.  Endo/Heme/Allergies:       Negative for hypoglycemia.     PHYSICAL EXAM: Blood pressure 128/69, pulse 69, temperature 97.6 F (36.4 C), temperature source Oral, height _0  (1.651 m), weight (!) 354 lb (160.6 kg), SpO2 97 %. Body mass index is 58.91 kg/m. Physical Exam  Constitutional: She is oriented to person, place, and time. She appears well-developed.  HENT:  Head: Normocephalic.  Eyes: EOM are normal.  Neck: Normal range of motion.  Pulmonary/Chest: Effort normal.  Abdominal: Soft.  Musculoskeletal: Normal range of motion.  Neurological: She is alert and oriented to person, place, and time.  Skin: Skin is warm.  Psychiatric: She has a normal mood and affect. Her behavior is normal.  Vitals reviewed.   RECENT LABS AND TESTS: BMET    Component Value Date/Time   NA 139 02/14/2018 1146   K 4.7 02/14/2018 1146   CL 102 02/14/2018 1146   CO2 19 (L) 02/14/2018 1146   GLUCOSE 82 02/14/2018 1146   GLUCOSE 93 11/24/2017 1527   BUN 12 02/14/2018 1146   CREATININE 0.90 02/14/2018 1146   CALCIUM 8.9 02/14/2018 1146   GFRNONAA 77 02/14/2018 1146   GFRAA 89 02/14/2018 1146   Lab Results  Component Value Date   HGBA1C 5.7 (H) 02/14/2018   HGBA1C 6.1 11/24/2017   HGBA1C 5.9 05/19/2017   HGBA1C 5.9 05/25/2016   HGBA1C 5.9 10/14/2015   Lab Results  Component Value Date   INSULIN 19.1 02/14/2018   CBC    Component Value Date/Time   WBC 7.7 05/19/2017  1202   RBC 4.18 05/19/2017 1202   HGB 12.4 05/19/2017 1202   HCT 36.6 05/19/2017 1202   PLT 386 05/19/2017 1202   MCV 87.6 05/19/2017 1202   MCH 29.7 05/19/2017 1202   MCHC 33.9 05/19/2017 1202   RDW 12.9 05/19/2017 1202   LYMPHSABS 2,225 05/19/2017 1202   MONOABS 0.8 09/23/2016 0301  EOSABS 123 05/19/2017 1202   BASOSABS 31 05/19/2017 1202   Iron/TIBC/Ferritin/ %Sat No results found for: IRON, TIBC, FERRITIN, IRONPCTSAT Lipid Panel     Component Value Date/Time   CHOL 201 (H) 11/24/2017 1527   TRIG 117.0 11/24/2017 1527   HDL 45.30 11/24/2017 1527   CHOLHDL 4 11/24/2017 1527   VLDL 23.4 11/24/2017 1527   LDLCALC 133 (H) 11/24/2017 1527   LDLDIRECT 154.1 08/16/2012 0828   Hepatic Function Panel     Component Value Date/Time   PROT 7.2 02/14/2018 1146   ALBUMIN 4.1 02/14/2018 1146   AST 6 02/14/2018 1146   ALT 12 02/14/2018 1146   ALKPHOS 85 02/14/2018 1146   BILITOT 0.3 02/14/2018 1146   BILIDIR 0.0 12/12/2012 1144      Component Value Date/Time   TSH 2.480 02/14/2018 1146   TSH 2.94 10/27/2017 1353   TSH 1.80 05/19/2017 1011    ASSESSMENT AND PLAN: Vitamin D deficiency - Plan: Vitamin D, Ergocalciferol, (DRISDOL) 1.25 MG (50000 UT) CAPS capsule  Prediabetes  At risk for osteoporosis  Class 3 severe obesity with serious comorbidity and body mass index (BMI) of 50.0 to 59.9 in adult, unspecified obesity type ([place])  PLAN: Vitamin D Deficiency Saidy was informed that low vitamin D levels contributes to fatigue and are associated with obesity, breast, and colon cancer. She agrees to continue to take prescription Vit D _0 ,000 IU every week and will follow up for routine testing of vitamin D, at least 2-3 times per year. She was informed of the risk of over-replacement of vitamin D and agrees to not increase her dose unless she discusses this with [place] first.  Pre-Diabetes Ailin will continue to work on weight loss, exercise, and decreasing simple carbohydrates in  her diet to help decrease the risk of diabetes. Rosalyn Charters  will continue metformin but a prescription is not needed today,.  [place] agreed to follow up with [place] as directed to monitor her progress.  At risk for osteopenia and osteoporosis Heena was given extended  (15 minutes) osteoporosis prevention counseling today. Makahla is at risk for osteopenia and osteoporsis due to her vitamin D deficiency. She was encouraged to take her vitamin D and follow her higher calcium diet and increase strengthening exercise to help strengthen her bones and decrease her risk of osteopenia and osteoporosis.  Obesity Donnette is currently in the action stage of change. As such, her goal is to continue with weight loss efforts She has agreed to follow the Category 3 plan Vernestine has not been prescribed exercise at this time. We discussed the following Behavioral Modification Stratagies today: increasing lean protein intake and work on meal planning and easy cooking plans, better snacking choices and planning for success.    Khali has agreed to follow up with our clinic in 2 weeks. She was informed of the importance of frequent follow up visits to maximize her success with intensive lifestyle modifications for her multiple health conditions.   OBESITY BEHAVIORAL INTERVENTION VISIT  Today's visit was # 3   Starting weight: 368 lbs Starting date: 02/14/18 Today's weight : Weight: (!) 354 lb (160.6 kg)  Today's date: 03/15/18 Total lbs lost to date: 14   ASK: We discussed the diagnosis of obesity with [name] today and Lucy agreed to give [place] permission to discuss obesity behavioral modification therapy today.  ASSESS: Deem has the diagnosis of obesity and her BMI today is 58.9 Joua is in the action stage of change  ADVISE: Nema was educated on the multiple health risks of obesity as well as the benefit of weight loss to improve her health. She was advised of the need for long term treatment and the importance of  lifestyle modifications to improve her current health and to decrease her risk of future health problems.  AGREE: Multiple dietary modification options and treatment options were discussed and  Jasmynn agreed to follow the recommendations documented in the above note.  ARRANGE: Wania was educated on the importance of frequent visits to treat obesity as outlined per CMS and USPSTF guidelines and agreed to schedule her next follow up appointment today.  I, April Moore, am acting as Location manager for [employer], [employer].  I have reviewed the above documentation for accuracy and completeness, and I agree with the above.  - Hailie Searight, FNP-C.

## 2018-03-19 ENCOUNTER — Ambulatory Visit (INDEPENDENT_AMBULATORY_CARE_PROVIDER_SITE_OTHER): Payer: BLUE CROSS/BLUE SHIELD | Admitting: Obstetrics and Gynecology

## 2018-03-19 ENCOUNTER — Encounter: Payer: Self-pay | Admitting: Obstetrics and Gynecology

## 2018-03-19 ENCOUNTER — Other Ambulatory Visit: Payer: Self-pay

## 2018-03-19 ENCOUNTER — Encounter (INDEPENDENT_AMBULATORY_CARE_PROVIDER_SITE_OTHER): Payer: Self-pay | Admitting: Family Medicine

## 2018-03-19 VITALS — BP 143/81 | HR 68 | Temp 98.0°F | Ht 65.0 in | Wt 356.0 lb

## 2018-03-19 DIAGNOSIS — Z124 Encounter for screening for malignant neoplasm of cervix: Secondary | ICD-10-CM | POA: Diagnosis not present

## 2018-03-19 DIAGNOSIS — Z01419 Encounter for gynecological examination (general) (routine) without abnormal findings: Secondary | ICD-10-CM | POA: Diagnosis not present

## 2018-03-19 DIAGNOSIS — Z1151 Encounter for screening for human papillomavirus (HPV): Secondary | ICD-10-CM | POA: Diagnosis not present

## 2018-03-19 NOTE — Progress Notes (Signed)
Subjective:     [name] is a 47 y.o. female P2 with BMI 59 and LMP 02/15/2018 who is here for a comprehensive physical exam. The patient reports no problems. She is sexually active with her husband using BTL for contraception. She reports a monthly period lasting 4 days. She reports intermittent RLQ pain for the past month. She denies any pain today. She denies any pelvic pain or abnormal discharge. Patient had mammogram 12/6.   Past Medical History:  Diagnosis Date  . Anxiety   . ASTHMA   . B12 deficiency   . DELAYED GASTRIC EMPTYING   . DEPRESSION   . GERD   . Glucose intolerance (impaired glucose tolerance) 04/09/2015  . Hypertension   . OBESITY   . OBSTRUCTIVE SLEEP APNEA   . POLYCYSTIC OVARIAN DISEASE   . Prediabetes   . SMOKER   . SOB (shortness of breath)   . SVT (supraventricular tachycardia) ([place])   . Tachycardia   . Umbilical hernia   . Umbilical hernia   . Vitamin B12 deficiency 03/2015 dx   start IM replacement q 30d  . Vitamin D deficiency 03/2015 dx   Past Surgical History:  Procedure Laterality Date  . CESAREAN SECTION  97 & 98   x's 2  . Cocxyl removal  1995  . DILATION AND CURETTAGE OF UTERUS    . LAPAROSCOPY    . TUBAL LIGATION  1998   Family History  Problem Relation Age of Onset  . Breast cancer Mother        dx 11/2009  . Diabetes Mother   . High blood pressure Mother   . Thyroid disease Mother   . Anxiety disorder Mother   . Obesity Mother   . Diabetes Maternal Aunt   . Prostate cancer Maternal Grandfather 72       also hx colon polyps  . Heart disease Paternal Grandmother   . Pancreatic cancer Paternal Grandmother 48  . High blood pressure Father   . High Cholesterol Father   . Depression Father   . Heart murmur Sister   . Adrenal disorder Neg Hx     Social History   Socioeconomic History  . Marital status: Married    Spouse name: Coleen Cardiff  . Number of children: 2  . Years of education: Not on file  . Highest education  level: Not on file  Occupational History  . 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 Financial resource strain: Not on file  . Food insecurity:    Worry: Not on file    Inability: Not on file  . Transportation needs:    Medical: Not on file    Non-medical: Not on file  Tobacco Use  . Smoking status: Former Smoker    Packs/day: 0.25    Years: 30.00    Pack years: 7.50    Types: Cigarettes    Start date: 10/23/1987    Last attempt to quit: 10/23/2017    Years since quitting: 0.4  . Smokeless tobacco: Never Used  Substance and Sexual Activity  . Alcohol use: Yes    Alcohol/week: 0.0 standard drinks    Comment: Rarely  . Drug use: No  . Sexual activity: Not on file  Lifestyle  . Physical activity:    Days per week: Not on file    Minutes per session: Not on file  . Stress: Not on file  Relationships  .  Social connections:    Talks on phone: Not on file    Gets together: Not on file    Attends religious service: Not on file    Active member of club or organization: Not on file    Attends meetings of clubs or organizations: Not on file    Relationship status: Not on file  . Intimate partner violence:    Fear of current or ex partner: Not on file    Emotionally abused: Not on file    Physically abused: Not on file    Forced sexual activity: Not on file  Other Topics Concern  . Not on file  Social History Narrative   Lives with husband & 2 kids from 1st marriage. (1st husband expired 1998-leukemia) work as Merchandiser, retail, but currently unemployed   Health Maintenance  Topic Date Due  . MAMMOGRAM  12/26/2011  . PAP SMEAR  02/17/2018  . TETANUS/TDAP  05/20/2027  . INFLUENZA VACCINE  Completed  . HIV Screening  Completed       Review of Systems Pertinent items are noted in HPI.   Objective:  Blood pressure (!) 143/81, pulse 68, temperature 98 F (36.7 C), height 5' 5"  (1.651 m), weight (!) 356 lb (161.5 kg),  last menstrual period 02/15/2018.     GENERAL: Well-developed, well-nourished female in no acute distress. 0.5 cm raised grey mole noticed over the right scapula.  HEENT: Normocephalic, atraumatic. Sclerae anicteric.  NECK: Supple. Normal thyroid.  LUNGS: Clear to auscultation bilaterally.  HEART: Regular rate and rhythm. BREASTS: Symmetric in size. No palpable masses or lymphadenopathy, skin changes, or nipple drainage. ABDOMEN: Soft, nontender, nondistended. No organomegaly. 3 cm umbilical hernia noted PELVIC: Normal external female genitalia. Vagina is pink and rugated.  Normal discharge. Normal appearing cervix. Bimanual exam limited secondary to body habitus. No adnexal mass or tenderness. EXTREMITIES: No cyanosis, clubbing, or edema, 2+ distal pulses.    Assessment:    Healthy female exam.      Plan:     pap smear collected Pelvic ultrasound ordered to evaluate RLQ pain Patient to follow up with PCP as scheduled in January Patient scheduled to see dermatology in February for mole evaluation Patient will be contacted with abnormal results RTC prn See After Visit Summary for Counseling Recommendations

## 2018-03-19 NOTE — Progress Notes (Signed)
Presents for AEX/PAP.  Last mammogram 03/16/18.  C/o hernia.

## 2018-03-22 LAB — CYTOLOGY - PAP
Diagnosis: NEGATIVE
HPV: NOT DETECTED

## 2018-03-23 ENCOUNTER — Ambulatory Visit (HOSPITAL_COMMUNITY): Payer: BLUE CROSS/BLUE SHIELD | Attending: Obstetrics and Gynecology

## 2018-03-28 ENCOUNTER — Ambulatory Visit (INDEPENDENT_AMBULATORY_CARE_PROVIDER_SITE_OTHER): Payer: BLUE CROSS/BLUE SHIELD | Admitting: Family Medicine

## 2018-03-28 ENCOUNTER — Encounter (INDEPENDENT_AMBULATORY_CARE_PROVIDER_SITE_OTHER): Payer: Self-pay | Admitting: Family Medicine

## 2018-03-28 VITALS — BP 112/61 | HR 61 | Temp 97.0°F | Ht 65.0 in | Wt 349.0 lb

## 2018-03-28 DIAGNOSIS — R7303 Prediabetes: Secondary | ICD-10-CM

## 2018-03-28 DIAGNOSIS — E559 Vitamin D deficiency, unspecified: Secondary | ICD-10-CM | POA: Diagnosis not present

## 2018-03-28 DIAGNOSIS — F3289 Other specified depressive episodes: Secondary | ICD-10-CM

## 2018-03-28 DIAGNOSIS — Z6841 Body Mass Index (BMI) 40.0 and over, adult: Secondary | ICD-10-CM

## 2018-03-28 DIAGNOSIS — Z9189 Other specified personal risk factors, not elsewhere classified: Secondary | ICD-10-CM

## 2018-03-28 MED ORDER — TOPIRAMATE 50 MG PO TABS: 50.0000 mg | ORAL_TABLET | Freq: Every day | ORAL | 0 refills | Status: DC

## 2018-03-28 MED ORDER — VITAMIN D (ERGOCALCIFEROL) 1.25 MG (50000 UNIT) PO CAPS: 50000.0000 [IU] | ORAL_CAPSULE | ORAL | 0 refills | Status: DC

## 2018-03-29 ENCOUNTER — Encounter (INDEPENDENT_AMBULATORY_CARE_PROVIDER_SITE_OTHER): Payer: Self-pay | Admitting: Family Medicine

## 2018-03-29 NOTE — Progress Notes (Signed)
Office: [phone]  /  Fax: [phone]   HPI:   Chief Complaint: OBESITY [name] is here to discuss her progress with her obesity treatment plan. She is on the Category 3 plan and is following her eating plan approximately 75 % of the time. She states she is exercising 0 minutes 0 times per week. [name] has "cheated" a few times.  Her weight is (!) 349 lb (158.3 kg) today and has had a weight loss of 5 pounds over a period of 2 weeks since her last visit. She has lost 19 lbs since starting treatment with [place].  Vitamin D deficiency [name] has a diagnosis of vitamin D deficiency. She is currently taking vit D and is not at goal. She denies nausea, vomiting or muscle weakness.  Pre-Diabetes [name] has a diagnosis of pre-diabetes based on her elevated Hgb A1c and was informed this puts her at greater risk of developing diabetes. She is taking metformin currently and denies nausea, vomiting, or diarrhea. She continues to work on diet and exercise to decrease risk of diabetes. She denies hypoglycemia. Lab Results  Component Value Date   HGBA1C 5.7 (H) 02/14/2018    At risk for diabetes [name] is at higher than averagerisk for developing diabetes due to her pre-diabetes and obesity. She currently denies polyuria or polydipsia.  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 She  snacks when she is not hungry at night. [name] sometimes feels she is out of control and then feels guilty that she made poor food choices. She has been working on behavior modification techniques to help reduce her emotional eating and has been somewhat successful. [name] has cravings at night. She denies hunger at night.  ASSESSMENT AND PLAN:  Vitamin D deficiency - Plan: Vitamin D, Ergocalciferol, (DRISDOL) 1.25 MG (50000 UT) CAPS capsule  Prediabetes  Other depression  At risk for  diabetes mellitus  Class 3 severe obesity with serious comorbidity and body mass index (BMI) of 50.0 to 59.9 in adult, unspecified obesity type ([place])  PLAN:  Vitamin D Deficiency [name] was informed that low vitamin D levels contributes to fatigue and are associated with obesity, breast, and colon cancer. She agrees to continue to take prescription Vit D @50 ,000 IU every week #4 with no refills and will follow up for routine testing of vitamin D, at least 2-3 times per year. She was informed of the risk of over-replacement of vitamin D and agrees to not increase her dose unless she discusses this with [place] first. We will recheck her vitamin D level in 6 weeks when other labs are due. [name] agrees to follow up in 3 weeks.  Pre-Diabetes [name] will continue to work on weight loss, exercise, and decreasing simple carbohydrates in her diet to help decrease the risk of diabetes. [name] agreed to continue metformin 566m at 2 times a day and a prescription was not written today. [name] agreed to follow up with [place] to monitor her progress.  Diabetes risk counseling [name] was given extended (15 minutes) diabetes prevention counseling today. She is 47y.o. female and has risk factors for diabetes including pre-diabetes and obesity. We discussed intensive lifestyle modifications today with an emphasis on weight loss as well as increasing exercise and decreasing simple carbohydrates in her diet.  Depression with Emotional Eating Behaviors We discussed behavior modification techniques today to help [name] deal with  her emotional eating and depression. She denies history of nephrolithiasis.  She has agreed to continue to take Topamax 24m daily at bedtime #30 with no refills and agreed to follow up as directed.  Obesity [name] is currently in the action stage of change. As such, her goal is to continue with weight loss efforts. She has agreed to follow the Category 3 plan. We discussed the following Behavioral  Modification Strategies today: holiday eating strategies, celebration eating strategies, and planning for success. [name] has not been prescribed exercise at this time. [name] has agreed to follow up with our clinic in 3 weeks. She was informed of the importance of frequent follow up visits to maximize her success with intensive lifestyle modifications for her multiple health conditions.  ALLERGIES: No Known Allergies  MEDICATIONS: Current Outpatient Medications on File Prior to Visit  Medication Sig Dispense Refill  . ALPRAZolam (XANAX) 0.5 MG tablet Take 1 tablet (0.5 mg total) by mouth 3 times/day as needed-between meals & bedtime for anxiety. 30 tablet 0  . Cyanocobalamin (B-12 COMPLIANCE INJECTION) 1000 MCG/ML KIT Inject 1,000 mcg as directed every 30 (thirty) days.    .Marland Kitchenlosartan (COZAAR) 50 MG tablet Take 1 tablet (50 mg total) by mouth daily. 30 tablet 11  . metFORMIN (GLUCOPHAGE) 500 MG tablet Take 1 tablet (500 mg total) by mouth 2 (two) times daily with a meal. 180 tablet 3  . metoprolol tartrate (LOPRESSOR) 25 MG tablet Take 12.5 mg by mouth 2 (two) times daily.    . pantoprazole (PROTONIX) 40 MG tablet Take 40 mg by mouth daily.    .Marland KitchenPARoxetine (PAXIL) 20 MG tablet TAKE 1 TABLET BY MOUTH EVERY DAY 90 tablet 1  . UNABLE TO FIND Med Name: CPAP    . valACYclovir (VALTREX) 1000 MG tablet TAKE 2 TABLETS EVERY 12 HOURS FOR 1 DAY START ASAP AFTER SYTPTOM ONSET 6 tablet 2   No current facility-administered medications on file prior to visit.     PAST MEDICAL HISTORY: Past Medical History:  Diagnosis Date  . Anxiety   . ASTHMA   . B12 deficiency   . DELAYED GASTRIC EMPTYING   . DEPRESSION   . GERD   . Glucose intolerance (impaired glucose tolerance) 04/09/2015  . Hypertension   . OBESITY   . OBSTRUCTIVE SLEEP APNEA   . POLYCYSTIC OVARIAN DISEASE   . Prediabetes   . SMOKER   . SOB (shortness of breath)   . SVT (supraventricular tachycardia) ([place]   . Tachycardia   . Umbilical  hernia   . Umbilical hernia   . Vitamin B12 deficiency 03/2015 dx   start IM replacement q 30d  . Vitamin D deficiency 03/2015 dx    PAST SURGICAL HISTORY: Past Surgical History:  Procedure Laterality Date  . CESAREAN SECTION  97 & 98   x's 2  . Cocxyl removal  1995  . DILATION AND CURETTAGE OF UTERUS    . LAPAROSCOPY    . TUBAL LIGATION  1998    SOCIAL HISTORY: Social History   Tobacco Use  . Smoking status: Former Smoker    Packs/day: 0.25    Years: 30.00    Pack years: 7.50    Types: Cigarettes    Start date: 10/23/1987    Last attempt to quit: 10/23/2017    Years since quitting: 0.4  . Smokeless tobacco: Never Used  Substance Use Topics  . Alcohol use: Yes    Alcohol/week: 0.0 standard drinks    Comment: Rarely  .  Drug use: No    FAMILY HISTORY: Family History  Problem Relation Age of Onset  . Breast cancer Mother        dx 11/2009  . Diabetes Mother   . High blood pressure Mother   . Thyroid disease Mother   . Anxiety disorder Mother   . Obesity Mother   . Diabetes Maternal Aunt   . Prostate cancer Maternal Grandfather 19       also hx colon polyps  . Heart disease Paternal Grandmother   . Pancreatic cancer Paternal Grandmother 70  . High blood pressure Father   . High Cholesterol Father   . Depression Father   . Heart murmur Sister   . Adrenal disorder Neg Hx    ROS: Review of Systems  Constitutional: Positive for weight loss.  Gastrointestinal: Negative for diarrhea, nausea and vomiting.  Genitourinary:       Negative for polyuria.  Musculoskeletal:       Negative for muscle weakness.  Endo/Heme/Allergies: Negative for polydipsia.       Negative for hypoglycemia.  Psychiatric/Behavioral: Positive for depression.   PHYSICAL EXAM: Blood pressure 112/61, pulse 61, temperature (!) 97 F (36.1 C), temperature source Oral, height 5' 5"  (1.651 m), weight (!) 349 lb (158.3 kg), SpO2 94 %. Body mass index is 58.08 kg/m. Physical Exam Vitals  signs reviewed.  Constitutional:      Appearance: Normal appearance. She is obese.  Cardiovascular:     Rate and Rhythm: Normal rate.  Pulmonary:     Effort: Pulmonary effort is normal.  Musculoskeletal: Normal range of motion.  Skin:    General: Skin is warm and dry.  Neurological:     Mental Status: She is alert and oriented to person, place, and time.  Psychiatric:        Mood and Affect: Mood normal.        Behavior: Behavior normal.    RECENT LABS AND TESTS: BMET    Component Value Date/Time   NA 139 02/14/2018 1146   K 4.7 02/14/2018 1146   CL 102 02/14/2018 1146   CO2 19 (L) 02/14/2018 1146   GLUCOSE 82 02/14/2018 1146   GLUCOSE 93 11/24/2017 1527   BUN 12 02/14/2018 1146   CREATININE 0.90 02/14/2018 1146   CALCIUM 8.9 02/14/2018 1146   GFRNONAA 77 02/14/2018 1146   GFRAA 89 02/14/2018 1146   Lab Results  Component Value Date   HGBA1C 5.7 (H) 02/14/2018   HGBA1C 6.1 11/24/2017   HGBA1C 5.9 05/19/2017   HGBA1C 5.9 05/25/2016   HGBA1C 5.9 10/14/2015   Lab Results  Component Value Date   INSULIN 19.1 02/14/2018   CBC    Component Value Date/Time   WBC 7.7 05/19/2017 1202   RBC 4.18 05/19/2017 1202   HGB 12.4 05/19/2017 1202   HCT 36.6 05/19/2017 1202   PLT 386 05/19/2017 1202   MCV 87.6 05/19/2017 1202   MCH 29.7 05/19/2017 1202   MCHC 33.9 05/19/2017 1202   RDW 12.9 05/19/2017 1202   LYMPHSABS 2,225 05/19/2017 1202   MONOABS 0.8 09/23/2016 0301   EOSABS 123 05/19/2017 1202   BASOSABS 31 05/19/2017 1202   Iron/TIBC/Ferritin/ %Sat No results found for: IRON, TIBC, FERRITIN, IRONPCTSAT Lipid Panel     Component Value Date/Time   CHOL 201 (H) 11/24/2017 1527   TRIG 117.0 11/24/2017 1527   HDL 45.30 11/24/2017 1527   CHOLHDL 4 11/24/2017 1527   VLDL 23.4 11/24/2017 1527   LDLCALC 133 (H) 11/24/2017  1527   LDLDIRECT 154.1 08/16/2012 0828   Hepatic Function Panel     Component Value Date/Time   PROT 7.2 02/14/2018 1146   ALBUMIN 4.1  02/14/2018 1146   AST 6 02/14/2018 1146   ALT 12 02/14/2018 1146   ALKPHOS 85 02/14/2018 1146   BILITOT 0.3 02/14/2018 1146   BILIDIR 0.0 12/12/2012 1144      Component Value Date/Time   TSH 2.480 02/14/2018 1146   TSH 2.94 10/27/2017 1353   TSH 1.80 05/19/2017 1011   Results for TRIVIA, HEFFELFINGER (MRN [id number]) as of 03/29/2018 08:40  Ref. Range 11/24/2017 15:27  VITD Latest Ref Range: 30.00 - 100.00 ng/mL 46.07    OBESITY BEHAVIORAL INTERVENTION VISIT  Today's visit was # 4   Starting weight: 368 lbs Starting date: 02/14/18 Today's weight : Weight: (!) 349 lb (158.3 kg)  Today's date: 03/28/2018 Total lbs lost to date: 43  ASK: We discussed the diagnosis of obesity with [name] today and [name] agreed to give [place] permission to discuss obesity behavioral modification therapy today.  ASSESS: [name] has the diagnosis of obesity and her BMI today is 58.0. Ticia is in the action stage of change.   ADVISE: Nadina was educated on the multiple health risks of obesity as well as the benefit of weight loss to improve her health. She was advised of the need for long term treatment and the importance of lifestyle modifications to improve her current health and to decrease her risk of future health problems.  AGREE: Multiple dietary modification options and treatment options were discussed and Cartier agreed to follow the recommendations documented in the above note.  ARRANGE: Tennille was educated on the importance of frequent visits to treat obesity as outlined per CMS and USPSTF guidelines and agreed to schedule her next follow up appointment today.  I, Marcille Blanco, am acting as Location manager for [employer], FNP-C.  I have reviewed the above documentation for accuracy and completeness, and I agree with the above.  - Shahd Occhipinti, FNP-C.

## 2018-04-05 ENCOUNTER — Ambulatory Visit (HOSPITAL_COMMUNITY)
Admission: RE | Admit: 2018-04-05 | Discharge: 2018-04-05 | Disposition: A | Discharge status: Home / Self Care | Payer: BLUE CROSS/BLUE SHIELD | Source: Ambulatory Visit | Attending: Obstetrics and Gynecology | Admitting: Obstetrics and Gynecology

## 2018-04-05 DIAGNOSIS — Z01419 Encounter for gynecological examination (general) (routine) without abnormal findings: Secondary | ICD-10-CM | POA: Insufficient documentation

## 2018-04-13 ENCOUNTER — Ambulatory Visit (INDEPENDENT_AMBULATORY_CARE_PROVIDER_SITE_OTHER): Payer: Self-pay | Admitting: General Practice

## 2018-04-13 DIAGNOSIS — E538 Deficiency of other specified B group vitamins: Secondary | ICD-10-CM

## 2018-04-13 MED ORDER — CYANOCOBALAMIN 1000 MCG/ML IJ SOLN
1000.0000 ug | Freq: Once | INTRAMUSCULAR | Status: AC
  Administered 2018-04-13: 1000 ug via INTRAMUSCULAR

## 2018-04-16 ENCOUNTER — Ambulatory Visit (INDEPENDENT_AMBULATORY_CARE_PROVIDER_SITE_OTHER): Payer: BLUE CROSS/BLUE SHIELD | Admitting: Family Medicine

## 2018-04-16 ENCOUNTER — Encounter (INDEPENDENT_AMBULATORY_CARE_PROVIDER_SITE_OTHER): Payer: Self-pay | Admitting: Family Medicine

## 2018-04-16 VITALS — BP 125/79 | HR 68 | Temp 98.1°F | Ht 65.0 in | Wt 348.0 lb

## 2018-04-16 DIAGNOSIS — Z6841 Body Mass Index (BMI) 40.0 and over, adult: Secondary | ICD-10-CM

## 2018-04-16 DIAGNOSIS — E559 Vitamin D deficiency, unspecified: Secondary | ICD-10-CM

## 2018-04-16 DIAGNOSIS — F329 Major depressive disorder, single episode, unspecified: Secondary | ICD-10-CM

## 2018-04-16 DIAGNOSIS — F32A Depression, unspecified: Secondary | ICD-10-CM

## 2018-04-16 DIAGNOSIS — Z9189 Other specified personal risk factors, not elsewhere classified: Secondary | ICD-10-CM | POA: Diagnosis not present

## 2018-04-16 DIAGNOSIS — F419 Anxiety disorder, unspecified: Secondary | ICD-10-CM

## 2018-04-16 MED ORDER — VITAMIN D (ERGOCALCIFEROL) 1.25 MG (50000 UNIT) PO CAPS: 50000.0000 [IU] | ORAL_CAPSULE | ORAL | 0 refills | Status: DC

## 2018-04-17 ENCOUNTER — Encounter (INDEPENDENT_AMBULATORY_CARE_PROVIDER_SITE_OTHER): Payer: Self-pay | Admitting: Family Medicine

## 2018-04-17 NOTE — Progress Notes (Signed)
Office: [phone]  /  Fax: [phone]   HPI:   Chief Complaint: OBESITY Solei is here to discuss her progress with her obesity treatment plan. She is on the Category 3 plan and is following her eating plan approximately 70 % of the time. She states she is exercising 0 minutes 0 times per week. Cherrelle is back on the meal plan after being off the plan somewhat over the holidays. Topamax was prescribed  at last visit for cravings but she has not started the medication yet because she was nervous about taking it. She would like more fruit choices. She plans on joining a gym.  Her weight is (!) 348 lb (157.9 kg) today and has had a weight loss of 1 pound over a period of 2 weeks since her last visit. She has lost 20 lbs since starting treatment with [place].  Anxiety and Depression Batoul has a new diagnosis of anxiety and depression. She is taking Paxil and has been on Paxil for over 10 years. She tried to wean off over 2 weeks and had problems and ended up in the ED. Melany would like to wean off of Paxil. She does admit to food cravings and eating when she is not hungry.   Vitamin D deficiency Lashann has a diagnosis of vitamin D deficiency. She is currently taking vit D and is not at goal. She denies nausea, vomiting, or muscle weakness.  .At risk for osteopenia and osteoporosis Nealy is at higher risk of osteopenia and osteoporosis due to vitamin D deficiency.   ASSESSMENT AND PLAN:  Vitamin D deficiency - Plan: Vitamin D, Ergocalciferol, (DRISDOL) 1.25 MG (50000 UT) CAPS capsule  Anxiety and depression  At risk for osteoporosis  Class 3 severe obesity with serious comorbidity and body mass index (BMI) of 50.0 to 59.9 in adult, unspecified obesity type ([place])  PLAN:  Vitamin D Deficiency Courney was informed that low vitamin D levels contributes to fatigue and are associated with obesity, breast, and colon cancer. She agrees to continue to take prescription Vit D @50 ,000 IU every week #4 with  no refills and will follow up for routine testing of vitamin D, at least 2-3 times per year. She was informed of the risk of over-replacement of vitamin D and agrees to not increase her dose unless she discusses this with [place] first.  Liany agrees to follow up as directed.  At risk for osteopenia and osteoporosis Belkys was given extended (15 minutes) osteoporosis prevention counseling today. Tsion is at risk for osteopenia and osteoporosis due to her vitamin D deficiency. She was encouraged to take her vitamin D and follow her higher calcium diet and increase strengthening exercise to help strengthen her bones and decrease her risk of osteopenia and osteoporosis.  Anxiety and Depression Alizon agrees to discuss a slower weaning process off of Paxil with her PCP. We discussed that Paxil is weight promoting. She plans to speak with her PCP about this. We discussed if she weans off we can discuss starting a new medication for anxiety and depression which is not weight promoting. We discussed the topamax for food cravings. I advised her of possible side effects and she plans on starting the medication tonight.   Obesity Deronda is currently in the action stage of change. As such, her goal is to continue with weight loss efforts. She has agreed to follow the Category 3 plan. Jacqueline has been instructed to start riding her exercise bike and work up to 30 minutes a  few times per week. . We discussed the following Behavioral Modification Strategies today: better snacking choices and planning for success. Palma has not started topamax yet. She plans to start tonight. She was given Research scientist (life sciences)" handout.  Chelsei has agreed to follow up with our clinic in 2 weeks. She was informed of the importance of frequent follow up visits to maximize her success with intensive lifestyle modifications for her multiple health conditions.  ALLERGIES: No Known Allergies  MEDICATIONS: Current Outpatient Medications on File Prior to Visit   Medication Sig Dispense Refill  . ALPRAZolam (XANAX) 0.5 MG tablet Take 1 tablet (0.5 mg total) by mouth 3 times/day as needed-between meals & bedtime for anxiety. 30 tablet 0  . Cyanocobalamin (B-12 COMPLIANCE INJECTION) 1000 MCG/ML KIT Inject 1,000 mcg as directed every 30 (thirty) days.    [name] losartan (COZAAR) 50 MG tablet Take 1 tablet (50 mg total) by mouth daily. 30 tablet 11  . metFORMIN (GLUCOPHAGE) 500 MG tablet Take 1 tablet (500 mg total) by mouth 2 (two) times daily with a meal. 180 tablet 3  . metoprolol tartrate (LOPRESSOR) 25 MG tablet Take 12.5 mg by mouth 2 (two) times daily.    . pantoprazole (PROTONIX) 40 MG tablet Take 40 mg by mouth daily.    [name] PARoxetine (PAXIL) 20 MG tablet TAKE 1 TABLET BY MOUTH EVERY DAY 90 tablet 1  . topiramate (TOPAMAX) 50 MG tablet Take 1 tablet (50 mg total) by mouth at bedtime. 30 tablet 0  . UNABLE TO FIND Med Name: CPAP    . valACYclovir (VALTREX) 1000 MG tablet TAKE 2 TABLETS EVERY 12 HOURS FOR 1 DAY START ASAP AFTER SYTPTOM ONSET 6 tablet 2   No current facility-administered medications on file prior to visit.     PAST MEDICAL HISTORY: Past Medical History:  Diagnosis Date  . Anxiety   . ASTHMA   . B12 deficiency   . DELAYED GASTRIC EMPTYING   . DEPRESSION   . GERD   . Glucose intolerance (impaired glucose tolerance) 04/09/2015  . Hypertension   . OBESITY   . OBSTRUCTIVE SLEEP APNEA   . POLYCYSTIC OVARIAN DISEASE   . Prediabetes   . SMOKER   . SOB (shortness of breath)   . SVT (supraventricular tachycardia) ([place])   . Tachycardia   . Umbilical hernia   . Umbilical hernia   . Vitamin B12 deficiency 03/2015 dx   start IM replacement q 30d  . Vitamin D deficiency 03/2015 dx    PAST SURGICAL HISTORY: Past Surgical History:  Procedure Laterality Date  . CESAREAN SECTION  97 & 98   x's 2  . Cocxyl removal  1995  . DILATION AND CURETTAGE OF UTERUS    . LAPAROSCOPY    . TUBAL LIGATION  1998    SOCIAL HISTORY: Social  History   Tobacco Use  . Smoking status: Former Smoker    Packs/day: 0.25    Years: 30.00    Pack years: 7.50    Types: Cigarettes    Start date: 10/23/1987    Last attempt to quit: 10/23/2017    Years since quitting: 0.4  . Smokeless tobacco: Never Used  Substance Use Topics  . Alcohol use: Yes    Alcohol/week: 0.0 standard drinks    Comment: Rarely  . Drug use: No    FAMILY HISTORY: Family History  Problem Relation Age of Onset  . Breast cancer Mother        dx 11/2009  . Diabetes  Mother   . High blood pressure Mother   . Thyroid disease Mother   . Anxiety disorder Mother   . Obesity Mother   . Diabetes Maternal Aunt   . Prostate cancer Maternal Grandfather 65       also hx colon polyps  . Heart disease Paternal Grandmother   . Pancreatic cancer Paternal Grandmother 25  . High blood pressure Father   . High Cholesterol Father   . Depression Father   . Heart murmur Sister   . Adrenal disorder Neg Hx     ROS: Review of Systems  Constitutional: Positive for weight loss.  Gastrointestinal: Negative for nausea and vomiting.  Musculoskeletal:       Negative for muscle weakness.  Psychiatric/Behavioral: Positive for depression. The patient is nervous/anxious.     PHYSICAL EXAM: Blood pressure 125/79, pulse 68, temperature 98.1 F (36.7 C), temperature source Oral, height 5' 5"  (1.651 m), weight (!) 348 lb (157.9 kg), SpO2 98 %. Body mass index is 57.91 kg/m. Physical Exam Vitals signs reviewed.  Constitutional:      Appearance: Normal appearance. She is obese.  Cardiovascular:     Rate and Rhythm: Normal rate.  Pulmonary:     Effort: Pulmonary effort is normal.  Musculoskeletal: Normal range of motion.  Skin:    General: Skin is warm and dry.  Neurological:     Mental Status: She is alert and oriented to person, place, and time.  Psychiatric:        Mood and Affect: Mood normal.        Behavior: Behavior normal.    RECENT LABS AND TESTS: BMET      Component Value Date/Time   NA 139 02/14/2018 1146   K 4.7 02/14/2018 1146   CL 102 02/14/2018 1146   CO2 19 (L) 02/14/2018 1146   GLUCOSE 82 02/14/2018 1146   GLUCOSE 93 11/24/2017 1527   BUN 12 02/14/2018 1146   CREATININE 0.90 02/14/2018 1146   CALCIUM 8.9 02/14/2018 1146   GFRNONAA 77 02/14/2018 1146   GFRAA 89 02/14/2018 1146   Lab Results  Component Value Date   HGBA1C 5.7 (H) 02/14/2018   HGBA1C 6.1 11/24/2017   HGBA1C 5.9 05/19/2017   HGBA1C 5.9 05/25/2016   HGBA1C 5.9 10/14/2015   Lab Results  Component Value Date   INSULIN 19.1 02/14/2018   CBC    Component Value Date/Time   WBC 7.7 05/19/2017 1202   RBC 4.18 05/19/2017 1202   HGB 12.4 05/19/2017 1202   HCT 36.6 05/19/2017 1202   PLT 386 05/19/2017 1202   MCV 87.6 05/19/2017 1202   MCH 29.7 05/19/2017 1202   MCHC 33.9 05/19/2017 1202   RDW 12.9 05/19/2017 1202   LYMPHSABS 2,225 05/19/2017 1202   MONOABS 0.8 09/23/2016 0301   EOSABS 123 05/19/2017 1202   BASOSABS 31 05/19/2017 1202   Iron/TIBC/Ferritin/ %Sat No results found for: IRON, TIBC, FERRITIN, IRONPCTSAT Lipid Panel     Component Value Date/Time   CHOL 201 (H) 11/24/2017 1527   TRIG 117.0 11/24/2017 1527   HDL 45.30 11/24/2017 1527   CHOLHDL 4 11/24/2017 1527   VLDL 23.4 11/24/2017 1527   LDLCALC 133 (H) 11/24/2017 1527   LDLDIRECT 154.1 08/16/2012 0828   Hepatic Function Panel     Component Value Date/Time   PROT 7.2 02/14/2018 1146   ALBUMIN 4.1 02/14/2018 1146   AST 6 02/14/2018 1146   ALT 12 02/14/2018 1146   ALKPHOS 85 02/14/2018 1146   BILITOT  0.3 02/14/2018 1146   BILIDIR 0.0 12/12/2012 1144      Component Value Date/Time   TSH 2.480 02/14/2018 1146   TSH 2.94 10/27/2017 1353   TSH 1.80 05/19/2017 1011   Results for AQUA, DENSLOW (MRN [id number]) as of 04/17/2018 12:56  Ref. Range 07/16/2015 07:43  Vitamin D 1, 25 (OH) Total Latest Ref Range: 18 - 72 pg/mL 22    OBESITY BEHAVIORAL INTERVENTION VISIT  Today's visit was  # 5   Starting weight: 368 lbs Starting date: 02/14/18 Today's weight : Weight: (!) 348 lb (157.9 kg)  Today's date: 04/16/2018 Total lbs lost to date: 20  ASK: We discussed the diagnosis of obesity with [name] today and Neyra agreed to give [place] permission to discuss obesity behavioral modification therapy today.  ASSESS: Channah has the diagnosis of obesity and her BMI today is 57.9. Leilynn is in the action stage of change.   ADVISE: Arizbeth was educated on the multiple health risks of obesity as well as the benefit of weight loss to improve her health. She was advised of the need for long term treatment and the importance of lifestyle modifications to improve her current health and to decrease her risk of future health problems.  AGREE: Multiple dietary modification options and treatment options were discussed and Antinette agreed to follow the recommendations documented in the above note.  ARRANGE: Isis was educated on the importance of frequent visits to treat obesity as outlined per CMS and USPSTF guidelines and agreed to schedule her next follow up appointment today.  I, Marcille Blanco, am acting as Location manager for [employer], FNP-C.  I have reviewed the above documentation for accuracy and completeness, and I agree with the above.  - Jerre Vandrunen, FNP-C.

## 2018-04-20 ENCOUNTER — Other Ambulatory Visit (INDEPENDENT_AMBULATORY_CARE_PROVIDER_SITE_OTHER): Payer: Self-pay | Admitting: Family Medicine

## 2018-04-20 DIAGNOSIS — E559 Vitamin D deficiency, unspecified: Secondary | ICD-10-CM

## 2018-05-02 ENCOUNTER — Ambulatory Visit (INDEPENDENT_AMBULATORY_CARE_PROVIDER_SITE_OTHER): Payer: BLUE CROSS/BLUE SHIELD | Admitting: Family Medicine

## 2018-05-02 ENCOUNTER — Encounter (INDEPENDENT_AMBULATORY_CARE_PROVIDER_SITE_OTHER): Payer: Self-pay | Admitting: Family Medicine

## 2018-05-02 VITALS — BP 134/69 | HR 77 | Temp 97.7°F | Ht 65.0 in | Wt 344.0 lb

## 2018-05-02 DIAGNOSIS — Z6841 Body Mass Index (BMI) 40.0 and over, adult: Secondary | ICD-10-CM

## 2018-05-02 DIAGNOSIS — Z9189 Other specified personal risk factors, not elsewhere classified: Secondary | ICD-10-CM | POA: Diagnosis not present

## 2018-05-02 DIAGNOSIS — E66813 Obesity, class 3: Secondary | ICD-10-CM

## 2018-05-02 DIAGNOSIS — F418 Other specified anxiety disorders: Secondary | ICD-10-CM

## 2018-05-02 DIAGNOSIS — E559 Vitamin D deficiency, unspecified: Secondary | ICD-10-CM | POA: Diagnosis not present

## 2018-05-02 MED ORDER — TOPIRAMATE 50 MG PO TABS: 50.0000 mg | ORAL_TABLET | Freq: Every day | ORAL | 0 refills | Status: DC

## 2018-05-03 ENCOUNTER — Other Ambulatory Visit: Payer: Self-pay | Admitting: Gastroenterology

## 2018-05-03 NOTE — Progress Notes (Signed)
Office: [phone]  /  Fax: [phone]   HPI:   Chief Complaint: OBESITY [name] is here to discuss her progress with her obesity treatment plan. She is on the Category 3 plan and is following her eating plan approximately 80 % of the time. She states she is exercising 0 minutes 0 times per week. [name] is bored with her dinner options. She denies polyphagia.  Her weight is (!) 344 lb (156 kg) today and has had a weight loss of 4 pounds over a period of 2 weeks since her last visit. She has lost 24 lbs since starting treatment with [place].  Vitamin D deficiency [name] has a diagnosis of vitamin D deficiency. She is currently taking vit D and is not at goal. She denies nausea, vomiting, or muscle weakness.  At risk for osteopenia and osteoporosis [name] is at higher risk of osteopenia and osteoporosis due to vitamin D deficiency.   Depression with Anxiety [name] started Topamax 108m and admits dysgeusia. She is still taking Paxil every day. She is having decreased hunger and cravings. She has been working on behavior modification techniques to help reduce her emotional eating and has been somewhat successful.   ASSESSMENT AND PLAN:  Vitamin D deficiency  Depression with anxiety - Plan: topiramate (TOPAMAX) 50 MG tablet  At risk for osteoporosis  Class 3 severe obesity with serious comorbidity and body mass index (BMI) of 50.0 to 59.9 in adult, unspecified obesity type ([place]  PLAN:  Vitamin D Deficiency [name] was informed that low vitamin D levels contributes to fatigue and are associated with obesity, breast, and colon cancer. She agrees to continue to take prescription Vit D @50 ,000 IU every week and will follow up for routine testing of vitamin D, at least 2-3 times per year. She was informed of the risk of over-replacement of vitamin D and agrees to not increase her dose unless she discusses this with [place]. [name] agrees to follow up in 2 weeks.  At risk for osteopenia and  osteoporosis [name] was given extended (15 minutes) osteoporosis prevention counseling today. [name] is at risk for osteopenia and osteoporosis due to her vitamin D deficiency. She was encouraged to take her vitamin D and follow her higher calcium diet and increase strengthening exercise to help strengthen her bones and decrease her risk of osteopenia and osteoporosis.  Depression with Anxiety She will continue to take Paxil which is prescribed by her PCP. [name] agrees to continue Topamax 53mqhs #30 with no refills and will follow up as directed.  Obesity [name] is currently in the action stage of change. As such, her goal is to continue with weight loss efforts. She has agreed to follow the Category 3 plan. She may have [place] Clapelights (less than 250 calories) plus an egg or yogurt for breakfast. She was given the Recipes and Seasonings handout. We discussed the following Behavioral Modification Strategies today: work on meal planning and easy cooking plans, better snacking choices, and planning for success. [name] has not been prescribed exercise at this time. [name] has agreed to follow up with our clinic in 2 weeks. She was informed of the importance of frequent follow up visits to maximize her success with intensive lifestyle modifications for her multiple health conditions.  ALLERGIES: No Known Allergies  MEDICATIONS: Current Outpatient Medications on File Prior to Visit  Medication Sig Dispense Refill  . ALPRAZolam (XANAX) 0.5 MG tablet Take 1 tablet (0.5 mg total) by mouth 3 times/day as needed-between meals &  bedtime for anxiety. 30 tablet 0  . Cyanocobalamin (B-12 COMPLIANCE INJECTION) 1000 MCG/ML KIT Inject 1,000 mcg as directed every 30 (thirty) days.    [name] losartan (COZAAR) 50 MG tablet Take 1 tablet (50 mg total) by mouth daily. 30 tablet 11  . metFORMIN (GLUCOPHAGE) 500 MG tablet Take 1 tablet (500 mg total) by mouth 2 (two) times daily with a meal. 180 tablet 3  . metoprolol  tartrate (LOPRESSOR) 25 MG tablet Take 12.5 mg by mouth 2 (two) times daily.    [name] PARoxetine (PAXIL) 20 MG tablet TAKE 1 TABLET BY MOUTH EVERY DAY 90 tablet 1  . UNABLE TO FIND Med Name: CPAP    . valACYclovir (VALTREX) 1000 MG tablet TAKE 2 TABLETS EVERY 12 HOURS FOR 1 DAY START ASAP AFTER SYTPTOM ONSET 6 tablet 2  . Vitamin D, Ergocalciferol, (DRISDOL) 1.25 MG (50000 UT) CAPS capsule Take 1 capsule (50,000 Units total) by mouth every 7 (seven) days. 4 capsule 0   No current facility-administered medications on file prior to visit.     PAST MEDICAL HISTORY: Past Medical History:  Diagnosis Date  . Anxiety   . ASTHMA   . B12 deficiency   . DELAYED GASTRIC EMPTYING   . DEPRESSION   . GERD   . Glucose intolerance (impaired glucose tolerance) 04/09/2015  . Hypertension   . OBESITY   . OBSTRUCTIVE SLEEP APNEA   . POLYCYSTIC OVARIAN DISEASE   . Prediabetes   . SMOKER   . SOB (shortness of breath)   . SVT (supraventricular tachycardia) ([place])   . Tachycardia   . Umbilical hernia   . Umbilical hernia   . Vitamin B12 deficiency 03/2015 dx   start IM replacement q 30d  . Vitamin D deficiency 03/2015 dx    PAST SURGICAL HISTORY: Past Surgical History:  Procedure Laterality Date  . CESAREAN SECTION  97 & 98   x's 2  . Cocxyl removal  1995  . DILATION AND CURETTAGE OF UTERUS    . LAPAROSCOPY    . TUBAL LIGATION  1998    SOCIAL HISTORY: Social History   Tobacco Use  . Smoking status: Former Smoker    Packs/day: 0.25    Years: 30.00    Pack years: 7.50    Types: Cigarettes    Start date: 10/23/1987    Last attempt to quit: 10/23/2017    Years since quitting: 0.5  . Smokeless tobacco: Never Used  Substance Use Topics  . Alcohol use: Yes    Alcohol/week: 0.0 standard drinks    Comment: Rarely  . Drug use: No    FAMILY HISTORY: Family History  Problem Relation Age of Onset  . Breast cancer Mother        dx 11/2009  . Diabetes Mother   . High blood pressure Mother    . Thyroid disease Mother   . Anxiety disorder Mother   . Obesity Mother   . Diabetes Maternal Aunt   . Prostate cancer Maternal Grandfather 14       also hx colon polyps  . Heart disease Paternal Grandmother   . Pancreatic cancer Paternal Grandmother 55  . High blood pressure Father   . High Cholesterol Father   . Depression Father   . Heart murmur Sister   . Adrenal disorder Neg Hx    ROS: Review of Systems  Constitutional: Positive for weight loss.  HENT:       Positive for dysguesia.  Gastrointestinal: Negative for nausea and  vomiting.  Musculoskeletal:       Negative for muscle weakness.  Endo/Heme/Allergies:       Negative for polyphagia.  Psychiatric/Behavioral: Positive for depression. The patient is nervous/anxious.    PHYSICAL EXAM: Blood pressure 134/69, pulse 77, temperature 97.7 F (36.5 C), temperature source Oral, height 5' 5"  (1.651 m), weight (!) 344 lb (156 kg), SpO2 97 %. Body mass index is 57.24 kg/m. Physical Exam Vitals signs reviewed.  Constitutional:      Appearance: Normal appearance. She is obese.  Cardiovascular:     Rate and Rhythm: Normal rate.  Pulmonary:     Effort: Pulmonary effort is normal.  Musculoskeletal: Normal range of motion.  Skin:    General: Skin is warm and dry.  Neurological:     Mental Status: She is alert and oriented to person, place, and time.  Psychiatric:        Mood and Affect: Mood normal.        Behavior: Behavior normal.    RECENT LABS AND TESTS: BMET    Component Value Date/Time   NA 139 02/14/2018 1146   K 4.7 02/14/2018 1146   CL 102 02/14/2018 1146   CO2 19 (L) 02/14/2018 1146   GLUCOSE 82 02/14/2018 1146   GLUCOSE 93 11/24/2017 1527   BUN 12 02/14/2018 1146   CREATININE 0.90 02/14/2018 1146   CALCIUM 8.9 02/14/2018 1146   GFRNONAA 77 02/14/2018 1146   GFRAA 89 02/14/2018 1146   Lab Results  Component Value Date   HGBA1C 5.7 (H) 02/14/2018   HGBA1C 6.1 11/24/2017   HGBA1C 5.9 05/19/2017    HGBA1C 5.9 05/25/2016   HGBA1C 5.9 10/14/2015   Lab Results  Component Value Date   INSULIN 19.1 02/14/2018   CBC    Component Value Date/Time   WBC 7.7 05/19/2017 1202   RBC 4.18 05/19/2017 1202   HGB 12.4 05/19/2017 1202   HCT 36.6 05/19/2017 1202   PLT 386 05/19/2017 1202   MCV 87.6 05/19/2017 1202   MCH 29.7 05/19/2017 1202   MCHC 33.9 05/19/2017 1202   RDW 12.9 05/19/2017 1202   LYMPHSABS 2,225 05/19/2017 1202   MONOABS 0.8 09/23/2016 0301   EOSABS 123 05/19/2017 1202   BASOSABS 31 05/19/2017 1202   Iron/TIBC/Ferritin/ %Sat No results found for: IRON, TIBC, FERRITIN, IRONPCTSAT Lipid Panel     Component Value Date/Time   CHOL 201 (H) 11/24/2017 1527   TRIG 117.0 11/24/2017 1527   HDL 45.30 11/24/2017 1527   CHOLHDL 4 11/24/2017 1527   VLDL 23.4 11/24/2017 1527   LDLCALC 133 (H) 11/24/2017 1527   LDLDIRECT 154.1 08/16/2012 0828   Hepatic Function Panel     Component Value Date/Time   PROT 7.2 02/14/2018 1146   ALBUMIN 4.1 02/14/2018 1146   AST 6 02/14/2018 1146   ALT 12 02/14/2018 1146   ALKPHOS 85 02/14/2018 1146   BILITOT 0.3 02/14/2018 1146   BILIDIR 0.0 12/12/2012 1144      Component Value Date/Time   TSH 2.480 02/14/2018 1146   TSH 2.94 10/27/2017 1353   TSH 1.80 05/19/2017 1011   Results for ALANNIE, AMODIO (MRN [id number]) as of 05/03/2018 11:18  Ref. Range 11/24/2017 15:27  VITD Latest Ref Range: 30.00 - 100.00 ng/mL 46.07    OBESITY BEHAVIORAL INTERVENTION VISIT  Today's visit was # 6   Starting weight: 368 lbs Starting date: 02/14/18 Today's weight : Weight: (!) 344 lb (156 kg)  Today's date: 05/02/2018 Total lbs lost to date: 24  ASK: We discussed the diagnosis of obesity with [name] today and Anvi agreed to give [place] permission to discuss obesity behavioral modification therapy today.  ASSESS: [name] has the diagnosis of obesity and her BMI today is 57.2. [name] is in the action stage of change.   ADVISE: Dandra was educated on the  multiple health risks of obesity as well as the benefit of weight loss to improve her health. She was advised of the need for long term treatment and the importance of lifestyle modifications to improve her current health and to decrease her risk of future health problems.  AGREE: Multiple dietary modification options and treatment options were discussed and Chaitra agreed to follow the recommendations documented in the above note.  ARRANGE: [name] was educated on the importance of frequent visits to treat obesity as outlined per CMS and USPSTF guidelines and agreed to schedule her next follow up appointment today.  I, Marcille Blanco, am acting as Location manager for [employer], FNP-C.  I have reviewed the above documentation for accuracy and completeness, and I agree with the above.  - Zailyn Thoennes, FNP-C.

## 2018-05-07 ENCOUNTER — Encounter (INDEPENDENT_AMBULATORY_CARE_PROVIDER_SITE_OTHER): Payer: Self-pay | Admitting: Family Medicine

## 2018-05-16 ENCOUNTER — Encounter (INDEPENDENT_AMBULATORY_CARE_PROVIDER_SITE_OTHER): Payer: Self-pay | Admitting: Family Medicine

## 2018-05-16 ENCOUNTER — Ambulatory Visit (INDEPENDENT_AMBULATORY_CARE_PROVIDER_SITE_OTHER): Payer: BLUE CROSS/BLUE SHIELD | Admitting: Family Medicine

## 2018-05-16 VITALS — BP 107/68 | HR 65 | Temp 97.8°F | Ht 65.0 in | Wt 344.0 lb

## 2018-05-16 DIAGNOSIS — Z9189 Other specified personal risk factors, not elsewhere classified: Secondary | ICD-10-CM

## 2018-05-16 DIAGNOSIS — F329 Major depressive disorder, single episode, unspecified: Secondary | ICD-10-CM

## 2018-05-16 DIAGNOSIS — E559 Vitamin D deficiency, unspecified: Secondary | ICD-10-CM

## 2018-05-16 DIAGNOSIS — F32A Depression, unspecified: Secondary | ICD-10-CM

## 2018-05-16 DIAGNOSIS — F419 Anxiety disorder, unspecified: Secondary | ICD-10-CM | POA: Diagnosis not present

## 2018-05-16 DIAGNOSIS — Z6841 Body Mass Index (BMI) 40.0 and over, adult: Secondary | ICD-10-CM

## 2018-05-16 MED ORDER — VENLAFAXINE HCL ER 37.5 MG PO CP24: 37.5000 mg | ORAL_CAPSULE | Freq: Every day | ORAL | 0 refills | Status: DC

## 2018-05-16 MED ORDER — VITAMIN D (ERGOCALCIFEROL) 1.25 MG (50000 UNIT) PO CAPS: 50000.0000 [IU] | ORAL_CAPSULE | ORAL | 0 refills | Status: DC

## 2018-05-17 ENCOUNTER — Encounter (HOSPITAL_COMMUNITY): Payer: Self-pay | Admitting: Emergency Medicine

## 2018-05-17 ENCOUNTER — Emergency Department (HOSPITAL_COMMUNITY): Payer: BLUE CROSS/BLUE SHIELD

## 2018-05-17 ENCOUNTER — Other Ambulatory Visit: Payer: Self-pay

## 2018-05-17 ENCOUNTER — Encounter (INDEPENDENT_AMBULATORY_CARE_PROVIDER_SITE_OTHER): Payer: Self-pay | Admitting: Family Medicine

## 2018-05-17 ENCOUNTER — Ambulatory Visit (INDEPENDENT_AMBULATORY_CARE_PROVIDER_SITE_OTHER): Payer: Self-pay | Admitting: Family Medicine

## 2018-05-17 ENCOUNTER — Ambulatory Visit: Payer: Self-pay

## 2018-05-17 ENCOUNTER — Emergency Department (HOSPITAL_COMMUNITY)
Admission: EM | Admit: 2018-05-17 | Discharge: 2018-05-18 | Disposition: A | Discharge status: Home / Self Care | Payer: BLUE CROSS/BLUE SHIELD | Attending: Emergency Medicine | Admitting: Emergency Medicine

## 2018-05-17 DIAGNOSIS — Z87891 Personal history of nicotine dependence: Secondary | ICD-10-CM | POA: Insufficient documentation

## 2018-05-17 DIAGNOSIS — R079 Chest pain, unspecified: Secondary | ICD-10-CM | POA: Diagnosis not present

## 2018-05-17 DIAGNOSIS — I10 Essential (primary) hypertension: Secondary | ICD-10-CM | POA: Insufficient documentation

## 2018-05-17 DIAGNOSIS — Z79899 Other long term (current) drug therapy: Secondary | ICD-10-CM | POA: Insufficient documentation

## 2018-05-17 DIAGNOSIS — J45909 Unspecified asthma, uncomplicated: Secondary | ICD-10-CM | POA: Insufficient documentation

## 2018-05-17 DIAGNOSIS — F419 Anxiety disorder, unspecified: Secondary | ICD-10-CM | POA: Insufficient documentation

## 2018-05-17 DIAGNOSIS — F329 Major depressive disorder, single episode, unspecified: Secondary | ICD-10-CM | POA: Insufficient documentation

## 2018-05-17 LAB — CBC
HCT: 37.8 % (ref 36.0–46.0)
Hemoglobin: 11.6 g/dL — ABNORMAL LOW (ref 12.0–15.0)
MCH: 28.4 pg (ref 26.0–34.0)
MCHC: 30.7 g/dL (ref 30.0–36.0)
MCV: 92.4 fL (ref 80.0–100.0)
Platelets: 351 10*3/uL (ref 150–400)
RBC: 4.09 MIL/uL (ref 3.87–5.11)
RDW: 13.9 % (ref 11.5–15.5)
WBC: 13 10*3/uL — ABNORMAL HIGH (ref 4.0–10.5)
nRBC: 0 % (ref 0.0–0.2)

## 2018-05-17 LAB — BASIC METABOLIC PANEL
Anion gap: 8 (ref 5–15)
BUN: 21 mg/dL — ABNORMAL HIGH (ref 6–20)
CO2: 21 mmol/L — ABNORMAL LOW (ref 22–32)
Calcium: 8.9 mg/dL (ref 8.9–10.3)
Chloride: 106 mmol/L (ref 98–111)
Creatinine, Ser: 0.93 mg/dL (ref 0.44–1.00)
GFR calc Af Amer: >60 mL/min (ref >60)
GFR calc non Af Amer: >60 mL/min (ref >60)
Glucose, Bld: 106 mg/dL — ABNORMAL HIGH (ref 70–99)
Potassium: 3.4 mmol/L — ABNORMAL LOW (ref 3.5–5.1)
Sodium: 135 mmol/L (ref 135–145)

## 2018-05-17 LAB — TROPONIN I: Troponin I: <0.03 ng/mL (ref <0.03)

## 2018-05-17 MED ORDER — SODIUM CHLORIDE 0.9% FLUSH: 3.0000 mL | Freq: Once | INTRAVENOUS | Status: DC

## 2018-05-17 MED ORDER — ASPIRIN 81 MG PO CHEW
324.0000 mg | CHEWABLE_TABLET | Freq: Once | ORAL | Status: AC
  Administered 2018-05-17: 324 mg via ORAL
  Filled 2018-05-17: qty 4

## 2018-05-17 MED ORDER — ALUM & MAG HYDROXIDE-SIMETH 200-200-20 MG/5ML PO SUSP
30.0000 mL | Freq: Once | ORAL | Status: AC
  Administered 2018-05-18: 30 mL via ORAL
  Filled 2018-05-17: qty 30

## 2018-05-17 NOTE — ED Provider Notes (Signed)
[facility] EMERGENCY DEPARTMENT Provider Note   CSN: [id number] Arrival date & time: 05/17/18  2058     History   Chief Complaint Chief Complaint  Patient presents with  . Chest Pain    HPI [name] is a 48 y.o. female.  Presenting to the emergency department complaining of chest pressure that began around 8 PM tonight at rest.  She says it is associated with a little bit of lightheadedness and nausea.  She denies being short of breath.  She is had SVT in the past and she says this feels different than that.  She said she is had a stress test that was fine in the past and follows with cardiology here.  She thought she might be getting sick earlier today with a little bit of a cough but that seemed to resolve as the day went along.  She states she has been under a lot of stress.  She was newly put on Topamax about 3 weeks ago.  The history is provided by the patient.  Chest Pain  Pain location:  Substernal area Pain quality: pressure   Pain radiates to:  Does not radiate Pain severity:  Moderate Onset quality:  Sudden Duration:  1 hour Timing:  Constant Progression:  Unchanged Chronicity:  New Context: at rest   Relieved by:  None tried Worsened by:  Nothing Ineffective treatments:  None tried Associated symptoms: cough and nausea   Associated symptoms: no abdominal pain, no diaphoresis, no fever, no headache, no heartburn, no lower extremity edema, no shortness of breath, no syncope and no vomiting   Risk factors: no coronary artery disease and no prior DVT/PE     Past Medical History:  Diagnosis Date  . Anxiety   . ASTHMA   . B12 deficiency   . DELAYED GASTRIC EMPTYING   . DEPRESSION   . GERD   . Glucose intolerance (impaired glucose tolerance) 04/09/2015  . Hypertension   . OBESITY   . OBSTRUCTIVE SLEEP APNEA   . POLYCYSTIC OVARIAN DISEASE   . Prediabetes   . SMOKER   . SOB (shortness of breath)   . SVT (supraventricular tachycardia) ([place])   . Tachycardia    . Umbilical hernia   . Umbilical hernia   . Vitamin B12 deficiency 03/2015 dx   start IM replacement q 30d  . Vitamin D deficiency 03/2015 dx    Patient Active Problem List   Diagnosis Date Noted  . Skin abnormalities 11/25/2017  . 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 Skin tag 11/25/2017  . Essential hypertension 11/01/2017  . Chronic frontal sinusitis 10/30/2017  . Submandibular gland mass 10/30/2017  . Prediabetes 05/18/2017  . SOB (shortness of breath) 09/24/2016  . Palpitations 09/24/2016  . Family history of early CAD 03/23/2016  . SVT (supraventricular tachycardia) ([place]) 03/14/2016  . Adrenal nodule ([place]) 01/06/2016  . Umbilical hernia 95/18/8416  . Morbid obesity ([place]) 04/09/2015  . Vitamin D deficiency   . Vitamin B12 deficiency   . Idiopathic intracranial hypertension 05/24/2013  . Anxiety   . Cigarette smoker 01/28/2009  . Depression 01/28/2009  . GERD 01/28/2009  . POLYCYSTIC OVARIAN DISEASE 02/10/2007  . Class 3 severe obesity with serious comorbidity and body mass index (BMI) of 50.0 to 59.9 in adult ([place]) 02/10/2007  . Obstructive sleep apnea 02/10/2007  . Cough variant asthma 02/10/2007    Past Surgical History:  Procedure Laterality Date  . CESAREAN SECTION  97 & 98   x's 2  .  Cocxyl removal  1995  . DILATION AND CURETTAGE OF UTERUS    . LAPAROSCOPY    . TUBAL LIGATION  1998     OB History    Gravida  3   Para  2   Term  2   Preterm      AB  1   Living        SAB      TAB  1   Ectopic      Multiple      Live Births               Home Medications    Prior to Admission medications   Medication Sig Start Date End Date Taking? Authorizing Provider  ALPRAZolam Duanne Moron) 0.5 MG tablet Take 1 tablet (0.5 mg total) by mouth 3 times/day as needed-between meals & bedtime for anxiety. 05/19/17   Binnie Rail, MD  Cyanocobalamin (B-12 COMPLIANCE INJECTION) 1000 MCG/ML KIT Inject 1,000 mcg as directed every 30 (thirty) days.     [provider]  losartan (COZAAR) 50 MG tablet Take 1 tablet (50 mg total) by mouth daily. 10/31/17   Tanda Rockers, MD  metFORMIN (GLUCOPHAGE) 500 MG tablet Take 1 tablet (500 mg total) by mouth 2 (two) times daily with a meal. 11/24/17   Burns, Claudina Lick, MD  metoprolol tartrate (LOPRESSOR) 25 MG tablet Take 12.5 mg by mouth 2 (two) times daily.    [provider]  pantoprazole (PROTONIX) 40 MG tablet TAKE 1 TABLET BY MOUTH EVERY DAY 05/03/18   Nandigam, Venia Minks, MD  PARoxetine (PAXIL) 20 MG tablet TAKE 1 TABLET BY MOUTH EVERY DAY 12/04/17   Binnie Rail, MD  topiramate (TOPAMAX) 50 MG tablet Take 1 tablet (50 mg total) by mouth at bedtime. 05/02/18   Whitmire, Joneen Boers, FNP  UNABLE TO FIND Med Name: CPAP    [provider]  valACYclovir (VALTREX) 1000 MG tablet TAKE 2 TABLETS EVERY 12 HOURS FOR 1 DAY START ASAP AFTER SYTPTOM ONSET 11/24/17   Binnie Rail, MD  venlafaxine XR (EFFEXOR XR) 37.5 MG 24 hr capsule Take 1 capsule (37.5 mg total) by mouth daily with breakfast. 05/16/18   Whitmire, Joneen Boers, FNP  Vitamin D, Ergocalciferol, (DRISDOL) 1.25 MG (50000 UT) CAPS capsule Take 1 capsule (50,000 Units total) by mouth every 7 (seven) days. 05/16/18   Whitmire, Joneen Boers, FNP    Family History Family History  Problem Relation Age of Onset  . Breast cancer Mother        dx 11/2009  . Diabetes Mother   . High blood pressure Mother   . Thyroid disease Mother   . Anxiety disorder Mother   . Obesity Mother   . Diabetes Maternal Aunt   . Prostate cancer Maternal Grandfather 75       also hx colon polyps  . Heart disease Paternal Grandmother   . Pancreatic cancer Paternal Grandmother 36  . High blood pressure Father   . High Cholesterol Father   . Depression Father   . Heart murmur Sister   . Adrenal disorder Neg Hx     Social History Social History   Tobacco Use  . Smoking status: Former Smoker    Packs/day: 0.25    Years: 30.00    Pack years: 7.50    Types:  Cigarettes    Start date: 10/23/1987    Last attempt to quit: 10/23/2017    Years since quitting: 0.5  . Smokeless  tobacco: Never Used  Substance Use Topics  . Alcohol use: Yes    Alcohol/week: 0.0 standard drinks    Comment: Rarely  . Drug use: No     Allergies   Patient has no known allergies.   Review of Systems Review of Systems  Constitutional: Negative for diaphoresis and fever.  HENT: Negative for sore throat.   Eyes: Negative for visual disturbance.  Respiratory: Positive for cough. Negative for shortness of breath.   Cardiovascular: Positive for chest pain. Negative for syncope.  Gastrointestinal: Positive for nausea. Negative for abdominal pain, heartburn and vomiting.  Genitourinary: Negative for dysuria.  Musculoskeletal: Negative for neck pain.  Skin: Negative for rash.  Neurological: Positive for light-headedness. Negative for headaches.     Physical Exam Updated Vital Signs Ht 5' 5"  (1.651 m)   Wt (!) 156 kg   LMP 04/21/2018   BMI 57.23 kg/m   Physical Exam Vitals signs and nursing note reviewed.  Constitutional:      General: She is not in acute distress.    Appearance: She is well-developed. She is obese.  HENT:     Head: Normocephalic and atraumatic.  Eyes:     Conjunctiva/sclera: Conjunctivae normal.  Neck:     Musculoskeletal: Neck supple.  Cardiovascular:     Rate and Rhythm: Normal rate and regular rhythm.     Heart sounds: No murmur.  Pulmonary:     Effort: Pulmonary effort is normal. No respiratory distress.     Breath sounds: Normal breath sounds. No stridor. No wheezing.  Chest:     Chest wall: No tenderness.  Abdominal:     Palpations: Abdomen is soft.     Tenderness: There is no abdominal tenderness.  Musculoskeletal: Normal range of motion.        General: No tenderness.     Right lower leg: She exhibits no tenderness.     Left lower leg: She exhibits no tenderness.  Skin:    General: Skin is warm and dry.     Capillary  Refill: Capillary refill takes less than 2 seconds.  Neurological:     General: No focal deficit present.     Mental Status: She is alert and oriented to person, place, and time.     GCS: GCS eye subscore is 4. GCS verbal subscore is 5. GCS motor subscore is 6.      ED Treatments / Results  Labs (all labs ordered are listed, but only abnormal results are displayed) Labs Reviewed  BASIC METABOLIC PANEL - Abnormal; Notable for the following components:      Result Value   Potassium 3.4 (*)    CO2 21 (*)    Glucose, Bld 106 (*)    BUN 21 (*)    All other components within normal limits  CBC - Abnormal; Notable for the following components:   WBC 13.0 (*)    Hemoglobin 11.6 (*)    All other components within normal limits  TROPONIN I    EKG EKG Interpretation  Date/Time:  Thursday May 17 2018 21:06:01 EST Ventricular Rate:  78 PR Interval:    QRS Duration: 114 QT Interval:  389 QTC Calculation: 444 R Axis:   34 Text Interpretation:  Sinus rhythm Borderline intraventricular conduction delay Low voltage, precordial leads similar to prior 6/18 Confirmed by Aletta Edouard [phone]) on 05/17/2018 10:06:55 PM   Radiology Dg Chest Port 1 View  Result Date: 05/17/2018 CLINICAL DATA:  48 year old female with midsternal chest pain, shortness of  breath, nausea. EXAM: PORTABLE CHEST 1 VIEW COMPARISON:  09/19/2017 and earlier. FINDINGS: Portable AP upright view at 2116 hours. Stable cardiac size at the upper limits of normal. Other mediastinal contours are within normal limits. Visualized tracheal air column is within normal limits. Stable lung volumes. Allowing for portable technique the lungs are clear. No pneumothorax. Chronic cardiophrenic angle lipomatosis redemonstrated. No acute osseous abnormality identified. IMPRESSION: No acute cardiopulmonary abnormality. Electronically Signed   By: Genevie Ann M.D.   On: 05/17/2018 21:29    Procedures Procedures (including critical care  time)  Medications Ordered in ED Medications  sodium chloride flush (NS) 0.9 % injection 3 mL (has no administration in time range)  aspirin chewable tablet 324 mg (has no administration in time range)     Initial Impression / Assessment and Plan / ED Course  I have reviewed the triage vital signs and the nursing notes.  Pertinent labs & imaging results that were available during my care of the patient were reviewed by me and considered in my medical decision making (see chart for details).  Clinical Course as of May 18 1045  Thu May 17, 3862  6912 48 year old female with no history of coronary artery disease here with about 1 hours worth of central chest pressure.  She said she has had negative stress test in the past.  Her initial EKG is unremarkable and her first troponin is negative.  She was given aspirin and we will put her in for a delta troponin.   [MB]  2208 Initial heart score would be 2.   [MB]    Clinical Course User Index [MB] Hayden Rasmussen, MD     Final Clinical Impressions(s) / ED Diagnoses   Final diagnoses:  Nonspecific chest pain    ED Discharge Orders    None       Hayden Rasmussen, MD 05/18/18 1047

## 2018-05-17 NOTE — Progress Notes (Addendum)
Office: [phone]  /  Fax: [phone]   HPI:   Chief Complaint: OBESITY [name] is here to discuss her progress with her obesity treatment plan. She is on the Category 3 plan and is following her eating plan approximately 20 % of the time. She states she is exercising 0 minutes 0 times per week. [name] has been very stressed with family and work and she was not following her plan until 2 days ago.  Her weight is (!) 344 lb (156 kg) today and has not lost weight since her last visit. She has lost 24 lbs since starting treatment with [place].  Anxiety and Depression with emotional eating behaviors [name] is struggling with stress eating and using food for comfort to the extent that it is negatively impacting her health. She tends to have a great deal of anxiety which can be overwhelming for her. She often snacks when she is not hungry. [name] sometimes feels she is out of control and then feels guilty that she made poor food choices. She has been working on behavior modification techniques to help reduce her emotional eating and has been somewhat successful. [name] declines to see Dr. [name]. She wants to wean off of Paxil due to weight gain. She will speak to her PCP about going off Paxil and its lack of effectiveness. She is on Topamax for cravings and it is effective.   Vitamin D deficiency [name] has a diagnosis of vitamin D deficiency. She is currently taking vit D and is not at goal. She denies nausea, vomiting, or muscle weakness.  At risk for osteopenia and osteoporosis [name] is at higher risk of osteopenia and osteoporosis due to vitamin D deficiency.   ASSESSMENT AND PLAN:  Vitamin D deficiency - Plan: Vitamin D, Ergocalciferol, (DRISDOL) 1.25 MG (50000 UT) CAPS capsule  Anxiety and depression - with emotional eating  At risk for osteoporosis  Class 3 severe obesity with serious comorbidity and body mass index (BMI) of 50.0 to 59.9 in adult, unspecified obesity type ([name])  PLAN:  Vitamin  D Deficiency [name] was informed that low vitamin D levels contributes to fatigue and are associated with obesity, breast, and colon cancer. She agrees to continue to take prescription Vit D _0 ,000 IU every week #4 with no refills and will follow up for routine testing of vitamin D, at least 2-3 times per year. She was informed of the risk of over-replacement of vitamin D and agrees to not increase her dose unless she discusses this with [place] first. [name] agrees to follow up in 2 weeks as directed.  At risk for osteopenia and osteoporosis [name] was given extended (15 minutes) osteoporosis prevention counseling today. [name] is at risk for osteopenia and osteoporosis due to her vitamin D deficiency. She was encouraged to take her vitamin D and follow her higher calcium diet and increase strengthening exercise to help strengthen her bones and decrease her risk of osteopenia and osteoporosis.  Anxiety and Depression with Emotional Eating Behaviors We discussed behavior modification techniques today to help [name] deal with her emotional eating and depression. She has agreed to continue Paxil, Topamax. However, she will speak with PCP about weaning off of Paxil.  She agrees to  start venlafaxine XR 37.7m qd #30 with no refills and agreed to follow up as directed in 2 weeks.  Obesity [name] is currently in the action stage of change. As such, her goal is to continue with weight loss efforts. She has agreed to follow the Category 3  plan. We discussed the following Behavioral Modification Strategies today: increasing lean protein intake and planning for success.  [name] has agreed to follow up with our clinic in 2 weeks. She was informed of the importance of frequent follow up visits to maximize her success with intensive lifestyle modifications for her multiple health conditions.  ALLERGIES: No Known Allergies  MEDICATIONS: Current Outpatient Medications on File Prior to Visit  Medication Sig Dispense Refill  .  ALPRAZolam (XANAX) 0.5 MG tablet Take 1 tablet (0.5 mg total) by mouth 3 times/day as needed-between meals & bedtime for anxiety. 30 tablet 0  . Cyanocobalamin (B-12 COMPLIANCE INJECTION) 1000 MCG/ML KIT Inject 1,000 mcg as directed every 30 (thirty) days.    [name] losartan (COZAAR) 50 MG tablet Take 1 tablet (50 mg total) by mouth daily. 30 tablet 11  . metFORMIN (GLUCOPHAGE) 500 MG tablet Take 1 tablet (500 mg total) by mouth 2 (two) times daily with a meal. 180 tablet 3  . metoprolol tartrate (LOPRESSOR) 25 MG tablet Take 12.5 mg by mouth 2 (two) times daily.    . pantoprazole (PROTONIX) 40 MG tablet TAKE 1 TABLET BY MOUTH EVERY DAY 90 tablet 3  . PARoxetine (PAXIL) 20 MG tablet TAKE 1 TABLET BY MOUTH EVERY DAY 90 tablet 1  . topiramate (TOPAMAX) 50 MG tablet Take 1 tablet (50 mg total) by mouth at bedtime. 30 tablet 0  . UNABLE TO FIND Med Name: CPAP    . valACYclovir (VALTREX) 1000 MG tablet TAKE 2 TABLETS EVERY 12 HOURS FOR 1 DAY START ASAP AFTER SYTPTOM ONSET 6 tablet 2   No current facility-administered medications on file prior to visit.     PAST MEDICAL HISTORY: Past Medical History:  Diagnosis Date  . Anxiety   . ASTHMA   . B12 deficiency   . DELAYED GASTRIC EMPTYING   . DEPRESSION   . GERD   . Glucose intolerance (impaired glucose tolerance) 04/09/2015  . Hypertension   . OBESITY   . OBSTRUCTIVE SLEEP APNEA   . POLYCYSTIC OVARIAN DISEASE   . Prediabetes   . SMOKER   . SOB (shortness of breath)   . SVT (supraventricular tachycardia) ([place])   . Tachycardia   . Umbilical hernia   . Umbilical hernia   . Vitamin B12 deficiency 03/2015 dx   start IM replacement q 30d  . Vitamin D deficiency 03/2015 dx    PAST SURGICAL HISTORY: Past Surgical History:  Procedure Laterality Date  . CESAREAN SECTION  97 & 98   x's 2  . Cocxyl removal  1995  . DILATION AND CURETTAGE OF UTERUS    . LAPAROSCOPY    . TUBAL LIGATION  1998    SOCIAL HISTORY: Social History   Tobacco Use    . Smoking status: Former Smoker    Packs/day: 0.25    Years: 30.00    Pack years: 7.50    Types: Cigarettes    Start date: 10/23/1987    Last attempt to quit: 10/23/2017    Years since quitting: 0.5  . Smokeless tobacco: Never Used  Substance Use Topics  . Alcohol use: Yes    Alcohol/week: 0.0 standard drinks    Comment: Rarely  . Drug use: No    FAMILY HISTORY: Family History  Problem Relation Age of Onset  . Breast cancer Mother        dx 11/2009  . Diabetes Mother   . High blood pressure Mother   . Thyroid disease Mother   .  Anxiety disorder Mother   . Obesity Mother   . Diabetes Maternal Aunt   . Prostate cancer Maternal Grandfather 58       also hx colon polyps  . Heart disease Paternal Grandmother   . Pancreatic cancer Paternal Grandmother 28  . High blood pressure Father   . High Cholesterol Father   . Depression Father   . Heart murmur Sister   . Adrenal disorder Neg Hx     ROS: Review of Systems  Constitutional: Negative for weight loss.  Gastrointestinal: Negative for nausea and vomiting.  Musculoskeletal:       Negative for muscle weakness.  Psychiatric/Behavioral: Positive for depression. The patient is nervous/anxious.     PHYSICAL EXAM: Blood pressure 107/68, pulse 65, temperature 97.8 F (36.6 C), temperature source Oral, height _0  (1.651 m), weight (!) 344 lb (156 kg), last menstrual period 04/21/2017, SpO2 98 %. Body mass index is 57.24 kg/m. Physical Exam Vitals signs reviewed.  Constitutional:      Appearance: Normal appearance. She is obese.  Cardiovascular:     Rate and Rhythm: Normal rate.  Pulmonary:     Effort: Pulmonary effort is normal.  Musculoskeletal: Normal range of motion.  Skin:    General: Skin is warm and dry.  Neurological:     Mental Status: She is alert and oriented to person, place, and time.  Psychiatric:        Mood and Affect: Mood normal.        Behavior: Behavior normal.     RECENT LABS AND  TESTS: BMET    Component Value Date/Time   NA 139 02/14/2018 1146   K 4.7 02/14/2018 1146   CL 102 02/14/2018 1146   CO2 19 (L) 02/14/2018 1146   GLUCOSE 82 02/14/2018 1146   GLUCOSE 93 11/24/2017 1527   BUN 12 02/14/2018 1146   CREATININE 0.90 02/14/2018 1146   CALCIUM 8.9 02/14/2018 1146   GFRNONAA 77 02/14/2018 1146   GFRAA 89 02/14/2018 1146   Lab Results  Component Value Date   HGBA1C 5.7 (H) 02/14/2018   HGBA1C 6.1 11/24/2017   HGBA1C 5.9 05/19/2017   HGBA1C 5.9 05/25/2016   HGBA1C 5.9 10/14/2015   Lab Results  Component Value Date   INSULIN 19.1 02/14/2018   CBC    Component Value Date/Time   WBC 7.7 05/19/2017 1202   RBC 4.18 05/19/2017 1202   HGB 12.4 05/19/2017 1202   HCT 36.6 05/19/2017 1202   PLT 386 05/19/2017 1202   MCV 87.6 05/19/2017 1202   MCH 29.7 05/19/2017 1202   MCHC 33.9 05/19/2017 1202   RDW 12.9 05/19/2017 1202   LYMPHSABS 2,225 05/19/2017 1202   MONOABS 0.8 09/23/2016 0301   EOSABS 123 05/19/2017 1202   BASOSABS 31 05/19/2017 1202   Iron/TIBC/Ferritin/ %Sat No results found for: IRON, TIBC, FERRITIN, IRONPCTSAT Lipid Panel     Component Value Date/Time   CHOL 201 (H) 11/24/2017 1527   TRIG 117.0 11/24/2017 1527   HDL 45.30 11/24/2017 1527   CHOLHDL 4 11/24/2017 1527   VLDL 23.4 11/24/2017 1527   LDLCALC 133 (H) 11/24/2017 1527   LDLDIRECT 154.1 08/16/2012 0828   Hepatic Function Panel     Component Value Date/Time   PROT 7.2 02/14/2018 1146   ALBUMIN 4.1 02/14/2018 1146   AST 6 02/14/2018 1146   ALT 12 02/14/2018 1146   ALKPHOS 85 02/14/2018 1146   BILITOT 0.3 02/14/2018 1146   BILIDIR 0.0 12/12/2012 1144  Component Value Date/Time   TSH 2.480 02/14/2018 1146   TSH 2.94 10/27/2017 1353   TSH 1.80 05/19/2017 1011    OBESITY BEHAVIORAL INTERVENTION VISIT  Today's visit was # 7   Starting weight: 368 lbs Starting date: 02/14/18 Today's weight : Weight: (!) 344 lb (156 kg)  Today's date: 05/16/2018 Total lbs  lost to date: 24  ASK: We discussed the diagnosis of obesity with [name] today and [name] agreed to give [place] permission to discuss obesity behavioral modification therapy today.  ASSESS: Greenlee has the diagnosis of obesity and her BMI today is 57.2. [place]aly is in the action stage of change.   ADVISE: Shannin was educated on the multiple health risks of obesity as well as the benefit of weight loss to improve her health. She was advised of the need for long term treatment and the importance of lifestyle modifications to improve her current health and to decrease her risk of future health problems.  AGREE: Multiple dietary modification options and treatment options were discussed and Doni agreed to follow the recommendations documented in the above note.  ARRANGE: Leonda was educated on the importance of frequent visits to treat obesity as outlined per CMS and USPSTF guidelines and agreed to schedule her next follow up appointment today.  I, Marcille Blanco, CMA, am acting as Location manager for [employer], FNP-C.  I have reviewed the above documentation for accuracy and completeness, and I agree with the above.  -  , FNP-C.

## 2018-05-17 NOTE — Discharge Instructions (Addendum)
You were evaluated in the emergency department for chest pressure.  You had blood work EKG and chest x-ray that did not show an obvious cause of your symptoms.  It will be important for you to contact your cardiologist for close follow-up. Keep your appointment with him later today. Please continue to take it aspirin a day along with your regular medications.  If your symptoms worsen please return to the emergency department.

## 2018-05-17 NOTE — ED Notes (Signed)
Dr Wyvonnia Dusky at bedside,

## 2018-05-17 NOTE — ED Triage Notes (Signed)
Pt states chest pain since 8:20pm pt reports midsternal chest pain with SOB/nausea/and pain between shoulder blades

## 2018-05-17 NOTE — ED Notes (Signed)
Pt and family updated on plan of care,

## 2018-05-17 NOTE — ED Notes (Signed)
Lab at bedside for repeat trop level

## 2018-05-17 NOTE — ED Provider Notes (Signed)
Care assumed from Dr. Melina Copa at 2345.  Patient with central chest pressure since 8pm. EKG reassuring. First troponin negative.  Awaiting second troponin.   Patient had negative stress test in January 2018. PERC negative.  LFTs and lipase are normal.  Patient states that her chest pain and pressure is improving and is somewhat worse when she changes position in bed and turns her torso.  It is also worse when she moves her arm.  ACS seems unlikely but will check second enzyme.  Serial troponins negative.  Patient feeling improved.  Discussed she is low risk for ACS but not 0 risk.  Recommend follow-up with her cardiologist.  She states she in fact has an appointment later today at 3 PM.  She is advised to keep this appointment.  Return to the ED with exertional chest pain, shortness of breath, vomiting, diaphoresis or other concerns.   Ezequiel Essex, MD 05/18/18 [phone]

## 2018-05-18 ENCOUNTER — Encounter: Payer: Self-pay | Admitting: Cardiovascular Disease

## 2018-05-18 ENCOUNTER — Ambulatory Visit (INDEPENDENT_AMBULATORY_CARE_PROVIDER_SITE_OTHER): Payer: BLUE CROSS/BLUE SHIELD | Admitting: Cardiovascular Disease

## 2018-05-18 ENCOUNTER — Ambulatory Visit: Payer: Self-pay

## 2018-05-18 VITALS — BP 118/66 | HR 71 | Ht 65.0 in | Wt 349.0 lb

## 2018-05-18 DIAGNOSIS — R079 Chest pain, unspecified: Secondary | ICD-10-CM | POA: Diagnosis not present

## 2018-05-18 DIAGNOSIS — I10 Essential (primary) hypertension: Secondary | ICD-10-CM | POA: Diagnosis not present

## 2018-05-18 DIAGNOSIS — Z9289 Personal history of other medical treatment: Secondary | ICD-10-CM

## 2018-05-18 DIAGNOSIS — G4730 Sleep apnea, unspecified: Secondary | ICD-10-CM

## 2018-05-18 DIAGNOSIS — I471 Supraventricular tachycardia: Secondary | ICD-10-CM | POA: Diagnosis not present

## 2018-05-18 LAB — HEPATIC FUNCTION PANEL
ALT: 13 U/L (ref 0–44)
AST: 11 U/L — ABNORMAL LOW (ref 15–41)
Albumin: 4 g/dL (ref 3.5–5.0)
Alkaline Phosphatase: 72 U/L (ref 38–126)
Bilirubin, Direct: <0.1 mg/dL (ref 0.0–0.2)
Total Bilirubin: 0.3 mg/dL (ref 0.3–1.2)
Total Protein: 7.9 g/dL (ref 6.5–8.1)

## 2018-05-18 LAB — LIPASE, BLOOD: Lipase: 37 U/L (ref 11–51)

## 2018-05-18 LAB — TROPONIN I
Troponin I: <0.03 ng/mL (ref <0.03)
Troponin I: <0.03 ng/mL (ref <0.03)

## 2018-05-18 MED ORDER — NITROGLYCERIN 0.4 MG SL SUBL: 0.4000 mg | SUBLINGUAL_TABLET | Freq: Once | SUBLINGUAL | Status: DC

## 2018-05-18 NOTE — ED Notes (Signed)
Pt updated on plan of care, lab at bedside for repeat trop,

## 2018-05-18 NOTE — ED Notes (Signed)
Pt reports that her chest pressure is unchanged,

## 2018-05-18 NOTE — Progress Notes (Signed)
SUBJECTIVE: The patient presents for routine follow-up.  She was evaluated for chest pain in the ED last night.  Troponins were normal.  It appeared to be positional in nature.    Chest x-ray showed no acute cardiopulmonary abnormalities.  I personally reviewed the ECG which showed sinus rhythm with a nonspecific IVCD.  I follow her for paroxysmal SVT.  She has a long history of noncardiac chest pain as well as morbid obesity, severe sleep apnea, and hypertension.  She has been short of breath since early last year.  She has been tried on different medications but nothing has alleviated her symptoms.  She has even lost 24 pounds but continues to be short of breath at rest and with exertion.  Last night she developed chest pressure and he just got off of work.  When she received aspirin in the ED she got some relief but the symptoms recurred this morning.  She says symptoms are not similar to her usual GERD symptoms nor do they represent anxiety/panic attacks.  She had some cold chills this morning and some nausea and a cough.    Review of Systems: As per "subjective", otherwise negative.  No Known Allergies  Current Outpatient Medications  Medication Sig Dispense Refill  . ALPRAZolam (XANAX) 0.5 MG tablet Take 1 tablet (0.5 mg total) by mouth 3 times/day as needed-between meals & bedtime for anxiety. 30 tablet 0  . Cyanocobalamin (B-12 COMPLIANCE INJECTION) 1000 MCG/ML KIT Inject 1,000 mcg as directed every 30 (thirty) days.    [name] losartan (COZAAR) 50 MG tablet Take 1 tablet (50 mg total) by mouth daily. 30 tablet 11  . metFORMIN (GLUCOPHAGE) 500 MG tablet Take 1 tablet (500 mg total) by mouth 2 (two) times daily with a meal. 180 tablet 3  . metoprolol tartrate (LOPRESSOR) 25 MG tablet Take 12.5 mg by mouth 2 (two) times daily.    . pantoprazole (PROTONIX) 40 MG tablet TAKE 1 TABLET BY MOUTH EVERY DAY 90 tablet 3  . PARoxetine (PAXIL) 20 MG tablet TAKE 1 TABLET BY MOUTH EVERY DAY  90 tablet 1  . topiramate (TOPAMAX) 50 MG tablet Take 1 tablet (50 mg total) by mouth at bedtime. 30 tablet 0  . UNABLE TO FIND Med Name: CPAP    . valACYclovir (VALTREX) 1000 MG tablet TAKE 2 TABLETS EVERY 12 HOURS FOR 1 DAY START ASAP AFTER SYTPTOM ONSET 6 tablet 2  . Vitamin D, Ergocalciferol, (DRISDOL) 1.25 MG (50000 UT) CAPS capsule Take 1 capsule (50,000 Units total) by mouth every 7 (seven) days. 4 capsule 0  . venlafaxine XR (EFFEXOR XR) 37.5 MG 24 hr capsule Take 1 capsule (37.5 mg total) by mouth daily with breakfast. (Patient not taking: Reported on 05/18/2018) 30 capsule 0   No current facility-administered medications for this visit.     Past Medical History:  Diagnosis Date  . Anxiety   . ASTHMA   . B12 deficiency   . DELAYED GASTRIC EMPTYING   . DEPRESSION   . GERD   . Glucose intolerance (impaired glucose tolerance) 04/09/2015  . Hypertension   . OBESITY   . OBSTRUCTIVE SLEEP APNEA   . POLYCYSTIC OVARIAN DISEASE   . Prediabetes   . SMOKER   . SOB (shortness of breath)   . SVT (supraventricular tachycardia) ([place])   . Tachycardia   . Umbilical hernia   . Umbilical hernia   . Vitamin B12 deficiency 03/2015 dx   start IM replacement q 30d  .  Vitamin D deficiency 03/2015 dx    Past Surgical History:  Procedure Laterality Date  . CESAREAN SECTION  97 & 98   x's 2  . Cocxyl removal  1995  . DILATION AND CURETTAGE OF UTERUS    . LAPAROSCOPY    . TUBAL LIGATION  1998    Social History   Socioeconomic History  . Marital status: Married    Spouse name: Anaisabel Pederson  . Number of children: 2  . Years of education: Not on file  . Highest education level: Not on file  Occupational History  . 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 Financial resource strain: Not on file  . Food insecurity:    Worry: Not on file    Inability: Not on file  . Transportation needs:    Medical: Not on file    Non-medical: Not on file   Tobacco Use  . Smoking status: Former Smoker    Packs/day: 0.25    Years: 30.00    Pack years: 7.50    Types: Cigarettes    Start date: 10/23/1987    Last attempt to quit: 10/23/2017    Years since quitting: 0.5  . Smokeless tobacco: Never Used  Substance and Sexual Activity  . Alcohol use: Yes    Alcohol/week: 0.0 standard drinks    Comment: Rarely  . Drug use: No  . Sexual activity: Not on file  Lifestyle  . Physical activity:    Days per week: Not on file    Minutes per session: Not on file  . Stress: Not on file  Relationships  . Social connections:    Talks on phone: Not on file    Gets together: Not on file    Attends religious service: Not on file    Active member of club or organization: Not on file    Attends meetings of clubs or organizations: Not on file    Relationship status: Not on file  . Intimate partner violence:    Fear of current or ex partner: Not on file    Emotionally abused: Not on file    Physically abused: Not on file    Forced sexual activity: Not on file  Other Topics Concern  . Not on file  Social History Narrative   Lives with husband & 2 kids from 47st marriage. (1st husband expired 1998-leukemia) work as Merchandiser, retail, but currently unemployed     Vitals:   05/18/18 1457  BP: 118/66  Pulse: 71  SpO2: 98%  Weight: (!) 349 lb (158.3 kg)  Height: 5' 5" (1.651 m)    Wt Readings from Last 3 Encounters:  05/18/18 (!) 349 lb (158.3 kg)  05/17/18 (!) 343 lb 14.7 oz (156 kg)  05/16/18 (!) 344 lb (156 kg)     PHYSICAL EXAM General: NAD HEENT: Normal. Neck: No JVD, no thyromegaly. Lungs: Clear to auscultation bilaterally with normal respiratory effort. CV: Regular rate and rhythm, normal S1/S2, no S3/S4, no murmur. No pretibial or periankle edema.    Abdomen: Soft, nontender, obese.  Neurologic: Alert and oriented.  Psych: Normal affect. Skin: Normal. Musculoskeletal: No gross deformities.    ECG: Reviewed above under  Subjective   Labs: Lab Results  Component Value Date/Time   K 3.4 (L) 05/17/2018 09:12 PM   BUN 21 (H) 05/17/2018 09:12 PM   BUN 12 02/14/2018 11:46 AM   CREATININE 0.93 05/17/2018 09:12  PM   ALT 13 05/17/2018 09:12 PM   TSH 2.480 02/14/2018 11:46 AM   HGB 11.6 (L) 05/17/2018 09:12 PM     Lipids: Lab Results  Component Value Date/Time   LDLCALC 133 (H) 11/24/2017 03:27 PM   LDLDIRECT 154.1 08/16/2012 08:28 AM   CHOL 201 (H) 11/24/2017 03:27 PM   TRIG 117.0 11/24/2017 03:27 PM   HDL 45.30 11/24/2017 03:27 PM       ASSESSMENT AND PLAN: 1.  Chest pain and shortness of breath: Normal nuclear stress test on 04/27/2016.  Symptoms have been ongoing since early 2019 and she said symptoms are very unlike her GERD or anxiety/panic attack symptoms.  Cardiac risk factors include type 2 diabetes.  I will arrange for coronary CT angiography.  2.  Paroxysmal supraventricular tachycardia: No recurrences.  Continue Lopressor 12.5 mg twice daily.  3.  Severe sleep apnea: She is on CPAP.  4.  Hypertension: Blood pressure is normal.  No changes to therapy.    Disposition: Follow up 3 months   Kate Sable, M.D., F.A.C.C.

## 2018-05-18 NOTE — Patient Instructions (Addendum)
Please arrive at the [facility] main entrance of [facility] at   AM (30-45 minutes prior to test start time)  [facility] [place], [place] [address] [phone]  Proceed to the [facility] Radiology Department (First Floor).  Please follow these instructions carefully (unless otherwise directed):    On the Night Before the Test: . Be sure to Drink plenty of water. . Do not consume any caffeinated/decaffeinated beverages or chocolate 12 hours prior to your test. . Do not take any antihistamines 12 hours prior to your test. . If you take Metformin do not take 24 hours prior to test. On the Day of the Test: . Drink plenty of water. Do not drink any water within one hour of the test. . Do not eat any food 4 hours prior to the test. . You may take your regular medications prior to the test.  . Take metoprolol (Lopressor) 100 mg (you will take 4 tablets of your 25 mg pills)  two hours prior to test.  After the Test: . Drink plenty of water. . After receiving IV contrast, you may experience a mild flushed feeling. This is normal. . On occasion, you may experience a mild rash up to 24 hours after the test. This is not dangerous. If this occurs, you can take Benadryl 25 mg and increase your fluid intake. . If you experience trouble breathing, this can be serious. If it is severe call 911 IMMEDIATELY. If it is mild, please call our office. . If you take any of these medications: Glipizide/Metformin, Avandament, Glucavance, please do not take 48 hours after completing test.

## 2018-05-18 NOTE — ED Notes (Signed)
Dr Wyvonnia Dusky at bedside,

## 2018-05-26 ENCOUNTER — Encounter (INDEPENDENT_AMBULATORY_CARE_PROVIDER_SITE_OTHER): Payer: Self-pay | Admitting: Family Medicine

## 2018-05-26 ENCOUNTER — Other Ambulatory Visit (INDEPENDENT_AMBULATORY_CARE_PROVIDER_SITE_OTHER): Payer: Self-pay | Admitting: Family Medicine

## 2018-05-26 DIAGNOSIS — F418 Other specified anxiety disorders: Secondary | ICD-10-CM

## 2018-05-27 ENCOUNTER — Other Ambulatory Visit: Payer: Self-pay | Admitting: Internal Medicine

## 2018-05-30 ENCOUNTER — Ambulatory Visit (INDEPENDENT_AMBULATORY_CARE_PROVIDER_SITE_OTHER): Payer: Self-pay | Admitting: Family Medicine

## 2018-06-01 ENCOUNTER — Ambulatory Visit (INDEPENDENT_AMBULATORY_CARE_PROVIDER_SITE_OTHER)
Admission: RE | Admit: 2018-06-01 | Discharge: 2018-06-01 | Disposition: A | Discharge status: Home / Self Care | Payer: BLUE CROSS/BLUE SHIELD | Source: Ambulatory Visit | Attending: Family | Admitting: Family

## 2018-06-01 ENCOUNTER — Encounter: Payer: Self-pay | Admitting: Family

## 2018-06-01 ENCOUNTER — Other Ambulatory Visit (INDEPENDENT_AMBULATORY_CARE_PROVIDER_SITE_OTHER): Payer: BLUE CROSS/BLUE SHIELD

## 2018-06-01 ENCOUNTER — Ambulatory Visit: Payer: Self-pay | Admitting: *Deleted

## 2018-06-01 ENCOUNTER — Ambulatory Visit (INDEPENDENT_AMBULATORY_CARE_PROVIDER_SITE_OTHER): Payer: BLUE CROSS/BLUE SHIELD | Admitting: Family

## 2018-06-01 VITALS — BP 120/76 | HR 68 | Temp 97.5°F | Ht 65.0 in | Wt 352.0 lb

## 2018-06-01 DIAGNOSIS — R8290 Unspecified abnormal findings in urine: Secondary | ICD-10-CM

## 2018-06-01 DIAGNOSIS — R0789 Other chest pain: Secondary | ICD-10-CM | POA: Diagnosis not present

## 2018-06-01 DIAGNOSIS — R252 Cramp and spasm: Secondary | ICD-10-CM | POA: Diagnosis not present

## 2018-06-01 DIAGNOSIS — R42 Dizziness and giddiness: Secondary | ICD-10-CM | POA: Diagnosis not present

## 2018-06-01 DIAGNOSIS — E538 Deficiency of other specified B group vitamins: Secondary | ICD-10-CM

## 2018-06-01 DIAGNOSIS — R110 Nausea: Secondary | ICD-10-CM

## 2018-06-01 LAB — COMPREHENSIVE METABOLIC PANEL
ALT: 13 U/L (ref 0–35)
AST: 6 U/L (ref 0–37)
Albumin: 4.1 g/dL (ref 3.5–5.2)
Alkaline Phosphatase: 81 U/L (ref 39–117)
BUN: 19 mg/dL (ref 6–23)
CO2: 24 mEq/L (ref 19–32)
Calcium: 9.1 mg/dL (ref 8.4–10.5)
Chloride: 105 mEq/L (ref 96–112)
Creatinine, Ser: 1.12 mg/dL (ref 0.40–1.20)
GFR: 52.28 mL/min — ABNORMAL LOW (ref >60.00)
Glucose, Bld: 95 mg/dL (ref 70–99)
Potassium: 4 mEq/L (ref 3.5–5.1)
Sodium: 138 mEq/L (ref 135–145)
Total Bilirubin: 0.2 mg/dL (ref 0.2–1.2)
Total Protein: 7.5 g/dL (ref 6.0–8.3)

## 2018-06-01 LAB — CBC WITH DIFFERENTIAL/PLATELET
Basophils Absolute: 0.1 10*3/uL (ref 0.0–0.1)
Basophils Relative: 0.8 % (ref 0.0–3.0)
Eosinophils Absolute: 0.2 10*3/uL (ref 0.0–0.7)
Eosinophils Relative: 1.5 % (ref 0.0–5.0)
HCT: 36.5 % (ref 36.0–46.0)
Hemoglobin: 12.2 g/dL (ref 12.0–15.0)
Lymphocytes Relative: 25.3 % (ref 12.0–46.0)
Lymphs Abs: 2.8 10*3/uL (ref 0.7–4.0)
MCHC: 33.5 g/dL (ref 30.0–36.0)
MCV: 89.4 fl (ref 78.0–100.0)
Monocytes Absolute: 0.5 10*3/uL (ref 0.1–1.0)
Monocytes Relative: 4.4 % (ref 3.0–12.0)
Neutro Abs: 7.4 10*3/uL (ref 1.4–7.7)
Neutrophils Relative %: 68 % (ref 43.0–77.0)
Platelets: 378 10*3/uL (ref 150.0–400.0)
RBC: 4.08 Mil/uL (ref 3.87–5.11)
RDW: 14.3 % (ref 11.5–15.5)
WBC: 10.9 10*3/uL — ABNORMAL HIGH (ref 4.0–10.5)

## 2018-06-01 LAB — URINALYSIS
Bilirubin Urine: NEGATIVE
Hgb urine dipstick: NEGATIVE
Ketones, ur: NEGATIVE
Leukocytes,Ua: NEGATIVE
Nitrite: NEGATIVE
Specific Gravity, Urine: 1.025 (ref 1.000–1.030)
Total Protein, Urine: NEGATIVE
Urine Glucose: NEGATIVE
Urobilinogen, UA: 0.2 (ref 0.0–1.0)
pH: 6 (ref 5.0–8.0)

## 2018-06-01 LAB — MAGNESIUM: Magnesium: 1.8 mg/dL (ref 1.5–2.5)

## 2018-06-01 MED ORDER — CYANOCOBALAMIN 1000 MCG/ML IJ SOLN
1000.0000 ug | Freq: Once | INTRAMUSCULAR | Status: AC
  Administered 2018-06-01: 1000 ug via INTRAMUSCULAR

## 2018-06-01 NOTE — Telephone Encounter (Signed)
Low reads this morning- BP- 85/62 P 54  Patient is feeling dizziness and headache this week.  Patient has been taking Topamax for about 1 month now. (Her daughter had taken it before and got acidosis.) patient is not drinking as much as she should.  136/97 P 73  145/75 P 72 -sitting 105/58 P 71- laying- and head will hurt more  Headache present. Patient is taking BP medication and she has not missed doses of medication.  Reason for Disposition . Brief (now gone) dizziness or lightheadedness after standing up or eating  Answer Assessment - Initial Assessment Questions 1. BLOOD PRESSURE: "What is the blood pressure?" "Did you take at least two measurements 5 minutes apart?"     136/97 2. ONSET: "When did you take your blood pressure?"     10:45 3. HOW: "How did you obtain the blood pressure?" (e.g., visiting nurse, automatic home BP monitor)     automatic cuff 4. HISTORY: "Do you have a history of low blood pressure?" "What is your blood pressure normally?"     No- hypertension 5. MEDICATIONS: "Are you taking any medications for blood pressure?" If yes: "Have they been changed recently?"     Yes- took BP medication 1 1/2 hours ago 6. PULSE RATE: "Do you know what your pulse rate is?"      73 7. OTHER SYMPTOMS: "Have you been sick recently?" "Have you had a recent injury?"     Patient has been have cardiac symptoms and was seen at ED- testing is scheduled 8. PREGNANCY: "Is there any chance you are pregnant?" "When was your last menstrual period?"     n/a  Protocols used: LOW BLOOD PRESSURE-A-AH

## 2018-06-01 NOTE — Progress Notes (Signed)
[name] is a 48 y.o. female with the following history as recorded in EpicCare:  Patient Active Problem List   Diagnosis Date Noted  . Skin abnormalities 11/25/2017  . 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 Skin tag 11/25/2017  . Essential hypertension 11/01/2017  . Chronic frontal sinusitis 10/30/2017  . Submandibular gland mass 10/30/2017  . Prediabetes 05/18/2017  . SOB (shortness of breath) 09/24/2016  . Palpitations 09/24/2016  . Family history of early CAD 03/23/2016  . SVT (supraventricular tachycardia) ([place]) 03/14/2016  . Adrenal nodule ([place]) 01/06/2016  . Umbilical hernia 25/08/3974  . Morbid obesity ([place]) 04/09/2015  . Vitamin D deficiency   . Vitamin B12 deficiency   . Idiopathic intracranial hypertension 05/24/2013  . Anxiety   . Cigarette smoker 01/28/2009  . Depression 01/28/2009  . GERD 01/28/2009  . POLYCYSTIC OVARIAN DISEASE 02/10/2007  . Class 3 severe obesity with serious comorbidity and body mass index (BMI) of 50.0 to 59.9 in adult ([place]) 02/10/2007  . Obstructive sleep apnea 02/10/2007  . Cough variant asthma 02/10/2007    Current Outpatient Medications  Medication Sig Dispense Refill  . ALPRAZolam (XANAX) 0.5 MG tablet Take 1 tablet (0.5 mg total) by mouth 3 times/day as needed-between meals & bedtime for anxiety. 30 tablet 0  . Cyanocobalamin (B-12 COMPLIANCE INJECTION) 1000 MCG/ML KIT Inject 1,000 mcg as directed every 30 (thirty) days.    [name] losartan (COZAAR) 50 MG tablet Take 1 tablet (50 mg total) by mouth daily. 30 tablet 11  . metFORMIN (GLUCOPHAGE) 500 MG tablet Take 1 tablet (500 mg total) by mouth 2 (two) times daily with a meal. 180 tablet 3  . metoprolol tartrate (LOPRESSOR) 25 MG tablet Take 12.5 mg by mouth 2 (two) times daily.    . pantoprazole (PROTONIX) 40 MG tablet TAKE 1 TABLET BY MOUTH EVERY DAY 90 tablet 3  . PARoxetine (PAXIL) 20 MG tablet TAKE 1 TABLET BY MOUTH EVERY DAY 90 tablet 1  . topiramate (TOPAMAX) 50 MG tablet Take 1  tablet (50 mg total) by mouth at bedtime. 30 tablet 0  . UNABLE TO FIND Med Name: CPAP    . valACYclovir (VALTREX) 1000 MG tablet TAKE 2 TABLETS EVERY 12 HOURS FOR 1 DAY START ASAP AFTER SYTPTOM ONSET 6 tablet 2  . Vitamin D, Ergocalciferol, (DRISDOL) 1.25 MG (50000 UT) CAPS capsule Take 1 capsule (50,000 Units total) by mouth every 7 (seven) days. 4 capsule 0   No current facility-administered medications for this visit.     Allergies: Patient has no known allergies.  Past Medical History:  Diagnosis Date  . Anxiety   . ASTHMA   . B12 deficiency   . DELAYED GASTRIC EMPTYING   . DEPRESSION   . GERD   . Glucose intolerance (impaired glucose tolerance) 04/09/2015  . Hypertension   . OBESITY   . OBSTRUCTIVE SLEEP APNEA   . POLYCYSTIC OVARIAN DISEASE   . Prediabetes   . SMOKER   . SOB (shortness of breath)   . SVT (supraventricular tachycardia) ([place])   . Tachycardia   . Umbilical hernia   . Umbilical hernia   . Vitamin B12 deficiency 03/2015 dx   start IM replacement q 30d  . Vitamin D deficiency 03/2015 dx    Past Surgical History:  Procedure Laterality Date  . CESAREAN SECTION  97 & 98   x's 2  . Cocxyl removal  1995  . DILATION AND CURETTAGE OF UTERUS    . LAPAROSCOPY    .  TUBAL LIGATION  1998    Family History  Problem Relation Age of Onset  . Breast cancer Mother        dx 11/2009  . Diabetes Mother   . High blood pressure Mother   . Thyroid disease Mother   . Anxiety disorder Mother   . Obesity Mother   . Diabetes Maternal Aunt   . Prostate cancer Maternal Grandfather 59       also hx colon polyps  . Heart disease Paternal Grandmother   . Pancreatic cancer Paternal Grandmother 36  . High blood pressure Father   . High Cholesterol Father   . Depression Father   . Heart murmur Sister   . Adrenal disorder Neg Hx     Social History   Tobacco Use  . Smoking status: Former Smoker    Packs/day: 0.25    Years: 30.00    Pack years: 7.50    Types: Cigarettes     Start date: 10/23/1987    Last attempt to quit: 10/23/2017    Years since quitting: 0.6  . Smokeless tobacco: Never Used  Substance Use Topics  . Alcohol use: Yes    Alcohol/week: 0.0 standard drinks    Comment: Rarely    Subjective:  Patient presents with concerns for her blood pressure and pulse fluctuating with changing positions x 1 week; more noticeable when lying down; has been feeling nauseated/ "light-headed" for the past week; history of SVT; has noticed that her pulse has been low this past week- feels like pulse averaging 76; went to ER earlier this month with chest tightness- no abnormality noted/ saw cardiology in follow-up and calcium CT tests are pending; at ER earlier this month, WBC was slightly elevated at 13;  Takes Losartan 50 mg and 12.5 mg Metoprolol bid- this regimen stable x years;  Newest medication added was Topamax in the past month; notes that daughter could not tolerated Topamax;  Per patient has had chills but no fever for the past week;  Patient admits she has not been eating/ drinking this past week;      Objective:  Vitals:   06/01/18 1516 06/01/18 1529  BP: (!) 142/86 120/76  Pulse: 68   Temp: (!) 97.5 F (36.4 C)   TempSrc: Oral   SpO2: 98%   Weight: (!) 352 lb (159.7 kg)   Height: _0  (1.651 m)     General: Well developed, well nourished, in no acute distress; pale appearing Skin : Warm and dry.  Head: Normocephalic and atraumatic  Eyes: Sclera and conjunctiva clear; pupils round and reactive to light; extraocular movements intact  Ears: External normal; canals clear; tympanic membranes normal  Oropharynx: Pink, supple. No suspicious lesions  Neck: Supple without thyromegaly, adenopathy  Lungs: Respirations unlabored; clear to auscultation bilaterally without wheeze, rales, rhonchi  CVS exam: normal rate and regular rhythm.  Neurologic: Alert and oriented; speech intact; face symmetrical; moves all extremities well; CNII-XII intact  without focal deficit   Assessment:  1. Nausea   2. Muscle cramps   3. Foul smelling urine   4. Chest tightness   5. B12 deficiency   6. Dizziness     Plan:  ? Etiology of symptoms- ? Reaction to Topamax; she will hold for the upcoming weekend; EKG in office showed NSR; STAT CBC, CMP and U/A were normal; STAT CXR showed scarring; urine culture and d-dimer pending; encouraged to increase fluids- discussed water with electrolytes or Gatorade; strict ER precautions for upcoming weekend.  No follow-ups on file.  Orders Placed This Encounter  Procedures  . Urine Culture    Standing Status:   Future    Number of Occurrences:   1    Standing Expiration Date:   06/01/2019  . DG Chest 2 View    Standing Status:   Future    Number of Occurrences:   1    Standing Expiration Date:   07/31/2019    Order Specific Question:   Reason for Exam (SYMPTOM  OR DIAGNOSIS REQUIRED)    Answer:   chest tightness    Order Specific Question:   Is patient pregnant?    Answer:   No    Order Specific Question:   Preferred imaging location?    Answer:   [facility]    Order Specific Question:   Radiology Contrast Protocol - do NOT remove file path    Answer:   [email or web address]  . Magnesium    Standing Status:   Future    Number of Occurrences:   1    Standing Expiration Date:   06/01/2019  . D-Dimer, Quantitative    Standing Status:   Future    Number of Occurrences:   1    Standing Expiration Date:   06/01/2019  . CBC w/Diff    Standing Status:   Future    Number of Occurrences:   1    Standing Expiration Date:   06/01/2019  . Comp Met (CMET)    Standing Status:   Future    Number of Occurrences:   1    Standing Expiration Date:   06/01/2019  . Urinalysis    Standing Status:   Future    Number of Occurrences:   1    Standing Expiration Date:   06/01/2019  . EKG 12-Lead    Requested Prescriptions    No prescriptions requested or ordered in this  encounter

## 2018-06-01 NOTE — Patient Instructions (Signed)
Please call you cardiologist about the persisting symptoms; please hold the Topamax until we talk to you on Monday;

## 2018-06-01 NOTE — Progress Notes (Signed)
Reviewed with patient; similar scarring has been seen on previous CXR.

## 2018-06-02 LAB — URINE CULTURE
MICRO NUMBER:: 226677
SPECIMEN QUALITY:: ADEQUATE

## 2018-06-02 LAB — D-DIMER, QUANTITATIVE: D-Dimer, Quant: 0.3 mcg/mL FEU (ref <0.50)

## 2018-06-04 ENCOUNTER — Ambulatory Visit: Payer: BLUE CROSS/BLUE SHIELD | Admitting: Cardiovascular Disease

## 2018-06-04 ENCOUNTER — Other Ambulatory Visit: Payer: Self-pay | Admitting: Family

## 2018-06-04 ENCOUNTER — Encounter: Payer: Self-pay | Admitting: Family

## 2018-06-04 ENCOUNTER — Encounter: Payer: Self-pay | Admitting: Cardiovascular Disease

## 2018-06-04 VITALS — BP 130/68 | HR 70 | Ht 65.0 in | Wt 354.0 lb

## 2018-06-04 DIAGNOSIS — I10 Essential (primary) hypertension: Secondary | ICD-10-CM | POA: Diagnosis not present

## 2018-06-04 DIAGNOSIS — G4730 Sleep apnea, unspecified: Secondary | ICD-10-CM

## 2018-06-04 DIAGNOSIS — R079 Chest pain, unspecified: Secondary | ICD-10-CM

## 2018-06-04 DIAGNOSIS — I471 Supraventricular tachycardia: Secondary | ICD-10-CM | POA: Diagnosis not present

## 2018-06-04 MED ORDER — PENICILLIN V POTASSIUM 500 MG PO TABS: 500.0000 mg | ORAL_TABLET | Freq: Three times a day (TID) | ORAL | 0 refills | Status: DC

## 2018-06-04 NOTE — Patient Instructions (Signed)
Medication Instructions: Your physician recommends that you continue on your current medications as directed. Please refer to the Current Medication list given to you today.[name]: None today  Procedures/Testing: None today  Follow-Up: As planned for May  Any Additional Special Instructions Will Be Listed Below (If Applicable).     If you need a refill on your cardiac medications before your next appointment, please call your pharmacy.

## 2018-06-04 NOTE — Progress Notes (Signed)
SUBJECTIVE: The patient presents for premature follow-up.  I just saw her on 05/18/2018.  She complained of chest pain and shortness of breath and I arranged for coronary CT angiography.  She saw her PCP on 06/01/2018.  I reviewed notes.  She complained of blood pressure and pulse fluctuating with positional changes.  She also complained of nausea and lightheadedness.  Her BP was 142/86 with a heart rate of 68.  I reviewed the chest x-ray performed on 06/01/2018 which showed a mild linear opacity of the left lung base favoring focal scar.  She had several tests ordered by her PCP on 06/01/2018: Normal magnesium of 1.8, urine culture positive for Streptococcus agalactiae, normal d-dimer, mildly elevated white count of 10.9, and unremarkable comprehensive metabolic panel with mildly reduced GFR.  She told me her heart rate got as high as 106 bpm and then suddenly dropped to the 60 bpm range.  Diastolic blood pressures have been running in the 40-50 range.  She has been off of Topamax for 4 days.  She feels slightly better.  She is tearful when describing her symptoms.  She does admit to not drinking enough water.  She has not had coffee for a week.  Review of Systems: As per "subjective", otherwise negative.  No Known Allergies  Current Outpatient Medications  Medication Sig Dispense Refill  . ALPRAZolam (XANAX) 0.5 MG tablet Take 1 tablet (0.5 mg total) by mouth 3 times/day as needed-between meals & bedtime for anxiety. 30 tablet 0  . Cyanocobalamin (B-12 COMPLIANCE INJECTION) 1000 MCG/ML KIT Inject 1,000 mcg as directed every 30 (thirty) days.    [name] losartan (COZAAR) 50 MG tablet Take 1 tablet (50 mg total) by mouth daily. 30 tablet 11  . metFORMIN (GLUCOPHAGE) 500 MG tablet Take 1 tablet (500 mg total) by mouth 2 (two) times daily with a meal. 180 tablet 3  . metoprolol tartrate (LOPRESSOR) 25 MG tablet Take 12.5 mg by mouth 2 (two) times daily.    . pantoprazole (PROTONIX) 40 MG tablet  TAKE 1 TABLET BY MOUTH EVERY DAY 90 tablet 3  . PARoxetine (PAXIL) 20 MG tablet TAKE 1 TABLET BY MOUTH EVERY DAY 90 tablet 1  . penicillin v potassium (VEETID) 500 MG tablet Take 1 tablet (500 mg total) by mouth 3 (three) times daily. 15 tablet 0  . UNABLE TO FIND Med Name: CPAP    . valACYclovir (VALTREX) 1000 MG tablet TAKE 2 TABLETS EVERY 12 HOURS FOR 1 DAY START ASAP AFTER SYTPTOM ONSET 6 tablet 2  . Vitamin D, Ergocalciferol, (DRISDOL) 1.25 MG (50000 UT) CAPS capsule Take 1 capsule (50,000 Units total) by mouth every 7 (seven) days. 4 capsule 0   No current facility-administered medications for this visit.     Past Medical History:  Diagnosis Date  . Anxiety   . ASTHMA   . B12 deficiency   . DELAYED GASTRIC EMPTYING   . DEPRESSION   . GERD   . Glucose intolerance (impaired glucose tolerance) 04/09/2015  . Hypertension   . OBESITY   . OBSTRUCTIVE SLEEP APNEA   . POLYCYSTIC OVARIAN DISEASE   . Prediabetes   . SMOKER   . SOB (shortness of breath)   . SVT (supraventricular tachycardia) ([place])   . Tachycardia   . Umbilical hernia   . Umbilical hernia   . Vitamin B12 deficiency 03/2015 dx   start IM replacement q 30d  . Vitamin D deficiency 03/2015 dx    Past Surgical  History:  Procedure Laterality Date  . CESAREAN SECTION  97 & 98   x's 2  . Cocxyl removal  1995  . DILATION AND CURETTAGE OF UTERUS    . LAPAROSCOPY    . TUBAL LIGATION  1998    Social History   Socioeconomic History  . Marital status: Married    Spouse name: Thy Gullikson  . Number of children: 2  . Years of education: Not on file  . Highest education level: Not on file  Occupational History  . 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 Financial resource strain: Not on file  . Food insecurity:    Worry: Not on file    Inability: Not on file  . Transportation needs:    Medical: Not on file    Non-medical: Not on file  Tobacco Use  . Smoking status:  Former Smoker    Packs/day: 0.25    Years: 30.00    Pack years: 7.50    Types: Cigarettes    Start date: 10/23/1987    Last attempt to quit: 10/23/2017    Years since quitting: 0.6  . Smokeless tobacco: Never Used  Substance and Sexual Activity  . Alcohol use: Yes    Alcohol/week: 0.0 standard drinks    Comment: Rarely  . Drug use: No  . Sexual activity: Not on file  Lifestyle  . Physical activity:    Days per week: Not on file    Minutes per session: Not on file  . Stress: Not on file  Relationships  . Social connections:    Talks on phone: Not on file    Gets together: Not on file    Attends religious service: Not on file    Active member of club or organization: Not on file    Attends meetings of clubs or organizations: Not on file    Relationship status: Not on file  . Intimate partner violence:    Fear of current or ex partner: Not on file    Emotionally abused: Not on file    Physically abused: Not on file    Forced sexual activity: Not on file  Other Topics Concern  . Not on file  Social History Narrative   Lives with husband & 2 kids from 17st marriage. (1st husband expired 1998-leukemia) work as Merchandiser, retail, but currently unemployed     Vitals:   06/04/18 1510  BP: 130/68  Pulse: 70  SpO2: 98%  Weight: (!) 354 lb (160.6 kg)  Height: 5' 5"  (1.651 m)    Wt Readings from Last 3 Encounters:  06/04/18 (!) 354 lb (160.6 kg)  06/01/18 (!) 352 lb (159.7 kg)  05/18/18 (!) 349 lb (158.3 kg)     PHYSICAL EXAM General: NAD HEENT: Normal. Neck: No JVD, no thyromegaly. Lungs: Clear to auscultation bilaterally with normal respiratory effort. CV: Regular rate and rhythm, normal S1/S2, no S3/S4, no murmur. No pretibial or periankle edema.     Abdomen: Soft, nontender, obese.  Neurologic: Alert and oriented.  Psych: Normal affect. Skin: Normal. Musculoskeletal: No gross deformities.    ECG: Reviewed above under Subjective   Labs: Lab Results    Component Value Date/Time   K 4.0 06/01/2018 03:54 PM   BUN 19 06/01/2018 03:54 PM   BUN 12 02/14/2018 11:46 AM   CREATININE 1.12 06/01/2018 03:54 PM   ALT 13 06/01/2018 03:54 PM   TSH 2.480  02/14/2018 11:46 AM   HGB 12.2 06/01/2018 03:54 PM     Lipids: Lab Results  Component Value Date/Time   LDLCALC 133 (H) 11/24/2017 03:27 PM   LDLDIRECT 154.1 08/16/2012 08:28 AM   CHOL 201 (H) 11/24/2017 03:27 PM   TRIG 117.0 11/24/2017 03:27 PM   HDL 45.30 11/24/2017 03:27 PM       ASSESSMENT AND PLAN:  1.  Chest pain and shortness of breath: Normal nuclear stress test on 04/27/2016.  Symptoms have been ongoing since early 2019 and she said symptoms are very unlike her GERD or anxiety/panic attack symptoms.  Cardiac risk factors include type 2 diabetes.  I have already arranged for coronary CT angiography which is scheduled for March 9.  I advised her to take the recommended dose of metoprolol before hand.  2.  Paroxysmal supraventricular tachycardia: No recurrences.  Continue Lopressor 12.5 mg twice daily.  If she has frequent palpitations with an unremarkable coronary CT angiogram, I would consider event monitoring.  3.  Severe sleep apnea: She is on CPAP.  4.  Hypertension: Blood pressure is normal.  No changes to therapy.    Disposition: Follow up as previously scheduled  Kate Sable, M.D., F.A.C.C.

## 2018-06-07 NOTE — Progress Notes (Signed)
Subjective:    Patient ID: [name], female    DOB: 27-Aug-1970, 48 y.o.   MRN: [id number]  HPI She is here for a physical exam.   She went to the emergency room with chest pain and palpitations, which may have been related to the Topamax she was taking.  That has been discontinued..  She has follow-up with cardiology.  She did have a negative stress test from January 2018.  In the emergency room troponins were negative.  She will be having a cardiac CT scan and probable Holter monitor for further evaluation.  Anxiety: She does have extremely high anxiety.  She is taking her Paxil daily as prescribed.  She has Xanax that she can take as needed, but rarely takes it-she tries to avoid taking it.  She is following with the healthy weight management clinic and they would ideally like to take her off of the Paxil since this could have caused some of her weight gain.  They have discussed with her starting Effexor while being on Paxil and slowly tapering her off the Paxil.  She is anxious about this and would like to consider it, but would like to wait until after her cardiac tests.  She is having significant anxiety intermittently because of heart symptoms.  Medications and allergies reviewed with patient and updated if appropriate.  Patient Active Problem List   Diagnosis Date Noted  . Skin abnormalities 11/25/2017  . 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 Skin tag 11/25/2017  . Essential hypertension 11/01/2017  . Chronic frontal sinusitis 10/30/2017  . Submandibular gland mass 10/30/2017  . Prediabetes 05/18/2017  . SOB (shortness of breath) 09/24/2016  . Palpitations 09/24/2016  . Family history of early CAD 03/23/2016  . SVT (supraventricular tachycardia) ([place]) 03/14/2016  . Adrenal nodule ([place]) 01/06/2016  . Umbilical hernia 72/53/6644  . Morbid obesity ([place]) 04/09/2015  . Vitamin D deficiency   . Vitamin B12 deficiency   . Idiopathic intracranial hypertension 05/24/2013  .  Anxiety   . Cigarette smoker 01/28/2009  . Depression 01/28/2009  . GERD 01/28/2009  . POLYCYSTIC OVARIAN DISEASE 02/10/2007  . Class 3 severe obesity with serious comorbidity and body mass index (BMI) of 50.0 to 59.9 in adult ([place]) 02/10/2007  . Obstructive sleep apnea 02/10/2007  . Cough variant asthma 02/10/2007    Current Outpatient Medications on File Prior to Visit  Medication Sig Dispense Refill  . ALPRAZolam (XANAX) 0.5 MG tablet Take 1 tablet (0.5 mg total) by mouth 3 times/day as needed-between meals & bedtime for anxiety. 30 tablet 0  . Cyanocobalamin (B-12 COMPLIANCE INJECTION) 1000 MCG/ML KIT Inject 1,000 mcg as directed every 30 (thirty) days.    [name] losartan (COZAAR) 50 MG tablet Take 1 tablet (50 mg total) by mouth daily. 30 tablet 11  . metFORMIN (GLUCOPHAGE) 500 MG tablet Take 1 tablet (500 mg total) by mouth 2 (two) times daily with a meal. 180 tablet 3  . metoprolol tartrate (LOPRESSOR) 25 MG tablet Take 12.5 mg by mouth 2 (two) times daily.    . pantoprazole (PROTONIX) 40 MG tablet TAKE 1 TABLET BY MOUTH EVERY DAY 90 tablet 3  . PARoxetine (PAXIL) 20 MG tablet TAKE 1 TABLET BY MOUTH EVERY DAY 90 tablet 1  . UNABLE TO FIND Med Name: CPAP    . valACYclovir (VALTREX) 1000 MG tablet TAKE 2 TABLETS EVERY 12 HOURS FOR 1 DAY START ASAP AFTER SYTPTOM ONSET 6 tablet 2  . Vitamin D, Ergocalciferol, (DRISDOL) 1.25  MG (50000 UT) CAPS capsule Take 1 capsule (50,000 Units total) by mouth every 7 (seven) days. 4 capsule 0   No current facility-administered medications on file prior to visit.     Past Medical History:  Diagnosis Date  . Anxiety   . ASTHMA   . B12 deficiency   . DELAYED GASTRIC EMPTYING   . DEPRESSION   . GERD   . Glucose intolerance (impaired glucose tolerance) 04/09/2015  . Hypertension   . OBESITY   . OBSTRUCTIVE SLEEP APNEA   . POLYCYSTIC OVARIAN DISEASE   . Prediabetes   . SMOKER   . SOB (shortness of breath)   . SVT (supraventricular tachycardia)  ([place])   . Tachycardia   . Umbilical hernia   . Umbilical hernia   . Vitamin B12 deficiency 03/2015 dx   start IM replacement q 30d  . Vitamin D deficiency 03/2015 dx    Past Surgical History:  Procedure Laterality Date  . CESAREAN SECTION  97 & 98   x's 2  . Cocxyl removal  1995  . DILATION AND CURETTAGE OF UTERUS    . LAPAROSCOPY    . TUBAL LIGATION  1998    Social History   Socioeconomic History  . Marital status: Married    Spouse name: Amoria Mclees  . Number of children: 2  . Years of education: Not on file  . Highest education level: Not on file  Occupational History  . 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 Financial resource strain: Not on file  . Food insecurity:    Worry: Not on file    Inability: Not on file  . Transportation needs:    Medical: Not on file    Non-medical: Not on file  Tobacco Use  . Smoking status: Former Smoker    Packs/day: 0.25    Years: 30.00    Pack years: 7.50    Types: Cigarettes    Start date: 10/23/1987    Last attempt to quit: 10/23/2017    Years since quitting: 0.6  . Smokeless tobacco: Never Used  Substance and Sexual Activity  . Alcohol use: Yes    Alcohol/week: 0.0 standard drinks    Comment: Rarely  . Drug use: No  . Sexual activity: Not on file  Lifestyle  . Physical activity:    Days per week: Not on file    Minutes per session: Not on file  . Stress: Not on file  Relationships  . Social connections:    Talks on phone: Not on file    Gets together: Not on file    Attends religious service: Not on file    Active member of club or organization: Not on file    Attends meetings of clubs or organizations: Not on file    Relationship status: Not on file  Other Topics Concern  . Not on file  Social History Narrative   Lives with husband & 2 kids from 1st marriage. (1st husband expired 1998-leukemia) work as Merchandiser, retail, but currently unemployed     Family History  Problem Relation Age of Onset  . Breast cancer Mother        dx 11/2009  . Diabetes Mother   . High blood pressure Mother   . Thyroid disease Mother   . Anxiety disorder Mother   . Obesity Mother   . Diabetes Maternal Aunt   . Prostate cancer Maternal  Grandfather 70       also hx colon polyps  . Heart disease Paternal Grandmother   . Pancreatic cancer Paternal Grandmother 64  . High blood pressure Father   . High Cholesterol Father   . Depression Father   . Heart murmur Sister   . Adrenal disorder Neg Hx     Review of Systems  Constitutional: Positive for chills. Negative for fever.  Eyes: Negative for visual disturbance.  Respiratory: Positive for cough (mild) and shortness of breath (chronic). Negative for wheezing.   Cardiovascular: Positive for chest pain (intermittent) and palpitations. Negative for leg swelling.  Gastrointestinal: Positive for nausea. Negative for abdominal pain, blood in stool, constipation and diarrhea.  Genitourinary: Negative for dysuria and hematuria.  Musculoskeletal: Negative for arthralgias.  Skin: Negative for rash.  Neurological: Positive for light-headedness (at times) and headaches.  Psychiatric/Behavioral: Negative for dysphoric mood. The patient is nervous/anxious.        Objective:   Vitals:   06/08/18 1521  BP: 108/62  Pulse: 74  Resp: 18  Temp: 98 F (36.7 C)  SpO2: 98%   Filed Weights   06/08/18 1521  Weight: (!) 357 lb (161.9 kg)   Body mass index is 59.41 kg/m.  BP Readings from Last 3 Encounters:  06/08/18 108/62  06/04/18 130/68  06/01/18 120/76    Wt Readings from Last 3 Encounters:  06/08/18 (!) 357 lb (161.9 kg)  06/04/18 (!) 354 lb (160.6 kg)  06/01/18 (!) 352 lb (159.7 kg)     Physical Exam Constitutional: She appears well-developed and well-nourished. No distress.  HENT:  Head: Normocephalic and atraumatic.  Right Ear: External ear normal. Normal ear canal and TM Left Ear:  External ear normal.  Normal ear canal and TM Mouth/Throat: Oropharynx is clear and moist.  Eyes: Conjunctivae and EOM are normal.  Neck: Neck supple. No tracheal deviation present. No thyromegaly present. No carotid bruit  Cardiovascular: Normal rate, regular rhythm and normal heart sounds.   No murmur heard.  No edema. Pulmonary/Chest: Effort normal and breath sounds normal. No respiratory distress. She has no wheezes. She has no rales.  Breast: deferred to Gyn Abdominal: Soft. She exhibits no distension. There is no tenderness.  Lymphadenopathy: She has no cervical adenopathy.  Skin: Skin is warm and dry. She is not diaphoretic.  Psychiatric: She has a normal mood and affect. Her behavior is normal.        Assessment & Plan:   Physical exam: Screening blood work  reviewed Immunizations   Up to date  Mammogram   Up to date  Gyn   Up to date  Eye exams   Up to date  Exercise  none Weight   Working on weight loss - goes to the healthy weight management clinic Skin  Sees derm Substance abuse  Not smoking  See Problem List for Assessment and Plan of chronic medical problems.       

## 2018-06-07 NOTE — Patient Instructions (Addendum)
All other Health Maintenance issues reviewed.   All recommended immunizations and age-appropriate screenings are up-to-date or discussed.  No immunizations administered today.   Medications reviewed and updated.  Changes include :   Take diflucan if needed. Take the xanax if needed.   Your prescription(s) have been submitted to your pharmacy. Please take as directed and contact our office if you believe you are having problem(s) with the medication(s).     Health Maintenance, Female Adopting a healthy lifestyle and getting preventive care can go a long way to promote health and wellness. Talk with your health care provider about what schedule of regular examinations is right for you. This is a good chance for you to check in with your provider about disease prevention and staying healthy. In between checkups, there are plenty of things you can do on your own. Experts have done a lot of research about which lifestyle changes and preventive measures are most likely to keep you healthy. Ask your health care provider for more information. Weight and diet Eat a healthy diet  Be sure to include plenty of vegetables, fruits, low-fat dairy products, and lean protein.  Do not eat a lot of foods high in solid fats, added sugars, or salt.  Get regular exercise. This is one of the most important things you can do for your health. ? Most adults should exercise for at least 150 minutes each week. The exercise should increase your heart rate and make you sweat (moderate-intensity exercise). ? Most adults should also do strengthening exercises at least twice a week. This is in addition to the moderate-intensity exercise. Maintain a healthy weight  Body mass index (BMI) is a measurement that can be used to identify possible weight problems. It estimates body fat based on height and weight. Your health care provider can help determine your BMI and help you achieve or maintain a healthy weight.  For  females 47 years of age and older: ? A BMI below 18.5 is considered underweight. ? A BMI of 18.5 to 24.9 is normal. ? A BMI of 25 to 29.9 is considered overweight. ? A BMI of 30 and above is considered obese. Watch levels of cholesterol and blood lipids  You should start having your blood tested for lipids and cholesterol at 47 years of age, then have this test every 5 years.  You may need to have your cholesterol levels checked more often if: ? Your lipid or cholesterol levels are high. ? You are older than 47 years of age. ? You are at high risk for heart disease. Cancer screening Lung Cancer  Lung cancer screening is recommended for adults 14-47 years old who are at high risk for lung cancer because of a history of smoking.  A yearly low-dose CT scan of the lungs is recommended for people who: ? Currently smoke. ? Have quit within the past 15 years. ? Have at least a 30-pack-year history of smoking. A pack year is smoking an average of one pack of cigarettes a day for 1 year.  Yearly screening should continue until it has been 15 years since you quit.  Yearly screening should stop if you develop a health problem that would prevent you from having lung cancer treatment. Breast Cancer  Practice breast self-awareness. This means understanding how your breasts normally appear and feel.  It also means doing regular breast self-exams. Let your health care provider know about any changes, no matter how small.  If you are in your 47s  or 30s, you should have a clinical breast exam (CBE) by a health care provider every 1-3 years as part of a regular health exam.  If you are 47 or older, have a CBE every year. Also consider having a breast X-ray (mammogram) every year.  If you have a family history of breast cancer, talk to your health care provider about genetic screening.  If you are at high risk for breast cancer, talk to your health care provider about having an MRI and a mammogram  every year.  Breast cancer gene (BRCA) assessment is recommended for women who have family members with BRCA-related cancers. BRCA-related cancers include: ? Breast. ? Ovarian. ? Tubal. ? Peritoneal cancers.  Results of the assessment will determine the need for genetic counseling and BRCA1 and BRCA2 testing. Cervical Cancer Your health care provider may recommend that you be screened regularly for cancer of the pelvic organs (ovaries, uterus, and vagina). This screening involves a pelvic examination, including checking for microscopic changes to the surface of your cervix (Pap test). You may be encouraged to have this screening done every 3 years, beginning at age 47.  For women ages 3-47, health care providers may recommend pelvic exams and Pap testing every 3 years, or they may recommend the Pap and pelvic exam, combined with testing for human papilloma virus (HPV), every 5 years. Some types of HPV increase your risk of cervical cancer. Testing for HPV may also be done on women of any age with unclear Pap test results.  Other health care providers may not recommend any screening for nonpregnant women who are considered low risk for pelvic cancer and who do not have symptoms. Ask your health care provider if a screening pelvic exam is right for you.  If you have had past treatment for cervical cancer or a condition that could lead to cancer, you need Pap tests and screening for cancer for at least 20 years after your treatment. If Pap tests have been discontinued, your risk factors (such as having a new sexual partner) need to be reassessed to determine if screening should resume. Some women have medical problems that increase the chance of getting cervical cancer. In these cases, your health care provider may recommend more frequent screening and Pap tests. Colorectal Cancer  This type of cancer can be detected and often prevented.  Routine colorectal cancer screening usually begins at 47  years of age and continues through 47 years of age.  Your health care provider may recommend screening at an earlier age if you have risk factors for colon cancer.  Your health care provider may also recommend using home test kits to check for hidden blood in the stool.  A small camera at the end of a tube can be used to examine your colon directly (sigmoidoscopy or colonoscopy). This is done to check for the earliest forms of colorectal cancer.  Routine screening usually begins at age 4.  Direct examination of the colon should be repeated every 5-10 years through 47 years of age. However, you may need to be screened more often if early forms of precancerous polyps or small growths are found. Skin Cancer  Check your skin from head to toe regularly.  Tell your health care provider about any new moles or changes in moles, especially if there is a change in a mole's shape or color.  Also tell your health care provider if you have a mole that is larger than the size of a pencil eraser.  Always use sunscreen. Apply sunscreen liberally and repeatedly throughout the day.  Protect yourself by wearing long sleeves, pants, a wide-brimmed hat, and sunglasses whenever you are outside. Heart disease, diabetes, and high blood pressure  High blood pressure causes heart disease and increases the risk of stroke. High blood pressure is more likely to develop in: ? People who have blood pressure in the high end of the normal range (130-139/85-89 mm Hg). ? People who are overweight or obese. ? People who are African American.  If you are 48-28 years of age, have your blood pressure checked every 3-5 years. If you are 50 years of age or older, have your blood pressure checked every year. You should have your blood pressure measured twice-once when you are at a hospital or clinic, and once when you are not at a hospital or clinic. Record the average of the two measurements. To check your blood pressure when  you are not at a hospital or clinic, you can use: ? An automated blood pressure machine at a pharmacy. ? A home blood pressure monitor.  If you are between 74 years and 5 years old, ask your health care provider if you should take aspirin to prevent strokes.  Have regular diabetes screenings. This involves taking a blood sample to check your fasting blood sugar level. ? If you are at a normal weight and have a low risk for diabetes, have this test once every three years after 47 years of age. ? If you are overweight and have a high risk for diabetes, consider being tested at a younger age or more often. Preventing infection Hepatitis B  If you have a higher risk for hepatitis B, you should be screened for this virus. You are considered at high risk for hepatitis B if: ? You were born in a country where hepatitis B is common. Ask your health care provider which countries are considered high risk. ? Your parents were born in a high-risk country, and you have not been immunized against hepatitis B (hepatitis B vaccine). ? You have HIV or AIDS. ? You use needles to inject street drugs. ? You live with someone who has hepatitis B. ? You have had sex with someone who has hepatitis B. ? You get hemodialysis treatment. ? You take certain medicines for conditions, including cancer, organ transplantation, and autoimmune conditions. Hepatitis C  Blood testing is recommended for: ? Everyone born from [birth year] through 1964. ? Anyone with known risk factors for hepatitis C. Sexually transmitted infections (STIs)  You should be screened for sexually transmitted infections (STIs) including gonorrhea and chlamydia if: ? You are sexually active and are younger than 47 years of age. ? You are older than 47 years of age and your health care provider tells you that you are at risk for this type of infection. ? Your sexual activity has changed since you were last screened and you are at an increased risk for  chlamydia or gonorrhea. Ask your health care provider if you are at risk.  If you do not have HIV, but are at risk, it may be recommended that you take a prescription medicine daily to prevent HIV infection. This is called pre-exposure prophylaxis (PrEP). You are considered at risk if: ? You are sexually active and do not regularly use condoms or know the HIV status of your partner(s). ? You take drugs by injection. ? You are sexually active with a partner who has HIV. Talk with your health care provider  about whether you are at high risk of being infected with HIV. If you choose to begin PrEP, you should first be tested for HIV. You should then be tested every 3 months for as long as you are taking PrEP. Pregnancy  If you are premenopausal and you may become pregnant, ask your health care provider about preconception counseling.  If you may become pregnant, take 400 to 800 micrograms (mcg) of folic acid every day.  If you want to prevent pregnancy, talk to your health care provider about birth control (contraception). Osteoporosis and menopause  Osteoporosis is a disease in which the bones lose minerals and strength with aging. This can result in serious bone fractures. Your risk for osteoporosis can be identified using a bone density scan.  If you are 6 years of age or older, or if you are at risk for osteoporosis and fractures, ask your health care provider if you should be screened.  Ask your health care provider whether you should take a calcium or vitamin D supplement to lower your risk for osteoporosis.  Menopause may have certain physical symptoms and risks.  Hormone replacement therapy may reduce some of these symptoms and risks. Talk to your health care provider about whether hormone replacement therapy is right for you. Follow these instructions at home:  Schedule regular health, dental, and eye exams.  Stay current with your immunizations.  Do not use any tobacco products  including cigarettes, chewing tobacco, or electronic cigarettes.  If you are pregnant, do not drink alcohol.  If you are breastfeeding, limit how much and how often you drink alcohol.  Limit alcohol intake to no more than 1 drink per day for nonpregnant women. One drink equals 12 ounces of beer, 5 ounces of wine, or 1 ounces of hard liquor.  Do not use street drugs.  Do not share needles.  Ask your health care provider for help if you need support or information about quitting drugs.  Tell your health care provider if you often feel depressed.  Tell your health care provider if you have ever been abused or do not feel safe at home. This information is not intended to replace advice given to you by your health care provider. Make sure you discuss any questions you have with your health care provider. Document Released: 10/11/2010 Document Revised: 09/03/2015 Document Reviewed: 12/30/2014 Elsevier Interactive Patient Education  2019 [employer].

## 2018-06-08 ENCOUNTER — Encounter: Payer: Self-pay | Admitting: Internal Medicine

## 2018-06-08 ENCOUNTER — Ambulatory Visit (INDEPENDENT_AMBULATORY_CARE_PROVIDER_SITE_OTHER): Payer: BLUE CROSS/BLUE SHIELD | Admitting: Internal Medicine

## 2018-06-08 VITALS — BP 108/62 | HR 74 | Temp 98.0°F | Resp 18 | Ht 65.0 in | Wt 357.0 lb

## 2018-06-08 DIAGNOSIS — I10 Essential (primary) hypertension: Secondary | ICD-10-CM | POA: Diagnosis not present

## 2018-06-08 DIAGNOSIS — F419 Anxiety disorder, unspecified: Secondary | ICD-10-CM

## 2018-06-08 DIAGNOSIS — R7303 Prediabetes: Secondary | ICD-10-CM | POA: Diagnosis not present

## 2018-06-08 DIAGNOSIS — Z0000 Encounter for general adult medical examination without abnormal findings: Secondary | ICD-10-CM | POA: Diagnosis not present

## 2018-06-08 DIAGNOSIS — K219 Gastro-esophageal reflux disease without esophagitis: Secondary | ICD-10-CM | POA: Diagnosis not present

## 2018-06-08 MED ORDER — FLUCONAZOLE 150 MG PO TABS: ORAL_TABLET | ORAL | 0 refills | Status: DC

## 2018-06-08 MED ORDER — METFORMIN HCL 500 MG PO TABS: 500.0000 mg | ORAL_TABLET | Freq: Two times a day (BID) | ORAL | 3 refills | Status: DC

## 2018-06-08 MED ORDER — ALPRAZOLAM 0.5 MG PO TABS: 0.5000 mg | ORAL_TABLET | Freq: Two times a day (BID) | ORAL | 0 refills | Status: DC | PRN

## 2018-06-09 NOTE — Assessment & Plan Note (Signed)
She will follow-up with me after her heart tests are done Agree with starting her on Effexor and very slowly tapering her off the paroxetine Discussed at length that she needs to use the alprazolam if needed Discussed that it is addicting, but I believe she is very low risk for that

## 2018-06-09 NOTE — Assessment & Plan Note (Signed)
GERD controlled Continue daily medication

## 2018-06-09 NOTE — Assessment & Plan Note (Addendum)
Going to the [facility] weight management clinic-has had weight loss Topamax had to be discontinued because it caused bradycardia Hopefully will be able to come off of proximal teen, which may have contributed to her weight gain She has made significant changes in her diet Encouraged her to continue her efforts

## 2018-06-09 NOTE — Assessment & Plan Note (Signed)
A1c done recently by weight management clinic Compliant with a low sugar/carbohydrate diet Working on weight loss

## 2018-06-09 NOTE — Assessment & Plan Note (Signed)
BP well controlled Current regimen effective and well tolerated Continue current medications at current doses  

## 2018-06-12 ENCOUNTER — Telehealth: Payer: Self-pay | Admitting: *Deleted

## 2018-06-12 ENCOUNTER — Other Ambulatory Visit: Payer: Self-pay

## 2018-06-12 ENCOUNTER — Other Ambulatory Visit (HOSPITAL_COMMUNITY)
Admission: RE | Admit: 2018-06-12 | Discharge: 2018-06-12 | Disposition: A | Discharge status: Home / Self Care | Payer: BLUE CROSS/BLUE SHIELD | Source: Ambulatory Visit | Attending: Cardiovascular Disease | Admitting: Cardiovascular Disease

## 2018-06-12 DIAGNOSIS — R079 Chest pain, unspecified: Secondary | ICD-10-CM | POA: Insufficient documentation

## 2018-06-12 LAB — BASIC METABOLIC PANEL
Anion gap: 7 (ref 5–15)
BUN: 14 mg/dL (ref 6–20)
CO2: 25 mmol/L (ref 22–32)
Calcium: 8.5 mg/dL — ABNORMAL LOW (ref 8.9–10.3)
Chloride: 107 mmol/L (ref 98–111)
Creatinine, Ser: 0.84 mg/dL (ref 0.44–1.00)
GFR calc Af Amer: >60 mL/min (ref >60)
GFR calc non Af Amer: >60 mL/min (ref >60)
Glucose, Bld: 113 mg/dL — ABNORMAL HIGH (ref 70–99)
Potassium: 3.8 mmol/L (ref 3.5–5.1)
Sodium: 139 mmol/L (ref 135–145)

## 2018-06-12 NOTE — Telephone Encounter (Signed)
-----   Message from Herminio Commons, MD sent at 06/12/2018 10:22 AM EST ----- Normal renal function.

## 2018-06-12 NOTE — Telephone Encounter (Signed)
Called patient with test results. No answer. Left message to call back.  

## 2018-06-13 ENCOUNTER — Encounter (INDEPENDENT_AMBULATORY_CARE_PROVIDER_SITE_OTHER): Payer: Self-pay

## 2018-06-13 ENCOUNTER — Ambulatory Visit (INDEPENDENT_AMBULATORY_CARE_PROVIDER_SITE_OTHER): Payer: Self-pay | Admitting: Family Medicine

## 2018-06-14 ENCOUNTER — Encounter (INDEPENDENT_AMBULATORY_CARE_PROVIDER_SITE_OTHER): Payer: Self-pay | Admitting: Family Medicine

## 2018-06-15 ENCOUNTER — Telehealth (HOSPITAL_COMMUNITY): Payer: Self-pay | Admitting: Emergency Medicine

## 2018-06-15 NOTE — Telephone Encounter (Signed)
Reaching out to patient to offer assistance regarding upcoming cardiac imaging study; pt verbalizes understanding of appt date/time, parking situation and where to check in, pre-test NPO status and medications ordered, and verified current allergies; name and call back number provided for further questions should they arise Lorraine Bond RN [facility] [phone] office [phone] cell  Pt states her fitbit has been notifying her of lower heart rates and she is nervous to take full 168m metoprolol. HR in last office visit was 74bpm.   Pts husband is driving her to appt, so she will take her antianxiety medication at 11am.

## 2018-06-18 ENCOUNTER — Ambulatory Visit (HOSPITAL_COMMUNITY)
Admission: RE | Admit: 2018-06-18 | Discharge: 2018-06-18 | Disposition: A | Discharge status: Home / Self Care | Payer: BLUE CROSS/BLUE SHIELD | Source: Ambulatory Visit | Attending: Cardiovascular Disease | Admitting: Cardiovascular Disease

## 2018-06-18 ENCOUNTER — Encounter (HOSPITAL_COMMUNITY): Payer: Self-pay

## 2018-06-18 DIAGNOSIS — R079 Chest pain, unspecified: Secondary | ICD-10-CM

## 2018-06-18 MED ORDER — IOHEXOL 350 MG/ML SOLN
80.0000 mL | Freq: Once | INTRAVENOUS | Status: AC | PRN
  Administered 2018-06-18: 100 mL via INTRAVENOUS

## 2018-06-18 MED ORDER — NITROGLYCERIN 0.4 MG SL SUBL
0.8000 mg | SUBLINGUAL_TABLET | Freq: Once | SUBLINGUAL | Status: AC
  Administered 2018-06-18: 0.8 mg via SUBLINGUAL
  Filled 2018-06-18: qty 25

## 2018-06-18 MED ORDER — METOPROLOL TARTRATE 5 MG/5ML IV SOLN
5.0000 mg | INTRAVENOUS | Status: DC | PRN
  Administered 2018-06-18: 5 mg via INTRAVENOUS
  Filled 2018-06-18: qty 5

## 2018-06-18 MED ORDER — NITROGLYCERIN 0.4 MG SL SUBL
SUBLINGUAL_TABLET | SUBLINGUAL | Status: AC
  Administered 2018-06-18: 0.8 mg via SUBLINGUAL
  Filled 2018-06-18: qty 2

## 2018-06-18 MED ORDER — METOPROLOL TARTRATE 5 MG/5ML IV SOLN
INTRAVENOUS | Status: AC
  Administered 2018-06-18: 5 mg via INTRAVENOUS
  Filled 2018-06-18: qty 5

## 2018-06-18 NOTE — Progress Notes (Signed)
Pt tolerated exam without incident.  C/O slight frontal headache.  Pt provided with caffeine and crackers post-CT scan.  PIV removed and dressing applied Discharge instructions discussed and instructions printed out for pt to take home. Pt discharged

## 2018-06-27 ENCOUNTER — Telehealth: Payer: Self-pay

## 2018-07-02 ENCOUNTER — Other Ambulatory Visit: Payer: Self-pay

## 2018-07-02 DIAGNOSIS — R002 Palpitations: Secondary | ICD-10-CM

## 2018-07-04 NOTE — Telephone Encounter (Signed)
Patient was scheduled for 3/4 at 11:15 and cancelled.

## 2018-07-06 ENCOUNTER — Ambulatory Visit: Payer: BLUE CROSS/BLUE SHIELD | Admitting: Pulmonary Disease

## 2018-07-06 ENCOUNTER — Ambulatory Visit: Payer: BLUE CROSS/BLUE SHIELD

## 2018-07-08 ENCOUNTER — Ambulatory Visit (INDEPENDENT_AMBULATORY_CARE_PROVIDER_SITE_OTHER): Payer: BLUE CROSS/BLUE SHIELD

## 2018-07-08 DIAGNOSIS — R002 Palpitations: Secondary | ICD-10-CM | POA: Diagnosis not present

## 2018-07-10 ENCOUNTER — Other Ambulatory Visit: Payer: Self-pay

## 2018-07-11 ENCOUNTER — Telehealth: Payer: Self-pay | Admitting: Cardiology

## 2018-07-11 NOTE — Telephone Encounter (Signed)
Received call from [employer]. Patient wearing monitor 2/2 to palpitations. Received auto-transmission this morning around 4am with 10 beat run of NSVT with underlying rhythm being SR. Self terminated. This was auto detected and patient without reported symptoms, currently on BB therapy. Will continue with current treatment regimen at this time.   Reino Bellis

## 2018-07-25 ENCOUNTER — Encounter: Payer: Self-pay | Admitting: Internal Medicine

## 2018-07-25 NOTE — Telephone Encounter (Signed)
She has not absorbed oral B12 in the past.  I think she does need the injections monthly and she is symptomatic so I think we should give her a B12 injection.

## 2018-07-26 ENCOUNTER — Telehealth: Payer: Self-pay

## 2018-07-26 NOTE — Telephone Encounter (Signed)
Copied from [place] [id number]. Topic: General - Inquiry >> Jul 25, 2018  2:50 PM Rainey Pines A wrote: Reason for CRM: Patient is requesting an appt for her B12 shot and stated that she is in need of this. I called practice and was advised to let patient know that the B12 shot would have to be approved in order to be administered meeting at patients car. Pt stated that she would like someone to call her back and that 07/27/2018 at anytime would work best for her.  Per dr burns, ok to give b12 injection, patient has agreed to do injection in parking lot 4/17 nurse visit

## 2018-07-27 ENCOUNTER — Ambulatory Visit (INDEPENDENT_AMBULATORY_CARE_PROVIDER_SITE_OTHER): Payer: BLUE CROSS/BLUE SHIELD

## 2018-07-27 ENCOUNTER — Other Ambulatory Visit (INDEPENDENT_AMBULATORY_CARE_PROVIDER_SITE_OTHER): Payer: Self-pay | Admitting: Family Medicine

## 2018-07-27 DIAGNOSIS — E538 Deficiency of other specified B group vitamins: Secondary | ICD-10-CM

## 2018-07-27 DIAGNOSIS — E559 Vitamin D deficiency, unspecified: Secondary | ICD-10-CM

## 2018-07-27 MED ORDER — CYANOCOBALAMIN 1000 MCG/ML IJ SOLN
1000.0000 ug | Freq: Once | INTRAMUSCULAR | Status: AC
  Administered 2018-07-27: 1000 ug via INTRAMUSCULAR

## 2018-07-27 NOTE — Progress Notes (Signed)
b12 Injection given.   Binnie Rail, MD

## 2018-07-31 ENCOUNTER — Encounter: Payer: Self-pay | Admitting: Internal Medicine

## 2018-07-31 ENCOUNTER — Other Ambulatory Visit (INDEPENDENT_AMBULATORY_CARE_PROVIDER_SITE_OTHER): Payer: Self-pay | Admitting: Family Medicine

## 2018-07-31 ENCOUNTER — Other Ambulatory Visit: Payer: Self-pay | Admitting: Internal Medicine

## 2018-07-31 DIAGNOSIS — B001 Herpesviral vesicular dermatitis: Secondary | ICD-10-CM

## 2018-07-31 DIAGNOSIS — E559 Vitamin D deficiency, unspecified: Secondary | ICD-10-CM

## 2018-08-01 ENCOUNTER — Encounter (INDEPENDENT_AMBULATORY_CARE_PROVIDER_SITE_OTHER): Payer: Self-pay

## 2018-08-03 ENCOUNTER — Ambulatory Visit: Payer: BLUE CROSS/BLUE SHIELD | Admitting: Pulmonary Disease

## 2018-08-09 ENCOUNTER — Ambulatory Visit (INDEPENDENT_AMBULATORY_CARE_PROVIDER_SITE_OTHER): Payer: BLUE CROSS/BLUE SHIELD | Admitting: Family Medicine

## 2018-08-09 ENCOUNTER — Encounter (INDEPENDENT_AMBULATORY_CARE_PROVIDER_SITE_OTHER): Payer: Self-pay | Admitting: Family Medicine

## 2018-08-09 ENCOUNTER — Other Ambulatory Visit: Payer: Self-pay

## 2018-08-09 DIAGNOSIS — Z6841 Body Mass Index (BMI) 40.0 and over, adult: Secondary | ICD-10-CM

## 2018-08-09 DIAGNOSIS — R7303 Prediabetes: Secondary | ICD-10-CM

## 2018-08-09 DIAGNOSIS — E66813 Obesity, class 3: Secondary | ICD-10-CM

## 2018-08-09 DIAGNOSIS — E559 Vitamin D deficiency, unspecified: Secondary | ICD-10-CM | POA: Diagnosis not present

## 2018-08-09 MED ORDER — VITAMIN D (ERGOCALCIFEROL) 1.25 MG (50000 UNIT) PO CAPS: 50000.0000 [IU] | ORAL_CAPSULE | ORAL | 0 refills | Status: DC

## 2018-08-13 ENCOUNTER — Encounter (INDEPENDENT_AMBULATORY_CARE_PROVIDER_SITE_OTHER): Payer: Self-pay | Admitting: Family Medicine

## 2018-08-13 NOTE — Progress Notes (Signed)
Office: [phone]  /  Fax: [phone] TeleHealth Visit:  [name] has verbally consented to this TeleHealth visit today. The patient is located at work, the provider is located at the [employer] [facility] office. The participants in this visit include the listed provider and patient. The visit was conducted today via FaceTime.  HPI:   Chief Complaint: OBESITY [name] is here to discuss her progress with her obesity treatment plan. She is on the Category 3 plan and is following her eating plan approximately 0% of the time. She states she is exercising 0 minutes 0 times per week. [name] states she weighed 360 lbs today and has gained weight. She reports drinking 3-4 sodas per day. She would like to ease back into plan slowly since she has been off the plan for a while. She was quite nervous about having a visit today because she felt ashamed she had gained weight.  We were unable to weigh the patient today for this TeleHealth visit. She feels as if she has gained weight since her last visit. She has lost 24 lbs since starting treatment with [place].  Vitamin D deficiency [name] has a diagnosis of Vitamin D deficiency, which is not at goal. Her last Vitamin D level was reported to be 46.07 on 11/24/2017. She is currently taking prescription Vit D and denies nausea, vomiting or muscle weakness.  Pre-Diabetes [name] has a diagnosis of prediabetes based on her elevated Hgb A1c and was informed this puts her at greater risk of developing diabetes. She is taking metformin currently BID per her PCP and continues to work on diet and exercise to decrease risk of diabetes. She reports polyphagia. Lab Results  Component Value Date   HGBA1C 5.7 (H) 02/14/2018    ASSESSMENT AND PLAN:  Vitamin D deficiency - Plan: Vitamin D, Ergocalciferol, (DRISDOL) 1.25 MG (50000 UT) CAPS capsule  Prediabetes  Class 3 severe obesity with serious comorbidity and body mass index (BMI) of 50.0 to 59.9 in adult,  unspecified obesity type ([place])  PLAN:  Vitamin D Deficiency [name] was informed that low Vitamin D levels contributes to fatigue and are associated with obesity, breast, and colon cancer. She agrees to continue to take prescription Vit D @ 50,000 IU every week #4 with 0 refills and will follow-up for routine testing of Vitamin D, at least 2-3 times per year. She was informed of the risk of over-replacement of Vitamin D and agrees to not increase her dose unless she discusses this with [place] first. [name] agrees to follow-up with our clinic in 3 weeks.  Pre-Diabetes [name] will continue to work on weight loss, exercise, and decreasing simple carbohydrates in her diet to help decrease the risk of diabetes. We dicussed metformin including benefits and risks. She was informed that eating too many simple carbohydrates or too many calories at one sitting increases the likelihood of GI side effects. [name] will continue her meal plan and metformin. She agrees to follow-up with [place] as directed to monitor her progress.  Obesity [name] is currently in the action stage of change. As such, her goal is to continue with weight loss efforts. She has agreed to follow the Category 3 plan. but would like to "ease" into the plan. She was advised to: 1. Eat Category 3 at lunch 2. Decrease her soda intake to 1 per day. 3. Have a [name] Delight breakfast 4. Have a normal dinner (1 serving).  [name] has not been prescribed exercise at this time. We  discussed the following Behavioral Modification Strategies today: increasing lean protein intake, decreasing simple carbohydrates, increase H20 intake, decrease liquid calories, and planning for success.  [name] has agreed to follow-up with our clinic in 3 weeks. She was informed of the importance of frequent follow-up visits to maximize her success with intensive lifestyle modifications for her multiple health conditions.  ALLERGIES: No Known Allergies  MEDICATIONS: Current  Outpatient Medications on File Prior to Visit  Medication Sig Dispense Refill  . ALPRAZolam (XANAX) 0.5 MG tablet Take 1 tablet (0.5 mg total) by mouth 3 times/day as needed-between meals & bedtime for anxiety. 40 tablet 0  . Cyanocobalamin (B-12 COMPLIANCE INJECTION) 1000 MCG/ML KIT Inject 1,000 mcg as directed every 30 (thirty) days.    . fluconazole (DIFLUCAN) 150 MG tablet Take one dose, repeat after 3 days if needed 2 tablet 0  . losartan (COZAAR) 50 MG tablet Take 1 tablet (50 mg total) by mouth daily. 30 tablet 11  . metFORMIN (GLUCOPHAGE) 500 MG tablet Take 1 tablet (500 mg total) by mouth 2 (two) times daily with a meal. 180 tablet 3  . metoprolol tartrate (LOPRESSOR) 25 MG tablet Take 12.5 mg by mouth 2 (two) times daily.    . pantoprazole (PROTONIX) 40 MG tablet TAKE 1 TABLET BY MOUTH EVERY DAY 90 tablet 3  . PARoxetine (PAXIL) 20 MG tablet TAKE 1 TABLET BY MOUTH EVERY DAY 90 tablet 1  . UNABLE TO FIND Med Name: CPAP    . valACYclovir (VALTREX) 1000 MG tablet TAKE 2 TABLETS EVERY 12 HOURS FOR 1 DAY START ASAP AFTER SYTPTOM ONSET 12 tablet 11   No current facility-administered medications on file prior to visit.     PAST MEDICAL HISTORY: Past Medical History:  Diagnosis Date  . Anxiety   . ASTHMA   . B12 deficiency   . DELAYED GASTRIC EMPTYING   . DEPRESSION   . GERD   . Glucose intolerance (impaired glucose tolerance) 04/09/2015  . Hypertension   . OBESITY   . OBSTRUCTIVE SLEEP APNEA   . POLYCYSTIC OVARIAN DISEASE   . Prediabetes   . SMOKER   . SOB (shortness of breath)   . SVT (supraventricular tachycardia) ([name])   . Tachycardia   . Umbilical hernia   . Umbilical hernia   . Vitamin B12 deficiency 03/2015 dx   start IM replacement q 30d  . Vitamin D deficiency 03/2015 dx    PAST SURGICAL HISTORY: Past Surgical History:  Procedure Laterality Date  . CESAREAN SECTION  97 & 98   x's 2  . Cocxyl removal  1995  . DILATION AND CURETTAGE OF UTERUS    . LAPAROSCOPY     . TUBAL LIGATION  1998    SOCIAL HISTORY: Social History   Tobacco Use  . Smoking status: Former Smoker    Packs/day: 0.25    Years: 30.00    Pack years: 7.50    Types: Cigarettes    Start date: 10/23/1987    Last attempt to quit: 10/23/2017    Years since quitting: 0.8  . Smokeless tobacco: Never Used  Substance Use Topics  . Alcohol use: Yes    Alcohol/week: 0.0 standard drinks    Comment: Rarely  . Drug use: No    FAMILY HISTORY: Family History  Problem Relation Age of Onset  . Breast cancer Mother        dx 11/2009  . Diabetes Mother   . High blood pressure Mother   . Thyroid disease Mother   .  Anxiety disorder Mother   . Obesity Mother   . Diabetes Maternal Aunt   . Prostate cancer Maternal Grandfather 37       also hx colon polyps  . Heart disease Paternal Grandmother   . Pancreatic cancer Paternal Grandmother 15  . High blood pressure Father   . High Cholesterol Father   . Depression Father   . Heart murmur Sister   . Adrenal disorder Neg Hx    ROS: Review of Systems  Gastrointestinal: Negative for nausea and vomiting.  Musculoskeletal:       Negative for muscle weakness.  Endo/Heme/Allergies:       Positive for polyphagia.   PHYSICAL EXAM: Pt in no acute distress  RECENT LABS AND TESTS: BMET    Component Value Date/Time   NA 139 06/12/2018 0942   NA 139 02/14/2018 1146   K 3.8 06/12/2018 0942   CL 107 06/12/2018 0942   CO2 25 06/12/2018 0942   GLUCOSE 113 (H) 06/12/2018 0942   BUN 14 06/12/2018 0942   BUN 12 02/14/2018 1146   CREATININE 0.84 06/12/2018 0942   CALCIUM 8.5 (L) 06/12/2018 0942   GFRNONAA >60 06/12/2018 0942   GFRAA >60 06/12/2018 0942   Lab Results  Component Value Date   HGBA1C 5.7 (H) 02/14/2018   HGBA1C 6.1 11/24/2017   HGBA1C 5.9 05/19/2017   HGBA1C 5.9 05/25/2016   HGBA1C 5.9 10/14/2015   Lab Results  Component Value Date   INSULIN 19.1 02/14/2018   CBC    Component Value Date/Time   WBC 10.9 (H)  06/01/2018 1554   RBC 4.08 06/01/2018 1554   HGB 12.2 06/01/2018 1554   HCT 36.5 06/01/2018 1554   PLT 378.0 06/01/2018 1554   MCV 89.4 06/01/2018 1554   MCH 28.4 05/17/2018 2112   MCHC 33.5 06/01/2018 1554   RDW 14.3 06/01/2018 1554   LYMPHSABS 2.8 06/01/2018 1554   MONOABS 0.5 06/01/2018 1554   EOSABS 0.2 06/01/2018 1554   BASOSABS 0.1 06/01/2018 1554   Iron/TIBC/Ferritin/ %Sat No results found for: IRON, TIBC, FERRITIN, IRONPCTSAT Lipid Panel     Component Value Date/Time   CHOL 201 (H) 11/24/2017 1527   TRIG 117.0 11/24/2017 1527   HDL 45.30 11/24/2017 1527   CHOLHDL 4 11/24/2017 1527   VLDL 23.4 11/24/2017 1527   LDLCALC 133 (H) 11/24/2017 1527   LDLDIRECT 154.1 08/16/2012 0828   Hepatic Function Panel     Component Value Date/Time   PROT 7.5 06/01/2018 1554   PROT 7.2 02/14/2018 1146   ALBUMIN 4.1 06/01/2018 1554   ALBUMIN 4.1 02/14/2018 1146   AST 6 06/01/2018 1554   ALT 13 06/01/2018 1554   ALKPHOS 81 06/01/2018 1554   BILITOT 0.2 06/01/2018 1554   BILITOT 0.3 02/14/2018 1146   BILIDIR <0.1 05/17/2018 2112   IBILI NOT CALCULATED 05/17/2018 2112      Component Value Date/Time   TSH 2.480 02/14/2018 1146   TSH 2.94 10/27/2017 1353   TSH 1.80 05/19/2017 1011   No Results Found For: Vitamin D.   I, Michaelene Song, am acting as Location manager for [employer], FNP-C.  I have reviewed the above documentation for accuracy and completeness, and I agree with the above.  - Gwenna Fuston, FNP-C.

## 2018-08-20 ENCOUNTER — Telehealth: Payer: Self-pay

## 2018-08-20 NOTE — Telephone Encounter (Signed)
Patient declines to increase dose as it made her dizzy

## 2018-08-20 NOTE — Telephone Encounter (Signed)
[id number] 

## 2018-08-20 NOTE — Telephone Encounter (Signed)
Returning call.

## 2018-08-20 NOTE — Telephone Encounter (Signed)
-----   Message from Herminio Commons, MD sent at 08/20/2018  3:58 PM EDT ----- Has paroxysms of NSVT. If still symptomatic, increase Lopressor to 25 mg bid.

## 2018-08-24 ENCOUNTER — Ambulatory Visit (INDEPENDENT_AMBULATORY_CARE_PROVIDER_SITE_OTHER): Payer: BLUE CROSS/BLUE SHIELD

## 2018-08-24 DIAGNOSIS — E538 Deficiency of other specified B group vitamins: Secondary | ICD-10-CM | POA: Diagnosis not present

## 2018-08-24 MED ORDER — CYANOCOBALAMIN 1000 MCG/ML IJ SOLN
1000.0000 ug | Freq: Once | INTRAMUSCULAR | Status: AC
  Administered 2018-08-24: 1000 ug via INTRAMUSCULAR

## 2018-08-24 NOTE — Progress Notes (Signed)
b12 Injection given.   Binnie Rail, MD

## 2018-08-27 ENCOUNTER — Encounter (INDEPENDENT_AMBULATORY_CARE_PROVIDER_SITE_OTHER): Payer: Self-pay | Admitting: Family Medicine

## 2018-08-27 ENCOUNTER — Ambulatory Visit (INDEPENDENT_AMBULATORY_CARE_PROVIDER_SITE_OTHER): Payer: BLUE CROSS/BLUE SHIELD | Admitting: Family Medicine

## 2018-08-27 ENCOUNTER — Other Ambulatory Visit: Payer: Self-pay

## 2018-08-27 DIAGNOSIS — E559 Vitamin D deficiency, unspecified: Secondary | ICD-10-CM | POA: Diagnosis not present

## 2018-08-27 DIAGNOSIS — F3289 Other specified depressive episodes: Secondary | ICD-10-CM | POA: Diagnosis not present

## 2018-08-27 DIAGNOSIS — Z6841 Body Mass Index (BMI) 40.0 and over, adult: Secondary | ICD-10-CM | POA: Diagnosis not present

## 2018-08-27 NOTE — Progress Notes (Signed)
Office: [phone]  /  Fax: [phone] TeleHealth Visit:  [name] has verbally consented to this TeleHealth visit today. The patient is located at work, the provider is located at the [employer] [facility] office. The participants in this visit include the listed provider and patient. The visit was conducted today via FaceTime.  HPI:   Chief Complaint: OBESITY [name] is here to discuss her progress with her obesity treatment plan. She is on the Category 3 plan and is following her eating plan approximately 85% of the time. She states she is exercising 0 minutes 0 times per week. [name] states she weighs 365 lbs today, reflecting a 5 lb weight gain. She has decreased her soda to 1-2 times per day. She reports having recent celebrations and has been off track. She reports she has been "lazy" about sticking to the plan. We were unable to weigh the patient today for this TeleHealth visit. She states her weight today is 365 lbs. She has lost 24 lbs since starting treatment with [place].  Depression with emotional eating behaviors [name] is struggling with emotional eating and using food for comfort to the extent that it is negatively impacting her health. She often snacks when she is not hungry. [name] sometimes feels she is out of control and then feels guilty that she made poor food choices. She has been working on behavior modification techniques to help reduce her emotional eating and has been somewhat successful. She shows no sign of suicidal or homicidal ideations. [name] declines to see Dr. Mallie Mussel. She states she struggles with motivation to stick to diet. She had been on Topamax but had decreased heart rate and blood pressure. Topamax helped her with her cravings.  Depression screen [facility] 2/9 02/14/2018 05/19/2017 08/19/2015  Decreased Interest 3 0 0  Down, Depressed, Hopeless 2 0 0  PHQ - 2 Score 5 0 0  Altered sleeping 2 - -  Tired, decreased energy 3 - -  Change in appetite 2 - -  Feeling bad  or failure about yourself  2 - -  Trouble concentrating 1 - -  Moving slowly or fidgety/restless 2 - -  Suicidal thoughts 1 - -  PHQ-9 Score 18 - -  Difficult doing work/chores Somewhat difficult - -  Some recent data might be hidden   Vitamin D deficiency [name] has a diagnosis of Vitamin D deficiency, which is not at goal. Her last level was reported to be 46 on 11/24/2017. She is currently taking prescription Vit D and denies nausea, vomiting or muscle weakness.  ASSESSMENT AND PLAN:  Vitamin D deficiency  Other depression  Class 3 severe obesity with serious comorbidity and body mass index (BMI) of 50.0 to 59.9 in adult, unspecified obesity type ([place])  PLAN:  Depression with Emotional Eating Behaviors We discussed behavior modification techniques today to help [name] deal with her emotional eating and depression. Shruti will talk with Cardiology about restartingTopamax. She will follow-up with our clinic in 2 weeks.  Vitamin D Deficiency [name] was informed that low Vitamin D levels contributes to fatigue and are associated with obesity, breast, and colon cancer. She agrees to continue to take prescription Vit D and will follow-up for routine testing of Vitamin D, at least 2-3 times per year. She was informed of the risk of over-replacement of Vitamin D and agrees to not increase her dose unless she discusses this with [place] first. [name] agrees to follow-up with our clinic in 2 weeks.  Obesity [name] is  currently in the action stage of change. As such, her goal is to continue with weight loss efforts. She has agreed to follow the Category 3 plan. [name] will decrease soda to 1 per day.  She will eat [place] [employer] Delight + 1 boiled egg at breakfast. She will follow Category 3 at lunch and will eat a normal dinner using portion control. [name] has not been prescribed exercise at this time. We discussed the following Behavioral Modification Strategies today: increasing lean protein intake, increase  H20 intake, decrease liquid calories, emotional eating strategies, and celebration eating strategies.  [name] has agreed to follow-up with our clinic in 2 weeks. She was informed of the importance of frequent follow-up visits to maximize her success with intensive lifestyle modifications for her multiple health conditions.  ALLERGIES: No Known Allergies  MEDICATIONS: Current Outpatient Medications on File Prior to Visit  Medication Sig Dispense Refill  . ALPRAZolam (XANAX) 0.5 MG tablet Take 1 tablet (0.5 mg total) by mouth 3 times/day as needed-between meals & bedtime for anxiety. 40 tablet 0  . Cyanocobalamin (B-12 COMPLIANCE INJECTION) 1000 MCG/ML KIT Inject 1,000 mcg as directed every 30 (thirty) days.    . fluconazole (DIFLUCAN) 150 MG tablet Take one dose, repeat after 3 days if needed 2 tablet 0  . losartan (COZAAR) 50 MG tablet Take 1 tablet (50 mg total) by mouth daily. 30 tablet 11  . metFORMIN (GLUCOPHAGE) 500 MG tablet Take 1 tablet (500 mg total) by mouth 2 (two) times daily with a meal. 180 tablet 3  . metoprolol tartrate (LOPRESSOR) 25 MG tablet Take 12.5 mg by mouth 2 (two) times daily.    . pantoprazole (PROTONIX) 40 MG tablet TAKE 1 TABLET BY MOUTH EVERY DAY 90 tablet 3  . PARoxetine (PAXIL) 20 MG tablet TAKE 1 TABLET BY MOUTH EVERY DAY 90 tablet 1  . UNABLE TO FIND Med Name: CPAP    . valACYclovir (VALTREX) 1000 MG tablet TAKE 2 TABLETS EVERY 12 HOURS FOR 1 DAY START ASAP AFTER SYTPTOM ONSET 12 tablet 11  . Vitamin D, Ergocalciferol, (DRISDOL) 1.25 MG (50000 UT) CAPS capsule Take 1 capsule (50,000 Units total) by mouth every 7 (seven) days. 4 capsule 0   No current facility-administered medications on file prior to visit.     PAST MEDICAL HISTORY: Past Medical History:  Diagnosis Date  . Anxiety   . ASTHMA   . B12 deficiency   . DELAYED GASTRIC EMPTYING   . DEPRESSION   . GERD   . Glucose intolerance (impaired glucose tolerance) 04/09/2015  . Hypertension   .  OBESITY   . OBSTRUCTIVE SLEEP APNEA   . POLYCYSTIC OVARIAN DISEASE   . Prediabetes   . SMOKER   . SOB (shortness of breath)   . SVT (supraventricular tachycardia) ([place])   . Tachycardia   . Umbilical hernia   . Umbilical hernia   . Vitamin B12 deficiency 03/2015 dx   start IM replacement q 30d  . Vitamin D deficiency 03/2015 dx    PAST SURGICAL HISTORY: Past Surgical History:  Procedure Laterality Date  . CESAREAN SECTION  97 & 98   x's 2  . Cocxyl removal  1995  . DILATION AND CURETTAGE OF UTERUS    . LAPAROSCOPY    . TUBAL LIGATION  1998    SOCIAL HISTORY: Social History   Tobacco Use  . Smoking status: Former Smoker    Packs/day: 0.25    Years: 30.00    Pack years: 7.50  Types: Cigarettes    Start date: 10/23/1987    Last attempt to quit: 10/23/2017    Years since quitting: 0.8  . Smokeless tobacco: Never Used  Substance Use Topics  . Alcohol use: Yes    Alcohol/week: 0.0 standard drinks    Comment: Rarely  . Drug use: No    FAMILY HISTORY: Family History  Problem Relation Age of Onset  . Breast cancer Mother        dx 11/2009  . Diabetes Mother   . High blood pressure Mother   . Thyroid disease Mother   . Anxiety disorder Mother   . Obesity Mother   . Diabetes Maternal Aunt   . Prostate cancer Maternal Grandfather 34       also hx colon polyps  . Heart disease Paternal Grandmother   . Pancreatic cancer Paternal Grandmother 17  . High blood pressure Father   . High Cholesterol Father   . Depression Father   . Heart murmur Sister   . Adrenal disorder Neg Hx    ROS: Review of Systems  Gastrointestinal: Negative for nausea and vomiting.  Musculoskeletal:       Negative for muscle weakness.  Psychiatric/Behavioral: Positive for depression (emotional eating). Negative for suicidal ideas.       Negative for homicidal ideas.   PHYSICAL EXAM: Pt in no acute distress  RECENT LABS AND TESTS: BMET    Component Value Date/Time   NA 139 06/12/2018  0942   NA 139 02/14/2018 1146   K 3.8 06/12/2018 0942   CL 107 06/12/2018 0942   CO2 25 06/12/2018 0942   GLUCOSE 113 (H) 06/12/2018 0942   BUN 14 06/12/2018 0942   BUN 12 02/14/2018 1146   CREATININE 0.84 06/12/2018 0942   CALCIUM 8.5 (L) 06/12/2018 0942   GFRNONAA >60 06/12/2018 0942   GFRAA >60 06/12/2018 0942   Lab Results  Component Value Date   HGBA1C 5.7 (H) 02/14/2018   HGBA1C 6.1 11/24/2017   HGBA1C 5.9 05/19/2017   HGBA1C 5.9 05/25/2016   HGBA1C 5.9 10/14/2015   Lab Results  Component Value Date   INSULIN 19.1 02/14/2018   CBC    Component Value Date/Time   WBC 10.9 (H) 06/01/2018 1554   RBC 4.08 06/01/2018 1554   HGB 12.2 06/01/2018 1554   HCT 36.5 06/01/2018 1554   PLT 378.0 06/01/2018 1554   MCV 89.4 06/01/2018 1554   MCH 28.4 05/17/2018 2112   MCHC 33.5 06/01/2018 1554   RDW 14.3 06/01/2018 1554   LYMPHSABS 2.8 06/01/2018 1554   MONOABS 0.5 06/01/2018 1554   EOSABS 0.2 06/01/2018 1554   BASOSABS 0.1 06/01/2018 1554   Iron/TIBC/Ferritin/ %Sat No results found for: IRON, TIBC, FERRITIN, IRONPCTSAT Lipid Panel     Component Value Date/Time   CHOL 201 (H) 11/24/2017 1527   TRIG 117.0 11/24/2017 1527   HDL 45.30 11/24/2017 1527   CHOLHDL 4 11/24/2017 1527   VLDL 23.4 11/24/2017 1527   LDLCALC 133 (H) 11/24/2017 1527   LDLDIRECT 154.1 08/16/2012 0828   Hepatic Function Panel     Component Value Date/Time   PROT 7.5 06/01/2018 1554   PROT 7.2 02/14/2018 1146   ALBUMIN 4.1 06/01/2018 1554   ALBUMIN 4.1 02/14/2018 1146   AST 6 06/01/2018 1554   ALT 13 06/01/2018 1554   ALKPHOS 81 06/01/2018 1554   BILITOT 0.2 06/01/2018 1554   BILITOT 0.3 02/14/2018 1146   BILIDIR <0.1 05/17/2018 2112   IBILI NOT CALCULATED 05/17/2018 2112  Component Value Date/Time   TSH 2.480 02/14/2018 1146   TSH 2.94 10/27/2017 1353   TSH 1.80 05/19/2017 1011   No results found for Vitamin D 25-Hydroxy   I, Michaelene Song, am acting as Location manager for [employer], FNP-C.  I have reviewed the above documentation for accuracy and completeness, and I agree with the above.  - Leandro Berkowitz, FNP-C.

## 2018-09-10 ENCOUNTER — Ambulatory Visit (INDEPENDENT_AMBULATORY_CARE_PROVIDER_SITE_OTHER): Payer: BLUE CROSS/BLUE SHIELD | Admitting: Family Medicine

## 2018-09-10 ENCOUNTER — Encounter (INDEPENDENT_AMBULATORY_CARE_PROVIDER_SITE_OTHER): Payer: Self-pay | Admitting: Family Medicine

## 2018-09-10 ENCOUNTER — Other Ambulatory Visit: Payer: Self-pay

## 2018-09-10 DIAGNOSIS — E66813 Obesity, class 3: Secondary | ICD-10-CM

## 2018-09-10 DIAGNOSIS — R7303 Prediabetes: Secondary | ICD-10-CM

## 2018-09-10 DIAGNOSIS — Z6841 Body Mass Index (BMI) 40.0 and over, adult: Secondary | ICD-10-CM

## 2018-09-10 DIAGNOSIS — E559 Vitamin D deficiency, unspecified: Secondary | ICD-10-CM

## 2018-09-10 MED ORDER — VITAMIN D (ERGOCALCIFEROL) 1.25 MG (50000 UNIT) PO CAPS: 50000.0000 [IU] | ORAL_CAPSULE | ORAL | 0 refills | Status: DC

## 2018-09-11 ENCOUNTER — Encounter (INDEPENDENT_AMBULATORY_CARE_PROVIDER_SITE_OTHER): Payer: Self-pay | Admitting: Family Medicine

## 2018-09-11 NOTE — Progress Notes (Signed)
Office: [phone]  /  Fax: [phone] TeleHealth Visit:  [name] has verbally consented to this TeleHealth visit today. The patient is located at work, the provider is located at the [employer] [facility] office. The participants in this visit include the listed provider and patient. The visit was conducted today via FaceTime.  HPI:   Chief Complaint: OBESITY [name] is here to discuss her progress with her obesity treatment plan. She is on the Category 3 plan. She states she is exercising 0 minutes 0 times per week. [name] states she has lost 4 lbs and weighs 361 lbs today. She has done better on the plan over the last few weeks. She reports having less social activities involving food. She has decreased her sweetened beverages and is not having those daily. We were unable to weigh the patient today for this TeleHealth visit. She states her weight today is 361 lbs. She has lost 24 lbs since starting treatment with [place].  Vitamin D deficiency [name] has a diagnosis of Vitamin D deficiency, which is not at goal. Her last Vitamin D level was reported to be 46.07 on 11/24/2017. She is currently taking prescription Vit D and denies nausea, vomiting or muscle weakness.  Pre-Diabetes [name] has a diagnosis of prediabetes based on her elevated Hgb A1c and was informed this puts her at greater risk of developing diabetes. She is taking metformin currently and continues to work on diet and exercise to decrease risk of diabetes. She reports polyphagia at night. Lab Results  Component Value Date   HGBA1C 5.7 (H) 02/14/2018    ASSESSMENT AND PLAN:  Vitamin D deficiency - Plan: Vitamin D, Ergocalciferol, (DRISDOL) 1.25 MG (50000 UT) CAPS capsule  Prediabetes  Class 3 severe obesity with serious comorbidity and body mass index (BMI) of 50.0 to 59.9 in adult, unspecified obesity type ([place])  PLAN:  Vitamin D Deficiency [name] was informed that low Vitamin D levels contributes to fatigue and are  associated with obesity, breast, and colon cancer. She agrees to continue to take prescription Vit D @ 50,000 IU every week #4 with 0 refills and will follow-up for routine testing of Vitamin D at her next office visit. She was informed of the risk of over-replacement of Vitamin D and agrees to not increase her dose unless she discusses this with [place] first. [name] agrees to follow-up with our clinic in 2 weeks for an in office visit.  Pre-Diabetes [name] will continue to work on weight loss, exercise, and decreasing simple carbohydrates in her diet to help decrease the risk of diabetes.  [name] will have her A1c and fasting insulin checked at her next visit. She was advised to increase her protein at dinner.  Obesity [name] is currently in the action stage of change. As such, her goal is to continue with weight loss efforts. She has agreed to continue the plan discussed on 08/27/2018 (revised Category 3).  She will have a Health and safety inspector and 1 egg at breakfast, follow Category 3 for lunch, and observe portion control at dinner. [name] has not been prescribed exercise at this time. We discussed the following Behavioral Modification Strategies today: increasing lean protein intake, better snacking choices, and planning for success.  [name] has agreed to follow-up with our clinic in 2 weeks. She was informed of the importance of frequent follow-up visits to maximize her success with intensive lifestyle modifications for her multiple health conditions.  ALLERGIES: No Known Allergies  MEDICATIONS: Current Outpatient Medications on File  Prior to Visit  Medication Sig Dispense Refill  . ALPRAZolam (XANAX) 0.5 MG tablet Take 1 tablet (0.5 mg total) by mouth 3 times/day as needed-between meals & bedtime for anxiety. 40 tablet 0  . Cyanocobalamin (B-12 COMPLIANCE INJECTION) 1000 MCG/ML KIT Inject 1,000 mcg as directed every 30 (thirty) days.    . fluconazole (DIFLUCAN) 150 MG tablet Take one dose, repeat after 3  days if needed 2 tablet 0  . losartan (COZAAR) 50 MG tablet Take 1 tablet (50 mg total) by mouth daily. 30 tablet 11  . metFORMIN (GLUCOPHAGE) 500 MG tablet Take 1 tablet (500 mg total) by mouth 2 (two) times daily with a meal. 180 tablet 3  . metoprolol tartrate (LOPRESSOR) 25 MG tablet Take 12.5 mg by mouth 2 (two) times daily.    . pantoprazole (PROTONIX) 40 MG tablet TAKE 1 TABLET BY MOUTH EVERY DAY 90 tablet 3  . PARoxetine (PAXIL) 20 MG tablet TAKE 1 TABLET BY MOUTH EVERY DAY 90 tablet 1  . UNABLE TO FIND Med Name: CPAP    . valACYclovir (VALTREX) 1000 MG tablet TAKE 2 TABLETS EVERY 12 HOURS FOR 1 DAY START ASAP AFTER SYTPTOM ONSET 12 tablet 11   No current facility-administered medications on file prior to visit.     PAST MEDICAL HISTORY: Past Medical History:  Diagnosis Date  . Anxiety   . ASTHMA   . B12 deficiency   . DELAYED GASTRIC EMPTYING   . DEPRESSION   . GERD   . Glucose intolerance (impaired glucose tolerance) 04/09/2015  . Hypertension   . OBESITY   . OBSTRUCTIVE SLEEP APNEA   . POLYCYSTIC OVARIAN DISEASE   . Prediabetes   . SMOKER   . SOB (shortness of breath)   . SVT (supraventricular tachycardia) ([place])   . Tachycardia   . Umbilical hernia   . Umbilical hernia   . Vitamin B12 deficiency 03/2015 dx   start IM replacement q 30d  . Vitamin D deficiency 03/2015 dx    PAST SURGICAL HISTORY: Past Surgical History:  Procedure Laterality Date  . CESAREAN SECTION  97 & 98   x's 2  . Cocxyl removal  1995  . DILATION AND CURETTAGE OF UTERUS    . LAPAROSCOPY    . TUBAL LIGATION  1998    SOCIAL HISTORY: Social History   Tobacco Use  . Smoking status: Former Smoker    Packs/day: 0.25    Years: 30.00    Pack years: 7.50    Types: Cigarettes    Start date: 10/23/1987    Last attempt to quit: 10/23/2017    Years since quitting: 0.8  . Smokeless tobacco: Never Used  Substance Use Topics  . Alcohol use: Yes    Alcohol/week: 0.0 standard drinks     Comment: Rarely  . Drug use: No    FAMILY HISTORY: Family History  Problem Relation Age of Onset  . Breast cancer Mother        dx 11/2009  . Diabetes Mother   . High blood pressure Mother   . Thyroid disease Mother   . Anxiety disorder Mother   . Obesity Mother   . Diabetes Maternal Aunt   . Prostate cancer Maternal Grandfather 27       also hx colon polyps  . Heart disease Paternal Grandmother   . Pancreatic cancer Paternal Grandmother 74  . High blood pressure Father   . High Cholesterol Father   . Depression Father   . Heart murmur  Sister   . Adrenal disorder Neg Hx    ROS: Review of Systems  Gastrointestinal: Negative for abdominal pain and vomiting.  Musculoskeletal:       Negative for muscle weakness.  Endo/Heme/Allergies:       Positive for polyphagia at night.   PHYSICAL EXAM: Pt in no acute distress  RECENT LABS AND TESTS: BMET    Component Value Date/Time   NA 139 06/12/2018 0942   NA 139 02/14/2018 1146   K 3.8 06/12/2018 0942   CL 107 06/12/2018 0942   CO2 25 06/12/2018 0942   GLUCOSE 113 (H) 06/12/2018 0942   BUN 14 06/12/2018 0942   BUN 12 02/14/2018 1146   CREATININE 0.84 06/12/2018 0942   CALCIUM 8.5 (L) 06/12/2018 0942   GFRNONAA >60 06/12/2018 0942   GFRAA >60 06/12/2018 0942   Lab Results  Component Value Date   HGBA1C 5.7 (H) 02/14/2018   HGBA1C 6.1 11/24/2017   HGBA1C 5.9 05/19/2017   HGBA1C 5.9 05/25/2016   HGBA1C 5.9 10/14/2015   Lab Results  Component Value Date   INSULIN 19.1 02/14/2018   CBC    Component Value Date/Time   WBC 10.9 (H) 06/01/2018 1554   RBC 4.08 06/01/2018 1554   HGB 12.2 06/01/2018 1554   HCT 36.5 06/01/2018 1554   PLT 378.0 06/01/2018 1554   MCV 89.4 06/01/2018 1554   MCH 28.4 05/17/2018 2112   MCHC 33.5 06/01/2018 1554   RDW 14.3 06/01/2018 1554   LYMPHSABS 2.8 06/01/2018 1554   MONOABS 0.5 06/01/2018 1554   EOSABS 0.2 06/01/2018 1554   BASOSABS 0.1 06/01/2018 1554   Iron/TIBC/Ferritin/ %Sat  No results found for: IRON, TIBC, FERRITIN, IRONPCTSAT Lipid Panel     Component Value Date/Time   CHOL 201 (H) 11/24/2017 1527   TRIG 117.0 11/24/2017 1527   HDL 45.30 11/24/2017 1527   CHOLHDL 4 11/24/2017 1527   VLDL 23.4 11/24/2017 1527   LDLCALC 133 (H) 11/24/2017 1527   LDLDIRECT 154.1 08/16/2012 0828   Hepatic Function Panel     Component Value Date/Time   PROT 7.5 06/01/2018 1554   PROT 7.2 02/14/2018 1146   ALBUMIN 4.1 06/01/2018 1554   ALBUMIN 4.1 02/14/2018 1146   AST 6 06/01/2018 1554   ALT 13 06/01/2018 1554   ALKPHOS 81 06/01/2018 1554   BILITOT 0.2 06/01/2018 1554   BILITOT 0.3 02/14/2018 1146   BILIDIR <0.1 05/17/2018 2112   IBILI NOT CALCULATED 05/17/2018 2112      Component Value Date/Time   TSH 2.480 02/14/2018 1146   TSH 2.94 10/27/2017 1353   TSH 1.80 05/19/2017 1011   Results for REGIS, WILAND (MRN [id number]) as of 09/11/2018 08:49  Ref. Range 11/24/2017 15:27  VITD Latest Ref Range: 30.00 - 100.00 ng/mL 46.07    I, Michaelene Song, am acting as Location manager for [employer], FNP-C.  I have reviewed the above documentation for accuracy and completeness, and I agree with the above.  - Oluwademilade Mckiver, FNP-C.

## 2018-09-12 ENCOUNTER — Emergency Department (HOSPITAL_COMMUNITY): Payer: BC Managed Care – PPO

## 2018-09-12 ENCOUNTER — Emergency Department (HOSPITAL_COMMUNITY)
Admission: EM | Admit: 2018-09-12 | Discharge: 2018-09-12 | Disposition: A | Discharge status: Home / Self Care | Payer: BC Managed Care – PPO | Attending: Emergency Medicine | Admitting: Emergency Medicine

## 2018-09-12 ENCOUNTER — Encounter (HOSPITAL_COMMUNITY): Payer: Self-pay | Admitting: Emergency Medicine

## 2018-09-12 ENCOUNTER — Other Ambulatory Visit: Payer: Self-pay

## 2018-09-12 DIAGNOSIS — Z79899 Other long term (current) drug therapy: Secondary | ICD-10-CM | POA: Diagnosis not present

## 2018-09-12 DIAGNOSIS — J45991 Cough variant asthma: Secondary | ICD-10-CM | POA: Diagnosis not present

## 2018-09-12 DIAGNOSIS — I10 Essential (primary) hypertension: Secondary | ICD-10-CM | POA: Insufficient documentation

## 2018-09-12 DIAGNOSIS — Z87891 Personal history of nicotine dependence: Secondary | ICD-10-CM | POA: Insufficient documentation

## 2018-09-12 DIAGNOSIS — R1010 Upper abdominal pain, unspecified: Secondary | ICD-10-CM

## 2018-09-12 DIAGNOSIS — K429 Umbilical hernia without obstruction or gangrene: Secondary | ICD-10-CM

## 2018-09-12 DIAGNOSIS — R1011 Right upper quadrant pain: Secondary | ICD-10-CM | POA: Diagnosis present

## 2018-09-12 LAB — COMPREHENSIVE METABOLIC PANEL
ALT: 15 U/L (ref 0–44)
AST: 10 U/L — ABNORMAL LOW (ref 15–41)
Albumin: 3.8 g/dL (ref 3.5–5.0)
Alkaline Phosphatase: 83 U/L (ref 38–126)
Anion gap: 12 (ref 5–15)
BUN: 12 mg/dL (ref 6–20)
CO2: 24 mmol/L (ref 22–32)
Calcium: 9 mg/dL (ref 8.9–10.3)
Chloride: 103 mmol/L (ref 98–111)
Creatinine, Ser: 0.97 mg/dL (ref 0.44–1.00)
GFR calc Af Amer: >60 mL/min (ref >60)
GFR calc non Af Amer: >60 mL/min (ref >60)
Glucose, Bld: 115 mg/dL — ABNORMAL HIGH (ref 70–99)
Potassium: 4.2 mmol/L (ref 3.5–5.1)
Sodium: 139 mmol/L (ref 135–145)
Total Bilirubin: 0.3 mg/dL (ref 0.3–1.2)
Total Protein: 7.8 g/dL (ref 6.5–8.1)

## 2018-09-12 LAB — URINALYSIS, ROUTINE W REFLEX MICROSCOPIC
Bilirubin Urine: NEGATIVE
Glucose, UA: NEGATIVE mg/dL
Hgb urine dipstick: NEGATIVE
Ketones, ur: NEGATIVE mg/dL
Leukocytes,Ua: NEGATIVE
Nitrite: NEGATIVE
Protein, ur: NEGATIVE mg/dL
Specific Gravity, Urine: 1.012 (ref 1.005–1.030)
pH: 7 (ref 5.0–8.0)

## 2018-09-12 LAB — CBC
HCT: 39 % (ref 36.0–46.0)
Hemoglobin: 12.1 g/dL (ref 12.0–15.0)
MCH: 29.2 pg (ref 26.0–34.0)
MCHC: 31 g/dL (ref 30.0–36.0)
MCV: 94.2 fL (ref 80.0–100.0)
Platelets: 386 10*3/uL (ref 150–400)
RBC: 4.14 MIL/uL (ref 3.87–5.11)
RDW: 14 % (ref 11.5–15.5)
WBC: 9.8 10*3/uL (ref 4.0–10.5)
nRBC: 0 % (ref 0.0–0.2)

## 2018-09-12 LAB — LIPASE, BLOOD: Lipase: 31 U/L (ref 11–51)

## 2018-09-12 LAB — POC URINE PREG, ED: Preg Test, Ur: NEGATIVE

## 2018-09-12 MED ORDER — IOHEXOL 300 MG/ML  SOLN
100.0000 mL | Freq: Once | INTRAMUSCULAR | Status: AC | PRN
  Administered 2018-09-12: 100 mL via INTRAVENOUS

## 2018-09-12 MED ORDER — ONDANSETRON HCL 4 MG/2ML IJ SOLN
4.0000 mg | Freq: Once | INTRAMUSCULAR | Status: AC
  Administered 2018-09-12: 4 mg via INTRAVENOUS
  Filled 2018-09-12: qty 2

## 2018-09-12 MED ORDER — ONDANSETRON HCL 4 MG PO TABS: 4.0000 mg | ORAL_TABLET | Freq: Three times a day (TID) | ORAL | 0 refills | Status: DC | PRN

## 2018-09-12 NOTE — ED Provider Notes (Signed)
[facility] EMERGENCY DEPARTMENT Provider Note   CSN: [id number] Arrival date & time: 09/12/18  9563    History   Chief Complaint Chief Complaint  Patient presents with  . Abdominal Pain    HPI [name] is a 48 y.o. female.     HPI   [name] is a 49 y.o. female with a hx of HTN, umbicial hernia, and GERD who presents to the Emergency Department complaining of RUQ and epigastric pain that began last evening.  Her pain has been associated with one episode of loose stool and one episode of vomiting this morning that relieved her symptoms.  She describes the pain as sharp and radiates into her right back.  Pain worse with standing.  Pain unchanged with food intake.  She denies fever, chills, chest pain, dyspnea, and urinary symptoms.      Past Medical History:  Diagnosis Date  . Anxiety   . ASTHMA   . B12 deficiency   . DELAYED GASTRIC EMPTYING   . DEPRESSION   . GERD   . Glucose intolerance (impaired glucose tolerance) 04/09/2015  . Hypertension   . OBESITY   . OBSTRUCTIVE SLEEP APNEA   . POLYCYSTIC OVARIAN DISEASE   . Prediabetes   . SMOKER   . SOB (shortness of breath)   . SVT (supraventricular tachycardia) ([place])   . Tachycardia   . Umbilical hernia   . Umbilical hernia   . Vitamin B12 deficiency 03/2015 dx   start IM replacement q 30d  . Vitamin D deficiency 03/2015 dx    Patient Active Problem List   Diagnosis Date Noted  . Skin abnormalities 11/25/2017  . 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 Skin tag 11/25/2017  . Essential hypertension 11/01/2017  . Chronic frontal sinusitis 10/30/2017  . Submandibular gland mass 10/30/2017  . Prediabetes 05/18/2017  . SOB (shortness of breath) 09/24/2016  . Palpitations 09/24/2016  . Family history of early CAD 03/23/2016  . SVT (supraventricular tachycardia) ([place]) 03/14/2016  . Adrenal nodule ([place]) 01/06/2016  . Umbilical hernia 87/56/4332  . Morbid obesity ([place]) 04/09/2015  . Vitamin D deficiency    . Vitamin B12 deficiency   . Idiopathic intracranial hypertension 05/24/2013  . Anxiety   . Cigarette smoker 01/28/2009  . Depression 01/28/2009  . GERD 01/28/2009  . POLYCYSTIC OVARIAN DISEASE 02/10/2007  . Class 3 severe obesity with serious comorbidity and body mass index (BMI) of 50.0 to 59.9 in adult ([place]) 02/10/2007  . Obstructive sleep apnea 02/10/2007  . Cough variant asthma 02/10/2007    Past Surgical History:  Procedure Laterality Date  . CESAREAN SECTION  97 & 98   x's 2  . Cocxyl removal  1995  . DILATION AND CURETTAGE OF UTERUS    . LAPAROSCOPY    . TUBAL LIGATION  1998     OB History    Gravida  3   Para  2   Term  2   Preterm      AB  1   Living        SAB      TAB  1   Ectopic      Multiple      Live Births               Home Medications    Prior to Admission medications   Medication Sig Start Date End Date Taking? Authorizing Provider  ALPRAZolam Duanne Moron) 0.5 MG tablet Take 1 tablet (0.5 mg total) by mouth  3 times/day as needed-between meals & bedtime for anxiety. 06/08/18   Binnie Rail, MD  Cyanocobalamin (B-12 COMPLIANCE INJECTION) 1000 MCG/ML KIT Inject 1,000 mcg as directed every 30 (thirty) days.    [provider]  fluconazole (DIFLUCAN) 150 MG tablet Take one dose, repeat after 3 days if needed 06/08/18   Binnie Rail, MD  losartan (COZAAR) 50 MG tablet Take 1 tablet (50 mg total) by mouth daily. 10/31/17   Tanda Rockers, MD  metFORMIN (GLUCOPHAGE) 500 MG tablet Take 1 tablet (500 mg total) by mouth 2 (two) times daily with a meal. 06/08/18   Burns, Claudina Lick, MD  metoprolol tartrate (LOPRESSOR) 25 MG tablet Take 12.5 mg by mouth 2 (two) times daily.    [provider]  pantoprazole (PROTONIX) 40 MG tablet TAKE 1 TABLET BY MOUTH EVERY DAY 05/03/18   Nandigam, Venia Minks, MD  PARoxetine (PAXIL) 20 MG tablet TAKE 1 TABLET BY MOUTH EVERY DAY 05/28/18   Burns, Claudina Lick, MD  UNABLE TO FIND Med Name: CPAP    [provider]  valACYclovir (VALTREX) 1000 MG tablet TAKE 2 TABLETS EVERY 12 HOURS FOR 1 DAY START ASAP AFTER SYTPTOM ONSET 07/31/18   Binnie Rail, MD  Vitamin D, Ergocalciferol, (DRISDOL) 1.25 MG (50000 UT) CAPS capsule Take 1 capsule (50,000 Units total) by mouth every 7 (seven) days. 09/10/18   [place], Joneen Boers, FNP    Family History Family History  Problem Relation Age of Onset  . Breast cancer Mother        dx 11/2009  . Diabetes Mother   . High blood pressure Mother   . Thyroid disease Mother   . Anxiety disorder Mother   . Obesity Mother   . Diabetes Maternal Aunt   . Prostate cancer Maternal Grandfather 32       also hx colon polyps  . Heart disease Paternal Grandmother   . Pancreatic cancer Paternal Grandmother 58  . High blood pressure Father   . High Cholesterol Father   . Depression Father   . Heart murmur Sister   . Adrenal disorder Neg Hx     Social History Social History   Tobacco Use  . Smoking status: Former Smoker    Packs/day: 0.25    Years: 30.00    Pack years: 7.50    Types: Cigarettes    Start date: 10/23/1987    Last attempt to quit: 10/23/2017    Years since quitting: 0.8  . Smokeless tobacco: Never Used  Substance Use Topics  . Alcohol use: Yes    Alcohol/week: 0.0 standard drinks    Comment: Rarely  . Drug use: No     Allergies   Patient has no known allergies.   Review of Systems Review of Systems  Constitutional: Negative for appetite change, chills and fever.  HENT: Negative for sore throat and trouble swallowing.   Respiratory: Negative for chest tightness and shortness of breath.   Cardiovascular: Negative for chest pain.  Gastrointestinal: Positive for abdominal pain, diarrhea, nausea and vomiting. Negative for blood in stool.  Genitourinary: Negative for decreased urine volume, difficulty urinating, dysuria and flank pain.  Musculoskeletal: Negative for back pain.  Skin: Negative for color change and rash.  Neurological:  Negative for dizziness, weakness and numbness.  Hematological: Negative for adenopathy.  Psychiatric/Behavioral: Negative for agitation.     Physical Exam Updated Vital Signs BP (!) 119/57   Pulse 80   Temp 98.1 F (36.7 C)  Resp 19   Ht 5' 5"  (1.651 m)   Wt (!) 163.3 kg   SpO2 93%   BMI 59.91 kg/m   Physical Exam Vitals signs and nursing note reviewed.  Constitutional:      General: She is not in acute distress.    Appearance: She is well-developed. She is obese.  HENT:     Head: Normocephalic.  Neck:     Musculoskeletal: Normal range of motion.  Cardiovascular:     Rate and Rhythm: Normal rate and regular rhythm.     Heart sounds: No murmur.  Pulmonary:     Effort: Pulmonary effort is normal. No respiratory distress.     Breath sounds: Normal breath sounds.  Abdominal:     General: Bowel sounds are normal. There is no distension.     Palpations: Abdomen is soft. There is no mass.     Tenderness: There is abdominal tenderness in the right upper quadrant and epigastric area. There is no right CVA tenderness, left CVA tenderness, guarding or rebound.  Musculoskeletal: Normal range of motion.     Right lower leg: No edema.     Left lower leg: No edema.  Skin:    General: Skin is warm.     Findings: No rash.  Neurological:     General: No focal deficit present.     Mental Status: She is alert.     Sensory: No sensory deficit.     Motor: No weakness or abnormal muscle tone.     Coordination: Coordination normal.      ED Treatments / Results  Labs (all labs ordered are listed, but only abnormal results are displayed) Labs Reviewed  COMPREHENSIVE METABOLIC PANEL - Abnormal; Notable for the following components:      Result Value   Glucose, Bld 115 (*)    AST 10 (*)    All other components within normal limits  LIPASE, BLOOD  CBC  URINALYSIS, ROUTINE W REFLEX MICROSCOPIC  POC URINE PREG, ED    EKG None  Radiology Ct Abdomen Pelvis W Contrast   Result Date: 09/12/2018 CLINICAL DATA:  Periumbilical pain for several hours EXAM: CT ABDOMEN AND PELVIS WITH CONTRAST TECHNIQUE: Multidetector CT imaging of the abdomen and pelvis was performed using the standard protocol following bolus administration of intravenous contrast. CONTRAST:  135m OMNIPAQUE 300 COMPARISON:  03/24/2016 FINDINGS: Lower chest: No acute abnormality. Hepatobiliary: No focal liver abnormality is seen. No gallstones, gallbladder wall thickening, or biliary dilatation. Pancreas: Unremarkable. No pancreatic ductal dilatation or surrounding inflammatory changes. Spleen: Normal in size without focal abnormality. Adrenals/Urinary Tract: Right adrenal gland is stable in appearance. A hypodense lesion in the left adrenal gland is noted measuring 3.5 cm in greatest dimension. This has enlarged in size from 2.7 cm on the prior exam. The kidneys are well visualized bilaterally. No renal calculi or obstructive changes are noted. Normal excretion of contrast material is noted. The bladder is partially distended. Stomach/Bowel: The appendix is within normal limits. No obstructive or inflammatory changes of large or small bowel are seen. Vascular/Lymphatic: No significant vascular findings are present. No enlarged abdominal or pelvic lymph nodes. Reproductive: Uterus and bilateral adnexa are unremarkable. Other: Large fat containing anterior abdominal wall hernia is noted just above the umbilicus. The neck measures approximately 3 cm in greatest dimension. No bowel is noted within. Musculoskeletal: No acute or significant osseous findings. IMPRESSION: Fat containing supraumbilical hernia in the midline. No bowel is noted within. Hypodense lesion within the left adrenal gland.  This again likely represents adenoma but has increased in size by 8 mm when compared with the prior exam. Electronically Signed   By: Inez Catalina M.D.   On: 09/12/2018 12:26   US Abdomen Limited  Result Date: 09/12/2018 CLINICAL  DATA:  Right upper quadrant pain with nausea and vomiting beginning this morning. EXAM: ULTRASOUND ABDOMEN LIMITED RIGHT UPPER QUADRANT COMPARISON:  CT abdomen and pelvis 03/24/2016. FINDINGS: Gallbladder: No gallstones or wall thickening visualized. No sonographic Murphy sign noted by sonographer. Common bile duct: Diameter: 0.5 cm Liver: No focal lesion. Echogenicity is diffusely increased. Portal vein is patent on color Doppler imaging with normal direction of blood flow towards the liver. IMPRESSION: Negative for gallstones.  No acute abnormality. Fatty infiltration of liver. Electronically Signed   By: Inge Rise M.D.   On: 09/12/2018 10:20    Procedures Procedures (including critical care time)  Medications Ordered in ED Medications  ondansetron (ZOFRAN) injection 4 mg (has no administration in time range)     Initial Impression / Assessment and Plan / ED Course  I have reviewed the triage vital signs and the nursing notes.  Pertinent labs & imaging results that were available during my care of the patient were reviewed by me and considered in my medical decision making (see chart for details).        Pt is non-toxic appearing, vital reviewed.  Labs, Abdominal US and CT abd/pelvis are reassuring.  Stable umbilical hernia. Pt is aware of lesion of left adrenal gland, I have advised her of increasing size noted on CT and importance of close PCP f/u of this.  Pt reports feeling better.  Appears appropriate for d/c home.  Agrees to plan, return precautions discussed.  Final Clinical Impressions(s) / ED Diagnoses   Final diagnoses:  Pain of upper abdomen  Umbilical hernia without obstruction and without gangrene    ED Discharge Orders    None       Kem Parkinson, PA-C 09/13/18 2149    Francine Graven, DO 09/21/18 1640

## 2018-09-12 NOTE — ED Triage Notes (Addendum)
Pt from home. Woke up 0600 with abdominal pain, n/v/d. Has umbilical hernia and states "when I vomit, I feel better"

## 2018-09-12 NOTE — ED Notes (Signed)
Patient transported to CT 

## 2018-09-12 NOTE — Discharge Instructions (Addendum)
As discussed, the mass on your left adrenal gland has increased in size from your previous imaging.  This was an incidental finding on your CT today and doubtful this is related to your abdominal pain today.  You will need to follow-up with your primary provider regarding this.

## 2018-09-13 ENCOUNTER — Encounter: Payer: Self-pay | Admitting: Internal Medicine

## 2018-09-13 DIAGNOSIS — D3500 Benign neoplasm of unspecified adrenal gland: Secondary | ICD-10-CM

## 2018-09-18 ENCOUNTER — Telehealth: Payer: Self-pay | Admitting: Cardiovascular Disease

## 2018-09-18 NOTE — Telephone Encounter (Signed)
Virtual Visit Pre-Appointment Phone Call  "(Name), I am calling you today to discuss your upcoming appointment. We are currently trying to limit exposure to the virus that causes COVID-19 by seeing patients at home rather than in the office."  1. "What is the BEST phone number to call the day of the visit?" - include this in appointment notes  2. Do you have or have access to (through a family member/friend) a smartphone with video capability that we can use for your visit?" a. If yes - list this number in appt notes as cell (if different from BEST phone #) and list the appointment type as a VIDEO visit in appointment notes b. If no - list the appointment type as a PHONE visit in appointment notes  3. Confirm consent - "In the setting of the current Covid19 crisis, you are scheduled for a (phone or video) visit with your provider on (date) at (time).  Just as we do with many in-office visits, in order for you to participate in this visit, we must obtain consent.  If you'd like, I can send this to your mychart (if signed up) or email for you to review.  Otherwise, I can obtain your verbal consent now.  All virtual visits are billed to your insurance company just like a normal visit would be.  By agreeing to a virtual visit, we'd like you to understand that the technology does not allow for your provider to perform an examination, and thus may limit your provider's ability to fully assess your condition. If your provider identifies any concerns that need to be evaluated in person, we will make arrangements to do so.  Finally, though the technology is pretty good, we cannot assure that it will always work on either your or our end, and in the setting of a video visit, we may have to convert it to a phone-only visit.  In either situation, we cannot ensure that we have a secure connection.  Are you willing to proceed?" STAFF: Did the patient verbally acknowledge consent to telehealth visit? Document  YES/NO here: Yes  4. Advise patient to be prepared - "Two hours prior to your appointment, go ahead and check your blood pressure, pulse, oxygen saturation, and your weight (if you have the equipment to check those) and write them all down. When your visit starts, your provider will ask you for this information. If you have an Apple Watch or Kardia device, please plan to have heart rate information ready on the day of your appointment. Please have a pen and paper handy nearby the day of the visit as well."  5. Give patient instructions for MyChart download to smartphone OR [email or web address] as below if video visit (depending on what platform provider is using)  6. Inform patient they will receive a phone call 15 minutes prior to their appointment time (may be from unknown caller ID) so they should be prepared to answer    TELEPHONE CALL NOTE  [name] has been deemed a candidate for a follow-up tele-health visit to limit community exposure during the Covid-19 pandemic. I spoke with the patient via phone to ensure availability of phone/video source, confirm preferred email & phone number, and discuss instructions and expectations.  I reminded [name] to be prepared with any vital sign and/or heart rhythm information that could potentially be obtained via home monitoring, at the time of her visit. I reminded [name] to expect a phone call prior to  her visit.  [name] 09/18/2018 10:18 AM

## 2018-09-21 ENCOUNTER — Encounter: Payer: Self-pay | Admitting: Cardiovascular Disease

## 2018-09-21 ENCOUNTER — Ambulatory Visit (INDEPENDENT_AMBULATORY_CARE_PROVIDER_SITE_OTHER): Payer: BC Managed Care – PPO

## 2018-09-21 ENCOUNTER — Telehealth (INDEPENDENT_AMBULATORY_CARE_PROVIDER_SITE_OTHER): Payer: BC Managed Care – PPO | Admitting: Cardiovascular Disease

## 2018-09-21 ENCOUNTER — Other Ambulatory Visit: Payer: Self-pay

## 2018-09-21 VITALS — Ht 65.0 in | Wt 360.0 lb

## 2018-09-21 DIAGNOSIS — R079 Chest pain, unspecified: Secondary | ICD-10-CM

## 2018-09-21 DIAGNOSIS — E538 Deficiency of other specified B group vitamins: Secondary | ICD-10-CM

## 2018-09-21 DIAGNOSIS — I4729 Other ventricular tachycardia: Secondary | ICD-10-CM

## 2018-09-21 DIAGNOSIS — I471 Supraventricular tachycardia: Secondary | ICD-10-CM

## 2018-09-21 DIAGNOSIS — I472 Ventricular tachycardia: Secondary | ICD-10-CM

## 2018-09-21 DIAGNOSIS — G4730 Sleep apnea, unspecified: Secondary | ICD-10-CM

## 2018-09-21 DIAGNOSIS — I10 Essential (primary) hypertension: Secondary | ICD-10-CM

## 2018-09-21 DIAGNOSIS — R002 Palpitations: Secondary | ICD-10-CM

## 2018-09-21 DIAGNOSIS — D3502 Benign neoplasm of left adrenal gland: Secondary | ICD-10-CM

## 2018-09-21 MED ORDER — CYANOCOBALAMIN 1000 MCG/ML IJ SOLN
1000.0000 ug | Freq: Once | INTRAMUSCULAR | Status: AC
  Administered 2018-09-21: 1000 ug via INTRAMUSCULAR

## 2018-09-21 NOTE — Progress Notes (Signed)
Virtual Visit via Telephone Note   This visit type was conducted due to national recommendations for restrictions regarding the COVID-19 Pandemic (e.g. social distancing) in an effort to limit this patient's exposure and mitigate transmission in our community.  Due to her co-morbid illnesses, this patient is at least at moderate risk for complications without adequate follow up.  This format is felt to be most appropriate for this patient at this time.  The patient did not have access to video technology/had technical difficulties with video requiring transitioning to audio format only (telephone).  All issues noted in this document were discussed and addressed.  No physical exam could be performed with this format.  Please refer to the patient's chart for her  consent to telehealth for [facility].   Date:  09/21/2018   ID:  [name], DOB [birth year], MRN [id number]  Patient Location: Home Provider Location: Office  PCP:  Binnie Rail, MD  Cardiologist:  Kate Sable, MD  Electrophysiologist:  None   Evaluation Performed:  Follow-Up Visit  Chief Complaint: Chest pain and shortness of breath  History of Present Illness:    [name] is a 48 y.o. female with morbid obesity, severe sleep apnea, hypertension, paroxysmal SVT, chest pain, and anxiety with panic disorder, shortness of breath.  Cardiac CT was normal in March 2020.  Event monitoring demonstrated sinus rhythm with paroxysmal nonsustained ventricular tachycardia with a 6 beat and 10 beat run noted, 1 of which was symptomatic.  Most symptoms corresponded with sinus rhythm.  I initially recommended she try Lopressor 25 mg bid. She tried this but it made her dizzy.  CBC, sodium, potassium, and renal function were normal on 09/12/2018.  She has had HR fluctuations and has episodic mild dizziness. She has exertional dyspnea when walking uphill. She denies chest pain.  Left BP cuff at home.  BP 119/57 on 09/12/18 in  ED. HR 80.  CT abdomen on 09/12/18 showed a hypodense lesion in the left adrenal gland measuring 3.5 cm in greatest dimension. This has enlarged in size from 2.7 cm on the prior exam.  The patient does not have symptoms concerning for COVID-19 infection (fever, chills, cough, or new shortness of breath).    Past Medical History:  Diagnosis Date  . Anxiety   . ASTHMA   . B12 deficiency   . DELAYED GASTRIC EMPTYING   . DEPRESSION   . GERD   . Glucose intolerance (impaired glucose tolerance) 04/09/2015  . Hypertension   . OBESITY   . OBSTRUCTIVE SLEEP APNEA   . POLYCYSTIC OVARIAN DISEASE   . Prediabetes   . SMOKER   . SOB (shortness of breath)   . SVT (supraventricular tachycardia) ([place])   . Tachycardia   . Umbilical hernia   . Umbilical hernia   . Vitamin B12 deficiency 03/2015 dx   start IM replacement q 30d  . Vitamin D deficiency 03/2015 dx   Past Surgical History:  Procedure Laterality Date  . CESAREAN SECTION  97 & 98   x's 2  . Cocxyl removal  1995  . DILATION AND CURETTAGE OF UTERUS    . LAPAROSCOPY    . TUBAL LIGATION  1998     Current Meds  Medication Sig  . ALPRAZolam (XANAX) 0.5 MG tablet Take 1 tablet (0.5 mg total) by mouth 3 times/day as needed-between meals & bedtime for anxiety.  . Cyanocobalamin (B-12 COMPLIANCE INJECTION) 1000 MCG/ML KIT Inject 1,000 mcg as directed every 30 (thirty)  days.  . fluconazole (DIFLUCAN) 150 MG tablet Take one dose, repeat after 3 days if needed  . losartan (COZAAR) 50 MG tablet Take 1 tablet (50 mg total) by mouth daily.  . metFORMIN (GLUCOPHAGE) 500 MG tablet Take 1 tablet (500 mg total) by mouth 2 (two) times daily with a meal.  . metoprolol tartrate (LOPRESSOR) 25 MG tablet Take 12.5 mg by mouth 2 (two) times daily.  . ondansetron (ZOFRAN) 4 MG tablet Take 1 tablet (4 mg total) by mouth every 8 (eight) hours as needed for nausea or vomiting.  . pantoprazole (PROTONIX) 40 MG tablet TAKE 1 TABLET BY MOUTH EVERY DAY  .  PARoxetine (PAXIL) 20 MG tablet TAKE 1 TABLET BY MOUTH EVERY DAY  . UNABLE TO FIND Med Name: CPAP  . valACYclovir (VALTREX) 1000 MG tablet TAKE 2 TABLETS EVERY 12 HOURS FOR 1 DAY START ASAP AFTER SYTPTOM ONSET  . Vitamin D, Ergocalciferol, (DRISDOL) 1.25 MG (50000 UT) CAPS capsule Take 1 capsule (50,000 Units total) by mouth every 7 (seven) days.     Allergies:   Patient has no known allergies.   Social History   Tobacco Use  . Smoking status: Former Smoker    Packs/day: 0.25    Years: 30.00    Pack years: 7.50    Types: Cigarettes    Start date: 10/23/1987    Quit date: 10/23/2017    Years since quitting: 0.9  . Smokeless tobacco: Never Used  Substance Use Topics  . Alcohol use: Yes    Alcohol/week: 0.0 standard drinks    Comment: Rarely  . Drug use: No     Family Hx: The patient's family history includes Anxiety disorder in her mother; Breast cancer in her mother; Depression in her father; Diabetes in her maternal aunt and mother; Heart disease in her paternal grandmother; Heart murmur in her sister; High Cholesterol in her father; High blood pressure in her father and mother; Obesity in her mother; Pancreatic cancer (age of onset: 56) in her paternal grandmother; Prostate cancer (age of onset: 54) in her maternal grandfather; Thyroid disease in her mother. There is no history of Adrenal disorder.  ROS:   Please see the history of present illness.     All other systems reviewed and are negative.   Prior CV studies:   The following studies were reviewed today:  Coronary CT angiogram (06/18/18):  IMPRESSION: 1. Coronary calcium score of 0. This was 0 percentile for age and sex matched control.  2. Normal coronary origin with right dominance.  3. Study quality and ability to assess the distal vessels is limited by body habitus.  4. No evidence of CAD in the proximal to mid coronary arteries.    Labs/Other Tests and Data Reviewed:    EKG:  No ECG reviewed.   Recent Labs: 02/14/2018: TSH 2.480 06/01/2018: Magnesium 1.8 09/12/2018: ALT 15; BUN 12; Creatinine, Ser 0.97; Hemoglobin 12.1; Platelets 386; Potassium 4.2; Sodium 139   Recent Lipid Panel Lab Results  Component Value Date/Time   CHOL 201 (H) 11/24/2017 03:27 PM   TRIG 117.0 11/24/2017 03:27 PM   HDL 45.30 11/24/2017 03:27 PM   CHOLHDL 4 11/24/2017 03:27 PM   LDLCALC 133 (H) 11/24/2017 03:27 PM   LDLDIRECT 154.1 08/16/2012 08:28 AM    Wt Readings from Last 3 Encounters:  09/21/18 (!) 360 lb (163.3 kg)  09/12/18 (!) 360 lb (163.3 kg)  06/08/18 (!) 357 lb (161.9 kg)     Objective:    Vital  Signs:  Ht 5' 5"  (1.651 m)   Wt (!) 360 lb (163.3 kg)   BMI 59.91 kg/m    VITAL SIGNS:  reviewed  ASSESSMENT & PLAN:    1.  Paroxysmal nonsustained ventricular tachycardia: Continue Lopressor 12.5 mg twice daily. She did not tolerate 25 mg bid as it led to dizziness.  2.  Chest pain and shortness of breath: Coronary CT was normal. No further episodes of chest pain. Exertional dyspnea likely due to morbid obesity/deconditioning.  3.  PSVT: No recurrences.  Continue Lopressor 12.5 mg twice daily.  4.  Severe sleep apnea: She is on CPAP.  5.  Hypertension: This will need further monitoring.  No changes to therapy.  6. Left adrenal adenoma: If this is hyperfunctioning, this could be etiology of BP/HR fluctuations. She has been referred to endocrinology by PCP.     COVID-19 Education: The signs and symptoms of COVID-19 were discussed with the patient and how to seek care for testing (follow up with PCP or arrange E-visit).  The importance of social distancing was discussed today.  Time:   Today, I have spent 25 minutes with the patient with telehealth technology discussing the above problems.     Medication Adjustments/Labs and Tests Ordered: Current medicines are reviewed at length with the patient today.  Concerns regarding medicines are outlined above.   Tests Ordered: No orders  of the defined types were placed in this encounter.   Medication Changes: No orders of the defined types were placed in this encounter.   Follow Up:  1 year  Signed, Kate Sable, MD  09/21/2018 9:24 AM    [place]

## 2018-09-21 NOTE — Progress Notes (Signed)
b12 Injection given.   Binnie Rail, MD

## 2018-09-21 NOTE — Patient Instructions (Signed)
Medication Instructions: Your physician recommends that you continue on your current medications as directed. Please refer to the Current Medication list given to you today.   Labwork: none  Procedures/Testing: none  Follow-Up: 1 year with Dr.Koneswaran  Any Additional Special Instructions Will Be Listed Below (If Applicable).     If you need a refill on your cardiac medications before your next appointment, please call your pharmacy.       Thank you for choosing [facility] !

## 2018-09-24 ENCOUNTER — Ambulatory Visit (INDEPENDENT_AMBULATORY_CARE_PROVIDER_SITE_OTHER): Payer: BLUE CROSS/BLUE SHIELD | Admitting: Family Medicine

## 2018-09-24 ENCOUNTER — Encounter (INDEPENDENT_AMBULATORY_CARE_PROVIDER_SITE_OTHER): Payer: Self-pay | Admitting: Family Medicine

## 2018-09-24 ENCOUNTER — Other Ambulatory Visit: Payer: Self-pay

## 2018-09-24 ENCOUNTER — Encounter: Payer: Self-pay | Admitting: Internal Medicine

## 2018-09-24 VITALS — BP 114/52 | HR 74 | Temp 97.6°F | Ht 65.0 in | Wt 370.0 lb

## 2018-09-24 DIAGNOSIS — R7303 Prediabetes: Secondary | ICD-10-CM | POA: Diagnosis not present

## 2018-09-24 DIAGNOSIS — E559 Vitamin D deficiency, unspecified: Secondary | ICD-10-CM

## 2018-09-24 DIAGNOSIS — I10 Essential (primary) hypertension: Secondary | ICD-10-CM | POA: Diagnosis not present

## 2018-09-24 DIAGNOSIS — Z9189 Other specified personal risk factors, not elsewhere classified: Secondary | ICD-10-CM

## 2018-09-24 DIAGNOSIS — K429 Umbilical hernia without obstruction or gangrene: Secondary | ICD-10-CM

## 2018-09-24 DIAGNOSIS — F3289 Other specified depressive episodes: Secondary | ICD-10-CM

## 2018-09-24 DIAGNOSIS — E7849 Other hyperlipidemia: Secondary | ICD-10-CM

## 2018-09-24 MED ORDER — LOSARTAN POTASSIUM 25 MG PO TABS: 25.0000 mg | ORAL_TABLET | Freq: Every day | ORAL | 0 refills | Status: DC

## 2018-09-24 MED ORDER — TOPIRAMATE 25 MG PO TABS: 25.0000 mg | ORAL_TABLET | Freq: Every day | ORAL | 0 refills | Status: DC

## 2018-09-25 ENCOUNTER — Encounter (INDEPENDENT_AMBULATORY_CARE_PROVIDER_SITE_OTHER): Payer: Self-pay | Admitting: Family Medicine

## 2018-09-25 NOTE — Progress Notes (Signed)
Office: [phone]  /  Fax: [phone]   HPI:   Chief Complaint: OBESITY [name] is here to discuss her progress with her obesity treatment plan. She is on the Category 3 plan and is following her eating plan approximately 40% of the time. She states she is exercising 0 minutes 0 times per week. [name] has been off plan since an ED visit on 06/03 for nausea and vomiting. She is worried about her umbilical hernia and an adrenal gland mass that had been noted in the past but has now grown slightly. She has a referral to see Endocrinology for adrenal gland mass. She is drinking sugar sweetened beverages. Her weight is (!) 370 lb (167.8 kg) today and has had a weight gain of 26 lbs since her last in office visit. She has lost 24 lbs since starting treatment with [place].  Vitamin D deficiency [name] has a diagnosis of Vitamin D deficiency, which is not at goal. Her last Vitamin D level was reported to be 46.07 on 11/24/2017. She is currently taking prescription Vit D and reports nausea recently.  Pre-Diabetes [name] has a diagnosis of prediabetes based on her elevated Hgb A1c and was informed this puts her at greater risk of developing diabetes. Her last A1c was 5.7 on 02/14/2018. She is taking metformin currently and continues to work on diet and exercise to decrease risk of diabetes. She denies nausea or hypoglycemia.  At risk for diabetes [name] is at higher than averagerisk for developing diabetes due to her obesity. She currently denies polyuria or polydipsia.  Hyperlipidemia [name] has hyperlipidemia and has been trying to improve her cholesterol levels with intensive lifestyle modification including a low saturated fat diet, exercise and weight loss. She is not currently on a statin. Her last LDL was elevated at 133 on 11/24/2017; triglycerides normal; HDL slightly low at 45. She denies any chest pain or shortness of breath. The 10-year ASCVD risk score [name] DC Brooke Bonito., et al., 2013) is: 4.2%   Values  used to calculate the score:     Age: 78 years     Sex: Female     Is Non-Hispanic African American: No     Diabetic: No     Tobacco smoker: Yes     Systolic Blood Pressure: 800 mmHg     Is BP treated: Yes     HDL Cholesterol: 45.3 mg/dL     Total Cholesterol: 201 mg/dL  Hypertension [name] is a 48 y.o. female with hypertension. She is on losartan 50 mg and reports occasional dizziness which is positional when going from lying or sitting to standing.  [name] denies chest pain or shortness of breath on exertion. She is working weight loss to help control her blood pressure with the goal of decreasing her risk of heart attack and stroke. BP Readings from Last 3 Encounters:  09/24/18 (!) 114/52  09/12/18 104/63  06/18/18 128/71     Depression with emotional eating behaviors [name] is struggling with emotional eating and using food for comfort to the extent that it is negatively impacting her health. She often snacks when she is not hungry. [name] sometimes feels she is out of control and then feels guilty that she made poor food choices. She has been working on behavior modification techniques to help reduce her emotional eating and has been somewhat successful. She reports cravings. No history of kidney stones or glaucoma. She reports being on Topamax 25 mg in the past and it helped  with cravings.. She shows no sign of suicidal or homicidal ideations.  Depression screen [name] 2/9 02/14/2018 05/19/2017 08/19/2015  Decreased Interest 3 0 0  Down, Depressed, Hopeless 2 0 0  PHQ - 2 Score 5 0 0  Altered sleeping 2 - -  Tired, decreased energy 3 - -  Change in appetite 2 - -  Feeling bad or failure about yourself  2 - -  Trouble concentrating 1 - -  Moving slowly or fidgety/restless 2 - -  Suicidal thoughts 1 - -  PHQ-9 Score 18 - -  Difficult doing work/chores Somewhat difficult - -  Some recent data might be hidden   ASSESSMENT AND PLAN:  Vitamin D deficiency - Plan: VITAMIN D 25  Hydroxy (Vit-D Deficiency, Fractures)  Prediabetes - Plan: Hemoglobin A1c, Insulin, random  Other hyperlipidemia - Plan: Lipid Panel With LDL/HDL Ratio  Essential hypertension - Plan: losartan (COZAAR) 25 MG tablet  At risk for diabetes mellitus  Other depression - with emotional eating - Plan: topiramate (TOPAMAX) 25 MG tablet  PLAN:  Vitamin D Deficiency [name] was informed that low Vitamin D levels contributes to fatigue and are associated with obesity, breast, and colon cancer. She will discontinue prescription Vit D until lab check of Vitamin D. She was informed of the risk of over-replacement of Vitamin D and agrees to not increase her dose unless she discusses this with [place] first. [name] agrees to follow-up with our clinic in 2 weeks.  Pre-Diabetes [name] will continue to work on weight loss, exercise, and decreasing simple carbohydrates in her diet to help decrease the risk of diabetes. [name] will have A1c and fasting insulin levels checked and follow-up with [place] as directed to monitor her progress.  Diabetes risk counseling [name] was given extended (15 minutes) diabetes prevention counseling today. She is 48 y.o. female and has risk factors for diabetes including obesity. We discussed intensive lifestyle modifications today with an emphasis on weight loss as well as increasing exercise and decreasing simple carbohydrates in her diet.  Hyperlipidemia [name] was informed of the American Heart Association Guidelines emphasizing intensive lifestyle modifications as the first line treatment for hyperlipidemia. We discussed many lifestyle modifications today in depth, and [name] will continue to work on decreasing saturated fats such as fatty red meat, butter and many fried foods. She will also increase vegetables and lean protein in her diet and continue to work on exercise and weight loss efforts. [name] will have fasting lipid panel checked today.  Hypertension She is likely have orthostatic  hypotension. We will continue to monitor her blood pressure as well as her progress with the above lifestyle modifications. Srihitha will decrease her losartan dose to 25 mg QD #30 with 0 refills and agrees to follow-up with our clinic in 2 weeks. She will watch for signs of hypotension as she continues her lifestyle modifications.  Depression with Emotional Eating Behaviors We discussed behavior modification techniques today to help Maryella deal with her emotional eating and depression. Keondra was given a new prescription for Topamax 25 mg QHS #30 with 0 refills. She agrees to follow-up with our clinic in 2 weeks.   Obesity Lexee is currently in the action stage of change. As such, her goal is to continue with weight loss efforts. She has agreed to follow the Category 3 plan. Lucca will ask her PCP for a referral to General Surgery to see about her umbilical hernia. Zosia has not been prescribed exercise at this time. We discussed the following Behavioral  Modification Strategies today: increasing lean protein intake, decrease liquid calories, emotional eating strategies, and planning for success.  Latika has agreed to follow-up with our clinic in 2 weeks. She was informed of the importance of frequent follow-up visits to maximize her success with intensive lifestyle modifications for her multiple health conditions.  ALLERGIES: No Known Allergies  MEDICATIONS: Current Outpatient Medications on File Prior to Visit  Medication Sig Dispense Refill  . ALPRAZolam (XANAX) 0.5 MG tablet Take 1 tablet (0.5 mg total) by mouth 3 times/day as needed-between meals & bedtime for anxiety. 40 tablet 0  . Cyanocobalamin (B-12 COMPLIANCE INJECTION) 1000 MCG/ML KIT Inject 1,000 mcg as directed every 30 (thirty) days.    . fluconazole (DIFLUCAN) 150 MG tablet Take one dose, repeat after 3 days if needed 2 tablet 0  . metFORMIN (GLUCOPHAGE) 500 MG tablet Take 1 tablet (500 mg total) by mouth 2 (two) times daily with a meal.  180 tablet 3  . metoprolol tartrate (LOPRESSOR) 25 MG tablet Take 12.5 mg by mouth 2 (two) times daily.    . ondansetron (ZOFRAN) 4 MG tablet Take 1 tablet (4 mg total) by mouth every 8 (eight) hours as needed for nausea or vomiting. 12 tablet 0  . pantoprazole (PROTONIX) 40 MG tablet TAKE 1 TABLET BY MOUTH EVERY DAY 90 tablet 3  . PARoxetine (PAXIL) 20 MG tablet TAKE 1 TABLET BY MOUTH EVERY DAY 90 tablet 1  . UNABLE TO FIND Med Name: CPAP    . valACYclovir (VALTREX) 1000 MG tablet TAKE 2 TABLETS EVERY 12 HOURS FOR 1 DAY START ASAP AFTER SYTPTOM ONSET 12 tablet 11   No current facility-administered medications on file prior to visit.     PAST MEDICAL HISTORY: Past Medical History:  Diagnosis Date  . Anxiety   . ASTHMA   . B12 deficiency   . DELAYED GASTRIC EMPTYING   . DEPRESSION   . GERD   . Glucose intolerance (impaired glucose tolerance) 04/09/2015  . Hypertension   . OBESITY   . OBSTRUCTIVE SLEEP APNEA   . POLYCYSTIC OVARIAN DISEASE   . Prediabetes   . SMOKER   . SOB (shortness of breath)   . SVT (supraventricular tachycardia) ([place])   . Tachycardia   . Umbilical hernia   . Umbilical hernia   . Vitamin B12 deficiency 03/2015 dx   start IM replacement q 30d  . Vitamin D deficiency 03/2015 dx    PAST SURGICAL HISTORY: Past Surgical History:  Procedure Laterality Date  . CESAREAN SECTION  97 & 98   x's 2  . Cocxyl removal  1995  . DILATION AND CURETTAGE OF UTERUS    . LAPAROSCOPY    . TUBAL LIGATION  1998    SOCIAL HISTORY: Social History   Tobacco Use  . Smoking status: Former Smoker    Packs/day: 0.25    Years: 30.00    Pack years: 7.50    Types: Cigarettes    Start date: 10/23/1987    Quit date: 10/23/2017    Years since quitting: 0.9  . Smokeless tobacco: Never Used  Substance Use Topics  . Alcohol use: Yes    Alcohol/week: 0.0 standard drinks    Comment: Rarely  . Drug use: No    FAMILY HISTORY: Family History  Problem Relation Age of Onset  .  Breast cancer Mother        dx 11/2009  . Diabetes Mother   . High blood pressure Mother   . Thyroid disease Mother   .  Anxiety disorder Mother   . Obesity Mother   . Diabetes Maternal Aunt   . Prostate cancer Maternal Grandfather 18       also hx colon polyps  . Heart disease Paternal Grandmother   . Pancreatic cancer Paternal Grandmother 69  . High blood pressure Father   . High Cholesterol Father   . Depression Father   . Heart murmur Sister   . Adrenal disorder Neg Hx    ROS: Review of Systems  Respiratory: Negative for shortness of breath.   Cardiovascular: Negative for chest pain.  Gastrointestinal: Positive for nausea.  Neurological: Positive for dizziness (occasional).  Psychiatric/Behavioral: Positive for depression (emotional eating).   PHYSICAL EXAM: Blood pressure (!) 114/52, pulse 74, temperature 97.6 F (36.4 C), temperature source Oral, height _0  (1.651 m), weight (!) 370 lb (167.8 kg), SpO2 97 %. Body mass index is 61.57 kg/m. Physical Exam Vitals signs reviewed.  Constitutional:      Appearance: Normal appearance. She is obese.  Cardiovascular:     Rate and Rhythm: Normal rate.     Pulses: Normal pulses.  Pulmonary:     Effort: Pulmonary effort is normal.     Breath sounds: Normal breath sounds.  Musculoskeletal: Normal range of motion.  Skin:    General: Skin is warm and dry.  Neurological:     Mental Status: She is alert and oriented to person, place, and time.  Psychiatric:        Behavior: Behavior normal.   RECENT LABS AND TESTS: BMET    Component Value Date/Time   NA 139 09/12/2018 0904   NA 139 02/14/2018 1146   K 4.2 09/12/2018 0904   CL 103 09/12/2018 0904   CO2 24 09/12/2018 0904   GLUCOSE 115 (H) 09/12/2018 0904   BUN 12 09/12/2018 0904   BUN 12 02/14/2018 1146   CREATININE 0.97 09/12/2018 0904   CALCIUM 9.0 09/12/2018 0904   GFRNONAA >60 09/12/2018 0904   GFRAA >60 09/12/2018 0904   Lab Results  Component Value Date    HGBA1C 5.7 (H) 02/14/2018   HGBA1C 6.1 11/24/2017   HGBA1C 5.9 05/19/2017   HGBA1C 5.9 05/25/2016   HGBA1C 5.9 10/14/2015   Lab Results  Component Value Date   INSULIN 19.1 02/14/2018   CBC    Component Value Date/Time   WBC 9.8 09/12/2018 0904   RBC 4.14 09/12/2018 0904   HGB 12.1 09/12/2018 0904   HCT 39.0 09/12/2018 0904   PLT 386 09/12/2018 0904   MCV 94.2 09/12/2018 0904   MCH 29.2 09/12/2018 0904   MCHC 31.0 09/12/2018 0904   RDW 14.0 09/12/2018 0904   LYMPHSABS 2.8 06/01/2018 1554   MONOABS 0.5 06/01/2018 1554   EOSABS 0.2 06/01/2018 1554   BASOSABS 0.1 06/01/2018 1554   Iron/TIBC/Ferritin/ %Sat No results found for: IRON, TIBC, FERRITIN, IRONPCTSAT Lipid Panel     Component Value Date/Time   CHOL 201 (H) 11/24/2017 1527   TRIG 117.0 11/24/2017 1527   HDL 45.30 11/24/2017 1527   CHOLHDL 4 11/24/2017 1527   VLDL 23.4 11/24/2017 1527   LDLCALC 133 (H) 11/24/2017 1527   LDLDIRECT 154.1 08/16/2012 0828   Hepatic Function Panel     Component Value Date/Time   PROT 7.8 09/12/2018 0904   PROT 7.2 02/14/2018 1146   ALBUMIN 3.8 09/12/2018 0904   ALBUMIN 4.1 02/14/2018 1146   AST 10 (L) 09/12/2018 0904   ALT 15 09/12/2018 0904   ALKPHOS 83 09/12/2018 0904   BILITOT  0.3 09/12/2018 0904   BILITOT 0.3 02/14/2018 1146   BILIDIR <0.1 05/17/2018 2112   IBILI NOT CALCULATED 05/17/2018 2112      Component Value Date/Time   TSH 2.480 02/14/2018 1146   TSH 2.94 10/27/2017 1353   TSH 1.80 05/19/2017 1011   Results for PRISILLA, KOCSIS (MRN [id number]) as of 09/25/2018 07:53  Ref. Range 11/24/2017 15:27  VITD Latest Ref Range: 30.00 - 100.00 ng/mL 46.07    OBESITY BEHAVIORAL INTERVENTION VISIT  Today's visit was #11   Starting weight: 368 lbs Starting date: 02/14/2018 Today's weight: 370 lbs Today's date: 09/24/2018 Total lbs lost to date: 0  ASK: We discussed the diagnosis of obesity with [name] today and Ruhama agreed to give [place] permission to discuss  obesity behavioral modification therapy today.  ASSESS: [name] has the diagnosis of obesity and her BMI today is 61.6. Mellissa is in the action stage of change.   ADVISE: Caliann was educated on the multiple health risks of obesity as well as the benefit of weight loss to improve her health. She was advised of the need for long term treatment and the importance of lifestyle modifications to improve her current health and to decrease her risk of future health problems.  AGREE: Multiple dietary modification options and treatment options were discussed and  Joaquina agreed to follow the recommendations documented in the above note.  ARRANGE: Vinia was educated on the importance of frequent visits to treat obesity as outlined per CMS and USPSTF guidelines and agreed to schedule her next follow up appointment today.  I, Michaelene Song, am acting as Location manager for [employer], [place].  I have reviewed the above documentation for accuracy and completeness, and I agree with the above.  - Durinda Buzzelli, FNP-C.

## 2018-09-28 ENCOUNTER — Encounter: Payer: Self-pay | Admitting: Internal Medicine

## 2018-09-28 NOTE — Progress Notes (Signed)
error 

## 2018-09-29 LAB — LIPID PANEL WITH LDL/HDL RATIO
Cholesterol, Total: 196 mg/dL (ref 100–199)
HDL: 37 mg/dL — ABNORMAL LOW (ref >39)
LDL Calculated: 129 mg/dL — ABNORMAL HIGH (ref 0–99)
LDl/HDL Ratio: 3.5 ratio — ABNORMAL HIGH (ref 0.0–3.2)
Triglycerides: 148 mg/dL (ref 0–149)
VLDL Cholesterol Cal: 30 mg/dL (ref 5–40)

## 2018-09-29 LAB — HEMOGLOBIN A1C
Est. average glucose Bld gHb Est-mCnc: 120 mg/dL
Hgb A1c MFr Bld: 5.8 % — ABNORMAL HIGH (ref 4.8–5.6)

## 2018-09-29 LAB — VITAMIN D 25 HYDROXY (VIT D DEFICIENCY, FRACTURES): Vit D, 25-Hydroxy: 51.9 ng/mL (ref 30.0–100.0)

## 2018-09-29 LAB — INSULIN, RANDOM: INSULIN: 2 u[IU]/mL — ABNORMAL LOW (ref 2.6–24.9)

## 2018-10-06 ENCOUNTER — Other Ambulatory Visit (INDEPENDENT_AMBULATORY_CARE_PROVIDER_SITE_OTHER): Payer: Self-pay | Admitting: Family Medicine

## 2018-10-06 DIAGNOSIS — E559 Vitamin D deficiency, unspecified: Secondary | ICD-10-CM

## 2018-10-08 ENCOUNTER — Other Ambulatory Visit: Payer: Self-pay

## 2018-10-08 ENCOUNTER — Ambulatory Visit (INDEPENDENT_AMBULATORY_CARE_PROVIDER_SITE_OTHER): Payer: BC Managed Care – PPO | Admitting: Family Medicine

## 2018-10-08 VITALS — BP 120/65 | HR 68 | Temp 98.0°F | Ht 65.0 in | Wt 358.0 lb

## 2018-10-08 DIAGNOSIS — Z9189 Other specified personal risk factors, not elsewhere classified: Secondary | ICD-10-CM | POA: Diagnosis not present

## 2018-10-08 DIAGNOSIS — E66813 Obesity, class 3: Secondary | ICD-10-CM

## 2018-10-08 DIAGNOSIS — Z6841 Body Mass Index (BMI) 40.0 and over, adult: Secondary | ICD-10-CM

## 2018-10-08 DIAGNOSIS — F3289 Other specified depressive episodes: Secondary | ICD-10-CM | POA: Diagnosis not present

## 2018-10-08 DIAGNOSIS — E559 Vitamin D deficiency, unspecified: Secondary | ICD-10-CM | POA: Diagnosis not present

## 2018-10-08 MED ORDER — VITAMIN D (ERGOCALCIFEROL) 1.25 MG (50000 UNIT) PO CAPS: ORAL_CAPSULE | ORAL | 0 refills | Status: DC

## 2018-10-09 ENCOUNTER — Encounter (INDEPENDENT_AMBULATORY_CARE_PROVIDER_SITE_OTHER): Payer: Self-pay | Admitting: Family Medicine

## 2018-10-09 NOTE — Progress Notes (Signed)
Office: [phone]  /  Fax: [phone]   HPI:   Chief Complaint: OBESITY [name] is here to discuss her progress with her obesity treatment plan. She is on the Category 3 plan and is following her eating plan approximately 100% of the time. She states she is exercising 0 minutes 0 times per week. Shyia states she stopped drinking soda and has stuck to the plan very well.  Her weight is (!) 358 lb (162.4 kg) today and has had a weight loss of 12 pounds over a period of 2 weeks since her last visit. She has lost 10 lbs since starting treatment with [place].  Vitamin D deficiency [name] has a diagnosis of Vitamin D deficiency, which is at goal (51.9 on 09/28/2018). She states her Vitamin D is always low without taking prescription Vitamin D. [name] is currently taking prescription Vit D and denies nausea, vomiting or muscle weakness.  At risk for osteopenia and osteoporosis [name] is at higher risk of osteopenia and osteoporosis due to vitamin D deficiency.   Depression with emotional eating behaviors [name] is struggling with emotional eating and using food for comfort to the extent that it is negatively impacting her health. She often snacks when she is not hungry. [name] sometimes feels she is out of control and then feels guilty that she made poor food choices. She has been working on behavior modification techniques to help reduce her emotional eating and has been somewhat successful. [name] reports stress eating a small amount and has cravings at night. She states she has not started Topamax 25 mg. She shows no sign of suicidal or homicidal ideations.  Depression screen [facility] 2/9 02/14/2018 05/19/2017 08/19/2015  Decreased Interest 3 0 0  Down, Depressed, Hopeless 2 0 0  PHQ - 2 Score 5 0 0  Altered sleeping 2 - -  Tired, decreased energy 3 - -  Change in appetite 2 - -  Feeling bad or failure about yourself  2 - -  Trouble concentrating 1 - -  Moving slowly or fidgety/restless 2 - -  Suicidal thoughts  1 - -  PHQ-9 Score 18 - -  Difficult doing work/chores Somewhat difficult - -  Some recent data might be hidden   ASSESSMENT AND PLAN:   ICD-10-CM   1. Vitamin D deficiency  E55.9 Vitamin D, Ergocalciferol, (DRISDOL) 1.25 MG (50000 UT) CAPS capsule  2. At risk for osteoporosis  Z91.89   3. Other depression  F32.89    with emotional eating  4. Class 3 severe obesity with serious comorbidity and body mass index (BMI) of 50.0 to 59.9 in adult, unspecified obesity type ([place])  E66.01    Z68.43    PLAN:  Vitamin D Deficiency [name] was informed that low Vitamin D levels contributes to fatigue and are associated with obesity, breast, and colon cancer. She agrees to continue to take prescription Vit D @ 50,000 IU every other week #6 with 0 refills and will follow-up for routine testing of Vitamin D, at least 2-3 times per year. She was informed of the risk of over-replacement of Vitamin D and agrees to not increase her dose unless she discusses this with [place] first. [name] agrees to follow-up with our clinic in 2 weeks.  At risk for osteopenia and osteoporosis [name] was given extended  (15 minutes) osteoporosis prevention counseling today. [name] is at risk for osteopenia and osteoporsis due to her vitamin D deficiency. She was encouraged to take her vitamin D and follow her higher  calcium diet and increase strengthening exercise to help strengthen her bones and decrease her risk of osteopenia and osteoporosis.   Depression with Emotional Eating Behaviors We discussed behavior modification techniques today to help [name] deal with her emotional eating and depression. [name] was encouraged to start Topamax as prescribed (no prescription needed).   Obesity [name] is currently in the action stage of change. As such, her goal is to continue with weight loss efforts. She has agreed to follow the Category 3 plan (individualized). She was instructed to eat a [name] Delight and yogurt for breakfast, Category 3  plan lunch, and eat a regular meal with lean meat and sides (1 serving) for dinner. [name] has been instructed to walk for 15 minutes 2 days a week for weight loss and overall health benefits. We discussed the following Behavioral Modification Strategies today: planning for success.  [name] has agreed to follow-up with our clinic in 2 weeks. She was informed of the importance of frequent follow-up visits to maximize her success with intensive lifestyle modifications for her multiple health conditions.  ALLERGIES: No Known Allergies  MEDICATIONS: Current Outpatient Medications on File Prior to Visit  Medication Sig Dispense Refill  . ALPRAZolam (XANAX) 0.5 MG tablet Take 1 tablet (0.5 mg total) by mouth 3 times/day as needed-between meals & bedtime for anxiety. 40 tablet 0  . Cyanocobalamin (B-12 COMPLIANCE INJECTION) 1000 MCG/ML KIT Inject 1,000 mcg as directed every 30 (thirty) days.    . fluconazole (DIFLUCAN) 150 MG tablet Take one dose, repeat after 3 days if needed 2 tablet 0  . losartan (COZAAR) 25 MG tablet Take 1 tablet (25 mg total) by mouth daily. 30 tablet 0  . metFORMIN (GLUCOPHAGE) 500 MG tablet Take 1 tablet (500 mg total) by mouth 2 (two) times daily with a meal. 180 tablet 3  . metoprolol tartrate (LOPRESSOR) 25 MG tablet Take 12.5 mg by mouth 2 (two) times daily.    . ondansetron (ZOFRAN) 4 MG tablet Take 1 tablet (4 mg total) by mouth every 8 (eight) hours as needed for nausea or vomiting. 12 tablet 0  . pantoprazole (PROTONIX) 40 MG tablet TAKE 1 TABLET BY MOUTH EVERY DAY 90 tablet 3  . PARoxetine (PAXIL) 20 MG tablet TAKE 1 TABLET BY MOUTH EVERY DAY 90 tablet 1  . topiramate (TOPAMAX) 25 MG tablet Take 1 tablet (25 mg total) by mouth daily. 30 tablet 0  . UNABLE TO FIND Med Name: CPAP    . valACYclovir (VALTREX) 1000 MG tablet TAKE 2 TABLETS EVERY 12 HOURS FOR 1 DAY START ASAP AFTER SYTPTOM ONSET 12 tablet 11   No current facility-administered medications on file prior to  visit.     PAST MEDICAL HISTORY: Past Medical History:  Diagnosis Date  . Anxiety   . ASTHMA   . B12 deficiency   . DELAYED GASTRIC EMPTYING   . DEPRESSION   . GERD   . Glucose intolerance (impaired glucose tolerance) 04/09/2015  . Hypertension   . OBESITY   . OBSTRUCTIVE SLEEP APNEA   . POLYCYSTIC OVARIAN DISEASE   . Prediabetes   . SMOKER   . SOB (shortness of breath)   . SVT (supraventricular tachycardia) ([place])   . Tachycardia   . Umbilical hernia   . Umbilical hernia   . Vitamin B12 deficiency 03/2015 dx   start IM replacement q 30d  . Vitamin D deficiency 03/2015 dx    PAST SURGICAL HISTORY: Past Surgical History:  Procedure Laterality Date  .  CESAREAN SECTION  97 & 98   x's 2  . Cocxyl removal  1995  . DILATION AND CURETTAGE OF UTERUS    . LAPAROSCOPY    . TUBAL LIGATION  1998    SOCIAL HISTORY: Social History   Tobacco Use  . Smoking status: Former Smoker    Packs/day: 0.25    Years: 30.00    Pack years: 7.50    Types: Cigarettes    Start date: 10/23/1987    Quit date: 10/23/2017    Years since quitting: 0.9  . Smokeless tobacco: Never Used  Substance Use Topics  . Alcohol use: Yes    Alcohol/week: 0.0 standard drinks    Comment: Rarely  . Drug use: No    FAMILY HISTORY: Family History  Problem Relation Age of Onset  . Breast cancer Mother        dx 11/2009  . Diabetes Mother   . High blood pressure Mother   . Thyroid disease Mother   . Anxiety disorder Mother   . Obesity Mother   . Diabetes Maternal Aunt   . Prostate cancer Maternal Grandfather 81       also hx colon polyps  . Heart disease Paternal Grandmother   . Pancreatic cancer Paternal Grandmother 52  . High blood pressure Father   . High Cholesterol Father   . Depression Father   . Heart murmur Sister   . Adrenal disorder Neg Hx    ROS: Review of Systems  Gastrointestinal: Negative for nausea and vomiting.  Musculoskeletal:       Negative for muscle weakness.   Psychiatric/Behavioral: Positive for depression (emotional eating). Negative for suicidal ideas.       Negative for homicidal ideas.   PHYSICAL EXAM: Blood pressure 120/65, pulse 68, temperature 98 F (36.7 C), temperature source Oral, height _0  (1.651 m), weight (!) 358 lb (162.4 kg), SpO2 97 %. Body mass index is 59.57 kg/m. Physical Exam Vitals signs reviewed.  Constitutional:      Appearance: Normal appearance. She is obese.  Cardiovascular:     Rate and Rhythm: Normal rate.     Pulses: Normal pulses.  Pulmonary:     Effort: Pulmonary effort is normal.     Breath sounds: Normal breath sounds.  Musculoskeletal: Normal range of motion.  Skin:    General: Skin is warm and dry.  Neurological:     Mental Status: She is alert and oriented to person, place, and time.  Psychiatric:        Behavior: Behavior normal.   RECENT LABS AND TESTS: BMET    Component Value Date/Time   NA 139 09/12/2018 0904   NA 139 02/14/2018 1146   K 4.2 09/12/2018 0904   CL 103 09/12/2018 0904   CO2 24 09/12/2018 0904   GLUCOSE 115 (H) 09/12/2018 0904   BUN 12 09/12/2018 0904   BUN 12 02/14/2018 1146   CREATININE 0.97 09/12/2018 0904   CALCIUM 9.0 09/12/2018 0904   GFRNONAA >60 09/12/2018 0904   GFRAA >60 09/12/2018 0904   Lab Results  Component Value Date   HGBA1C 5.8 (H) 09/28/2018   HGBA1C 5.7 (H) 02/14/2018   HGBA1C 6.1 11/24/2017   HGBA1C 5.9 05/19/2017   HGBA1C 5.9 05/25/2016   Lab Results  Component Value Date   INSULIN 2.0 (L) 09/28/2018   INSULIN 19.1 02/14/2018   CBC    Component Value Date/Time   WBC 9.8 09/12/2018 0904   RBC 4.14 09/12/2018 0904   HGB  12.1 09/12/2018 0904   HCT 39.0 09/12/2018 0904   PLT 386 09/12/2018 0904   MCV 94.2 09/12/2018 0904   MCH 29.2 09/12/2018 0904   MCHC 31.0 09/12/2018 0904   RDW 14.0 09/12/2018 0904   LYMPHSABS 2.8 06/01/2018 1554   MONOABS 0.5 06/01/2018 1554   EOSABS 0.2 06/01/2018 1554   BASOSABS 0.1 06/01/2018 1554    Iron/TIBC/Ferritin/ %Sat No results found for: IRON, TIBC, FERRITIN, IRONPCTSAT Lipid Panel     Component Value Date/Time   CHOL 196 09/28/2018 0931   TRIG 148 09/28/2018 0931   HDL 37 (L) 09/28/2018 0931   CHOLHDL 4 11/24/2017 1527   VLDL 23.4 11/24/2017 1527   LDLCALC 129 (H) 09/28/2018 0931   LDLDIRECT 154.1 08/16/2012 0828   Hepatic Function Panel     Component Value Date/Time   PROT 7.8 09/12/2018 0904   PROT 7.2 02/14/2018 1146   ALBUMIN 3.8 09/12/2018 0904   ALBUMIN 4.1 02/14/2018 1146   AST 10 (L) 09/12/2018 0904   ALT 15 09/12/2018 0904   ALKPHOS 83 09/12/2018 0904   BILITOT 0.3 09/12/2018 0904   BILITOT 0.3 02/14/2018 1146   BILIDIR <0.1 05/17/2018 2112   IBILI NOT CALCULATED 05/17/2018 2112      Component Value Date/Time   TSH 2.480 02/14/2018 1146   TSH 2.94 10/27/2017 1353   TSH 1.80 05/19/2017 1011   Results for [name], [name] (MRN [id number]) as of 10/09/2018 08:39  Ref. Range 09/28/2018 09:31  Vitamin D, 25-Hydroxy Latest Ref Range: 30.0 - 100.0 ng/mL 51.9   OBESITY BEHAVIORAL INTERVENTION VISIT  Today's visit was #12  Starting weight: 368 lbs Starting date: 02/14/2018 Today's weight: 358 lbs Today's date: 10/08/2018 Total lbs lost to date: 10    10/08/2018  Height _0  (1.651 m)  Weight 358 lb (162.4 kg) (A)  BMI (Calculated) 59.57  BLOOD PRESSURE - SYSTOLIC 211  BLOOD PRESSURE - DIASTOLIC 65   Body Fat % 15.5 %   ASK: We discussed the diagnosis of obesity with [name] today and [name] agreed to give [place] permission to discuss obesity behavioral modification therapy today.  ASSESS: [name] has the diagnosis of obesity and her BMI today is 59.6. [name] is in the action stage of change.   ADVISE: [name] was educated on the multiple health risks of obesity as well as the benefit of weight loss to improve her health. She was advised of the need for long term treatment and the importance of lifestyle modifications to improve her current health and to  decrease her risk of future health problems.  AGREE: Multiple dietary modification options and treatment options were discussed and  [place] agreed to follow the recommendations documented in the above note.  ARRANGE: [name] was educated on the importance of frequent visits to treat obesity as outlined per CMS and USPSTF guidelines and agreed to schedule her next follow up appointment today.  IMichaelene Song, am acting as Location manager for [employer], FNP  I have reviewed the above documentation for accuracy and completeness, and I agree with the above.  - Keyle Doby, FNP-C.

## 2018-10-10 DIAGNOSIS — D3502 Benign neoplasm of left adrenal gland: Secondary | ICD-10-CM | POA: Insufficient documentation

## 2018-10-19 ENCOUNTER — Ambulatory Visit: Payer: BC Managed Care – PPO

## 2018-10-19 ENCOUNTER — Ambulatory Visit (INDEPENDENT_AMBULATORY_CARE_PROVIDER_SITE_OTHER): Payer: BC Managed Care – PPO

## 2018-10-19 ENCOUNTER — Other Ambulatory Visit: Payer: Self-pay

## 2018-10-19 DIAGNOSIS — E538 Deficiency of other specified B group vitamins: Secondary | ICD-10-CM

## 2018-10-19 MED ORDER — CYANOCOBALAMIN 1000 MCG/ML IJ SOLN
1000.0000 ug | Freq: Once | INTRAMUSCULAR | Status: AC
  Administered 2018-10-19: 1000 ug via INTRAMUSCULAR

## 2018-10-19 NOTE — Progress Notes (Signed)
b12 Injection given.   Binnie Rail, MD

## 2018-10-22 ENCOUNTER — Ambulatory Visit (INDEPENDENT_AMBULATORY_CARE_PROVIDER_SITE_OTHER): Payer: BC Managed Care – PPO | Admitting: Family Medicine

## 2018-10-22 ENCOUNTER — Other Ambulatory Visit: Payer: Self-pay

## 2018-10-22 ENCOUNTER — Encounter (INDEPENDENT_AMBULATORY_CARE_PROVIDER_SITE_OTHER): Payer: Self-pay | Admitting: Family Medicine

## 2018-10-22 VITALS — BP 140/73 | HR 87 | Temp 97.9°F | Ht 65.0 in | Wt 360.0 lb

## 2018-10-22 DIAGNOSIS — R7303 Prediabetes: Secondary | ICD-10-CM

## 2018-10-22 DIAGNOSIS — Z9189 Other specified personal risk factors, not elsewhere classified: Secondary | ICD-10-CM | POA: Diagnosis not present

## 2018-10-22 DIAGNOSIS — I10 Essential (primary) hypertension: Secondary | ICD-10-CM | POA: Diagnosis not present

## 2018-10-22 DIAGNOSIS — Z6841 Body Mass Index (BMI) 40.0 and over, adult: Secondary | ICD-10-CM

## 2018-10-22 MED ORDER — LOSARTAN POTASSIUM 25 MG PO TABS: 25.0000 mg | ORAL_TABLET | Freq: Every day | ORAL | 0 refills | Status: DC

## 2018-10-23 NOTE — Progress Notes (Signed)
Office: [phone]  /  Fax: [phone]   HPI:   Chief Complaint: OBESITY [name] is here to discuss her progress with her obesity treatment plan. She is on the Category 3 plan (individualized) and is following her eating plan approximately 95 % of the time. She states she is walking for 15 minutes 3 times per week. [name] has a significant amount of health stresses going on with adrenal tumor increased in size of ventral hernia. She has not started Topamax secondary to health concern.  Her weight is (!) 360 lb (163.3 kg) today and has gained 2 lbs since her last visit. She has lost 8 lbs since starting treatment with [place].  Hypertension [name] is a 48 y.o. female with hypertension. [name]'s blood pressure is slightly elevated today, but previously controlled. She denies chest pain, chest pressure, or headaches. She is working on weight loss to help control her blood pressure with the goal of decreasing her risk of heart attack and stroke.   At risk for cardiovascular disease [name] is at a higher than average risk for cardiovascular disease due to obesity and hypertension. She currently denies any chest pain.  Pre-Diabetes [name] has a diagnosis of pre-diabetes based on her elevated Hgb A1c and was informed this puts her at greater risk of developing diabetes. She is on metformin 500 mg PO BID. She notes no change in cravings or sweet control. She continues to work on diet and exercise to decrease risk of diabetes. She denies nausea or hypoglycemia.  ASSESSMENT AND PLAN:  Prediabetes  Essential hypertension - Plan: losartan (COZAAR) 25 MG tablet  At risk for heart disease  Class 3 severe obesity with serious comorbidity and body mass index (BMI) of 50.0 to 59.9 in adult, unspecified obesity type ([place])  PLAN:  Hypertension We discussed sodium restriction, working on healthy weight loss, and a regular exercise program as the means to achieve improved blood pressure control. [name] agreed  with this plan and agreed to follow up as directed. We will continue to monitor her blood pressure as well as her progress with the above lifestyle modifications. [name] agrees to continue taking Losartan 25 mg PO daily #30 and we will refill for 1 month. She will watch for signs of hypotension as she continues her lifestyle modifications. [name] agrees to follow up with our clinic in 2 weeks.  Cardiovascular risk counseling [name] was given extended (15 minutes) coronary artery disease prevention counseling today. She is 48 y.o. female and has risk factors for heart disease including obesity and hypertension. We discussed intensive lifestyle modifications today with an emphasis on specific weight loss instructions and strategies. Pt was also informed of the importance of increasing exercise and decreasing saturated fats to help prevent heart disease.  Pre-Diabetes [name] will continue to work on weight loss, exercise, and decreasing simple carbohydrates in her diet to help decrease the risk of diabetes. We dicussed metformin including benefits and risks. She was informed that eating too many simple carbohydrates or too many calories at one sitting increases the likelihood of GI side effects. [name] agrees to increase metformin to 500 mg PO q AM and 1,000 mg PO q PM. [name] agrees to follow up with our clinic in 2 weeks as directed to monitor her progress.  Obesity [name] is currently in the action stage of change. As such, her goal is to continue with weight loss efforts She has agreed to follow the Category 3 plan [name] has been instructed to work up  to a goal of 150 minutes of combined cardio and strengthening exercise per week for weight loss and overall health benefits. We discussed the following Behavioral Modification Strategies today: increasing lean protein intake, increasing vegetables and work on meal planning and easy cooking plans, keeping healthy foods in the home, better snacking choices, and planning  for success   [name] has agreed to follow up with our clinic in 2 weeks. She was informed of the importance of frequent follow up visits to maximize her success with intensive lifestyle modifications for her multiple health conditions.  ALLERGIES: No Known Allergies  MEDICATIONS: Current Outpatient Medications on File Prior to Visit  Medication Sig Dispense Refill  . ALPRAZolam (XANAX) 0.5 MG tablet Take 1 tablet (0.5 mg total) by mouth 3 times/day as needed-between meals & bedtime for anxiety. 40 tablet 0  . Cyanocobalamin (B-12 COMPLIANCE INJECTION) 1000 MCG/ML KIT Inject 1,000 mcg as directed every 30 (thirty) days.    . fluconazole (DIFLUCAN) 150 MG tablet Take one dose, repeat after 3 days if needed 2 tablet 0  . metFORMIN (GLUCOPHAGE) 500 MG tablet Take 1 tablet (500 mg total) by mouth 2 (two) times daily with a meal. 180 tablet 3  . metoprolol tartrate (LOPRESSOR) 25 MG tablet Take 12.5 mg by mouth 2 (two) times daily.    . ondansetron (ZOFRAN) 4 MG tablet Take 1 tablet (4 mg total) by mouth every 8 (eight) hours as needed for nausea or vomiting. 12 tablet 0  . pantoprazole (PROTONIX) 40 MG tablet TAKE 1 TABLET BY MOUTH EVERY DAY 90 tablet 3  . PARoxetine (PAXIL) 20 MG tablet TAKE 1 TABLET BY MOUTH EVERY DAY 90 tablet 1  . UNABLE TO FIND Med Name: CPAP    . valACYclovir (VALTREX) 1000 MG tablet TAKE 2 TABLETS EVERY 12 HOURS FOR 1 DAY START ASAP AFTER SYTPTOM ONSET 12 tablet 11  . Vitamin D, Ergocalciferol, (DRISDOL) 1.25 MG (50000 UT) CAPS capsule Take 1 capsule by mouth every other week. 6 capsule 0  . topiramate (TOPAMAX) 25 MG tablet Take 1 tablet (25 mg total) by mouth daily. (Patient not taking: Reported on 10/22/2018) 30 tablet 0   No current facility-administered medications on file prior to visit.     PAST MEDICAL HISTORY: Past Medical History:  Diagnosis Date  . Anxiety   . ASTHMA   . B12 deficiency   . DELAYED GASTRIC EMPTYING   . DEPRESSION   . GERD   . Glucose  intolerance (impaired glucose tolerance) 04/09/2015  . Hypertension   . OBESITY   . OBSTRUCTIVE SLEEP APNEA   . POLYCYSTIC OVARIAN DISEASE   . Prediabetes   . SMOKER   . SOB (shortness of breath)   . SVT (supraventricular tachycardia) ([place])   . Tachycardia   . Umbilical hernia   . Umbilical hernia   . Vitamin B12 deficiency 03/2015 dx   start IM replacement q 30d  . Vitamin D deficiency 03/2015 dx    PAST SURGICAL HISTORY: Past Surgical History:  Procedure Laterality Date  . CESAREAN SECTION  97 & 98   x's 2  . Cocxyl removal  1995  . DILATION AND CURETTAGE OF UTERUS    . LAPAROSCOPY    . TUBAL LIGATION  1998    SOCIAL HISTORY: Social History   Tobacco Use  . Smoking status: Former Smoker    Packs/day: 0.25    Years: 30.00    Pack years: 7.50    Types: Cigarettes    Start  date: 10/23/1987    Quit date: 10/23/2017    Years since quitting: 1.0  . Smokeless tobacco: Never Used  Substance Use Topics  . Alcohol use: Yes    Alcohol/week: 0.0 standard drinks    Comment: Rarely  . Drug use: No    FAMILY HISTORY: Family History  Problem Relation Age of Onset  . Breast cancer Mother        dx 11/2009  . Diabetes Mother   . High blood pressure Mother   . Thyroid disease Mother   . Anxiety disorder Mother   . Obesity Mother   . Diabetes Maternal Aunt   . Prostate cancer Maternal Grandfather 10       also hx colon polyps  . Heart disease Paternal Grandmother   . Pancreatic cancer Paternal Grandmother 30  . High blood pressure Father   . High Cholesterol Father   . Depression Father   . Heart murmur Sister   . Adrenal disorder Neg Hx     ROS: Review of Systems  Constitutional: Negative for weight loss.  Cardiovascular: Negative for chest pain.       Negative chest pressure  Gastrointestinal: Negative for nausea.  Neurological: Negative for headaches.  Endo/Heme/Allergies:       Negative hypoglycemia    PHYSICAL EXAM: Blood pressure 140/73, pulse 87,  temperature 97.9 F (36.6 C), temperature source Oral, height 5' 5"  (1.651 m), weight (!) 360 lb (163.3 kg), SpO2 93 %. Body mass index is 59.91 kg/m. Physical Exam Vitals signs reviewed.  Constitutional:      Appearance: Normal appearance. She is obese.  Cardiovascular:     Rate and Rhythm: Normal rate.     Pulses: Normal pulses.  Pulmonary:     Effort: Pulmonary effort is normal.     Breath sounds: Normal breath sounds.  Musculoskeletal: Normal range of motion.  Skin:    General: Skin is warm and dry.  Neurological:     Mental Status: She is alert and oriented to person, place, and time.  Psychiatric:        Mood and Affect: Mood normal.        Behavior: Behavior normal.     RECENT LABS AND TESTS: BMET    Component Value Date/Time   NA 139 09/12/2018 0904   NA 139 02/14/2018 1146   K 4.2 09/12/2018 0904   CL 103 09/12/2018 0904   CO2 24 09/12/2018 0904   GLUCOSE 115 (H) 09/12/2018 0904   BUN 12 09/12/2018 0904   BUN 12 02/14/2018 1146   CREATININE 0.97 09/12/2018 0904   CALCIUM 9.0 09/12/2018 0904   GFRNONAA >60 09/12/2018 0904   GFRAA >60 09/12/2018 0904   Lab Results  Component Value Date   HGBA1C 5.8 (H) 09/28/2018   HGBA1C 5.7 (H) 02/14/2018   HGBA1C 6.1 11/24/2017   HGBA1C 5.9 05/19/2017   HGBA1C 5.9 05/25/2016   Lab Results  Component Value Date   INSULIN 2.0 (L) 09/28/2018   INSULIN 19.1 02/14/2018   CBC    Component Value Date/Time   WBC 9.8 09/12/2018 0904   RBC 4.14 09/12/2018 0904   HGB 12.1 09/12/2018 0904   HCT 39.0 09/12/2018 0904   PLT 386 09/12/2018 0904   MCV 94.2 09/12/2018 0904   MCH 29.2 09/12/2018 0904   MCHC 31.0 09/12/2018 0904   RDW 14.0 09/12/2018 0904   LYMPHSABS 2.8 06/01/2018 1554   MONOABS 0.5 06/01/2018 1554   EOSABS 0.2 06/01/2018 1554   BASOSABS 0.1  06/01/2018 1554   Iron/TIBC/Ferritin/ %Sat No results found for: IRON, TIBC, FERRITIN, IRONPCTSAT Lipid Panel     Component Value Date/Time   CHOL 196  09/28/2018 0931   TRIG 148 09/28/2018 0931   HDL 37 (L) 09/28/2018 0931   CHOLHDL 4 11/24/2017 1527   VLDL 23.4 11/24/2017 1527   LDLCALC 129 (H) 09/28/2018 0931   LDLDIRECT 154.1 08/16/2012 0828   Hepatic Function Panel     Component Value Date/Time   PROT 7.8 09/12/2018 0904   PROT 7.2 02/14/2018 1146   ALBUMIN 3.8 09/12/2018 0904   ALBUMIN 4.1 02/14/2018 1146   AST 10 (L) 09/12/2018 0904   ALT 15 09/12/2018 0904   ALKPHOS 83 09/12/2018 0904   BILITOT 0.3 09/12/2018 0904   BILITOT 0.3 02/14/2018 1146   BILIDIR <0.1 05/17/2018 2112   IBILI NOT CALCULATED 05/17/2018 2112      Component Value Date/Time   TSH 2.480 02/14/2018 1146   TSH 2.94 10/27/2017 1353   TSH 1.80 05/19/2017 1011      OBESITY BEHAVIORAL INTERVENTION VISIT  Today's visit was # 13   Starting weight: 368 lbs Starting date: 02/14/18 Today's weight : 360 lbs  Today's date: 10/22/2018 Total lbs lost to date: 8    ASK: We discussed the diagnosis of obesity with [name] today and [name] agreed to give [place] permission to discuss obesity behavioral modification therapy today.  ASSESS: Jissell has the diagnosis of obesity and her BMI today is 59.91 Melyna is in the action stage of change   ADVISE: Natayah was educated on the multiple health risks of obesity as well as the benefit of weight loss to improve her health. She was advised of the need for long term treatment and the importance of lifestyle modifications to improve her current health and to decrease her risk of future health problems.  AGREE: Multiple dietary modification options and treatment options were discussed and  Kali agreed to follow the recommendations documented in the above note.  ARRANGE: Tausha was educated on the importance of frequent visits to treat obesity as outlined per CMS and USPSTF guidelines and agreed to schedule her next follow up appointment today.  I, Trixie Dredge, am acting as transcriptionist for Ilene Qua, MD   I have reviewed the above documentation for accuracy and completeness, and I agree with the above. - Ilene Qua, MD

## 2018-10-26 ENCOUNTER — Other Ambulatory Visit: Payer: Self-pay | Admitting: Internal Medicine

## 2018-11-02 ENCOUNTER — Other Ambulatory Visit: Payer: Self-pay | Admitting: *Deleted

## 2018-11-02 MED ORDER — METOPROLOL TARTRATE 25 MG PO TABS: 12.5000 mg | ORAL_TABLET | Freq: Two times a day (BID) | ORAL | 3 refills | Status: DC

## 2018-11-09 ENCOUNTER — Ambulatory Visit: Payer: BLUE CROSS/BLUE SHIELD | Admitting: Pulmonary Disease

## 2018-11-09 ENCOUNTER — Other Ambulatory Visit: Payer: Self-pay

## 2018-11-09 ENCOUNTER — Encounter: Payer: Self-pay | Admitting: Pulmonary Disease

## 2018-11-09 VITALS — BP 138/88 | HR 82 | Temp 98.2°F | Ht 65.0 in | Wt 361.8 lb

## 2018-11-09 DIAGNOSIS — G4733 Obstructive sleep apnea (adult) (pediatric): Secondary | ICD-10-CM

## 2018-11-09 DIAGNOSIS — G4730 Sleep apnea, unspecified: Secondary | ICD-10-CM | POA: Diagnosis not present

## 2018-11-09 DIAGNOSIS — G4710 Hypersomnia, unspecified: Secondary | ICD-10-CM

## 2018-11-09 DIAGNOSIS — E669 Obesity, unspecified: Secondary | ICD-10-CM | POA: Diagnosis not present

## 2018-11-09 NOTE — Patient Instructions (Signed)
Will have you set up with new home care company for CPAP supplies  Will arrange for CPAP mask refitting  Will have your auto CPAP settings changed to 9 - 18 cm water  Will arrange for overnight oxygen test with you using CPAP  Follow up in 6 months

## 2018-11-09 NOTE — Progress Notes (Signed)
[facility]  Chief Complaint  Patient presents with  . Follow-up    F/U re: Cpap. She is wating to switch dme companies. Currently with [employer]. She reports she thinks her pressure may be too low.     Constitutional:  BP 138/88   Pulse 82   Temp 98.2 F (36.8 C) (Oral)   Ht 5' 5"  (1.651 m)   Wt (!) 361 lb 12.8 oz (164.1 kg)   SpO2 99%   BMI 60.21 kg/m   Past Medical History:  Vit D deficiency, Vit B12 deficiency, SVT, PCOS, HTN, GERD, Depression, Asthma, Anxiety, Adrenal adenoma  Brief Summary:  [name] is a 48 y.o. female  with obstructive sleep apnea.  She was enrolled in weight loss clinic and lost 20 lbs.  Was feeling better.  Wasn't able to attend due to pandemic and regained weight.  Has re-enrolled, and lost 6 lbs.  Still gets short of breath with activity.  Not having cough, wheeze, sputum, chest pain, or swelling.  Had cardiac CT in March.  Showed a nodule.  Uses CPAP nightly.  Feels like pressure isn't high enough.  Still gets sleepy during the day.  Physical Exam:   Appearance - well kempt   ENMT - clear nasal mucosa, midline nasal  septum, no oral exudates, no LAN, trachea midline  Respiratory - normal chest wall, normal respiratory effort, no accessory muscle use, no wheeze/rales  CV - s1s2 regular rate and rhythm, no murmurs, no peripheral edema, radial pulses symmetric  GI - soft, non tender, no masses  Lymph - no adenopathy noted in neck and axillary areas  MSK - normal gait  Ext - no cyanosis, clubbing, or joint inflammation noted  Skin - no rashes, lesions, or ulcers  Neuro - normal strength, oriented x 3  Psych - normal mood and affect   Assessment/Plan:   Obstructive sleep apnea. - she is compliant with CPAP and reports benefit - will change her DME - change auto CPAP to 9 - 18 cm H2O - refit her mask - ONO with CPAP and then determine if she needs in lab study  Asthma. - prn albuterol   Obesity. - she has re-enrolled with [facility] - explained much of her breathing issues are related to her weight  Lung nodule. - noted on cardiac CT from March 2020 - she has history of smoking - f/u CT chest w/o contrast in March 2021   Patient Instructions  Will have you set up with new home care company for CPAP supplies  Will arrange for CPAP mask refitting  Will have your auto CPAP settings changed to 9 - 18 cm water  Will arrange for overnight oxygen test with you using CPAP  Follow up in 6 months    Chesley Mires, MD [place] Pager: [phone] 11/09/2018, 11:57 AM  Flow Sheet   Pulmonary tests:  PFT 12/05/17 >> FEV1 2.19 (71%), FEV1% 84, TLC 4.31 (81%), DLCO 75%, + BD  Chest imaging:  Cardiac CT 06/18/18 >> 3 mm nodule LUL  Sleep tests:  HST 06/23/16 >> AHI 99.9, SaO2 low 76%. Auto CPAP 10/09/18 to 11/07/18 >> used on 29 of 30 nights with average 8 hrs 40 min.  Average AHI 0.3 with median CPAP 11 and 95 th percentile CPAP 14 cm H2O  Cardiac tests:  Echo 11/07/13 >> EF 55 to 65%  Medications:   Allergies as of 11/09/2018   No Known  Allergies     Medication List       Accurate as of November 09, 2018 11:57 AM. If you have any questions, ask your nurse or doctor.        STOP taking these medications   ondansetron 4 MG tablet Commonly known as: ZOFRAN Stopped by: Chesley Mires, MD   topiramate 25 MG tablet Commonly known as: Topamax Stopped by: Chesley Mires, MD     TAKE these medications   ALPRAZolam 0.5 MG tablet Commonly known as: Xanax Take 1 tablet (0.5 mg total) by mouth 3 times/day as needed-between meals & bedtime for anxiety.   B-12 Compliance Injection 1000 MCG/ML Kit Generic drug: Cyanocobalamin Inject 1,000 mcg as directed every 30 (thirty) days.   fluconazole 150 MG tablet Commonly known as: DIFLUCAN Take one dose, repeat after 3 days if needed   losartan 25 MG tablet Commonly known as: Cozaar  Take 1 tablet (25 mg total) by mouth daily.   metFORMIN 500 MG tablet Commonly known as: GLUCOPHAGE Take 1 tablet (500 mg total) by mouth 2 (two) times daily with a meal.   metoprolol tartrate 25 MG tablet Commonly known as: LOPRESSOR Take 0.5 tablets (12.5 mg total) by mouth 2 (two) times daily.   pantoprazole 40 MG tablet Commonly known as: PROTONIX TAKE 1 TABLET BY MOUTH EVERY DAY   PARoxetine 20 MG tablet Commonly known as: PAXIL Take 1 tablet (20 mg total) by mouth daily.   UNABLE TO FIND Med Name: CPAP   valACYclovir 1000 MG tablet Commonly known as: VALTREX TAKE 2 TABLETS EVERY 12 HOURS FOR 1 DAY START ASAP AFTER SYTPTOM ONSET   Vitamin D (Ergocalciferol) 1.25 MG (50000 UT) Caps capsule Commonly known as: DRISDOL Take 1 capsule by mouth every other week.       Past Surgical History:  She  has a past surgical history that includes Cocxyl removal (1995); Dilation and curettage of uterus; Tubal ligation (1998); Cesarean section (97 & 98); and laparoscopy.  Family History:  Her family history includes Anxiety disorder in her mother; Breast cancer in her mother; Depression in her father; Diabetes in her maternal aunt and mother; Heart disease in her paternal grandmother; Heart murmur in her sister; High Cholesterol in her father; High blood pressure in her father and mother; Obesity in her mother; Pancreatic cancer (age of onset: 52) in her paternal grandmother; Prostate cancer (age of onset: 44) in her maternal grandfather; Thyroid disease in her mother.  Social History:  She  reports that she quit smoking about 12 months ago. Her smoking use included cigarettes. She started smoking about 31 years ago. She has a 7.50 pack-year smoking history. She has never used smokeless tobacco. She reports current alcohol use. She reports that she does not use drugs.

## 2018-11-12 ENCOUNTER — Ambulatory Visit (INDEPENDENT_AMBULATORY_CARE_PROVIDER_SITE_OTHER): Payer: BC Managed Care – PPO | Admitting: Family Medicine

## 2018-11-12 ENCOUNTER — Encounter (INDEPENDENT_AMBULATORY_CARE_PROVIDER_SITE_OTHER): Payer: Self-pay | Admitting: Family Medicine

## 2018-11-12 ENCOUNTER — Other Ambulatory Visit: Payer: Self-pay

## 2018-11-12 VITALS — BP 116/73 | HR 74 | Temp 98.3°F | Ht 65.0 in | Wt 355.0 lb

## 2018-11-12 DIAGNOSIS — I10 Essential (primary) hypertension: Secondary | ICD-10-CM

## 2018-11-12 DIAGNOSIS — Z9189 Other specified personal risk factors, not elsewhere classified: Secondary | ICD-10-CM

## 2018-11-12 DIAGNOSIS — R7303 Prediabetes: Secondary | ICD-10-CM | POA: Diagnosis not present

## 2018-11-12 DIAGNOSIS — Z6841 Body Mass Index (BMI) 40.0 and over, adult: Secondary | ICD-10-CM

## 2018-11-12 MED ORDER — LOSARTAN POTASSIUM 25 MG PO TABS: 25.0000 mg | ORAL_TABLET | Freq: Every day | ORAL | 0 refills | Status: DC

## 2018-11-12 NOTE — Progress Notes (Signed)
° °Office: [phone]  /  Fax: [phone] ° ° °HPI:  ° °Chief Complaint: OBESITY °[name] is here to discuss her progress with her obesity treatment plan. She is on the Category 3 plan and is following her eating plan approximately 65 % of the time. She states she is exercising 0 minutes 0 times per week. °January did go to the beach over the past few weeks, and has practice some self care and is much better at pulling herself back on track if she indulges. She has voiced that she has felt less guilty. °Her weight is (!) 355 lb (161 kg) today and has had a weight loss of 5 pounds over a period of 3 weeks since her last visit. She has lost 13 lbs since starting treatment with us. ° °Hypertension °[name] is a 47 y.o. female with hypertension. [name]'s blood pressure is controlled today. She denies chest pain, chest pressure, or headaches. She is working on weight loss to help control her blood pressure with the goal of decreasing her risk of heart attack and stroke.  ° °At risk for cardiovascular disease °[name] is at a higher than average risk for cardiovascular disease due to obesity and hypertension. She currently denies any chest pain. ° °Pre-Diabetes °[name] has a diagnosis of pre-diabetes based on her elevated Hgb A1c and was informed this puts her at greater risk of developing diabetes. She has not increased metformin secondary to concern over being at the beach. She continues to work on diet and exercise to decrease risk of diabetes. She denies nausea or hypoglycemia. ° °ASSESSMENT AND PLAN: ° °Prediabetes ° °Essential hypertension - Plan: losartan (COZAAR) 25 MG tablet ° °At risk for heart disease ° °Class 3 severe obesity with serious comorbidity and body mass index (BMI) of 50.0 to 59.9 in adult, unspecified obesity type (HCC) ° °PLAN: ° °Hypertension °We discussed sodium restriction, working on healthy weight loss, and a regular exercise program as the means to achieve improved blood pressure control. [name]  agreed with this plan and agreed to follow up as directed. We will continue to monitor her blood pressure as well as her progress with the above lifestyle modifications. [name] agrees to continue taking losartan 25 mg PO daily #30 and we will refill for 1 month. She will watch for signs of hypotension as she continues her lifestyle modifications. [name] agrees to follow up with our clinic in 2 weeks. ° °Cardiovascular risk counseling °[name] was given extended (15 minutes) coronary artery disease prevention counseling today. She is 47 y.o. female and has risk factors for heart disease including obesity and hypertension. We discussed intensive lifestyle modifications today with an emphasis on specific weight loss instructions and strategies. Pt was also informed of the importance of increasing exercise and decreasing saturated fats to help prevent heart disease. ° °Pre-Diabetes °[name] will continue to work on weight loss, exercise, and decreasing simple carbohydrates in her diet to help decrease the risk of diabetes. We dicussed metformin including benefits and risks. She was informed that eating too many simple carbohydrates or too many calories at one sitting increases the likelihood of GI side effects. [name] agrees to increase metformin to 1,000 mg PO at night. [name] agrees to follow up with our clinic in 2 weeks as directed to monitor her progress. ° °Obesity °[name] is currently in the action stage of change. °As such, her goal is to continue with weight loss efforts °She has agreed to follow the Category 3 plan °[name] has been   instructed to work up to a goal of 150 minutes of combined cardio and strengthening exercise per week for weight loss and overall health benefits. °We discussed the following Behavioral Modification Strategies today: increasing lean protein intake, increasing vegetables and work on meal planning and easy cooking plans, keeping healthy foods in the home, better snacking choices, and planning for  success ° ° °[name] has agreed to follow up with our clinic in 2 weeks. She was informed of the importance of frequent follow up visits to maximize her success with intensive lifestyle modifications for her multiple health conditions. ° °ALLERGIES: °No Known Allergies ° °MEDICATIONS: °Current Outpatient Medications on File Prior to Visit  °Medication Sig Dispense Refill  °• ALPRAZolam (XANAX) 0.5 MG tablet Take 1 tablet (0.5 mg total) by mouth 3 times/day as needed-between meals & bedtime for anxiety. 40 tablet 0  °• Cyanocobalamin (B-12 COMPLIANCE INJECTION) 1000 MCG/ML KIT Inject 1,000 mcg as directed every 30 (thirty) days.    °• fluconazole (DIFLUCAN) 150 MG tablet Take one dose, repeat after 3 days if needed 2 tablet 0  °• metFORMIN (GLUCOPHAGE) 500 MG tablet Take 1 tablet (500 mg total) by mouth 2 (two) times daily with a meal. 180 tablet 3  °• metoprolol tartrate (LOPRESSOR) 25 MG tablet Take 0.5 tablets (12.5 mg total) by mouth 2 (two) times daily. 90 tablet 3  °• pantoprazole (PROTONIX) 40 MG tablet TAKE 1 TABLET BY MOUTH EVERY DAY 90 tablet 3  °• PARoxetine (PAXIL) 20 MG tablet Take 1 tablet (20 mg total) by mouth daily. 90 tablet 1  °• UNABLE TO FIND Med Name: CPAP    °• valACYclovir (VALTREX) 1000 MG tablet TAKE 2 TABLETS EVERY 12 HOURS FOR 1 DAY START ASAP AFTER SYTPTOM ONSET 12 tablet 11  °• Vitamin D, Ergocalciferol, (DRISDOL) 1.25 MG (50000 UT) CAPS capsule Take 1 capsule by mouth every other week. 6 capsule 0  ° °No current facility-administered medications on file prior to visit.   ° ° °PAST MEDICAL HISTORY: °Past Medical History:  °Diagnosis Date  °• Anxiety   °• ASTHMA   °• B12 deficiency   °• DELAYED GASTRIC EMPTYING   °• DEPRESSION   °• GERD   °• Glucose intolerance (impaired glucose tolerance) 04/09/2015  °• Hypertension   °• OBESITY   °• OBSTRUCTIVE SLEEP APNEA   °• POLYCYSTIC OVARIAN DISEASE   °• Prediabetes   °• SMOKER   °• SOB (shortness of breath)   °• SVT (supraventricular tachycardia)  (HCC)   °• Tachycardia   °• Umbilical hernia   °• Umbilical hernia   °• Vitamin B12 deficiency 03/2015 dx  ° start IM replacement q 30d  °• Vitamin D deficiency 03/2015 dx  ° ° °PAST SURGICAL HISTORY: °Past Surgical History:  °Procedure Laterality Date  °• CESAREAN SECTION  97 & 98  ° x's 2  °• Cocxyl removal  1995  °• DILATION AND CURETTAGE OF UTERUS    °• LAPAROSCOPY    °• TUBAL LIGATION  1998  ° ° °SOCIAL HISTORY: °Social History  ° °Tobacco Use  °• Smoking status: Former Smoker  °  Packs/day: 0.25  °  Years: 30.00  °  Pack years: 7.50  °  Types: Cigarettes  °  Start date: 10/23/1987  °  Quit date: 10/23/2017  °  Years since quitting: 1.0  °• Smokeless tobacco: Never Used  °Substance Use Topics  °• Alcohol use: Yes  °  Alcohol/week: 0.0 standard drinks  °  Comment: Rarely  °•   Drug use: No  ° ° °FAMILY HISTORY: °Family History  °Problem Relation Age of Onset  °• Breast cancer Mother   °     dx 11/2009  °• Diabetes Mother   °• High blood pressure Mother   °• Thyroid disease Mother   °• Anxiety disorder Mother   °• Obesity Mother   °• Diabetes Maternal Aunt   °• Prostate cancer Maternal Grandfather 70  °     also hx colon polyps  °• Heart disease Paternal Grandmother   °• Pancreatic cancer Paternal Grandmother 64  °• High blood pressure Father   °• High Cholesterol Father   °• Depression Father   °• Heart murmur Sister   °• Adrenal disorder Neg Hx   ° ° °ROS: °Review of Systems  °Constitutional: Positive for weight loss.  °Cardiovascular: Negative for chest pain.  °     Negative chest pressure  °Gastrointestinal: Negative for nausea.  °Neurological: Negative for headaches.  °Endo/Heme/Allergies:  °     Negative hypoglycemia  ° ° °PHYSICAL EXAM: °Blood pressure 116/73, pulse 74, temperature 98.3 °F (36.8 °C), temperature source Oral, height 5' 5" (1.651 m), weight (!) 355 lb (161 kg), SpO2 95 %. °Body mass index is 59.08 kg/m². °Physical Exam °Vitals signs reviewed.  °Constitutional:   °   Appearance: Normal appearance.  She is obese.  °Cardiovascular:  °   Rate and Rhythm: Normal rate.  °   Pulses: Normal pulses.  °Pulmonary:  °   Effort: Pulmonary effort is normal.  °   Breath sounds: Normal breath sounds.  °Musculoskeletal: Normal range of motion.  °Skin: °   General: Skin is warm and dry.  °Neurological:  °   Mental Status: She is alert and oriented to person, place, and time.  °Psychiatric:     °   Mood and Affect: Mood normal.     °   Behavior: Behavior normal.  ° ° ° °RECENT LABS AND TESTS: °BMET °   °Component Value Date/Time  ° NA 139 09/12/2018 0904  ° NA 139 02/14/2018 1146  ° K 4.2 09/12/2018 0904  ° CL 103 09/12/2018 0904  ° CO2 24 09/12/2018 0904  ° GLUCOSE 115 (H) 09/12/2018 0904  ° BUN 12 09/12/2018 0904  ° BUN 12 02/14/2018 1146  ° CREATININE 0.97 09/12/2018 0904  ° CALCIUM 9.0 09/12/2018 0904  ° GFRNONAA >60 09/12/2018 0904  ° GFRAA >60 09/12/2018 0904  ° °Lab Results  °Component Value Date  ° HGBA1C 5.8 (H) 09/28/2018  ° HGBA1C 5.7 (H) 02/14/2018  ° HGBA1C 6.1 11/24/2017  ° HGBA1C 5.9 05/19/2017  ° HGBA1C 5.9 05/25/2016  ° °Lab Results  °Component Value Date  ° INSULIN 2.0 (L) 09/28/2018  ° INSULIN 19.1 02/14/2018  ° °CBC °   °Component Value Date/Time  ° WBC 9.8 09/12/2018 0904  ° RBC 4.14 09/12/2018 0904  ° HGB 12.1 09/12/2018 0904  ° HCT 39.0 09/12/2018 0904  ° PLT 386 09/12/2018 0904  ° MCV 94.2 09/12/2018 0904  ° MCH 29.2 09/12/2018 0904  ° MCHC 31.0 09/12/2018 0904  ° RDW 14.0 09/12/2018 0904  ° LYMPHSABS 2.8 06/01/2018 1554  ° MONOABS 0.5 06/01/2018 1554  ° EOSABS 0.2 06/01/2018 1554  ° BASOSABS 0.1 06/01/2018 1554  ° °Iron/TIBC/Ferritin/ %Sat °No results found for: IRON, TIBC, FERRITIN, IRONPCTSAT °Lipid Panel  °   °Component Value Date/Time  ° CHOL 196 09/28/2018 0931  ° TRIG 148 09/28/2018 0931  ° HDL 37 (L) 09/28/2018 0931  °   CHOLHDL 4 11/24/2017 1527   VLDL 23.4 11/24/2017 1527   LDLCALC 129 (H) 09/28/2018 0931   LDLDIRECT 154.1 08/16/2012 0828   Hepatic Function Panel     Component Value  Date/Time   PROT 7.8 09/12/2018 0904   PROT 7.2 02/14/2018 1146   ALBUMIN 3.8 09/12/2018 0904   ALBUMIN 4.1 02/14/2018 1146   AST 10 (L) 09/12/2018 0904   ALT 15 09/12/2018 0904   ALKPHOS 83 09/12/2018 0904   BILITOT 0.3 09/12/2018 0904   BILITOT 0.3 02/14/2018 1146   BILIDIR <0.1 05/17/2018 2112   IBILI NOT CALCULATED 05/17/2018 2112      Component Value Date/Time   TSH 2.480 02/14/2018 1146   TSH 2.94 10/27/2017 1353   TSH 1.80 05/19/2017 1011      OBESITY BEHAVIORAL INTERVENTION VISIT  Today's visit was # 14   Starting weight: 368 lbs Starting date: 02/14/18 Today's weight : 355 lbs Today's date: 11/12/2018 Total lbs lost to date: 13    ASK: We discussed the diagnosis of obesity with [name] today and [name] agreed to give [place] permission to discuss obesity behavioral modification therapy today.  ASSESS: [name] has the diagnosis of obesity and her BMI today is 59.08 [name] is in the action stage of change   ADVISE: [name] was educated on the multiple health risks of obesity as well as the benefit of weight loss to improve her health. She was advised of the need for long term treatment and the importance of lifestyle modifications to improve her current health and to decrease her risk of future health problems.  AGREE: Multiple dietary modification options and treatment options were discussed and  [name] agreed to follow the recommendations documented in the above note.  ARRANGE: [name] was educated on the importance of frequent visits to treat obesity as outlined per CMS and USPSTF guidelines and agreed to schedule her next follow up appointment today.  I, Trixie Dredge, am acting as transcriptionist for Ilene Qua, MD  I have reviewed the above documentation for accuracy and completeness, and I agree with the above. - Ilene Qua, MD

## 2018-11-15 ENCOUNTER — Other Ambulatory Visit (INDEPENDENT_AMBULATORY_CARE_PROVIDER_SITE_OTHER): Payer: Self-pay | Admitting: Family Medicine

## 2018-11-15 DIAGNOSIS — I10 Essential (primary) hypertension: Secondary | ICD-10-CM

## 2018-11-19 ENCOUNTER — Ambulatory Visit (INDEPENDENT_AMBULATORY_CARE_PROVIDER_SITE_OTHER): Payer: BC Managed Care – PPO

## 2018-11-19 DIAGNOSIS — E538 Deficiency of other specified B group vitamins: Secondary | ICD-10-CM | POA: Diagnosis not present

## 2018-11-19 MED ORDER — CYANOCOBALAMIN 1000 MCG/ML IJ SOLN
1000.0000 ug | Freq: Once | INTRAMUSCULAR | Status: AC
  Administered 2018-11-19: 1000 ug via INTRAMUSCULAR

## 2018-11-19 NOTE — Progress Notes (Signed)
b12 Injection given.   Binnie Rail, MD

## 2018-11-27 ENCOUNTER — Ambulatory Visit (INDEPENDENT_AMBULATORY_CARE_PROVIDER_SITE_OTHER): Payer: BC Managed Care – PPO | Admitting: Family Medicine

## 2018-11-27 ENCOUNTER — Other Ambulatory Visit: Payer: Self-pay

## 2018-11-27 ENCOUNTER — Encounter (INDEPENDENT_AMBULATORY_CARE_PROVIDER_SITE_OTHER): Payer: Self-pay | Admitting: Family Medicine

## 2018-11-27 VITALS — BP 127/71 | HR 80 | Temp 97.7°F | Ht 65.0 in | Wt 356.0 lb

## 2018-11-27 DIAGNOSIS — Z9189 Other specified personal risk factors, not elsewhere classified: Secondary | ICD-10-CM

## 2018-11-27 DIAGNOSIS — Z6841 Body Mass Index (BMI) 40.0 and over, adult: Secondary | ICD-10-CM

## 2018-11-27 DIAGNOSIS — R7303 Prediabetes: Secondary | ICD-10-CM

## 2018-11-27 DIAGNOSIS — E559 Vitamin D deficiency, unspecified: Secondary | ICD-10-CM | POA: Diagnosis not present

## 2018-11-27 MED ORDER — VITAMIN D (ERGOCALCIFEROL) 1.25 MG (50000 UNIT) PO CAPS: ORAL_CAPSULE | ORAL | 0 refills | Status: DC

## 2018-11-28 ENCOUNTER — Telehealth: Payer: Self-pay | Admitting: Pulmonary Disease

## 2018-11-28 NOTE — Telephone Encounter (Signed)
Caryl Pina from [facility] called me back they had been trying to get in touch with the patient by phone calls and emails. They finally got in touch with her today and she is scheduled to pick ONO on 12/05/18 because she needed a 4:00 appt. She will also be getting her cpap supplies. I told Caryl Pina that date would be putting it really close to the 30 day rule visit, order ONO and 02 order placed if that is the case. She was going to try and get the patient or someone to pick up the ONO machine sooner then later

## 2018-11-28 NOTE — Telephone Encounter (Signed)
Pt calling inquiring about setting up her ONO with CPAP ordered at her last office visit w/ [facility] 11/09/2018. I have verified that the order has been placed, however, scheduling has not occurred.   PCCs, can you help with this? Pt's DME, [facility], was not included in the original order and I am wondering if that has to do with why the ONO was not scheduled. Please advise, thank you.

## 2018-11-28 NOTE — Telephone Encounter (Signed)
Message sent to [place]

## 2018-11-28 NOTE — Telephone Encounter (Signed)
I just tried to call [facility] and had to leave a message about the ONO order placed on 11/09/18.

## 2018-12-03 NOTE — Progress Notes (Signed)
Office: [phone]  /  Fax: [phone]   HPI:   Chief Complaint: OBESITY [name] is here to discuss her progress with her obesity treatment plan. She is on the Category 3 plan and is following her eating plan approximately 50-60 % of the time. She states she is exercising 0 minutes 0 times per week. [name] voices the first week after her last appointment was hard, secondary to moving out and not being organized and not pre-planning. She is looking for alternative to fat free [employer] milk.  Her weight is (!) 356 lb (161.5 kg) today and has gained 1 lb since her last visit. She has lost 12 lbs since starting treatment with [place].  Vitamin D Deficiency [name] has a diagnosis of vitamin D deficiency. Last Vit D level was of 51.9. She is currently taking prescription Vit D. She notes fatigue and denies nausea, vomiting or muscle weakness.  At risk for osteopenia and osteoporosis [name] is at higher risk of osteopenia and osteoporosis due to vitamin D deficiency.   Pre-Diabetes [name] has a diagnosis of pre-diabetes based on her elevated Hgb A1c and was informed this puts her at greater risk of developing diabetes. Last Hgb A1c was 5.8 in mid June. She notes occasional carbohydrate cravings and indulgent eating. She is taking metformin currently and continues to work on diet and exercise to decrease risk of diabetes. She denies nausea or hypoglycemia.  ASSESSMENT AND PLAN:  Prediabetes  Vitamin D deficiency - Plan: Vitamin D, Ergocalciferol, (DRISDOL) 1.25 MG (50000 UT) CAPS capsule  At risk for osteoporosis  Class 3 severe obesity with serious comorbidity and body mass index (BMI) of 50.0 to 59.9 in adult, unspecified obesity type ([place])  PLAN:  Vitamin D Deficiency [name] was informed that low vitamin D levels contributes to fatigue and are associated with obesity, breast, and colon cancer. [name] agrees to continue taking prescription Vit D 50,000 IU every 14 days #60, 90 day supply with no  refills. She will follow up for routine testing of vitamin D, at least 2-3 times per year. She was informed of the risk of over-replacement of vitamin D and agrees to not increase her dose unless she discusses this with [place] first. [name] agrees to follow up with our clinic in 2 weeks.  At risk for osteopenia and osteoporosis [name] was given extended (15 minutes) osteoporosis prevention counseling today. [name] is at risk for osteopenia and osteoporsis due to her vitamin D deficiency. She was encouraged to take her vitamin D and follow her higher calcium diet and increase strengthening exercise to help strengthen her bones and decrease her risk of osteopenia and osteoporosis.  Pre-Diabetes [name] will continue to work on weight loss, exercise, and decreasing simple carbohydrates in her diet to help decrease the risk of diabetes. We dicussed metformin including benefits and risks. She was informed that eating too many simple carbohydrates or too many calories at one sitting increases the likelihood of GI side effects. [name] agrees to continue taking metformin, no refill needed. [name] agrees to follow up with our clinic in 2 weeks as directed to monitor her progress.  Obesity [name] is currently in the action stage of change. As such, her goal is to continue with weight loss efforts She has agreed to follow the Category 3 plan [name] has been instructed to work up to a goal of 150 minutes of combined cardio and strengthening exercise per week for weight loss and overall health benefits. We discussed the following Behavioral Modification Strategies  today: increasing lean protein intake, increasing vegetables and work on meal planning and easy cooking plans, increase H20 intake, and planning for success   [name] has agreed to follow up with our clinic in 2 weeks. She was informed of the importance of frequent follow up visits to maximize her success with intensive lifestyle modifications for her multiple health  conditions.  ALLERGIES: No Known Allergies  MEDICATIONS: Current Outpatient Medications on File Prior to Visit  Medication Sig Dispense Refill   ALPRAZolam (XANAX) 0.5 MG tablet Take 1 tablet (0.5 mg total) by mouth 3 times/day as needed-between meals & bedtime for anxiety. 40 tablet 0   Cyanocobalamin (B-12 COMPLIANCE INJECTION) 1000 MCG/ML KIT Inject 1,000 mcg as directed every 30 (thirty) days.     fluconazole (DIFLUCAN) 150 MG tablet Take one dose, repeat after 3 days if needed 2 tablet 0   losartan (COZAAR) 25 MG tablet Take 1 tablet (25 mg total) by mouth daily. 30 tablet 0   metFORMIN (GLUCOPHAGE) 500 MG tablet Take 1 tablet (500 mg total) by mouth 2 (two) times daily with a meal. 180 tablet 3   metoprolol tartrate (LOPRESSOR) 25 MG tablet Take 0.5 tablets (12.5 mg total) by mouth 2 (two) times daily. 90 tablet 3   pantoprazole (PROTONIX) 40 MG tablet TAKE 1 TABLET BY MOUTH EVERY DAY 90 tablet 3   PARoxetine (PAXIL) 20 MG tablet Take 1 tablet (20 mg total) by mouth daily. 90 tablet 1   UNABLE TO FIND Med Name: CPAP     valACYclovir (VALTREX) 1000 MG tablet TAKE 2 TABLETS EVERY 12 HOURS FOR 1 DAY START ASAP AFTER SYTPTOM ONSET 12 tablet 11   No current facility-administered medications on file prior to visit.     PAST MEDICAL HISTORY: Past Medical History:  Diagnosis Date   Anxiety    ASTHMA    B12 deficiency    DELAYED GASTRIC EMPTYING    DEPRESSION    GERD    Glucose intolerance (impaired glucose tolerance) 04/09/2015   Hypertension    OBESITY    OBSTRUCTIVE SLEEP APNEA    POLYCYSTIC OVARIAN DISEASE    Prediabetes    SMOKER    SOB (shortness of breath)    SVT (supraventricular tachycardia) (HCC)    Tachycardia    Umbilical hernia    Umbilical hernia    Vitamin B12 deficiency 03/2015 dx   start IM replacement q 30d   Vitamin D deficiency 03/2015 dx    PAST SURGICAL HISTORY: Past Surgical History:  Procedure Laterality Date    CESAREAN SECTION  97 & 98   x's 2   Cocxyl removal  1995   DILATION AND CURETTAGE OF UTERUS     LAPAROSCOPY     TUBAL LIGATION  1998    SOCIAL HISTORY: Social History   Tobacco Use   Smoking status: Former Smoker    Packs/day: 0.25    Years: 30.00    Pack years: 7.50    Types: Cigarettes    Start date: 10/23/1987    Quit date: 10/23/2017    Years since quitting: 1.1   Smokeless tobacco: Never Used  Substance Use Topics   Alcohol use: Yes    Alcohol/week: 0.0 standard drinks    Comment: Rarely   Drug use: No    FAMILY HISTORY: Family History  Problem Relation Age of Onset   Breast cancer Mother        dx 11/2009   Diabetes Mother    High blood pressure Mother  Thyroid disease Mother    Anxiety disorder Mother    Obesity Mother    Diabetes Maternal Aunt    Prostate cancer Maternal Grandfather 85       also hx colon polyps   Heart disease Paternal Grandmother    Pancreatic cancer Paternal Grandmother 68   High blood pressure Father    High Cholesterol Father    Depression Father    Heart murmur Sister    Adrenal disorder Neg Hx     ROS: Review of Systems  Constitutional: Positive for malaise/fatigue. Negative for weight loss.  Gastrointestinal: Negative for nausea and vomiting.  Musculoskeletal:       Negative muscle weakness  Endo/Heme/Allergies:       Negative hypoglycemia    PHYSICAL EXAM: Blood pressure 127/71, pulse 80, temperature 97.7 F (36.5 C), temperature source Oral, height 5' 5"  (1.651 m), weight (!) 356 lb (161.5 kg), SpO2 96 %. Body mass index is 59.24 kg/m. Physical Exam Vitals signs reviewed.  Constitutional:      Appearance: Normal appearance. She is obese.  Cardiovascular:     Rate and Rhythm: Normal rate.     Pulses: Normal pulses.  Pulmonary:     Effort: Pulmonary effort is normal.     Breath sounds: Normal breath sounds.  Musculoskeletal: Normal range of motion.  Skin:    General: Skin is warm and  dry.  Neurological:     Mental Status: She is alert and oriented to person, place, and time.  Psychiatric:        Mood and Affect: Mood normal.        Behavior: Behavior normal.     RECENT LABS AND TESTS: BMET    Component Value Date/Time   NA 139 09/12/2018 0904   NA 139 02/14/2018 1146   K 4.2 09/12/2018 0904   CL 103 09/12/2018 0904   CO2 24 09/12/2018 0904   GLUCOSE 115 (H) 09/12/2018 0904   BUN 12 09/12/2018 0904   BUN 12 02/14/2018 1146   CREATININE 0.97 09/12/2018 0904   CALCIUM 9.0 09/12/2018 0904   GFRNONAA >60 09/12/2018 0904   GFRAA >60 09/12/2018 0904   Lab Results  Component Value Date   HGBA1C 5.8 (H) 09/28/2018   HGBA1C 5.7 (H) 02/14/2018   HGBA1C 6.1 11/24/2017   HGBA1C 5.9 05/19/2017   HGBA1C 5.9 05/25/2016   Lab Results  Component Value Date   INSULIN 2.0 (L) 09/28/2018   INSULIN 19.1 02/14/2018   CBC    Component Value Date/Time   WBC 9.8 09/12/2018 0904   RBC 4.14 09/12/2018 0904   HGB 12.1 09/12/2018 0904   HCT 39.0 09/12/2018 0904   PLT 386 09/12/2018 0904   MCV 94.2 09/12/2018 0904   MCH 29.2 09/12/2018 0904   MCHC 31.0 09/12/2018 0904   RDW 14.0 09/12/2018 0904   LYMPHSABS 2.8 06/01/2018 1554   MONOABS 0.5 06/01/2018 1554   EOSABS 0.2 06/01/2018 1554   BASOSABS 0.1 06/01/2018 1554   Iron/TIBC/Ferritin/ %Sat No results found for: IRON, TIBC, FERRITIN, IRONPCTSAT Lipid Panel     Component Value Date/Time   CHOL 196 09/28/2018 0931   TRIG 148 09/28/2018 0931   HDL 37 (L) 09/28/2018 0931   CHOLHDL 4 11/24/2017 1527   VLDL 23.4 11/24/2017 1527   LDLCALC 129 (H) 09/28/2018 0931   LDLDIRECT 154.1 08/16/2012 0828   Hepatic Function Panel     Component Value Date/Time   PROT 7.8 09/12/2018 0904   PROT 7.2 02/14/2018 1146  ALBUMIN 3.8 09/12/2018 0904   ALBUMIN 4.1 02/14/2018 1146   AST 10 (L) 09/12/2018 0904   ALT 15 09/12/2018 0904   ALKPHOS 83 09/12/2018 0904   BILITOT 0.3 09/12/2018 0904   BILITOT 0.3 02/14/2018 1146    BILIDIR <0.1 05/17/2018 2112   IBILI NOT CALCULATED 05/17/2018 2112      Component Value Date/Time   TSH 2.480 02/14/2018 1146   TSH 2.94 10/27/2017 1353   TSH 1.80 05/19/2017 1011      OBESITY BEHAVIORAL INTERVENTION VISIT  Today's visit was # 15   Starting weight: 368 lbs Starting date: 02/14/18 Today's weight : 356 lbs Today's date: 11/27/2018 Total lbs lost to date: 12    ASK: We discussed the diagnosis of obesity with [name] today and Molina agreed to give [place] permission to discuss obesity behavioral modification therapy today.  ASSESS: Elanore has the diagnosis of obesity and her BMI today is 59.24 Carmita is in the action stage of change   ADVISE: Shahida was educated on the multiple health risks of obesity as well as the benefit of weight loss to improve her health. She was advised of the need for long term treatment and the importance of lifestyle modifications to improve her current health and to decrease her risk of future health problems.  AGREE: Multiple dietary modification options and treatment options were discussed and  Kaycee agreed to follow the recommendations documented in the above note.  ARRANGE: Reatha was educated on the importance of frequent visits to treat obesity as outlined per CMS and USPSTF guidelines and agreed to schedule her next follow up appointment today.  I, Trixie Dredge, am acting as transcriptionist for Ilene Qua, MD  I have reviewed the above documentation for accuracy and completeness, and I agree with the above. - Ilene Qua, MD

## 2018-12-06 ENCOUNTER — Telehealth: Payer: Self-pay | Admitting: Pulmonary Disease

## 2018-12-06 NOTE — Telephone Encounter (Signed)
Call made to patient, confirmed DOB, made aware of ONO results. Voiced understanding. Nothing further is needed at this time.

## 2018-12-06 NOTE — Telephone Encounter (Signed)
ONO with CPAP 11/29/18 >> test time 8 hrs 50 min.  Baseline SpO2 95%, SpO2 low 89%.   Please let her know her oxygen test looked good.  She does not need oxygen at night or a repeat sleep study at this time.

## 2018-12-11 ENCOUNTER — Ambulatory Visit (INDEPENDENT_AMBULATORY_CARE_PROVIDER_SITE_OTHER): Payer: BC Managed Care – PPO | Admitting: Family Medicine

## 2018-12-20 ENCOUNTER — Other Ambulatory Visit: Payer: Self-pay

## 2018-12-20 ENCOUNTER — Ambulatory Visit (INDEPENDENT_AMBULATORY_CARE_PROVIDER_SITE_OTHER): Payer: BC Managed Care – PPO

## 2018-12-20 DIAGNOSIS — E538 Deficiency of other specified B group vitamins: Secondary | ICD-10-CM | POA: Diagnosis not present

## 2018-12-20 MED ORDER — CYANOCOBALAMIN 1000 MCG/ML IJ SOLN
1000.0000 ug | Freq: Once | INTRAMUSCULAR | Status: AC
  Administered 2018-12-20: 1000 ug via INTRAMUSCULAR

## 2018-12-20 NOTE — Progress Notes (Signed)
b12 Injection given.   Binnie Rail, MD

## 2018-12-25 ENCOUNTER — Ambulatory Visit (INDEPENDENT_AMBULATORY_CARE_PROVIDER_SITE_OTHER): Payer: BC Managed Care – PPO | Admitting: Family Medicine

## 2018-12-25 ENCOUNTER — Other Ambulatory Visit: Payer: Self-pay

## 2018-12-25 ENCOUNTER — Encounter (INDEPENDENT_AMBULATORY_CARE_PROVIDER_SITE_OTHER): Payer: Self-pay | Admitting: Family Medicine

## 2018-12-25 VITALS — BP 119/73 | HR 71 | Temp 97.7°F | Ht 65.0 in | Wt 357.0 lb

## 2018-12-25 DIAGNOSIS — R7303 Prediabetes: Secondary | ICD-10-CM

## 2018-12-25 DIAGNOSIS — Z9189 Other specified personal risk factors, not elsewhere classified: Secondary | ICD-10-CM | POA: Diagnosis not present

## 2018-12-25 DIAGNOSIS — I10 Essential (primary) hypertension: Secondary | ICD-10-CM

## 2018-12-25 DIAGNOSIS — Z6841 Body Mass Index (BMI) 40.0 and over, adult: Secondary | ICD-10-CM

## 2018-12-25 MED ORDER — TOPIRAMATE 25 MG PO TABS: 25.0000 mg | ORAL_TABLET | Freq: Every day | ORAL | 0 refills | Status: DC

## 2018-12-25 MED ORDER — LOSARTAN POTASSIUM 25 MG PO TABS: 25.0000 mg | ORAL_TABLET | Freq: Every day | ORAL | 0 refills | Status: DC

## 2018-12-25 MED ORDER — METFORMIN HCL 500 MG PO TABS: 500.0000 mg | ORAL_TABLET | Freq: Two times a day (BID) | ORAL | 3 refills | Status: DC

## 2018-12-26 NOTE — Progress Notes (Signed)
Office: [phone]  /  Fax: [phone]   HPI:   Chief Complaint: OBESITY [name] is here to discuss her progress with her obesity treatment plan. She is on the Category 3 plan and is following her eating plan approximately 75 % of the time. She states she is exercising 0 minutes 0 times per week. [name] is still trying to follow the plan but struggling with snacking. Sometimes she is hungry and sometimes craving. She has been eating indulgent snacks.  Her weight is (!) 357 lb (161.9 kg) today and has gained 1 lb since her last visit. She has lost 11 lbs since starting treatment with [place].  Pre-Diabetes [name] has a diagnosis of pre-diabetes based on her elevated Hgb A1c and was informed this puts her at greater risk of developing diabetes. She is on metformin BID and denies GI side effects. She continues to work on diet and exercise to decrease risk of diabetes. She denies hypoglycemia.  At risk for diabetes [name] is at higher than average risk for developing diabetes due to her obesity and pre-diabetes. She currently denies polyuria or polydipsia.  Hypertension [name] is a 48 y.o. female with hypertension. [name]'s blood pressure is controlled today. She denies chest pain, chest pressure, or headaches. She is working on weight loss to help control her blood pressure with the goal of decreasing her risk of heart attack and stroke.   ASSESSMENT AND PLAN:  Prediabetes - Plan: metFORMIN (GLUCOPHAGE) 500 MG tablet  Essential hypertension - Plan: losartan (COZAAR) 25 MG tablet  At risk for diabetes mellitus  Class 3 severe obesity with serious comorbidity and body mass index (BMI) of 50.0 to 59.9 in adult, unspecified obesity type ([place]) - Plan: topiramate (TOPAMAX) 25 MG tablet  PLAN:  Pre-Diabetes [name] will continue to work on weight loss, exercise, and decreasing simple carbohydrates in her diet to help decrease the risk of diabetes. We dicussed metformin including benefits and risks.  She was informed that eating too many simple carbohydrates or too many calories at one sitting increases the likelihood of GI side effects. [name] agrees to continue taking metformin 500 mg PO BID #60 and we will refill for 1 month. [name] agrees to follow up with our clinic in 2 weeks as directed to monitor her progress.  Diabetes risk counseling [name] was given extended (15 minutes) diabetes prevention counseling today. She is 48 y.o. female and has risk factors for diabetes including obesity and pre-diabetes. We discussed intensive lifestyle modifications today with an emphasis on weight loss as well as increasing exercise and decreasing simple carbohydrates in her diet.  Hypertension We discussed sodium restriction, working on healthy weight loss, and a regular exercise program as the means to achieve improved blood pressure control. [name] agreed with this plan and agreed to follow up as directed. We will continue to monitor her blood pressure as well as her progress with the above lifestyle modifications. [name] agrees to continue taking losartan 25 mg PO daily #30 and we will refill for 1 month. She will watch for signs of hypotension as she continues her lifestyle modifications. [name] agrees to follow up with our clinic in 2 weeks.  Obesity [name] is currently in the action stage of change. As such, her goal is to continue with weight loss efforts She has agreed to follow the Category 3 plan [name] has been instructed to work up to a goal of 150 minutes of combined cardio and strengthening exercise per week for weight loss and  overall health benefits. We discussed the following Behavioral Modification Strategies today: increasing lean protein intake, increasing vegetables and work on meal planning and easy cooking plans, better snacking choices, and planning for success [name] agrees to start topiramate 25 mg PO daily #30 with no refills.  [name] has agreed to follow up with our clinic in 2 weeks. She was  informed of the importance of frequent follow up visits to maximize her success with intensive lifestyle modifications for her multiple health conditions.  ALLERGIES: No Known Allergies  MEDICATIONS: Current Outpatient Medications on File Prior to Visit  Medication Sig Dispense Refill   ALPRAZolam (XANAX) 0.5 MG tablet Take 1 tablet (0.5 mg total) by mouth 3 times/day as needed-between meals & bedtime for anxiety. 40 tablet 0   Cyanocobalamin (B-12 COMPLIANCE INJECTION) 1000 MCG/ML KIT Inject 1,000 mcg as directed every 30 (thirty) days.     fluconazole (DIFLUCAN) 150 MG tablet Take one dose, repeat after 3 days if needed 2 tablet 0   metoprolol tartrate (LOPRESSOR) 25 MG tablet Take 0.5 tablets (12.5 mg total) by mouth 2 (two) times daily. 90 tablet 3   pantoprazole (PROTONIX) 40 MG tablet TAKE 1 TABLET BY MOUTH EVERY DAY 90 tablet 3   PARoxetine (PAXIL) 20 MG tablet Take 1 tablet (20 mg total) by mouth daily. 90 tablet 1   UNABLE TO FIND Med Name: CPAP     valACYclovir (VALTREX) 1000 MG tablet TAKE 2 TABLETS EVERY 12 HOURS FOR 1 DAY START ASAP AFTER SYTPTOM ONSET 12 tablet 11   Vitamin D, Ergocalciferol, (DRISDOL) 1.25 MG (50000 UT) CAPS capsule Take 1 capsule by mouth every other week. 6 capsule 0   No current facility-administered medications on file prior to visit.     PAST MEDICAL HISTORY: Past Medical History:  Diagnosis Date   Anxiety    ASTHMA    B12 deficiency    DELAYED GASTRIC EMPTYING    DEPRESSION    GERD    Glucose intolerance (impaired glucose tolerance) 04/09/2015   Hypertension    OBESITY    OBSTRUCTIVE SLEEP APNEA    POLYCYSTIC OVARIAN DISEASE    Prediabetes    SMOKER    SOB (shortness of breath)    SVT (supraventricular tachycardia) (HCC)    Tachycardia    Umbilical hernia    Umbilical hernia    Vitamin B12 deficiency 03/2015 dx   start IM replacement q 30d   Vitamin D deficiency 03/2015 dx    PAST SURGICAL HISTORY: Past  Surgical History:  Procedure Laterality Date   CESAREAN SECTION  97 & 98   x's 2   Cocxyl removal  1995   DILATION AND CURETTAGE OF UTERUS     LAPAROSCOPY     TUBAL LIGATION  1998    SOCIAL HISTORY: Social History   Tobacco Use   Smoking status: Former Smoker    Packs/day: 0.25    Years: 30.00    Pack years: 7.50    Types: Cigarettes    Start date: 10/23/1987    Quit date: 10/23/2017    Years since quitting: 1.1   Smokeless tobacco: Never Used  Substance Use Topics   Alcohol use: Yes    Alcohol/week: 0.0 standard drinks    Comment: Rarely   Drug use: No    FAMILY HISTORY: Family History  Problem Relation Age of Onset   Breast cancer Mother        dx 11/2009   Diabetes Mother    High blood pressure  Mother    Thyroid disease Mother    Anxiety disorder Mother    Obesity Mother    Diabetes Maternal Aunt    Prostate cancer Maternal Grandfather 36       also hx colon polyps   Heart disease Paternal Grandmother    Pancreatic cancer Paternal Grandmother 92   High blood pressure Father    High Cholesterol Father    Depression Father    Heart murmur Sister    Adrenal disorder Neg Hx     ROS: Review of Systems  Constitutional: Negative for weight loss.  Cardiovascular: Negative for chest pain.       Negative chest pressure  Genitourinary: Negative for frequency.  Neurological: Negative for headaches.  Endo/Heme/Allergies: Negative for polydipsia.       Negative hypoglycemia    PHYSICAL EXAM: Blood pressure 119/73, pulse 71, temperature 97.7 F (36.5 C), temperature source Oral, height 5' 5"  (1.651 m), weight (!) 357 lb (161.9 kg), SpO2 96 %. Body mass index is 59.41 kg/m. Physical Exam Vitals signs reviewed.  Constitutional:      Appearance: Normal appearance. She is obese.  Cardiovascular:     Rate and Rhythm: Normal rate.     Pulses: Normal pulses.  Pulmonary:     Effort: Pulmonary effort is normal.     Breath sounds: Normal  breath sounds.  Musculoskeletal: Normal range of motion.  Skin:    General: Skin is warm and dry.  Neurological:     Mental Status: She is alert and oriented to person, place, and time.  Psychiatric:        Mood and Affect: Mood normal.        Behavior: Behavior normal.     RECENT LABS AND TESTS: BMET    Component Value Date/Time   NA 139 09/12/2018 0904   NA 139 02/14/2018 1146   K 4.2 09/12/2018 0904   CL 103 09/12/2018 0904   CO2 24 09/12/2018 0904   GLUCOSE 115 (H) 09/12/2018 0904   BUN 12 09/12/2018 0904   BUN 12 02/14/2018 1146   CREATININE 0.97 09/12/2018 0904   CALCIUM 9.0 09/12/2018 0904   GFRNONAA >60 09/12/2018 0904   GFRAA >60 09/12/2018 0904   Lab Results  Component Value Date   HGBA1C 5.8 (H) 09/28/2018   HGBA1C 5.7 (H) 02/14/2018   HGBA1C 6.1 11/24/2017   HGBA1C 5.9 05/19/2017   HGBA1C 5.9 05/25/2016   Lab Results  Component Value Date   INSULIN 2.0 (L) 09/28/2018   INSULIN 19.1 02/14/2018   CBC    Component Value Date/Time   WBC 9.8 09/12/2018 0904   RBC 4.14 09/12/2018 0904   HGB 12.1 09/12/2018 0904   HCT 39.0 09/12/2018 0904   PLT 386 09/12/2018 0904   MCV 94.2 09/12/2018 0904   MCH 29.2 09/12/2018 0904   MCHC 31.0 09/12/2018 0904   RDW 14.0 09/12/2018 0904   LYMPHSABS 2.8 06/01/2018 1554   MONOABS 0.5 06/01/2018 1554   EOSABS 0.2 06/01/2018 1554   BASOSABS 0.1 06/01/2018 1554   Iron/TIBC/Ferritin/ %Sat No results found for: IRON, TIBC, FERRITIN, IRONPCTSAT Lipid Panel     Component Value Date/Time   CHOL 196 09/28/2018 0931   TRIG 148 09/28/2018 0931   HDL 37 (L) 09/28/2018 0931   CHOLHDL 4 11/24/2017 1527   VLDL 23.4 11/24/2017 1527   LDLCALC 129 (H) 09/28/2018 0931   LDLDIRECT 154.1 08/16/2012 0828   Hepatic Function Panel     Component Value Date/Time  PROT 7.8 09/12/2018 0904   PROT 7.2 02/14/2018 1146   ALBUMIN 3.8 09/12/2018 0904   ALBUMIN 4.1 02/14/2018 1146   AST 10 (L) 09/12/2018 0904   ALT 15 09/12/2018  0904   ALKPHOS 83 09/12/2018 0904   BILITOT 0.3 09/12/2018 0904   BILITOT 0.3 02/14/2018 1146   BILIDIR <0.1 05/17/2018 2112   IBILI NOT CALCULATED 05/17/2018 2112      Component Value Date/Time   TSH 2.480 02/14/2018 1146   TSH 2.94 10/27/2017 1353   TSH 1.80 05/19/2017 1011      OBESITY BEHAVIORAL INTERVENTION VISIT  Today's visit was # 16   Starting weight: 368 lbs Starting date: 02/14/18 Today's weight : 357 lbs  Today's date: 12/25/2018 Total lbs lost to date: 11    ASK: We discussed the diagnosis of obesity with [name] today and [name] agreed to give [place] permission to discuss obesity behavioral modification therapy today.  ASSESS: Jacqueli has the diagnosis of obesity and her BMI today is 59.41 Jozi is in the action stage of change   ADVISE: Media was educated on the multiple health risks of obesity as well as the benefit of weight loss to improve her health. She was advised of the need for long term treatment and the importance of lifestyle modifications to improve her current health and to decrease her risk of future health problems.  AGREE: Multiple dietary modification options and treatment options were discussed and  [name] agreed to follow the recommendations documented in the above note.  ARRANGE: Tiani was educated on the importance of frequent visits to treat obesity as outlined per CMS and USPSTF guidelines and agreed to schedule her next follow up appointment today.  I, Trixie Dredge, am acting as transcriptionist for Ilene Qua, MD  I have reviewed the above documentation for accuracy and completeness, and I agree with the above. - Ilene Qua, MD

## 2019-01-08 ENCOUNTER — Telehealth (INDEPENDENT_AMBULATORY_CARE_PROVIDER_SITE_OTHER): Payer: BC Managed Care – PPO | Admitting: Family Medicine

## 2019-01-08 ENCOUNTER — Other Ambulatory Visit: Payer: Self-pay

## 2019-01-08 ENCOUNTER — Encounter (INDEPENDENT_AMBULATORY_CARE_PROVIDER_SITE_OTHER): Payer: Self-pay | Admitting: Family Medicine

## 2019-01-08 DIAGNOSIS — E7849 Other hyperlipidemia: Secondary | ICD-10-CM | POA: Diagnosis not present

## 2019-01-08 DIAGNOSIS — Z6841 Body Mass Index (BMI) 40.0 and over, adult: Secondary | ICD-10-CM

## 2019-01-08 DIAGNOSIS — R7303 Prediabetes: Secondary | ICD-10-CM | POA: Diagnosis not present

## 2019-01-08 NOTE — Progress Notes (Signed)
Office: [phone]  /  Fax: [phone] TeleHealth Visit:  [name] has verbally consented to this TeleHealth visit today. The patient is located at home, the provider is located at the [facility] office. The participants in this visit include the listed provider and patient. The visit was conducted today via Facetime.  HPI:   Chief Complaint: OBESITY [name] is here to discuss her progress with her obesity treatment plan. She is on the  follow the Category 3 plan and is following her eating plan approximately 80 % of the time. She states she is exercising 0 minutes 0 times per week. Jazmene had a birthday 4 days ago and did a painting class then went to [place] with her son. She is craving sweets significantly and voices she has not started Topamax yet.   We were unable to weigh the patient today for this TeleHealth visit. She feels as if she has gained weight since her last visit. She has lost 11 lbs since starting treatment with [place].  Pre-Diabetes [name] has a diagnosis of prediabetes based on her elevated HgA1c of 5.8 and Insulin of 2.0 and was informed this puts her at greater risk of developing diabetes. She is taking metformin BID currently and continues to work on diet and exercise to decrease risk of diabetes. She denies nausea or hypoglycemia.  Hyperlipidemia [name] has hyperlipidemia (LDL 129 and HDL 37) and has been trying to improve her cholesterol levels with intensive lifestyle modification including a low saturated fat diet, exercise and weight loss. She denies any chest pain, claudication or myalgias. She is not currently on a statin.    ASSESSMENT AND PLAN:  Prediabetes  Other hyperlipidemia  Class 3 severe obesity with serious comorbidity and body mass index (BMI) of 50.0 to 59.9 in adult, unspecified obesity type ([name])  PLAN: Pre-Diabetes [name] will continue to work on weight loss, exercise, and decreasing simple carbohydrates in her diet to help  decrease the risk of diabetes. We dicussed metformin including benefits and risks. She was informed that eating too many simple carbohydrates or too many calories at one sitting increases the likelihood of GI side effects. Netta agrees to continue Metformin as prescribed. [name] agreed to follow up with [place] as directed to monitor her progress.  Hyperlipidemia [name] was informed of the American Heart Association Guidelines emphasizing intensive lifestyle modifications as the first line treatment for hyperlipidemia. We discussed many lifestyle modifications today in depth, and [name] will continue to work on decreasing saturated fats such as fatty red meat, butter and many fried foods. She will also increase vegetables and lean protein in her diet and continue to work on exercise and weight loss efforts. We will repeat labs in next appointment.   Obesity [name] is currently in the action stage of change. As such, her goal is to continue with weight loss efforts She has agreed to follow the Category 3 plan [name] has been instructed to work up to a goal of 150 minutes of combined cardio and strengthening exercise per week for weight loss and overall health benefits. We discussed the following Behavioral Modification Strategies today:keeping healthy foods in the home, planning for success,  increasing lean protein intake, increasing vegetables and work on meal planning and easy cooking plans   [name] has agreed to follow up with our clinic in 2 weeks. She was informed of the importance of frequent follow up visits to maximize her success with intensive lifestyle modifications for her multiple health conditions.  ALLERGIES: No Known Allergies  MEDICATIONS: Current Outpatient Medications on File Prior to Visit  Medication Sig Dispense Refill  . ALPRAZolam (XANAX) 0.5 MG tablet Take 1 tablet (0.5 mg total) by mouth 3 times/day as needed-between meals & bedtime for anxiety. 40 tablet 0  . Cyanocobalamin (B-12  COMPLIANCE INJECTION) 1000 MCG/ML KIT Inject 1,000 mcg as directed every 30 (thirty) days.    . fluconazole (DIFLUCAN) 150 MG tablet Take one dose, repeat after 3 days if needed 2 tablet 0  . losartan (COZAAR) 25 MG tablet Take 1 tablet (25 mg total) by mouth daily. 30 tablet 0  . metFORMIN (GLUCOPHAGE) 500 MG tablet Take 1 tablet (500 mg total) by mouth 2 (two) times daily with a meal. 180 tablet 3  . metoprolol tartrate (LOPRESSOR) 25 MG tablet Take 0.5 tablets (12.5 mg total) by mouth 2 (two) times daily. 90 tablet 3  . pantoprazole (PROTONIX) 40 MG tablet TAKE 1 TABLET BY MOUTH EVERY DAY 90 tablet 3  . PARoxetine (PAXIL) 20 MG tablet Take 1 tablet (20 mg total) by mouth daily. 90 tablet 1  . UNABLE TO FIND Med Name: CPAP    . valACYclovir (VALTREX) 1000 MG tablet TAKE 2 TABLETS EVERY 12 HOURS FOR 1 DAY START ASAP AFTER SYTPTOM ONSET 12 tablet 11  . Vitamin D, Ergocalciferol, (DRISDOL) 1.25 MG (50000 UT) CAPS capsule Take 1 capsule by mouth every other week. 6 capsule 0  . topiramate (TOPAMAX) 25 MG tablet Take 1 tablet (25 mg total) by mouth daily. (Patient not taking: Reported on 01/08/2019) 30 tablet 0   No current facility-administered medications on file prior to visit.     PAST MEDICAL HISTORY: Past Medical History:  Diagnosis Date  . Anxiety   . ASTHMA   . B12 deficiency   . DELAYED GASTRIC EMPTYING   . DEPRESSION   . GERD   . Glucose intolerance (impaired glucose tolerance) 04/09/2015  . Hypertension   . OBESITY   . OBSTRUCTIVE SLEEP APNEA   . POLYCYSTIC OVARIAN DISEASE   . Prediabetes   . SMOKER   . SOB (shortness of breath)   . SVT (supraventricular tachycardia) ([place])   . Tachycardia   . Umbilical hernia   . Umbilical hernia   . Vitamin B12 deficiency 03/2015 dx   start IM replacement q 30d  . Vitamin D deficiency 03/2015 dx    PAST SURGICAL HISTORY: Past Surgical History:  Procedure Laterality Date  . CESAREAN SECTION  97 & 98   x's 2  . Cocxyl removal  1995   . DILATION AND CURETTAGE OF UTERUS    . LAPAROSCOPY    . TUBAL LIGATION  1998    SOCIAL HISTORY: Social History   Tobacco Use  . Smoking status: Former Smoker    Packs/day: 0.25    Years: 30.00    Pack years: 7.50    Types: Cigarettes    Start date: 10/23/1987    Quit date: 10/23/2017    Years since quitting: 1.2  . Smokeless tobacco: Never Used  Substance Use Topics  . Alcohol use: Yes    Alcohol/week: 0.0 standard drinks    Comment: Rarely  . Drug use: No    FAMILY HISTORY: Family History  Problem Relation Age of Onset  . Breast cancer Mother        dx 11/2009  . Diabetes Mother   . High blood pressure Mother   . Thyroid disease Mother   . Anxiety disorder Mother   .  Obesity Mother   . Diabetes Maternal Aunt   . Prostate cancer Maternal Grandfather 14       also hx colon polyps  . Heart disease Paternal Grandmother   . Pancreatic cancer Paternal Grandmother 56  . High blood pressure Father   . High Cholesterol Father   . Depression Father   . Heart murmur Sister   . Adrenal disorder Neg Hx     ROS: Review of Systems  Cardiovascular: Negative for chest pain and claudication.  Gastrointestinal: Negative for nausea.  Musculoskeletal: Negative for myalgias.  Endo/Heme/Allergies:       Negative for hypoglycemia     PHYSICAL EXAM: Pt in no acute distress  RECENT LABS AND TESTS: BMET    Component Value Date/Time   NA 139 09/12/2018 0904   NA 139 02/14/2018 1146   K 4.2 09/12/2018 0904   CL 103 09/12/2018 0904   CO2 24 09/12/2018 0904   GLUCOSE 115 (H) 09/12/2018 0904   BUN 12 09/12/2018 0904   BUN 12 02/14/2018 1146   CREATININE 0.97 09/12/2018 0904   CALCIUM 9.0 09/12/2018 0904   GFRNONAA >60 09/12/2018 0904   GFRAA >60 09/12/2018 0904   Lab Results  Component Value Date   HGBA1C 5.8 (H) 09/28/2018   HGBA1C 5.7 (H) 02/14/2018   HGBA1C 6.1 11/24/2017   HGBA1C 5.9 05/19/2017   HGBA1C 5.9 05/25/2016   Lab Results  Component Value Date    INSULIN 2.0 (L) 09/28/2018   INSULIN 19.1 02/14/2018   CBC    Component Value Date/Time   WBC 9.8 09/12/2018 0904   RBC 4.14 09/12/2018 0904   HGB 12.1 09/12/2018 0904   HCT 39.0 09/12/2018 0904   PLT 386 09/12/2018 0904   MCV 94.2 09/12/2018 0904   MCH 29.2 09/12/2018 0904   MCHC 31.0 09/12/2018 0904   RDW 14.0 09/12/2018 0904   LYMPHSABS 2.8 06/01/2018 1554   MONOABS 0.5 06/01/2018 1554   EOSABS 0.2 06/01/2018 1554   BASOSABS 0.1 06/01/2018 1554   Iron/TIBC/Ferritin/ %Sat No results found for: IRON, TIBC, FERRITIN, IRONPCTSAT Lipid Panel     Component Value Date/Time   CHOL 196 09/28/2018 0931   TRIG 148 09/28/2018 0931   HDL 37 (L) 09/28/2018 0931   CHOLHDL 4 11/24/2017 1527   VLDL 23.4 11/24/2017 1527   LDLCALC 129 (H) 09/28/2018 0931   LDLDIRECT 154.1 08/16/2012 0828   Hepatic Function Panel     Component Value Date/Time   PROT 7.8 09/12/2018 0904   PROT 7.2 02/14/2018 1146   ALBUMIN 3.8 09/12/2018 0904   ALBUMIN 4.1 02/14/2018 1146   AST 10 (L) 09/12/2018 0904   ALT 15 09/12/2018 0904   ALKPHOS 83 09/12/2018 0904   BILITOT 0.3 09/12/2018 0904   BILITOT 0.3 02/14/2018 1146   BILIDIR <0.1 05/17/2018 2112   IBILI NOT CALCULATED 05/17/2018 2112      Component Value Date/Time   TSH 2.480 02/14/2018 1146   TSH 2.94 10/27/2017 1353   TSH 1.80 05/19/2017 1011      I, Renee Ramus, am acting as Location manager for Ilene Qua, MD   I have reviewed the above documentation for accuracy and completeness, and I agree with the above. - Ilene Qua, MD

## 2019-01-16 ENCOUNTER — Other Ambulatory Visit (INDEPENDENT_AMBULATORY_CARE_PROVIDER_SITE_OTHER): Payer: Self-pay | Admitting: Family Medicine

## 2019-01-16 DIAGNOSIS — Z6841 Body Mass Index (BMI) 40.0 and over, adult: Secondary | ICD-10-CM

## 2019-01-17 ENCOUNTER — Ambulatory Visit (INDEPENDENT_AMBULATORY_CARE_PROVIDER_SITE_OTHER): Payer: BC Managed Care – PPO

## 2019-01-17 ENCOUNTER — Other Ambulatory Visit: Payer: Self-pay

## 2019-01-17 DIAGNOSIS — E538 Deficiency of other specified B group vitamins: Secondary | ICD-10-CM

## 2019-01-17 MED ORDER — CYANOCOBALAMIN 1000 MCG/ML IJ SOLN
1000.0000 ug | Freq: Once | INTRAMUSCULAR | Status: AC
  Administered 2019-01-17: 1000 ug via INTRAMUSCULAR

## 2019-01-17 NOTE — Progress Notes (Signed)
b12 Injection given.   Binnie Rail, MD

## 2019-01-24 ENCOUNTER — Other Ambulatory Visit: Payer: Self-pay

## 2019-01-24 ENCOUNTER — Ambulatory Visit (INDEPENDENT_AMBULATORY_CARE_PROVIDER_SITE_OTHER): Payer: BC Managed Care – PPO | Admitting: Family Medicine

## 2019-01-24 ENCOUNTER — Encounter (INDEPENDENT_AMBULATORY_CARE_PROVIDER_SITE_OTHER): Payer: Self-pay | Admitting: Family Medicine

## 2019-01-24 VITALS — BP 128/78 | HR 77 | Temp 97.7°F | Ht 65.0 in | Wt 357.0 lb

## 2019-01-24 DIAGNOSIS — Z9189 Other specified personal risk factors, not elsewhere classified: Secondary | ICD-10-CM | POA: Diagnosis not present

## 2019-01-24 DIAGNOSIS — I10 Essential (primary) hypertension: Secondary | ICD-10-CM

## 2019-01-24 DIAGNOSIS — E559 Vitamin D deficiency, unspecified: Secondary | ICD-10-CM | POA: Diagnosis not present

## 2019-01-24 DIAGNOSIS — Z6841 Body Mass Index (BMI) 40.0 and over, adult: Secondary | ICD-10-CM

## 2019-01-24 MED ORDER — TOPIRAMATE 25 MG PO TABS: 25.0000 mg | ORAL_TABLET | Freq: Every day | ORAL | 0 refills | Status: DC

## 2019-01-24 MED ORDER — VITAMIN D (ERGOCALCIFEROL) 1.25 MG (50000 UNIT) PO CAPS: ORAL_CAPSULE | ORAL | 0 refills | Status: DC

## 2019-01-24 MED ORDER — LOSARTAN POTASSIUM 25 MG PO TABS: 25.0000 mg | ORAL_TABLET | Freq: Every day | ORAL | 0 refills | Status: DC

## 2019-01-28 ENCOUNTER — Other Ambulatory Visit: Payer: Self-pay | Admitting: Internal Medicine

## 2019-01-28 DIAGNOSIS — Z1231 Encounter for screening mammogram for malignant neoplasm of breast: Secondary | ICD-10-CM

## 2019-01-28 NOTE — Progress Notes (Signed)
Office: [phone]  /  Fax: [phone]   HPI:   Chief Complaint: OBESITY [name] is here to discuss her progress with her obesity treatment plan. She is on the Category 3 plan and is following her eating plan approximately 100 % of the time. She states she is exercising 0 minutes 0 times per week. [name] voices this week has been very much more on the plan. She started Topamax and felt more fatigued, but she did cut out soda again. It has definitely helped with binge type eating. She has started taking supper to work to eat earlier at night. She is interested in recipes.  Her weight is (!) 357 lb (161.9 kg) today and has not lost weight since her last visit. She has lost 11 lbs since starting treatment with [place].  Hypertension [name] is a 48 y.o. female with hypertension. [name]'s blood pressure is controlled. She denies chest pain, chest pressure, or headaches. She is working on weight loss to help control her blood pressure with the goal of decreasing her risk of heart attack and stroke.   At risk for cardiovascular disease [name] is at a higher than average risk for cardiovascular disease due to obesity and hypertension. She currently denies any chest pain.  Vitamin D Deficiency [name] has a diagnosis of vitamin D deficiency. She is currently taking prescription Vit D. She notes fatigue and denies nausea, vomiting or muscle weakness.  ASSESSMENT AND PLAN:  At risk for heart disease  Essential hypertension - Plan: losartan (COZAAR) 25 MG tablet  Vitamin D deficiency - Plan: Vitamin D, Ergocalciferol, (DRISDOL) 1.25 MG (50000 UT) CAPS capsule  Class 3 severe obesity with serious comorbidity and body mass index (BMI) of 50.0 to 59.9 in adult, unspecified obesity type ([name]) - Plan: topiramate (TOPAMAX) 25 MG tablet  PLAN:  Hypertension We discussed sodium restriction, working on healthy weight loss, and a regular exercise program as the means to achieve improved blood pressure  control. [name] agreed with this plan and agreed to follow up as directed. We will continue to monitor her blood pressure as well as her progress with the above lifestyle modifications. [name] agrees to continue taking losartan 25 mg PO daily #30 and we will refill for 1 month. She will watch for signs of hypotension as she continues her lifestyle modifications. [name] agrees to follow up with our clinic in 2 weeks.  Cardiovascular risk counseling [name] was given extended (15 minutes) coronary artery disease prevention counseling today. She is 48 y.o. female and has risk factors for heart disease including obesity and hypertension. We discussed intensive lifestyle modifications today with an emphasis on specific weight loss instructions and strategies. Pt was also informed of the importance of increasing exercise and decreasing saturated fats to help prevent heart disease.  Vitamin D Deficiency [name] was informed that low vitamin D levels contributes to fatigue and are associated with obesity, breast, and colon cancer. [name] agrees to continue taking prescription Vit D 50,000 IU every week #4 and we will refill for 1 month. She will follow up for routine testing of vitamin D, at least 2-3 times per year. She was informed of the risk of over-replacement of vitamin D and agrees to not increase her dose unless she discusses this with [place] first. [name] agrees to follow up with our clinic in 2 weeks.  Obesity [name] is currently in the action stage of change. As such, her goal is to continue with weight loss efforts She has agreed to  follow the Category 3 plan [name] has been instructed to work up to a goal of 150 minutes of combined cardio and strengthening exercise per week for weight loss and overall health benefits. We discussed the following Behavioral Modification Strategies today: increasing lean protein intake, increasing vegetables and work on meal planning and easy cooking plans, keeping healthy foods in the home,  and planning for success [name] agrees to continue taking Topamax 25 mg PO daily #30 and we will refill for 1 month.  [name] has agreed to follow up with our clinic in 2 weeks. She was informed of the importance of frequent follow up visits to maximize her success with intensive lifestyle modifications for her multiple health conditions.  ALLERGIES: No Known Allergies  MEDICATIONS: Current Outpatient Medications on File Prior to Visit  Medication Sig Dispense Refill   Cyanocobalamin (B-12 COMPLIANCE INJECTION) 1000 MCG/ML KIT Inject 1,000 mcg as directed every 30 (thirty) days.     fluconazole (DIFLUCAN) 150 MG tablet Take one dose, repeat after 3 days if needed 2 tablet 0   metFORMIN (GLUCOPHAGE) 500 MG tablet Take 1 tablet (500 mg total) by mouth 2 (two) times daily with a meal. 180 tablet 3   metoprolol tartrate (LOPRESSOR) 25 MG tablet Take 0.5 tablets (12.5 mg total) by mouth 2 (two) times daily. 90 tablet 3   pantoprazole (PROTONIX) 40 MG tablet TAKE 1 TABLET BY MOUTH EVERY DAY 90 tablet 3   PARoxetine (PAXIL) 20 MG tablet Take 1 tablet (20 mg total) by mouth daily. 90 tablet 1   UNABLE TO FIND Med Name: CPAP     valACYclovir (VALTREX) 1000 MG tablet TAKE 2 TABLETS EVERY 12 HOURS FOR 1 DAY START ASAP AFTER SYTPTOM ONSET 12 tablet 11   ALPRAZolam (XANAX) 0.5 MG tablet Take 1 tablet (0.5 mg total) by mouth 3 times/day as needed-between meals & bedtime for anxiety. (Patient not taking: Reported on 01/24/2019) 40 tablet 0   No current facility-administered medications on file prior to visit.     PAST MEDICAL HISTORY: Past Medical History:  Diagnosis Date   Anxiety    ASTHMA    B12 deficiency    DELAYED GASTRIC EMPTYING    DEPRESSION    GERD    Glucose intolerance (impaired glucose tolerance) 04/09/2015   Hypertension    OBESITY    OBSTRUCTIVE SLEEP APNEA    POLYCYSTIC OVARIAN DISEASE    Prediabetes    SMOKER    SOB (shortness of breath)    SVT  (supraventricular tachycardia) ([facility])    Tachycardia    Umbilical hernia    Umbilical hernia    Vitamin B12 deficiency 03/2015 dx   start IM replacement q 30d   Vitamin D deficiency 03/2015 dx    PAST SURGICAL HISTORY: Past Surgical History:  Procedure Laterality Date   CESAREAN SECTION  97 & 98   x's 2   Cocxyl removal  1995   DILATION AND CURETTAGE OF UTERUS     LAPAROSCOPY     TUBAL LIGATION  1998    SOCIAL HISTORY: Social History   Tobacco Use   Smoking status: Former Smoker    Packs/day: 0.25    Years: 30.00    Pack years: 7.50    Types: Cigarettes    Start date: 10/23/1987    Quit date: 10/23/2017    Years since quitting: 1.2   Smokeless tobacco: Never Used  Substance Use Topics   Alcohol use: Yes    Alcohol/week: 0.0 standard drinks  Comment: Rarely   Drug use: No    FAMILY HISTORY: Family History  Problem Relation Age of Onset   Breast cancer Mother        dx 11/2009   Diabetes Mother    High blood pressure Mother    Thyroid disease Mother    Anxiety disorder Mother    Obesity Mother    Diabetes Maternal Aunt    Prostate cancer Maternal Grandfather 54       also hx colon polyps   Heart disease Paternal Grandmother    Pancreatic cancer Paternal Grandmother 34   High blood pressure Father    High Cholesterol Father    Depression Father    Heart murmur Sister    Adrenal disorder Neg Hx     ROS: Review of Systems  Constitutional: Positive for malaise/fatigue. Negative for weight loss.  Cardiovascular: Negative for chest pain.       Negative chest pressure  Gastrointestinal: Negative for nausea and vomiting.  Musculoskeletal:       Negative muscle weakness  Neurological: Negative for headaches.    PHYSICAL EXAM: Blood pressure 128/78, pulse 77, temperature 97.7 F (36.5 C), temperature source Oral, height _0  (1.651 m), weight (!) 357 lb (161.9 kg), SpO2 94 %. Body mass index is 59.41 kg/m. Physical  Exam Vitals signs reviewed.  Constitutional:      Appearance: Normal appearance. She is obese.  Cardiovascular:     Rate and Rhythm: Normal rate.     Pulses: Normal pulses.  Pulmonary:     Effort: Pulmonary effort is normal.     Breath sounds: Normal breath sounds.  Musculoskeletal: Normal range of motion.  Skin:    General: Skin is warm and dry.  Neurological:     Mental Status: She is alert and oriented to person, place, and time.  Psychiatric:        Mood and Affect: Mood normal.        Behavior: Behavior normal.     RECENT LABS AND TESTS: BMET    Component Value Date/Time   NA 139 09/12/2018 0904   NA 139 02/14/2018 1146   K 4.2 09/12/2018 0904   CL 103 09/12/2018 0904   CO2 24 09/12/2018 0904   GLUCOSE 115 (H) 09/12/2018 0904   BUN 12 09/12/2018 0904   BUN 12 02/14/2018 1146   CREATININE 0.97 09/12/2018 0904   CALCIUM 9.0 09/12/2018 0904   GFRNONAA >60 09/12/2018 0904   GFRAA >60 09/12/2018 0904   Lab Results  Component Value Date   HGBA1C 5.8 (H) 09/28/2018   HGBA1C 5.7 (H) 02/14/2018   HGBA1C 6.1 11/24/2017   HGBA1C 5.9 05/19/2017   HGBA1C 5.9 05/25/2016   Lab Results  Component Value Date   INSULIN 2.0 (L) 09/28/2018   INSULIN 19.1 02/14/2018   CBC    Component Value Date/Time   WBC 9.8 09/12/2018 0904   RBC 4.14 09/12/2018 0904   HGB 12.1 09/12/2018 0904   HCT 39.0 09/12/2018 0904   PLT 386 09/12/2018 0904   MCV 94.2 09/12/2018 0904   MCH 29.2 09/12/2018 0904   MCHC 31.0 09/12/2018 0904   RDW 14.0 09/12/2018 0904   LYMPHSABS 2.8 06/01/2018 1554   MONOABS 0.5 06/01/2018 1554   EOSABS 0.2 06/01/2018 1554   BASOSABS 0.1 06/01/2018 1554   Iron/TIBC/Ferritin/ %Sat No results found for: IRON, TIBC, FERRITIN, IRONPCTSAT Lipid Panel     Component Value Date/Time   CHOL 196 09/28/2018 0931   TRIG 148 09/28/2018  0931   HDL 37 (L) 09/28/2018 0931   CHOLHDL 4 11/24/2017 1527   VLDL 23.4 11/24/2017 1527   LDLCALC 129 (H) 09/28/2018 0931    LDLDIRECT 154.1 08/16/2012 0828   Hepatic Function Panel     Component Value Date/Time   PROT 7.8 09/12/2018 0904   PROT 7.2 02/14/2018 1146   ALBUMIN 3.8 09/12/2018 0904   ALBUMIN 4.1 02/14/2018 1146   AST 10 (L) 09/12/2018 0904   ALT 15 09/12/2018 0904   ALKPHOS 83 09/12/2018 0904   BILITOT 0.3 09/12/2018 0904   BILITOT 0.3 02/14/2018 1146   BILIDIR <0.1 05/17/2018 2112   IBILI NOT CALCULATED 05/17/2018 2112      Component Value Date/Time   TSH 2.480 02/14/2018 1146   TSH 2.94 10/27/2017 1353   TSH 1.80 05/19/2017 1011      OBESITY BEHAVIORAL INTERVENTION VISIT  Today's visit was # 18   Starting weight: 368 lbs Starting date: 02/14/18 Today's weight : 357 lbs Today's date: 01/24/2019 Total lbs lost to date: 11    ASK: We discussed the diagnosis of obesity with [name] today and Tessah agreed to give [place] permission to discuss obesity behavioral modification therapy today.  ASSESS: Sausha has the diagnosis of obesity and her BMI today is 59.41 Tasmine is in the action stage of change   ADVISE: Happy was educated on the multiple health risks of obesity as well as the benefit of weight loss to improve her health. She was advised of the need for long term treatment and the importance of lifestyle modifications to improve her current health and to decrease her risk of future health problems.  AGREE: Multiple dietary modification options and treatment options were discussed and  Kizzie agreed to follow the recommendations documented in the above note.  ARRANGE: Alazne was educated on the importance of frequent visits to treat obesity as outlined per CMS and USPSTF guidelines and agreed to schedule her next follow up appointment today.  I, Trixie Dredge, am acting as transcriptionist for Ilene Qua, MD  I have reviewed the above documentation for accuracy and completeness, and I agree with the above. - Ilene Qua, MD

## 2019-02-12 ENCOUNTER — Ambulatory Visit (INDEPENDENT_AMBULATORY_CARE_PROVIDER_SITE_OTHER): Payer: BC Managed Care – PPO | Admitting: Family Medicine

## 2019-02-12 ENCOUNTER — Other Ambulatory Visit: Payer: Self-pay

## 2019-02-12 ENCOUNTER — Encounter (INDEPENDENT_AMBULATORY_CARE_PROVIDER_SITE_OTHER): Payer: Self-pay | Admitting: Family Medicine

## 2019-02-12 DIAGNOSIS — F3289 Other specified depressive episodes: Secondary | ICD-10-CM

## 2019-02-12 DIAGNOSIS — I10 Essential (primary) hypertension: Secondary | ICD-10-CM

## 2019-02-12 DIAGNOSIS — Z6841 Body Mass Index (BMI) 40.0 and over, adult: Secondary | ICD-10-CM

## 2019-02-12 MED ORDER — LOSARTAN POTASSIUM 25 MG PO TABS: 25.0000 mg | ORAL_TABLET | Freq: Every day | ORAL | 0 refills | Status: DC

## 2019-02-12 MED ORDER — TOPIRAMATE 50 MG PO TABS: 50.0000 mg | ORAL_TABLET | Freq: Every day | ORAL | 0 refills | Status: DC

## 2019-02-13 ENCOUNTER — Encounter (INDEPENDENT_AMBULATORY_CARE_PROVIDER_SITE_OTHER): Payer: Self-pay | Admitting: Family Medicine

## 2019-02-13 NOTE — Progress Notes (Signed)
Office: [phone]  /  Fax: [phone] TeleHealth Visit:  [name] has verbally consented to this TeleHealth visit today. The patient is located at work, the provider is located at the [employer] [facility] office. The participants in this visit include the listed provider and patient and any and all parties involved. The visit was conducted today via FaceTime.  HPI:   Chief Complaint: OBESITY [name] is here to discuss her progress with her obesity treatment plan. She is on the Category 3 plan and is following her eating plan approximately 90 % of the time. She states she is walking 15 minutes 2 times per week. [name] feels she has gained weight. She is up to 366 pounds (02/12/19). She does stick to the plan, but then snacks extra. She reports stress eating. [name] is drinking soda and she will go out to buy sweets if there are none in the house. She has increased sweets cravings with her menstrual period. We were unable to weigh the patient today for this TeleHealth visit. She feels as if she has gained weight since her last visit. She has lost 11 lbs since starting treatment with [place].  Hypertension [name] is a 48 y.o. female with hypertension. Genas blood pressure is well controlled on Losartan and Metoprolol. [name] denies chest pain or shortness of breath on exertion. She is working weight loss to help control her blood pressure with the goal of decreasing her risk of heart attack and stroke.  BP Readings from Last 3 Encounters:  01/24/19 128/78  12/25/18 119/73  11/27/18 127/71    Depression with emotional eating behaviors [name] admits to excessive snacking. She is taking Topamax 25 mg daily, but she does not feel that it is helping. She is keeping a journal and she snacks more around the time of her period. [name] struggles with emotional eating and using food for comfort to the extent that it is negatively impacting her health. She often snacks when she is not hungry.  [name] sometimes feels she is out of control and then feels guilty that she made poor food choices. She has been working on behavior modification techniques to help reduce her emotional eating and has been somewhat successful. She shows no sign of suicidal or homicidal ideations.  ASSESSMENT AND PLAN:  Essential hypertension - Plan: losartan (COZAAR) 25 MG tablet  Other depression - with emotional eating - Plan: topiramate (TOPAMAX) 50 MG tablet  Class 3 severe obesity with serious comorbidity and body mass index (BMI) of 50.0 to 59.9 in adult, unspecified obesity type ([place])  PLAN:  Hypertension We discussed sodium restriction, working on healthy weight loss, and a regular exercise program as the means to achieve improved blood pressure control. Anberlin agreed with this plan and agreed to follow up as directed. We will continue to monitor her blood pressure as well as her progress with the above lifestyle modifications. [name] agrees to continue metoprolol and Losartan 25 mg daily #30 with no refills and she will watch for signs of hypotension as she continues her lifestyle modifications.  Depression with Emotional Eating Behaviors We discussed behavior modification techniques today to help Nakyiah deal with her emotional eating and depression. She has agreed to increase the dose of Topamax to 50 mg and refill was written today for Topamax 50 mg q HS #30 with no refills. We discussed weaning off of Paxil in 2021. Her PCP tried to wean her off in the past and she ended up  in the ED with acute anxiety. [name] agreed to follow up as directed.  Obesity Kiylah is currently in the action stage of change. As such, her goal is to continue with weight loss efforts She has agreed to follow the Category 3 plan [name] will continue walking for 15 minutes, 2 times per week for weight loss and overall health benefits. We discussed the following Behavioral Modification Strategies today: planning for success, better snacking  choices, decreasing simple carbohydrates and decrease liquid calories  [name] has agreed to follow up with our clinic in 3 weeks. She was informed of the importance of frequent follow up visits to maximize her success with intensive lifestyle modifications for her multiple health conditions.  ALLERGIES: No Known Allergies  MEDICATIONS: Current Outpatient Medications on File Prior to Visit  Medication Sig Dispense Refill  . ALPRAZolam (XANAX) 0.5 MG tablet Take 1 tablet (0.5 mg total) by mouth 3 times/day as needed-between meals & bedtime for anxiety. (Patient not taking: Reported on 01/24/2019) 40 tablet 0  . Cyanocobalamin (B-12 COMPLIANCE INJECTION) 1000 MCG/ML KIT Inject 1,000 mcg as directed every 30 (thirty) days.    . fluconazole (DIFLUCAN) 150 MG tablet Take one dose, repeat after 3 days if needed 2 tablet 0  . metFORMIN (GLUCOPHAGE) 500 MG tablet Take 1 tablet (500 mg total) by mouth 2 (two) times daily with a meal. 180 tablet 3  . metoprolol tartrate (LOPRESSOR) 25 MG tablet Take 0.5 tablets (12.5 mg total) by mouth 2 (two) times daily. 90 tablet 3  . pantoprazole (PROTONIX) 40 MG tablet TAKE 1 TABLET BY MOUTH EVERY DAY 90 tablet 3  . PARoxetine (PAXIL) 20 MG tablet Take 1 tablet (20 mg total) by mouth daily. 90 tablet 1  . UNABLE TO FIND Med Name: CPAP    . valACYclovir (VALTREX) 1000 MG tablet TAKE 2 TABLETS EVERY 12 HOURS FOR 1 DAY START ASAP AFTER SYTPTOM ONSET 12 tablet 11  . Vitamin D, Ergocalciferol, (DRISDOL) 1.25 MG (50000 UT) CAPS capsule Take 1 capsule by mouth every other week. 6 capsule 0   No current facility-administered medications on file prior to visit.     PAST MEDICAL HISTORY: Past Medical History:  Diagnosis Date  . Anxiety   . ASTHMA   . B12 deficiency   . DELAYED GASTRIC EMPTYING   . DEPRESSION   . GERD   . Glucose intolerance (impaired glucose tolerance) 04/09/2015  . Hypertension   . OBESITY   . OBSTRUCTIVE SLEEP APNEA   . POLYCYSTIC OVARIAN  DISEASE   . Prediabetes   . SMOKER   . SOB (shortness of breath)   . SVT (supraventricular tachycardia) ([place])   . Tachycardia   . Umbilical hernia   . Umbilical hernia   . Vitamin B12 deficiency 03/2015 dx   start IM replacement q 30d  . Vitamin D deficiency 03/2015 dx    PAST SURGICAL HISTORY: Past Surgical History:  Procedure Laterality Date  . CESAREAN SECTION  97 & 98   x's 2  . Cocxyl removal  1995  . DILATION AND CURETTAGE OF UTERUS    . LAPAROSCOPY    . TUBAL LIGATION  1998    SOCIAL HISTORY: Social History   Tobacco Use  . Smoking status: Former Smoker    Packs/day: 0.25    Years: 30.00    Pack years: 7.50    Types: Cigarettes    Start date: 10/23/1987    Quit date: 10/23/2017    Years since quitting: 1.3  .  Smokeless tobacco: Never Used  Substance Use Topics  . Alcohol use: Yes    Alcohol/week: 0.0 standard drinks    Comment: Rarely  . Drug use: No    FAMILY HISTORY: Family History  Problem Relation Age of Onset  . Breast cancer Mother        dx 11/2009  . Diabetes Mother   . High blood pressure Mother   . Thyroid disease Mother   . Anxiety disorder Mother   . Obesity Mother   . Diabetes Maternal Aunt   . Prostate cancer Maternal Grandfather 36       also hx colon polyps  . Heart disease Paternal Grandmother   . Pancreatic cancer Paternal Grandmother 58  . High blood pressure Father   . High Cholesterol Father   . Depression Father   . Heart murmur Sister   . Adrenal disorder Neg Hx     ROS: Review of Systems  Constitutional: Negative for weight loss.  Respiratory: Negative for shortness of breath (on exertion).   Cardiovascular: Negative for chest pain.  Psychiatric/Behavioral: Positive for depression. Negative for suicidal ideas.    PHYSICAL EXAM: Pt in no acute distress  RECENT LABS AND TESTS: BMET    Component Value Date/Time   NA 139 09/12/2018 0904   NA 139 02/14/2018 1146   K 4.2 09/12/2018 0904   CL 103 09/12/2018 0904    CO2 24 09/12/2018 0904   GLUCOSE 115 (H) 09/12/2018 0904   BUN 12 09/12/2018 0904   BUN 12 02/14/2018 1146   CREATININE 0.97 09/12/2018 0904   CALCIUM 9.0 09/12/2018 0904   GFRNONAA >60 09/12/2018 0904   GFRAA >60 09/12/2018 0904   Lab Results  Component Value Date   HGBA1C 5.8 (H) 09/28/2018   HGBA1C 5.7 (H) 02/14/2018   HGBA1C 6.1 11/24/2017   HGBA1C 5.9 05/19/2017   HGBA1C 5.9 05/25/2016   Lab Results  Component Value Date   INSULIN 2.0 (L) 09/28/2018   INSULIN 19.1 02/14/2018   CBC    Component Value Date/Time   WBC 9.8 09/12/2018 0904   RBC 4.14 09/12/2018 0904   HGB 12.1 09/12/2018 0904   HCT 39.0 09/12/2018 0904   PLT 386 09/12/2018 0904   MCV 94.2 09/12/2018 0904   MCH 29.2 09/12/2018 0904   MCHC 31.0 09/12/2018 0904   RDW 14.0 09/12/2018 0904   LYMPHSABS 2.8 06/01/2018 1554   MONOABS 0.5 06/01/2018 1554   EOSABS 0.2 06/01/2018 1554   BASOSABS 0.1 06/01/2018 1554   Iron/TIBC/Ferritin/ %Sat No results found for: IRON, TIBC, FERRITIN, IRONPCTSAT Lipid Panel     Component Value Date/Time   CHOL 196 09/28/2018 0931   TRIG 148 09/28/2018 0931   HDL 37 (L) 09/28/2018 0931   CHOLHDL 4 11/24/2017 1527   VLDL 23.4 11/24/2017 1527   LDLCALC 129 (H) 09/28/2018 0931   LDLDIRECT 154.1 08/16/2012 0828   Hepatic Function Panel     Component Value Date/Time   PROT 7.8 09/12/2018 0904   PROT 7.2 02/14/2018 1146   ALBUMIN 3.8 09/12/2018 0904   ALBUMIN 4.1 02/14/2018 1146   AST 10 (L) 09/12/2018 0904   ALT 15 09/12/2018 0904   ALKPHOS 83 09/12/2018 0904   BILITOT 0.3 09/12/2018 0904   BILITOT 0.3 02/14/2018 1146   BILIDIR <0.1 05/17/2018 2112   IBILI NOT CALCULATED 05/17/2018 2112      Component Value Date/Time   TSH 2.480 02/14/2018 1146   TSH 2.94 10/27/2017 1353   TSH 1.80 05/19/2017 1011  Ref. Range 09/28/2018 09:31  Vitamin D, 25-Hydroxy Latest Ref Range: 30.0 - 100.0 ng/mL 51.9    I, Doreene Nest, am acting as Location manager for [employer], FNP-C  I have reviewed the above documentation for accuracy and completeness, and I agree with the above.  - Dericka Ostenson, FNP-C.

## 2019-02-14 ENCOUNTER — Ambulatory Visit (INDEPENDENT_AMBULATORY_CARE_PROVIDER_SITE_OTHER): Payer: BC Managed Care – PPO

## 2019-02-14 DIAGNOSIS — Z23 Encounter for immunization: Secondary | ICD-10-CM | POA: Diagnosis not present

## 2019-02-14 DIAGNOSIS — E538 Deficiency of other specified B group vitamins: Secondary | ICD-10-CM | POA: Diagnosis not present

## 2019-02-14 MED ORDER — CYANOCOBALAMIN 1000 MCG/ML IJ SOLN
1000.0000 ug | Freq: Once | INTRAMUSCULAR | Status: AC
  Administered 2019-02-14: 1000 ug via INTRAMUSCULAR

## 2019-02-14 NOTE — Progress Notes (Signed)
b12 Injection and flu vaccine given.   Binnie Rail, MD

## 2019-02-15 ENCOUNTER — Other Ambulatory Visit (INDEPENDENT_AMBULATORY_CARE_PROVIDER_SITE_OTHER): Payer: Self-pay | Admitting: Family Medicine

## 2019-02-15 DIAGNOSIS — I10 Essential (primary) hypertension: Secondary | ICD-10-CM

## 2019-02-15 DIAGNOSIS — Z6841 Body Mass Index (BMI) 40.0 and over, adult: Secondary | ICD-10-CM

## 2019-03-11 ENCOUNTER — Ambulatory Visit (INDEPENDENT_AMBULATORY_CARE_PROVIDER_SITE_OTHER): Payer: BC Managed Care – PPO | Admitting: Family Medicine

## 2019-03-12 ENCOUNTER — Other Ambulatory Visit: Payer: Self-pay

## 2019-03-12 ENCOUNTER — Ambulatory Visit (INDEPENDENT_AMBULATORY_CARE_PROVIDER_SITE_OTHER): Payer: BC Managed Care – PPO | Admitting: Family Medicine

## 2019-03-12 ENCOUNTER — Encounter (INDEPENDENT_AMBULATORY_CARE_PROVIDER_SITE_OTHER): Payer: Self-pay | Admitting: Family Medicine

## 2019-03-12 VITALS — BP 134/71 | HR 66 | Temp 97.7°F | Ht 65.0 in | Wt 365.0 lb

## 2019-03-12 DIAGNOSIS — R7303 Prediabetes: Secondary | ICD-10-CM | POA: Diagnosis not present

## 2019-03-12 DIAGNOSIS — Z9189 Other specified personal risk factors, not elsewhere classified: Secondary | ICD-10-CM | POA: Diagnosis not present

## 2019-03-12 DIAGNOSIS — E7849 Other hyperlipidemia: Secondary | ICD-10-CM

## 2019-03-12 DIAGNOSIS — E559 Vitamin D deficiency, unspecified: Secondary | ICD-10-CM

## 2019-03-12 DIAGNOSIS — F3289 Other specified depressive episodes: Secondary | ICD-10-CM

## 2019-03-12 DIAGNOSIS — I10 Essential (primary) hypertension: Secondary | ICD-10-CM | POA: Diagnosis not present

## 2019-03-12 DIAGNOSIS — Z6841 Body Mass Index (BMI) 40.0 and over, adult: Secondary | ICD-10-CM

## 2019-03-12 MED ORDER — LOSARTAN POTASSIUM 25 MG PO TABS: 25.0000 mg | ORAL_TABLET | Freq: Every day | ORAL | 0 refills | Status: DC

## 2019-03-12 NOTE — Progress Notes (Signed)
Office: [phone]  /  Fax: [phone]   HPI:   Chief Complaint: OBESITY [name] is here to discuss her progress with her obesity treatment plan. She is on the Category 3 plan and is following her eating plan approximately 80 % of the time. She states she is exercising 0 minutes 0 times per week. [name] is snacking less and her cravings are better on the increased dose of Topamax. She is not drinking regular soda, but she has had some sweet tea. [name] is on the plan at breakfast and lunch, but she struggles at dinner. They tend to eat out quite a bit for dinner.  Her weight is (!) 365 lb (165.6 kg) today and has had a weight gain of 8 pounds since her last in-office visit. She has lost 3 lbs since starting treatment with [place].  Vitamin D deficiency [name] has a diagnosis of vitamin D deficiency. Her vitamin D level is at goal. [name] is taking prescription vit D every 14 days and she denies nausea, vomiting or muscle weakness.  Hyperlipidemia [name] has hyperlipidemia and she is not on statin. Her last LDL was elevated at 129 (09/28/18).  She denies any chest pain or shortness of breath. The 10-year ASCVD risk score [name] DC [name]., et al., 2013) is: 7.6%   Values used to calculate the score:     Age: 24 years     Sex: Female     Is Non-Hispanic African American: No     Diabetic: No     Tobacco smoker: Yes     Systolic Blood Pressure: 173 mmHg     Is BP treated: Yes     HDL Cholesterol: 37 mg/dL     Total Cholesterol: 196 mg/dL   Pre-Diabetes [name] has a diagnosis of prediabetes based on her elevated Hgb A1c  Her last A1c was at 5.8 on 09/28/18. [name] is taking metformin currently and she continues to work on diet and exercise to decrease risk of diabetes. She denies hypoglycemia.  At risk for diabetes [name] is at higher than average risk for developing diabetes due to her obesity and prediabetes. She currently denies polyuria or polydipsia.  Hypertension [name] is a 48 y.o. female with  hypertension. [name] blood pressure is well controlled on Losartan and Metoprolol. [name] denies chest pain or shortness of breath on exertion. BP Readings from Last 3 Encounters:  03/12/19 134/71  01/24/19 128/78  12/25/18 119/73    Depression with emotional eating behaviors  Topamax does help with cravings. [name] feels that she binge eats. She is tolerating Topamax well. Her mood is somewhat stable. She has been on Paxil for years and does not feel that she gets any benefit from it. She tried to wean off at one point but ended up in ER from withdrawal symptoms so she is anxious about trying to wean off again. She does struggle with a great deal of anxiety.  She has been working on behavior modification techniques to help reduce her emotional eating and has been somewhat successful. She shows no sign of suicidal or homicidal ideations.  ASSESSMENT AND PLAN:  Vitamin D deficiency - Plan: Vitamin D (25 hydroxy)  Other hyperlipidemia - Plan: Lipid Panel With LDL/HDL Ratio  Prediabetes - Plan: Comprehensive Metabolic Panel (CMET), HgB A1c, Insulin, random  Essential hypertension - Plan: losartan (COZAAR) 25 MG tablet  Other depression, emotional eating  At risk for diabetes mellitus  Class 3 severe obesity with serious comorbidity and body  mass index (BMI) of 60.0 to 69.9 in adult, unspecified obesity type ([place])  PLAN:  Vitamin D Deficiency [name] was informed that low vitamin D levels contributes to fatigue and are associated with obesity, breast, and colon cancer. [name] will continue to take prescription Vit D @50 ,000 IU every other week and she will follow up for routine testing of vitamin D, at least 2-3 times per year. She was informed of the risk of over-replacement of vitamin D and agrees to not increase her dose unless she discusses this with [place] first. We will check vitamin D level today and [name] will follow up as directed.  Hyperlipidemia [name] was informed of the American Heart  Association Guidelines emphasizing intensive lifestyle modifications as the first line treatment for hyperlipidemia. We discussed many lifestyle modifications today in depth, and [name] will continue to work on decreasing saturated fats such as fatty red meat, butter and many fried foods. She will also increase vegetables and lean protein in her diet and continue to work on exercise and weight loss efforts. We will check fasting lipid panel today and [name] agrees to follow up with our clinic in 2 weeks.  Pre-Diabetes [name] will continue to work on weight loss, exercise, and decreasing simple carbohydrates in her diet to help decrease the risk of diabetes. . We will check A1c, fasting insulin and glucose today and [name] will follow up with [place] as directed to monitor her progress.  Diabetes risk counseling [name] was given extended (15 minutes) diabetes prevention counseling today. She is 48 y.o. female and has risk factors for diabetes including obesity and prediabetes. We discussed intensive lifestyle modifications today with an emphasis on weight loss as well as increasing exercise and decreasing simple carbohydrates in her diet.  Hypertension We discussed sodium restriction, working on healthy weight loss, and a regular exercise program as the means to achieve improved blood pressure control. [name] agreed with this plan and agreed to follow up as directed. We will continue to monitor her blood pressure as well as her progress with the above lifestyle modifications. [name] agrees to continue Losartan 25 mg daily #30 with no refills and she will watch for signs of hypotension as she continues her lifestyle modifications.  Emotional Eating Behaviors (other depression) She will consider seeing [name]. [name] and we will address again at the next visit. We discussed strategies to decrease emotional eating. We also discussed weaning off of Paxil over the course of a few months and trying something new. I suggested lexapro  to her and she is amenable to this. She would like to do this after the first of the year.   Obesity [name] is currently in the action stage of change. As such, her goal is to continue with weight loss efforts She has agreed to follow the Category 3 plan We discussed the following Behavioral Modification Strategies today: planning for success, better snacking choices, increasing lean protein intake, decreasing simple carbohydrates, decrease eating out, emotional eating strategies and decrease liquid calories  [name] has agreed to follow up with our clinic in 2 weeks. She was informed of the importance of frequent follow up visits to maximize her success with intensive lifestyle modifications for her multiple health conditions.  ALLERGIES: No Known Allergies  MEDICATIONS: Current Outpatient Medications on File Prior to Visit  Medication Sig Dispense Refill  . ALPRAZolam (XANAX) 0.5 MG tablet Take 1 tablet (0.5 mg total) by mouth 3 times/day as needed-between meals & bedtime for anxiety. 40 tablet 0  .  Cyanocobalamin (B-12 COMPLIANCE INJECTION) 1000 MCG/ML KIT Inject 1,000 mcg as directed every 30 (thirty) days.    . fluconazole (DIFLUCAN) 150 MG tablet Take one dose, repeat after 3 days if needed 2 tablet 0  . metFORMIN (GLUCOPHAGE) 500 MG tablet Take 1 tablet (500 mg total) by mouth 2 (two) times daily with a meal. 180 tablet 3  . metoprolol tartrate (LOPRESSOR) 25 MG tablet Take 0.5 tablets (12.5 mg total) by mouth 2 (two) times daily. 90 tablet 3  . pantoprazole (PROTONIX) 40 MG tablet TAKE 1 TABLET BY MOUTH EVERY DAY 90 tablet 3  . PARoxetine (PAXIL) 20 MG tablet Take 1 tablet (20 mg total) by mouth daily. 90 tablet 1  . topiramate (TOPAMAX) 50 MG tablet Take 1 tablet (50 mg total) by mouth daily. 30 tablet 0  . UNABLE TO FIND Med Name: CPAP    . valACYclovir (VALTREX) 1000 MG tablet TAKE 2 TABLETS EVERY 12 HOURS FOR 1 DAY START ASAP AFTER SYTPTOM ONSET 12 tablet 11  . Vitamin D,  Ergocalciferol, (DRISDOL) 1.25 MG (50000 UT) CAPS capsule Take 1 capsule by mouth every other week. 6 capsule 0   No current facility-administered medications on file prior to visit.     PAST MEDICAL HISTORY: Past Medical History:  Diagnosis Date  . Anxiety   . ASTHMA   . B12 deficiency   . DELAYED GASTRIC EMPTYING   . DEPRESSION   . GERD   . Glucose intolerance (impaired glucose tolerance) 04/09/2015  . Hypertension   . OBESITY   . OBSTRUCTIVE SLEEP APNEA   . POLYCYSTIC OVARIAN DISEASE   . Prediabetes   . SMOKER   . SOB (shortness of breath)   . SVT (supraventricular tachycardia) ([place])   . Tachycardia   . Umbilical hernia   . Umbilical hernia   . Vitamin B12 deficiency 03/2015 dx   start IM replacement q 30d  . Vitamin D deficiency 03/2015 dx    PAST SURGICAL HISTORY: Past Surgical History:  Procedure Laterality Date  . CESAREAN SECTION  97 & 98   x's 2  . Cocxyl removal  1995  . DILATION AND CURETTAGE OF UTERUS    . LAPAROSCOPY    . TUBAL LIGATION  1998    SOCIAL HISTORY: Social History   Tobacco Use  . Smoking status: Former Smoker    Packs/day: 0.25    Years: 30.00    Pack years: 7.50    Types: Cigarettes    Start date: 10/23/1987    Quit date: 10/23/2017    Years since quitting: 1.3  . Smokeless tobacco: Never Used  Substance Use Topics  . Alcohol use: Yes    Alcohol/week: 0.0 standard drinks    Comment: Rarely  . Drug use: No    FAMILY HISTORY: Family History  Problem Relation Age of Onset  . Breast cancer Mother        dx 11/2009  . Diabetes Mother   . High blood pressure Mother   . Thyroid disease Mother   . Anxiety disorder Mother   . Obesity Mother   . Diabetes Maternal Aunt   . Prostate cancer Maternal Grandfather 56       also hx colon polyps  . Heart disease Paternal Grandmother   . Pancreatic cancer Paternal Grandmother 28  . High blood pressure Father   . High Cholesterol Father   . Depression Father   . Heart murmur Sister    . Adrenal disorder Neg Hx  ROS: Review of Systems  Constitutional: Negative for weight loss.  Respiratory: Negative for shortness of breath (on exertion).   Cardiovascular: Negative for chest pain.  Gastrointestinal: Negative for nausea and vomiting.  Genitourinary: Negative for frequency.  Musculoskeletal:       Negative for muscle weakness  Endo/Heme/Allergies: Negative for polydipsia.       Negative for hypoglycemia Positive for cravings  Psychiatric/Behavioral: Positive for depression. Negative for suicidal ideas.    PHYSICAL EXAM: Blood pressure 134/71, pulse 66, temperature 97.7 F (36.5 C), temperature source Oral, height 5' 5"  (1.651 m), weight (!) 365 lb (165.6 kg), last menstrual period 02/10/2019, SpO2 98 %. Body mass index is 60.74 kg/m. Physical Exam Vitals signs reviewed.  Constitutional:      Appearance: Normal appearance. She is well-developed. She is obese.  Cardiovascular:     Rate and Rhythm: Normal rate.  Pulmonary:     Effort: Pulmonary effort is normal.  Musculoskeletal: Normal range of motion.  Skin:    General: Skin is warm and dry.  Neurological:     Mental Status: She is alert and oriented to person, place, and time.  Psychiatric:        Mood and Affect: Mood normal.        Behavior: Behavior normal.        Thought Content: Thought content does not include homicidal or suicidal ideation.     RECENT LABS AND TESTS: BMET    Component Value Date/Time   NA 139 09/12/2018 0904   NA 139 02/14/2018 1146   K 4.2 09/12/2018 0904   CL 103 09/12/2018 0904   CO2 24 09/12/2018 0904   GLUCOSE 115 (H) 09/12/2018 0904   BUN 12 09/12/2018 0904   BUN 12 02/14/2018 1146   CREATININE 0.97 09/12/2018 0904   CALCIUM 9.0 09/12/2018 0904   GFRNONAA >60 09/12/2018 0904   GFRAA >60 09/12/2018 0904   Lab Results  Component Value Date   HGBA1C 5.8 (H) 09/28/2018   HGBA1C 5.7 (H) 02/14/2018   HGBA1C 6.1 11/24/2017   HGBA1C 5.9 05/19/2017   HGBA1C 5.9  05/25/2016   Lab Results  Component Value Date   INSULIN 2.0 (L) 09/28/2018   INSULIN 19.1 02/14/2018   CBC    Component Value Date/Time   WBC 9.8 09/12/2018 0904   RBC 4.14 09/12/2018 0904   HGB 12.1 09/12/2018 0904   HCT 39.0 09/12/2018 0904   PLT 386 09/12/2018 0904   MCV 94.2 09/12/2018 0904   MCH 29.2 09/12/2018 0904   MCHC 31.0 09/12/2018 0904   RDW 14.0 09/12/2018 0904   LYMPHSABS 2.8 06/01/2018 1554   MONOABS 0.5 06/01/2018 1554   EOSABS 0.2 06/01/2018 1554   BASOSABS 0.1 06/01/2018 1554   Iron/TIBC/Ferritin/ %Sat No results found for: IRON, TIBC, FERRITIN, IRONPCTSAT Lipid Panel     Component Value Date/Time   CHOL 196 09/28/2018 0931   TRIG 148 09/28/2018 0931   HDL 37 (L) 09/28/2018 0931   CHOLHDL 4 11/24/2017 1527   VLDL 23.4 11/24/2017 1527   LDLCALC 129 (H) 09/28/2018 0931   LDLDIRECT 154.1 08/16/2012 0828   Hepatic Function Panel     Component Value Date/Time   PROT 7.8 09/12/2018 0904   PROT 7.2 02/14/2018 1146   ALBUMIN 3.8 09/12/2018 0904   ALBUMIN 4.1 02/14/2018 1146   AST 10 (L) 09/12/2018 0904   ALT 15 09/12/2018 0904   ALKPHOS 83 09/12/2018 0904   BILITOT 0.3 09/12/2018 0904   BILITOT 0.3 02/14/2018 1146  BILIDIR <0.1 05/17/2018 2112   IBILI NOT CALCULATED 05/17/2018 2112      Component Value Date/Time   TSH 2.480 02/14/2018 1146   TSH 2.94 10/27/2017 1353   TSH 1.80 05/19/2017 1011     Ref. Range 09/28/2018 09:31  Vitamin D, 25-Hydroxy Latest Ref Range: 30.0 - 100.0 ng/mL 51.9    OBESITY BEHAVIORAL INTERVENTION VISIT  Today's visit was # 20   Starting weight: 368 lbs Starting date: 02/14/2018 Today's weight : 365 lbs Today's date: 03/12/2019 Total lbs lost to date: 3    03/12/2019  Height 5' 5"  (1.651 m)  Weight 365 lb (165.6 kg) (A)  BMI (Calculated) 60.74  BLOOD PRESSURE - SYSTOLIC 951  BLOOD PRESSURE - DIASTOLIC 71   Body Fat % 88.4 %    ASK: We discussed the diagnosis of obesity with [name] today and Makaylen  agreed to give [place] permission to discuss obesity behavioral modification therapy today.  ASSESS: [name] has the diagnosis of obesity and her BMI today is 60.74 Ryla is in the action stage of change   ADVISE: Octavia was educated on the multiple health risks of obesity as well as the benefit of weight loss to improve her health. She was advised of the need for long term treatment and the importance of lifestyle modifications to improve her current health and to decrease her risk of future health problems.  AGREE: Multiple dietary modification options and treatment options were discussed and  Malayna agreed to follow the recommendations documented in the above note.  ARRANGE: Shannia was educated on the importance of frequent visits to treat obesity as outlined per CMS and USPSTF guidelines and agreed to schedule her next follow up appointment today.  I, Doreene Nest, am acting as transcriptionist for [employer], FNP-C  I have reviewed the above documentation for accuracy and completeness, and I agree with the above.  - Hosie Sharman, FNP-C.

## 2019-03-13 ENCOUNTER — Other Ambulatory Visit (INDEPENDENT_AMBULATORY_CARE_PROVIDER_SITE_OTHER): Payer: Self-pay | Admitting: Family Medicine

## 2019-03-13 ENCOUNTER — Encounter (INDEPENDENT_AMBULATORY_CARE_PROVIDER_SITE_OTHER): Payer: Self-pay | Admitting: Family Medicine

## 2019-03-13 DIAGNOSIS — I10 Essential (primary) hypertension: Secondary | ICD-10-CM

## 2019-03-13 DIAGNOSIS — E7849 Other hyperlipidemia: Secondary | ICD-10-CM | POA: Insufficient documentation

## 2019-03-13 DIAGNOSIS — F3289 Other specified depressive episodes: Secondary | ICD-10-CM

## 2019-03-13 LAB — VITAMIN D 25 HYDROXY (VIT D DEFICIENCY, FRACTURES): Vit D, 25-Hydroxy: 28.6 ng/mL — ABNORMAL LOW (ref 30.0–100.0)

## 2019-03-13 LAB — COMPREHENSIVE METABOLIC PANEL
ALT: 11 IU/L (ref 0–32)
AST: 8 IU/L (ref 0–40)
Albumin/Globulin Ratio: 1.2 (ref 1.2–2.2)
Albumin: 3.8 g/dL (ref 3.8–4.8)
Alkaline Phosphatase: 93 IU/L (ref 39–117)
BUN/Creatinine Ratio: 15 (ref 9–23)
BUN: 14 mg/dL (ref 6–24)
Bilirubin Total: 0.2 mg/dL (ref 0.0–1.2)
CO2: 21 mmol/L (ref 20–29)
Calcium: 8.9 mg/dL (ref 8.7–10.2)
Chloride: 107 mmol/L — ABNORMAL HIGH (ref 96–106)
Creatinine, Ser: 0.92 mg/dL (ref 0.57–1.00)
GFR calc Af Amer: 86 mL/min/{1.73_m2} (ref >59)
GFR calc non Af Amer: 74 mL/min/{1.73_m2} (ref >59)
Globulin, Total: 3.3 g/dL (ref 1.5–4.5)
Glucose: 107 mg/dL — ABNORMAL HIGH (ref 65–99)
Potassium: 4.7 mmol/L (ref 3.5–5.2)
Sodium: 140 mmol/L (ref 134–144)
Total Protein: 7.1 g/dL (ref 6.0–8.5)

## 2019-03-13 LAB — SPECIMEN STATUS REPORT

## 2019-03-13 LAB — HEMOGLOBIN A1C
Est. average glucose Bld gHb Est-mCnc: 120 mg/dL
Hgb A1c MFr Bld: 5.8 % — ABNORMAL HIGH (ref 4.8–5.6)

## 2019-03-13 LAB — LIPID PANEL WITH LDL/HDL RATIO
Cholesterol, Total: 189 mg/dL (ref 100–199)
HDL: 42 mg/dL (ref >39)
LDL Chol Calc (NIH): 127 mg/dL — ABNORMAL HIGH (ref 0–99)
LDL/HDL Ratio: 3 ratio (ref 0.0–3.2)
Triglycerides: 108 mg/dL (ref 0–149)
VLDL Cholesterol Cal: 20 mg/dL (ref 5–40)

## 2019-03-13 LAB — INSULIN, RANDOM: INSULIN: 27.7 u[IU]/mL — ABNORMAL HIGH (ref 2.6–24.9)

## 2019-03-15 ENCOUNTER — Ambulatory Visit (INDEPENDENT_AMBULATORY_CARE_PROVIDER_SITE_OTHER): Payer: BC Managed Care – PPO

## 2019-03-15 DIAGNOSIS — E538 Deficiency of other specified B group vitamins: Secondary | ICD-10-CM

## 2019-03-15 MED ORDER — CYANOCOBALAMIN 1000 MCG/ML IJ SOLN
1000.0000 ug | Freq: Once | INTRAMUSCULAR | Status: AC
  Administered 2019-03-15: 1000 ug via INTRAMUSCULAR

## 2019-03-15 NOTE — Progress Notes (Signed)
b12 Injection given.   Binnie Rail, MD

## 2019-03-19 ENCOUNTER — Encounter (INDEPENDENT_AMBULATORY_CARE_PROVIDER_SITE_OTHER): Payer: Self-pay | Admitting: Family Medicine

## 2019-03-19 ENCOUNTER — Telehealth (INDEPENDENT_AMBULATORY_CARE_PROVIDER_SITE_OTHER): Payer: Self-pay | Admitting: Family Medicine

## 2019-03-19 NOTE — Telephone Encounter (Signed)
Hi, [name], Your results should release to you within a day or two but here is a summary. Fasting glucose is improved. LDL (bad cholesterol), triglycerides, and HDL (good cholesterol) are improved.  Vitamin D is lower and we will need to put you back on vitamin D weekly at your next visit.  A1c is the same at 5.8. Overall labs were good! See you soon! Rudolpho Claxton

## 2019-03-19 NOTE — Telephone Encounter (Signed)
Please advise the patient to send a my chart message. Maalik Pinn, [place]

## 2019-03-19 NOTE — Telephone Encounter (Signed)
Please advise about lab results

## 2019-03-19 NOTE — Telephone Encounter (Signed)
Would you like for the patient to start checking her glucose at home?

## 2019-03-22 ENCOUNTER — Ambulatory Visit: Payer: BC Managed Care – PPO

## 2019-03-26 ENCOUNTER — Telehealth (INDEPENDENT_AMBULATORY_CARE_PROVIDER_SITE_OTHER): Payer: BC Managed Care – PPO | Admitting: Family Medicine

## 2019-03-26 ENCOUNTER — Encounter (INDEPENDENT_AMBULATORY_CARE_PROVIDER_SITE_OTHER): Payer: Self-pay | Admitting: Family Medicine

## 2019-03-26 ENCOUNTER — Other Ambulatory Visit: Payer: Self-pay

## 2019-03-26 DIAGNOSIS — E559 Vitamin D deficiency, unspecified: Secondary | ICD-10-CM | POA: Diagnosis not present

## 2019-03-26 DIAGNOSIS — I10 Essential (primary) hypertension: Secondary | ICD-10-CM | POA: Diagnosis not present

## 2019-03-26 DIAGNOSIS — F3289 Other specified depressive episodes: Secondary | ICD-10-CM

## 2019-03-26 DIAGNOSIS — Z6841 Body Mass Index (BMI) 40.0 and over, adult: Secondary | ICD-10-CM

## 2019-03-26 MED ORDER — TOPIRAMATE 50 MG PO TABS: 50.0000 mg | ORAL_TABLET | Freq: Every day | ORAL | 1 refills | Status: DC

## 2019-03-26 MED ORDER — VITAMIN D (ERGOCALCIFEROL) 1.25 MG (50000 UNIT) PO CAPS: ORAL_CAPSULE | ORAL | 0 refills | Status: DC

## 2019-03-26 MED ORDER — LOSARTAN POTASSIUM 25 MG PO TABS: 25.0000 mg | ORAL_TABLET | Freq: Every day | ORAL | 0 refills | Status: DC

## 2019-03-28 NOTE — Progress Notes (Signed)
Office: [phone]  /  Fax: [phone] TeleHealth Visit:  [name] has verbally consented to this TeleHealth visit today. The patient is located at work, the provider is located at the [employer] [facility] office. The participants in this visit include the listed provider and patient and any and all parties involved. The visit was conducted today via FaceTime.  HPI:  Chief Complaint: OBESITY [name] is here to discuss her progress with her obesity treatment plan. She is on the Category 3 plan and states she is following her eating plan approximately 75 % of the time. She states she is exercising 0 minutes 0 times per week.  [name] has mostly cut out sweet tea and soda. She is doing well at breakfast and lunch. She has been working on decreasing unhealthy snacks. [name] admits to having [employer] a few times.  Vitamin D deficiency [name] has a diagnosis of vitamin D deficiency. Her vitamin D level was not maintained on 50,000 IU every two weeks. Her vitamin D level decreased from 51.9 to 28.6. [name] admits fatigue.   Hypertension [name] is a 48 y.o. female with hypertension. She denies chest pain or shortness of breath. Her blood pressure is well controlled.      BP Readings from Last 3 Encounters:  03/12/19 134/71  01/24/19 128/78  12/25/18 119/73     Depression with emotional eating behaviors [name] struggles with emotional eating and she reports that Topamax decreases appetite and cravings. [name] admits dysgeusia when she drinks carbonated beverages.   ASSESSMENT AND PLAN:  Vitamin D deficiency - Plan: Vitamin D, Ergocalciferol, (DRISDOL) 1.25 MG (50000 UT) CAPS capsule  Essential hypertension - Plan: losartan (COZAAR) 25 MG tablet  Other depression, emotional eating  - Plan: topiramate (TOPAMAX) 50 MG tablet  Class 3 severe obesity with serious comorbidity and body mass index (BMI) of 60.0 to 69.9 in adult, unspecified obesity type ([place])  PLAN:  Vitamin D  Deficiency Low vitamin D level contributes to fatigue and are associated with obesity, breast, and colon cancer. [name] agrees to increase the dose of prescription Vit D 50,000 IU to weekly #4 with no refills and she will follow up for routine testing of vitamin D, at least 2-3 times per year to avoid over-replacement. [name] agrees to follow up with our clinic in 3 weeks.  Hypertension [name] is working on healthy weight loss and exercise to improve blood pressure control. We will watch for signs of hypotension as she continues her lifestyle modifications. [name] agrees to continue Losartan 25 mg daily #30 with no refills and follow up as directed.  Emotional Eating Behaviors (other depression) Behavior modification techniques were discussed today to help [name] deal with her emotional/non-hunger eating behaviors. [name] agrees to continue Topamax 50 mg daily #30 with 1 refill and follow up as directed. We will continue to follow and monitor her progress.  Obesity [name] is currently in the action stage of change. As such, her goal is to continue with weight loss efforts She has agreed to follow the Category 3 plan We discussed the following Behavioral Modification Strategies today: planning for success, increasing lean protein intake, decreasing simple carbohydrates  and decrease liquid calories  [name] will work to decrease sugary beverages.  [name] has agreed to follow up with our clinic in 3 weeks. She was informed of the importance of frequent follow up visits to maximize her success with intensive lifestyle modifications for her multiple health conditions.  ALLERGIES: No Known Allergies  MEDICATIONS:  Current Outpatient Medications on File Prior to Visit  Medication Sig Dispense Refill  . ALPRAZolam (XANAX) 0.5 MG tablet Take 1 tablet (0.5 mg total) by mouth 3 times/day as needed-between meals & bedtime for anxiety. 40 tablet 0  . Cyanocobalamin (B-12 COMPLIANCE INJECTION) 1000 MCG/ML KIT Inject 1,000  mcg as directed every 30 (thirty) days.    . fluconazole (DIFLUCAN) 150 MG tablet Take one dose, repeat after 3 days if needed 2 tablet 0  . metFORMIN (GLUCOPHAGE) 500 MG tablet Take 1 tablet (500 mg total) by mouth 2 (two) times daily with a meal. 180 tablet 3  . metoprolol tartrate (LOPRESSOR) 25 MG tablet Take 0.5 tablets (12.5 mg total) by mouth 2 (two) times daily. 90 tablet 3  . pantoprazole (PROTONIX) 40 MG tablet TAKE 1 TABLET BY MOUTH EVERY DAY 90 tablet 3  . PARoxetine (PAXIL) 20 MG tablet Take 1 tablet (20 mg total) by mouth daily. 90 tablet 1  . UNABLE TO FIND Med Name: CPAP    . valACYclovir (VALTREX) 1000 MG tablet TAKE 2 TABLETS EVERY 12 HOURS FOR 1 DAY START ASAP AFTER SYTPTOM ONSET 12 tablet 11   No current facility-administered medications on file prior to visit.    PAST MEDICAL HISTORY: Past Medical History:  Diagnosis Date  . Anxiety   . ASTHMA   . B12 deficiency   . DELAYED GASTRIC EMPTYING   . DEPRESSION   . GERD   . Glucose intolerance (impaired glucose tolerance) 04/09/2015  . Hypertension   . OBESITY   . OBSTRUCTIVE SLEEP APNEA   . POLYCYSTIC OVARIAN DISEASE   . Prediabetes   . SMOKER   . SOB (shortness of breath)   . SVT (supraventricular tachycardia) ([name])   . Tachycardia   . Umbilical hernia   . Umbilical hernia   . Vitamin B12 deficiency 03/2015 dx   start IM replacement q 30d  . Vitamin D deficiency 03/2015 dx    PAST SURGICAL HISTORY: Past Surgical History:  Procedure Laterality Date  . CESAREAN SECTION  97 & 98   x's 2  . Cocxyl removal  1995  . DILATION AND CURETTAGE OF UTERUS    . LAPAROSCOPY    . TUBAL LIGATION  1998    SOCIAL HISTORY: Social History   Tobacco Use  . Smoking status: Former Smoker    Packs/day: 0.25    Years: 30.00    Pack years: 7.50    Types: Cigarettes    Start date: 10/23/1987    Quit date: 10/23/2017    Years since quitting: 1.4  . Smokeless tobacco: Never Used  Substance Use Topics  . Alcohol use:  Yes    Alcohol/week: 0.0 standard drinks    Comment: Rarely  . Drug use: No    FAMILY HISTORY: Family History  Problem Relation Age of Onset  . Breast cancer Mother        dx 11/2009  . Diabetes Mother   . High blood pressure Mother   . Thyroid disease Mother   . Anxiety disorder Mother   . Obesity Mother   . Diabetes Maternal Aunt   . Prostate cancer Maternal Grandfather 31       also hx colon polyps  . Heart disease Paternal Grandmother   . Pancreatic cancer Paternal Grandmother 45  . High blood pressure Father   . High Cholesterol Father   . Depression Father   . Heart murmur Sister   . Adrenal disorder Neg Hx  ROS: Review of Systems  Constitutional: Positive for malaise/fatigue. Negative for weight loss.  HENT:       Positive for dysgeusia  Endo/Heme/Allergies:       Positive for cravings  Psychiatric/Behavioral: Positive for depression.    PHYSICAL EXAM: There were no vitals taken for this visit. There is no height or weight on file to calculate BMI. Physical Exam Vitals reviewed.  Constitutional:      General: She is not in acute distress.    Appearance: Normal appearance. She is well-developed. She is obese.  Cardiovascular:     Rate and Rhythm: Normal rate.  Pulmonary:     Effort: Pulmonary effort is normal.  Musculoskeletal:        General: Normal range of motion.  Skin:    General: Skin is warm and dry.  Neurological:     Mental Status: She is alert and oriented to person, place, and time.  Psychiatric:        Mood and Affect: Mood normal.        Behavior: Behavior normal.     RECENT LABS AND TESTS: BMET    Component Value Date/Time   NA 140 03/12/2019 0000   K 4.7 03/12/2019 0000   CL 107 (H) 03/12/2019 0000   CO2 21 03/12/2019 0000   GLUCOSE 107 (H) 03/12/2019 0000   GLUCOSE 115 (H) 09/12/2018 0904   BUN 14 03/12/2019 0000   CREATININE 0.92 03/12/2019 0000   CALCIUM 8.9 03/12/2019 0000   GFRNONAA 74 03/12/2019 0000   GFRAA 86  03/12/2019 0000   Lab Results  Component Value Date   HGBA1C 5.8 (H) 03/12/2019   HGBA1C 5.8 (H) 09/28/2018   HGBA1C 5.7 (H) 02/14/2018   HGBA1C 6.1 11/24/2017   HGBA1C 5.9 05/19/2017   Lab Results  Component Value Date   INSULIN 27.7 (H) 03/12/2019   INSULIN 2.0 (L) 09/28/2018   INSULIN 19.1 02/14/2018   CBC    Component Value Date/Time   WBC 9.8 09/12/2018 0904   RBC 4.14 09/12/2018 0904   HGB 12.1 09/12/2018 0904   HCT 39.0 09/12/2018 0904   PLT 386 09/12/2018 0904   MCV 94.2 09/12/2018 0904   MCH 29.2 09/12/2018 0904   MCHC 31.0 09/12/2018 0904   RDW 14.0 09/12/2018 0904   LYMPHSABS 2.8 06/01/2018 1554   MONOABS 0.5 06/01/2018 1554   EOSABS 0.2 06/01/2018 1554   BASOSABS 0.1 06/01/2018 1554   Iron/TIBC/Ferritin/ %Sat No results found for: IRON, TIBC, FERRITIN, IRONPCTSAT Lipid Panel     Component Value Date/Time   CHOL 189 03/12/2019 0000   TRIG 108 03/12/2019 0000   HDL 42 03/12/2019 0000   CHOLHDL 4 11/24/2017 1527   VLDL 23.4 11/24/2017 1527   LDLCALC 127 (H) 03/12/2019 0000   LDLDIRECT 154.1 08/16/2012 0828   Hepatic Function Panel     Component Value Date/Time   PROT 7.1 03/12/2019 0000   ALBUMIN 3.8 03/12/2019 0000   AST 8 03/12/2019 0000   ALT 11 03/12/2019 0000   ALKPHOS 93 03/12/2019 0000   BILITOT 0.2 03/12/2019 0000   BILIDIR <0.1 05/17/2018 2112   IBILI NOT CALCULATED 05/17/2018 2112      Component Value Date/Time   TSH 2.480 02/14/2018 1146   TSH 2.94 10/27/2017 1353   TSH 1.80 05/19/2017 1011     Ref. Range 03/12/2019 00:00  Vitamin D, 25-Hydroxy Latest Ref Range: 30.0 - 100.0 ng/mL 28.6 (L)   I, Doreene Nest, am acting as Location manager for [employer],  FNP-C.  I have reviewed the above documentation for accuracy and completeness, and I agree with the above.  - Andersen Mckiver, FNP-C.

## 2019-04-17 ENCOUNTER — Ambulatory Visit (INDEPENDENT_AMBULATORY_CARE_PROVIDER_SITE_OTHER): Payer: BC Managed Care – PPO | Admitting: Physician Assistant

## 2019-04-17 ENCOUNTER — Other Ambulatory Visit: Payer: Self-pay

## 2019-04-17 ENCOUNTER — Encounter (INDEPENDENT_AMBULATORY_CARE_PROVIDER_SITE_OTHER): Payer: Self-pay | Admitting: Physician Assistant

## 2019-04-17 DIAGNOSIS — I10 Essential (primary) hypertension: Secondary | ICD-10-CM | POA: Diagnosis not present

## 2019-04-17 DIAGNOSIS — Z6841 Body Mass Index (BMI) 40.0 and over, adult: Secondary | ICD-10-CM | POA: Diagnosis not present

## 2019-04-17 DIAGNOSIS — E559 Vitamin D deficiency, unspecified: Secondary | ICD-10-CM | POA: Diagnosis not present

## 2019-04-17 MED ORDER — VITAMIN D (ERGOCALCIFEROL) 1.25 MG (50000 UNIT) PO CAPS: ORAL_CAPSULE | ORAL | 0 refills | Status: DC

## 2019-04-17 MED ORDER — LOSARTAN POTASSIUM 25 MG PO TABS: 25.0000 mg | ORAL_TABLET | Freq: Every day | ORAL | 0 refills | Status: DC

## 2019-04-19 ENCOUNTER — Other Ambulatory Visit: Payer: Self-pay

## 2019-04-19 ENCOUNTER — Ambulatory Visit (INDEPENDENT_AMBULATORY_CARE_PROVIDER_SITE_OTHER): Payer: BC Managed Care – PPO

## 2019-04-19 DIAGNOSIS — E538 Deficiency of other specified B group vitamins: Secondary | ICD-10-CM | POA: Diagnosis not present

## 2019-04-19 MED ORDER — CYANOCOBALAMIN 1000 MCG/ML IJ SOLN
1000.0000 ug | Freq: Once | INTRAMUSCULAR | Status: AC
  Administered 2019-04-19: 1000 ug via INTRAMUSCULAR

## 2019-04-19 NOTE — Progress Notes (Signed)
b12 Injection given.   Binnie Rail, MD

## 2019-04-23 ENCOUNTER — Encounter (INDEPENDENT_AMBULATORY_CARE_PROVIDER_SITE_OTHER): Payer: Self-pay | Admitting: Family Medicine

## 2019-04-23 NOTE — Progress Notes (Signed)
TeleHealth Visit:  Due to the COVID-19 pandemic, this visit was completed with telemedicine (audio/video) technology to reduce patient and provider exposure as well as to preserve personal protective equipment.   [name] has verbally consented to this TeleHealth visit. The patient is located at home, the provider is located at the [employer] [facility] office. The participants in this visit include the listed provider and patient and any and all parties involved. The visit was conducted today via FaceTime.  Chief Complaint: [name] is here to discuss her progress with her Lorraine treatment plan along with follow-up of her Lorraine related diagnoses. [name] is on the Category 3 Plan and states she is following her eating plan approximately 0% of the time. [name] states she is exercising 0 minutes 0 times per week. (weight not reported)  Today's visit was #: 22 Starting weight: 368 lbs Starting date: 02/14/2018  Interim History: [name] reports that she has not been following the plan, due to stress with her son and daughter. She started drinking sweet tea again, but states she is going back to adding sugar free packets to her water. She plans to do some meal planning this weekend, and get back on track.  Subjective:   Vitamin D deficiency  [name] is on vitamin D and she has no nausea, vomiting or muscle weakness. Her last vitamin D level was at 28.6 (03/12/19).  Essential hypertension  [name] is on Losartan and Metoprolol. She has no chest pain or headache.  Assessment/Plan:   Vitamin D deficiency  Low Vitamin D level contributes to fatigue and are associated with Lorraine, breast, and colon cancer. [name] agrees to continue to take prescription Vitamin D @50 ,000 IU every week #4 with no refills and she will follow-up for routine testing of vitamin D, at least 2-3 times per year to avoid over-replacement.  Essential hypertension [name] agrees to continue Losartan 25 mg daily #30  with no refills and we will monitor.  Lorraine [name] is currently in the action stage of change. As such, her goal is to continue with weight loss efforts. She has agreed to on the Category 3 Plan.   We discussed the following exercise goals today: For substantial health benefits, adults should do at least 150 minutes (2 hours and 30 minutes) a week of moderate-intensity, or 75 minutes (1 hour and 15 minutes) a week of vigorous-intensity aerobic physical activity, or an equivalent combination of moderate- and vigorous-intensity aerobic activity. Aerobic activity should be performed in episodes of at least 10 minutes, and preferably, it should be spread throughout the week. Adults should also include muscle-strengthening activities that involve all major muscle groups on 2 or more days a week.  We discussed the following behavioral modification strategies today: meal planning and cooking strategies and keeping healthy foods in the home.  ABBAGALE [name] has agreed to follow-up with our clinic in 2 weeks. She was informed of the importance of frequent follow-up visits to maximize her success with intensive lifestyle modifications for her multiple health conditions.  Objective:   VITALS: Per patient if applicable, see vitals. GENERAL: Alert and in no acute distress. CARDIOPULMONARY: No increased WOB. Speaking in clear sentences.  PSYCH: Pleasant and cooperative. Speech normal rate and rhythm. Affect is appropriate. Insight and judgement are appropriate. Attention is focused, linear, and appropriate.  NEURO: Oriented as arrived to appointment on time with no prompting.   Lab Results  Component Value Date   CREATININE 0.92 03/12/2019  BUN 14 03/12/2019   NA 140 03/12/2019   K 4.7 03/12/2019   CL 107 (H) 03/12/2019   CO2 21 03/12/2019   Lab Results  Component Value Date   ALT 11 03/12/2019   AST 8 03/12/2019   ALKPHOS 93 03/12/2019   BILITOT 0.2 03/12/2019   Lab Results  Component Value  Date   HGBA1C 5.8 (H) 03/12/2019   HGBA1C 5.8 (H) 09/28/2018   HGBA1C 5.7 (H) 02/14/2018   HGBA1C 6.1 11/24/2017   HGBA1C 5.9 05/19/2017   Lab Results  Component Value Date   INSULIN 27.7 (H) 03/12/2019   INSULIN 2.0 (L) 09/28/2018   INSULIN 19.1 02/14/2018   Lab Results  Component Value Date   TSH 2.480 02/14/2018   Lab Results  Component Value Date   CHOL 189 03/12/2019   HDL 42 03/12/2019   LDLCALC 127 (H) 03/12/2019   LDLDIRECT 154.1 08/16/2012   TRIG 108 03/12/2019   CHOLHDL 4 11/24/2017   Lab Results  Component Value Date   WBC 9.8 09/12/2018   HGB 12.1 09/12/2018   HCT 39.0 09/12/2018   MCV 94.2 09/12/2018   PLT 386 09/12/2018   No results found for: IRON, TIBC, FERRITIN   Ref. Range 03/12/2019 00:00  Vitamin D, 25-Hydroxy Latest Ref Range: 30.0 - 100.0 ng/mL 28.6 (L)   Attestation Statements:   Reviewed by clinician on day of visit: allergies, medications, problem list, medical history, surgical history, family history, social history, and previous encounter notes.  Time spent on visit including pre-visit chart review and post-visit care was 32 minutes.   Corey Skains, am acting as Location manager for [employer], PA-C.  I have reviewed the above documentation for accuracy and completeness, and I agree with the above. Abby Potash, PA-C

## 2019-04-24 NOTE — Telephone Encounter (Signed)
Please check on this. Thanks!

## 2019-04-30 ENCOUNTER — Encounter (INDEPENDENT_AMBULATORY_CARE_PROVIDER_SITE_OTHER): Payer: Self-pay | Admitting: Family Medicine

## 2019-05-01 ENCOUNTER — Ambulatory Visit (INDEPENDENT_AMBULATORY_CARE_PROVIDER_SITE_OTHER): Payer: BC Managed Care – PPO | Admitting: Physician Assistant

## 2019-05-02 ENCOUNTER — Other Ambulatory Visit: Payer: Self-pay | Admitting: Internal Medicine

## 2019-05-02 ENCOUNTER — Ambulatory Visit (INDEPENDENT_AMBULATORY_CARE_PROVIDER_SITE_OTHER): Payer: BC Managed Care – PPO | Admitting: Family Medicine

## 2019-05-02 ENCOUNTER — Other Ambulatory Visit: Payer: Self-pay | Admitting: Gastroenterology

## 2019-05-03 ENCOUNTER — Other Ambulatory Visit: Payer: Self-pay | Admitting: Internal Medicine

## 2019-05-04 ENCOUNTER — Other Ambulatory Visit: Payer: Self-pay | Admitting: Internal Medicine

## 2019-05-06 ENCOUNTER — Telehealth: Payer: Self-pay | Admitting: Internal Medicine

## 2019-05-06 DIAGNOSIS — R7303 Prediabetes: Secondary | ICD-10-CM

## 2019-05-06 DIAGNOSIS — Z0000 Encounter for general adult medical examination without abnormal findings: Secondary | ICD-10-CM

## 2019-05-06 DIAGNOSIS — I10 Essential (primary) hypertension: Secondary | ICD-10-CM

## 2019-05-06 NOTE — Telephone Encounter (Signed)
Blood work ordered

## 2019-05-06 NOTE — Telephone Encounter (Signed)
   Patient calling to request order for annual labs to be done at [facility] office

## 2019-05-06 NOTE — Telephone Encounter (Signed)
Pt wants to make sure labs are put in before CPE. I told patient not to get them done until the week before at least. Pt expressed understanding. Would like to get them done at [facility].

## 2019-05-12 ENCOUNTER — Other Ambulatory Visit (INDEPENDENT_AMBULATORY_CARE_PROVIDER_SITE_OTHER): Payer: Self-pay | Admitting: Physician Assistant

## 2019-05-12 DIAGNOSIS — I10 Essential (primary) hypertension: Secondary | ICD-10-CM

## 2019-05-13 MED ORDER — PAROXETINE HCL 20 MG PO TABS: 20.0000 mg | ORAL_TABLET | Freq: Every day | ORAL | 0 refills | Status: DC

## 2019-05-15 ENCOUNTER — Encounter (INDEPENDENT_AMBULATORY_CARE_PROVIDER_SITE_OTHER): Payer: Self-pay | Admitting: Family Medicine

## 2019-05-15 ENCOUNTER — Other Ambulatory Visit: Payer: Self-pay

## 2019-05-15 ENCOUNTER — Ambulatory Visit (INDEPENDENT_AMBULATORY_CARE_PROVIDER_SITE_OTHER): Payer: BC Managed Care – PPO | Admitting: Family Medicine

## 2019-05-15 VITALS — BP 125/81 | HR 75 | Temp 97.8°F | Ht 65.0 in | Wt 360.0 lb

## 2019-05-15 DIAGNOSIS — I10 Essential (primary) hypertension: Secondary | ICD-10-CM | POA: Diagnosis not present

## 2019-05-15 DIAGNOSIS — F411 Generalized anxiety disorder: Secondary | ICD-10-CM

## 2019-05-15 DIAGNOSIS — F3289 Other specified depressive episodes: Secondary | ICD-10-CM

## 2019-05-15 DIAGNOSIS — Z6841 Body Mass Index (BMI) 40.0 and over, adult: Secondary | ICD-10-CM

## 2019-05-15 MED ORDER — LOSARTAN POTASSIUM 25 MG PO TABS: 25.0000 mg | ORAL_TABLET | Freq: Every day | ORAL | 0 refills | Status: DC

## 2019-05-15 MED ORDER — PAROXETINE HCL 10 MG PO TABS: 15.0000 mg | ORAL_TABLET | Freq: Every day | ORAL | 0 refills | Status: DC

## 2019-05-15 MED ORDER — TOPIRAMATE 50 MG PO TABS: 50.0000 mg | ORAL_TABLET | Freq: Every day | ORAL | 0 refills | Status: DC
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
## 2019-05-15 NOTE — Progress Notes (Signed)
Chief Complaint:   OBESITY [name] is here to discuss her progress with her obesity treatment plan along with follow-up of her obesity related diagnoses. [name] is on the Category 3 Plan and states she is following her eating plan approximately 95% of the time. [name] states she is walking 30 minutes 3 times per week.  Today's visit was #: 23 Starting weight: 368 lbs Starting date: 02/14/2018 Today's weight: 360 lbs Today's date: 05/16/2019 Total lbs lost to date: 8 Total lbs lost since last in-office visit: 5  Interim History: [name] has cut out sweet tea. She reports a significant amount of emotional/stress eating. She is considering bariatric surgery and she has attended the [facility].  Subjective:   Essential hypertension  Keyari's hypertension is well controlled.  BP Readings from Last 3 Encounters:  05/15/19 125/81  03/12/19 134/71  01/24/19 128/78   Lab Results  Component Value Date   CREATININE 0.92 03/12/2019   CREATININE 0.97 09/12/2018   CREATININE 0.84 06/12/2018   GAD [name] reports anxiety is about the same as always, but she is coping. We are working on weaning her off of Paxil in order to start new SSRI. She has been on Paxil a very long time and does not feel she gains any benefit from it.  Other depression, emotional eating  [name] reports emotional eating at least 4 to 5 times per week. She reports binge-type behaviors every few weeks with increased stress. Topamax prevents her from drinking soda due to dysgeusia. She has been working on behavior modification techniques to help reduce her emotional eating and has been somewhat successful. She shows no sign of suicidal or homicidal ideations.  Assessment/Plan:   Essential hypertension  Somara is working on healthy weight loss and exercise to improve blood pressure control. Tully agrees to continue losartan (COZAAR) 25 MG tablet daily #30 with no refills. We will watch for signs of hypotension as she continues  her lifestyle modifications.  Anxiety  Marguriete agrees to decrease the dose of PARoxetine (PAXIL) 10 MG tablet to 15 mg (take 1 1/2 pills daily) (15 mg) #45 with no refills.  Other depression, emotional eating Behavior modification techniques were discussed today to help [name] deal with her emotional/non-hunger eating behaviors. We will refer patient to Dr. Mallie Mussel, our bariatric psychologist. [name] agrees to continue topiramate (TOPAMAX) 50 MG tablet daily #30 with no refills. Orders and follow up as documented in patient record.   Class 3 severe obesity with serious comorbidity and body mass index (BMI) of 60.0 to 69.9 in adult, unspecified obesity type ([place]) [name] is currently in the action stage of change. As such, her goal is to continue with weight loss efforts. She has agreed to the Category 3 Plan and practicing portion control and making smarter food choices, such as increasing vegetables and decreasing simple carbohydrates at dinner.   Exercise goals: [name] will continue with her current exercise regimen.  Behavioral modification strategies: increasing lean protein intake, decreasing simple carbohydrates, ways to avoid night time snacking and planning for success.  [name] has agreed to follow-up with our clinic in 3 weeks. She was informed of the importance of frequent follow-up visits to maximize her success with intensive lifestyle modifications for her multiple health conditions.   Objective:   Blood pressure 125/81, pulse 75, temperature 97.8 F (36.6 C), temperature source Oral, height 5' 5"  (1.651 m), weight (!) 360 lb (163.3 kg), last menstrual period 03/14/2019, SpO2 96 %. Body mass index is  59.91 kg/m.  General: Cooperative, alert, well developed, in no acute distress. HEENT: Conjunctivae and lids unremarkable. Cardiovascular: Regular rhythm.  Lungs: Normal work of breathing. Neurologic: No focal deficits.   Lab Results  Component Value Date   CREATININE 0.92 03/12/2019   BUN  14 03/12/2019   NA 140 03/12/2019   K 4.7 03/12/2019   CL 107 (H) 03/12/2019   CO2 21 03/12/2019   Lab Results  Component Value Date   ALT 11 03/12/2019   AST 8 03/12/2019   ALKPHOS 93 03/12/2019   BILITOT 0.2 03/12/2019   Lab Results  Component Value Date   HGBA1C 5.8 (H) 03/12/2019   HGBA1C 5.8 (H) 09/28/2018   HGBA1C 5.7 (H) 02/14/2018   HGBA1C 6.1 11/24/2017   HGBA1C 5.9 05/19/2017   Lab Results  Component Value Date   INSULIN 27.7 (H) 03/12/2019   INSULIN 2.0 (L) 09/28/2018   INSULIN 19.1 02/14/2018   Lab Results  Component Value Date   TSH 2.480 02/14/2018   Lab Results  Component Value Date   CHOL 189 03/12/2019   HDL 42 03/12/2019   LDLCALC 127 (H) 03/12/2019   LDLDIRECT 154.1 08/16/2012   TRIG 108 03/12/2019   CHOLHDL 4 11/24/2017   Lab Results  Component Value Date   WBC 9.8 09/12/2018   HGB 12.1 09/12/2018   HCT 39.0 09/12/2018   MCV 94.2 09/12/2018   PLT 386 09/12/2018   No results found for: IRON, TIBC, FERRITIN   Ref. Range 03/12/2019 00:00  Vitamin D, 25-Hydroxy Latest Ref Range: 30.0 - 100.0 ng/mL 28.6 (L)    Attestation Statements:   Reviewed by clinician on day of visit: allergies, medications, problem list, medical history, surgical history, family history, social history, and previous encounter notes.  Corey Skains, am acting as Location manager for [employer], FNP-C.  I have reviewed the above documentation for accuracy and completeness, and I agree with the above. -  Kelcy Laible [employer], FNP-C

## 2019-05-16 ENCOUNTER — Encounter (INDEPENDENT_AMBULATORY_CARE_PROVIDER_SITE_OTHER): Payer: Self-pay | Admitting: Family Medicine

## 2019-05-17 ENCOUNTER — Other Ambulatory Visit (INDEPENDENT_AMBULATORY_CARE_PROVIDER_SITE_OTHER): Payer: Self-pay | Admitting: Physician Assistant

## 2019-05-17 ENCOUNTER — Ambulatory Visit (INDEPENDENT_AMBULATORY_CARE_PROVIDER_SITE_OTHER): Payer: BC Managed Care – PPO | Admitting: *Deleted

## 2019-05-17 ENCOUNTER — Other Ambulatory Visit: Payer: Self-pay

## 2019-05-17 DIAGNOSIS — E538 Deficiency of other specified B group vitamins: Secondary | ICD-10-CM

## 2019-05-17 DIAGNOSIS — E559 Vitamin D deficiency, unspecified: Secondary | ICD-10-CM

## 2019-05-17 MED ORDER — CYANOCOBALAMIN 1000 MCG/ML IJ SOLN
1000.0000 ug | Freq: Once | INTRAMUSCULAR | Status: AC
  Administered 2019-05-17: 1000 ug via INTRAMUSCULAR

## 2019-05-17 NOTE — Progress Notes (Addendum)
Pls cosign for B12..[name]    b12 Injection given.   Binnie Rail, MD

## 2019-05-18 ENCOUNTER — Other Ambulatory Visit: Payer: Self-pay | Admitting: Gastroenterology

## 2019-05-21 ENCOUNTER — Other Ambulatory Visit: Payer: Self-pay

## 2019-05-21 ENCOUNTER — Encounter: Payer: Self-pay | Admitting: General Surgery

## 2019-05-21 ENCOUNTER — Ambulatory Visit: Payer: BC Managed Care – PPO | Admitting: General Surgery

## 2019-05-21 VITALS — BP 157/85 | HR 73 | Temp 98.4°F | Resp 18 | Ht 65.0 in | Wt 375.0 lb

## 2019-05-21 DIAGNOSIS — K439 Ventral hernia without obstruction or gangrene: Secondary | ICD-10-CM

## 2019-05-21 NOTE — Progress Notes (Signed)
[facility] History and Physical  Reason for Referral: Supraumbilical hernia  Referring Physician:  Rosealee Albee  Chief Complaint    Umbilical Hernia      [name] is a 49 y.o. female.  HPI: Ms. 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 This hernia has only ever contained fat. The patient reports that she has been having this upper stomach spasms/ radiation of some pain from her right side towards her midline with movement. She denies any issues with nausea or vomiting. She has been having regular Bms. She does report losing weight recently and then gained it back due to [place].  She says that her stomach looks like she is pregnant and she is worried about there being something wrong. 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    She has been working with [facility] through [place] and says they have started to bring up the option of the gastric sleeve and referral. She also has a history of a left adrenal adenoma that has been slowly increasing in size and is under the care of Endocrinologist, Dr. Hartford Poli with [facility] and does not have a functioning adenoma at this time and is undergoing surveillance appropriately. Her most recent CT in our system measures the adenoma at about 3.5 cm.     Past Medical History:  Diagnosis Date  . Anxiety   . ASTHMA   . B12 deficiency   . DELAYED GASTRIC EMPTYING   . DEPRESSION   . GERD   . Glucose intolerance (impaired glucose tolerance) 04/09/2015  . Hypertension   . OBESITY   . OBSTRUCTIVE SLEEP APNEA   . POLYCYSTIC OVARIAN DISEASE   . Prediabetes   . SMOKER   . SOB (shortness of breath)   . SVT (supraventricular tachycardia) ([place])   . Tachycardia   . Umbilical hernia   . Umbilical hernia   .  Vitamin B12 deficiency 03/2015 dx   start IM replacement q 30d  . Vitamin D deficiency 03/2015 dx    Past Surgical History:  Procedure Laterality Date  . CESAREAN SECTION  97 & 98   x's 2  . Cocxyl removal  1995  . DILATION AND CURETTAGE OF UTERUS    . LAPAROSCOPY    . TUBAL LIGATION  1998    Family History  Problem Relation Age of Onset  . Breast cancer Mother        dx 11/2009  . Diabetes Mother   . High blood pressure Mother   . Thyroid disease Mother   . Anxiety disorder Mother   . Obesity Mother   . Diabetes Maternal Aunt   . Prostate cancer Maternal Grandfather 74       also hx colon polyps  . Heart disease Paternal Grandmother   . Pancreatic cancer Paternal Grandmother 45  . High blood pressure Father   . High Cholesterol Father   . Depression Father   . Heart murmur Sister   . Adrenal disorder Neg Hx     Social History   Tobacco Use  . Smoking status: Former Smoker    Packs/day: 0.25    Years: 30.00    Pack years: 7.50    Types: Cigarettes  Start date: 10/23/1987    Quit date: 10/23/2017    Years since quitting: 1.5  . Smokeless tobacco: Never Used  Substance Use Topics  . Alcohol use: Yes    Alcohol/week: 0.0 standard drinks    Comment: Rarely  . Drug use: No    Medications: I have reviewed the patient's current medications. Allergies as of 05/21/2019   No Known Allergies     Medication List       Accurate as of May 21, 2019  9:51 AM. If you have any questions, ask your nurse or doctor.        ALPRAZolam 0.5 MG tablet Commonly known as: Xanax Take 1 tablet (0.5 mg total) by mouth 3 times/day as needed-between meals & bedtime for anxiety.   B-12 Compliance Injection 1000 MCG/ML Kit Generic drug: Cyanocobalamin Inject 1,000 mcg as directed every 30 (thirty) days.   losartan 25 MG tablet Commonly known as: Cozaar Take 1 tablet (25 mg total) by mouth daily.   metFORMIN 500 MG tablet Commonly known as: GLUCOPHAGE Take 1 tablet (500  mg total) by mouth 2 (two) times daily with a meal.   metoprolol tartrate 25 MG tablet Commonly known as: LOPRESSOR Take 0.5 tablets (12.5 mg total) by mouth 2 (two) times daily.   pantoprazole 40 MG tablet Commonly known as: PROTONIX TAKE 1 TABLET BY MOUTH EVERY DAY   PARoxetine 10 MG tablet Commonly known as: PAXIL Take 1.5 tablets (15 mg total) by mouth daily.   topiramate 50 MG tablet Commonly known as: Topamax Take 1 tablet (50 mg total) by mouth daily.   UNABLE TO FIND Med Name: CPAP   valACYclovir 1000 MG tablet Commonly known as: VALTREX TAKE 2 TABLETS EVERY 12 HOURS FOR 1 DAY START ASAP AFTER SYTPTOM ONSET   Vitamin D (Ergocalciferol) 1.25 MG (50000 UNIT) Caps capsule Commonly known as: DRISDOL TAKE 1 CAPSULE BY MOUTH ONE TIME PER WEEK        ROS:  A comprehensive review of systems was negative except for: Respiratory: positive for SOB Gastrointestinal: positive for abdominal pain and nausea Endocrine: positive for tired and sluggish  Blood pressure (!) 157/85, pulse 73, temperature 98.4 F (36.9 C), temperature source Oral, resp. rate 18, height 5' 5"  (1.651 m), weight (!) 375 lb (170.1 kg), SpO2 96 %. Physical Exam Vitals reviewed.  Constitutional:      Appearance: She is obese.  HENT:     Head: Normocephalic and atraumatic.     Nose: Nose normal.     Mouth/Throat:     Mouth: Mucous membranes are moist.  Eyes:     Extraocular Movements: Extraocular movements intact.     Pupils: Pupils are equal, round, and reactive to light.  Cardiovascular:     Rate and Rhythm: Normal rate and regular rhythm.  Pulmonary:     Effort: Pulmonary effort is normal.     Breath sounds: Normal breath sounds.  Abdominal:     General: There is no distension.     Palpations: Abdomen is soft.     Tenderness: There is abdominal tenderness.     Comments: Large abdominal panus, midline bulge at the umbilicus, no palpable hernia defect due to thickness of abdominal wall, minor  tenderness over midline bulging   Musculoskeletal:        General: Normal range of motion.     Cervical back: No rigidity.  Skin:    General: Skin is warm.  Neurological:     General: No focal deficit  present.     Mental Status: She is alert and oriented to person, place, and time.  Psychiatric:        Mood and Affect: Mood normal.        Behavior: Behavior normal.        Thought Content: Thought content normal.        Judgment: Judgment normal.     Results: Personally reviewed- hernia defect at about 3cm with mushroom of fat into the subcutaneous tissue, no bowel, adrenal gland about 3.3 to 3.5 on this image; discussed with Dr. Thornton Papas- prior none contrast CT 2017 consistent with adenoma in contour and HFU < 1   CLINICAL DATA:  Periumbilical pain for several hours  EXAM: CT ABDOMEN AND PELVIS WITH CONTRAST  TECHNIQUE: Multidetector CT imaging of the abdomen and pelvis was performed using the standard protocol following bolus administration of intravenous contrast.  CONTRAST:  173m OMNIPAQUE 300  COMPARISON:  03/24/2016  FINDINGS: Lower chest: No acute abnormality.  Hepatobiliary: No focal liver abnormality is seen. No gallstones, gallbladder wall thickening, or biliary dilatation.  Pancreas: Unremarkable. No pancreatic ductal dilatation or surrounding inflammatory changes.  Spleen: Normal in size without focal abnormality.  Adrenals/Urinary Tract: Right adrenal gland is stable in appearance. A hypodense lesion in the left adrenal gland is noted measuring 3.5 cm in greatest dimension. This has enlarged in size from 2.7 cm on the prior exam. The kidneys are well visualized bilaterally. No renal calculi or obstructive changes are noted. Normal excretion of contrast material is noted. The bladder is partially distended.  Stomach/Bowel: The appendix is within normal limits. No obstructive or inflammatory changes of large or small bowel are  seen.  Vascular/Lymphatic: No significant vascular findings are present. No enlarged abdominal or pelvic lymph nodes.  Reproductive: Uterus and bilateral adnexa are unremarkable.  Other: Large fat containing anterior abdominal wall hernia is noted just above the umbilicus. The neck measures approximately 3 cm in greatest dimension. No bowel is noted within.  Musculoskeletal: No acute or significant osseous findings.  IMPRESSION: Fat containing supraumbilical hernia in the midline. No bowel is noted within.  Hypodense lesion within the left adrenal gland. This again likely represents adenoma but has increased in size by 8 mm when compared with the prior exam.   Electronically Signed   By: MInez CatalinaM.D.   On: 09/12/2018 12:26  Assessment & Plan:  GTEKILA CAILLOUETis a 49y.o. female with a chronic fat containing supraumbilical hernia that has slightly increased in size but still only contains fat. She has some spasm type pain from the right side which could just be muscle pain and strain from her weight gain.  Her BMI is 62.  She is trying to lose weight and is considering gastric sleeve. She is anxious about any anesthesia due to her obesity and the risk. At this time, I told her her hernia contains fat and is chronic in nature, I would not recommend repairing it now as she is at greater risk of complications and recurrences from her BMI. She also smokes and is prediabetic which add to her risk of recurrence. I explained that some surgeons do not repair hernias in patients with BMI over 35-40 but that there are times when exceptions have to be made for symptoms but I do not think she is there at this time.   She is tearful and anxious during our discussion but very reasonable. She hates her weight and wants to do something about it.  I encouraged her to look into obesity surgery.    She is going to continue to follow with her Endocrinologist regarding her adenoma, and knows that  if it continues to grow, gets > 4cm or becomes functional that it will need to be removed.   Discussed the option of an abdominal binder to help with her hernia.   - Follow up PRN   All questions were answered to the satisfaction of the patient.    Virl Cagey 05/21/2019, 9:51 AM

## 2019-05-21 NOTE — Patient Instructions (Signed)
Ventral Hernia  A ventral hernia is a bulge of tissue from inside the abdomen that pushes through a weak area of the muscles that form the front wall of the abdomen. The tissues inside the abdomen are inside a sac (peritoneum). These tissues include the small intestine, large intestine, and the fatty tissue that covers the intestines (omentum). Sometimes, the bulge that forms a hernia contains intestines. Other hernias contain only fat. Ventral hernias do not go away without surgical treatment. There are several types of ventral hernias. You may have:  A hernia at an incision site from previous abdominal surgery (incisional hernia).  A hernia just above the belly button (epigastric hernia), or at the belly button (umbilical hernia). These types of hernias can develop from heavy lifting or straining.  A hernia that comes and goes (reducible hernia). It may be visible only when you lift or strain. This type of hernia can be pushed back into the abdomen (reduced).  A hernia that traps abdominal tissue inside the hernia (incarcerated hernia). This type of hernia does not reduce.  A hernia that cuts off blood flow to the tissues inside the hernia (strangulated hernia). The tissues can start to die if this happens. This is a very painful bulge that cannot be reduced. A strangulated hernia is a medical emergency. What are the causes? This condition is caused by abdominal tissue putting pressure on an area of weakness in the abdominal muscles. What increases the risk? The following factors may make you more likely to develop this condition:  Being female.  Being 60 or older.  Being overweight or obese.  Having had previous abdominal surgery, especially if there was an infection after surgery.  Having had an injury to the abdominal wall.  Having had several pregnancies.  Having a buildup of fluid inside the abdomen (ascites). What are the signs or symptoms? The only symptom of a ventral hernia  may be a painless bulge in the abdomen. A reducible hernia may be visible only when you strain, cough, or lift. Other symptoms may include:  Dull pain.  A feeling of pressure. Signs and symptoms of a strangulated hernia may include:  Increasing pain.  Nausea and vomiting.  Pain when pressing on the hernia.  The skin over the hernia turning red or purple.  Constipation.  Blood in the stool (feces). How is this diagnosed? This condition may be diagnosed based on:  Your symptoms.  Your medical history.  A physical exam. You may be asked to cough or strain while standing. These actions increase the pressure inside your abdomen and force the hernia through the opening in your muscles. Your health care provider may try to reduce the hernia by pressing on it.  Imaging studies, such as an ultrasound or CT scan. How is this treated? This condition is treated with surgery. If you have a strangulated hernia, surgery is done as soon as possible. If your hernia is small and not incarcerated, you may be asked to lose some weight before surgery. Follow these instructions at home:  Follow instructions from your health care provider about eating or drinking restrictions.  If you are overweight, your health care provider may recommend that you increase your activity level and eat a healthier diet.  Do not lift anything that is heavier than 10 lb (4.5 kg).  Return to your normal activities as told by your health care provider. Ask your health care provider what activities are safe for you. You may need to avoid activities   that increase pressure on your hernia.  Take over-the-counter and prescription medicines only as told by your health care provider.  Keep all follow-up visits as told by your health care provider. This is important. Contact a health care provider if:  Your hernia gets larger.  Your hernia becomes painful. Get help right away if:  Your hernia becomes increasingly  painful.  You have pain along with any of the following: ? Changes in skin color in the area of the hernia. ? Nausea. ? Vomiting. ? Fever. Summary  A ventral hernia is a bulge of tissue from inside the abdomen that pushes through a weak area of the muscles that form the front wall of the abdomen.  This condition is treated with surgery, which may be urgent depending on your hernia.  Do not lift anything that is heavier than 10 lb (4.5 kg), and follow activity instructions from your health care provider. This information is not intended to replace advice given to you by your health care provider. Make sure you discuss any questions you have with your health care provider. Document Revised: 05/10/2017 Document Reviewed: 10/17/2016 Elsevier Patient Education  [place].   Laparoscopic Ventral Hernia Repair Laparoscopic ventral hernia repairis a procedure to fix a bulge of tissue that pushes through a weak area of muscle in the abdomen (ventral hernia). A ventral hernia may be at the belly button (umbilical), above the belly button (epigastric), or at the incision site from previous abdominal surgery (incisional hernia). You may have this procedure as emergency surgery if part of your intestine gets trapped inside the hernia and starts to lose its blood supply (strangulation). Laparoscopic surgery is done through small incisions using a thin surgical telescope with a light and camera on the end (laparoscope). During surgery, your surgeon will use images from the laparoscope to guide the procedure. A mesh screen will be placed in the hernia to close the opening and strengthen the abdominal wall. Tell a health care provider about:  Any allergies you have.  All medicines you are taking, including vitamins, herbs, eye drops, creams, and over-the-counter medicines.  Any problems you or family members have had with anesthetic medicines.  Any blood disorders you have.  Any surgeries you  have had.  Any medical conditions you have.  Whether you are pregnant or may be pregnant. What are the risks? Generally, this is a safe procedure. However, problems may occur, including:  Infection.  Bleeding.  Allergic reactions to medicines.  Damage to other structures or organs in the abdomen.  Trouble urinating or having a bowel movement after surgery.  Pneumonia.  Blood clots.  The hernia coming back after surgery.  Fluid buildup in the area of the hernia. In some cases, your health care provider may need to switch from a laparoscopic procedure to a procedure that is done through a single, larger incision in the abdomen (open procedure). You may need an open procedure if:  You have a hernia that is difficult to repair.  Your organs are hard to see.  You have bleeding problems during the laparoscopic procedure. Medicines  Ask your health care provider about: ? Changing or stopping your regular medicines. This is especially important if you are taking diabetes medicines or blood thinners. ? Taking medicines such as aspirin and ibuprofen. These medicines can thin your blood. Do not take these medicines before your procedure if your health care provider instructs you not to.  You may be given antibiotic medicine to help prevent  infection. General instructions   You may be asked to take a laxative or do an enema to empty your bowel before surgery (bowel prep).  Do not use any products that contain nicotine or tobacco, such as cigarettes and e-cigarettes. If you need help quitting, ask your health care provider.  You may need to have tests before the procedure, such as: ? Blood tests. ? Urine tests. ? Abdominal ultrasound. ? Chest X-ray. ? Electrocardiogram (ECG).  Plan to have someone take you home from the hospital or clinic.  If you will be going home right after the procedure, plan to have someone with you for 24 hours. What happens during the  procedure?  To reduce your risk of infection: ? Your health care team will wash or sanitize their hands. ? Your skin will be washed with soap.  An IV tube will be inserted into one of your veins.  You will be given one or more of the following: ? A medicine to help you relax (sedative). ? A medicine to make you fall asleep (general anesthetic).  A small incision will be made in your abdomen. A hollow metal tube (trocar) will be placed through the incision.  A tube will be placed through the trocar to inflate your abdomen with air-like gas. This makes it easier for your surgeon to see inside your abdomen and do the repair.  The laparoscope will be inserted into your abdomen through the trocar. The laparoscope will send images to a monitor in the operating room.  Other trocars will be put through other small incisions in your abdomen. The surgical instruments needed for the procedure will be placed through these trocars.  The tissue or intestines that make up the hernia will be moved back into place.  The edges of the hernia may be stitched together.  A piece of mesh will be used to close the hernia. Stitches (sutures), clips, or staples will be used to keep the mesh in place.  A bandage (dressing) or skin glue will be put over the incisions. The procedure may vary among health care providers and hospitals. What happens after the procedure?  Your blood pressure, heart rate, breathing rate, and blood oxygen level will be monitored until the medicines you were given have worn off.  You will continue to receive fluids and medicines through an IV tube. Your IV tube will be removed when you can drink clear fluids.  You will be given pain medicine as needed.  You will be encouraged to get up and walk around as soon as possible.  You may have to wear compression stockings. These stockings help to prevent blood clots and reduce swelling in your legs.  You will be shown how to do deep  breathing exercises to help prevent a lung infection.  Do not drive for 24 hours if you were given a sedative. This information is not intended to replace advice given to you by your health care provider. Make sure you discuss any questions you have with your health care provider. Document Revised: 03/10/2017 Document Reviewed: 11/13/2015 Elsevier Patient Education  2020 [employer].

## 2019-05-29 ENCOUNTER — Ambulatory Visit (INDEPENDENT_AMBULATORY_CARE_PROVIDER_SITE_OTHER): Payer: BC Managed Care – PPO | Admitting: Psychology

## 2019-05-29 ENCOUNTER — Other Ambulatory Visit: Payer: Self-pay

## 2019-05-29 DIAGNOSIS — F3289 Other specified depressive episodes: Secondary | ICD-10-CM | POA: Diagnosis not present

## 2019-05-30 NOTE — Progress Notes (Signed)
  Office: [phone]  /  Fax: [phone]    Date: June 13, 2019   Appointment Start Time: 10:00am Duration: 28 minutes Provider: Glennie Isle, Psy.D. 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 female presenting via [place] for a follow-up appointment to address the previously established treatment goal of decreasing emotional eating. Today's appointment was a telepsychological visit due to COVID-19. [name] provided verbal consent for today's telepsychological appointment and she is aware she is responsible for securing confidentiality on her end of the session. Prior to proceeding with today's appointment, [name]'s physical location at the time of this appointment was obtained as well a phone number she could be reached at in the event of technical difficulties. [name] and this provider participated in today's telepsychological service.   This provider conducted a brief check-in. [name] shared her eating habits having been going good, noting she was switched to the low carbohydrate plan by Jake Bathe, FNP-C. Emotional and physical hunger was reviewed. She recalled eating secondary to boredom. Psychoeducation regarding triggers for emotional eating was provided. [name] was provided a handout, and encouraged to utilize the handout between now and the next appointment to increase awareness of triggers and frequency. [name] agreed. This provider also discussed behavioral strategies for specific triggers, such as placing the utensil down when conversing to avoid mindless eating. [name] provided verbal consent during today's appointment for this provider to send a handout about triggers via e-mail. [name] was receptive to today's appointment as evidenced by openness to sharing, responsiveness to feedback, and willingness to explore triggers for emotional  eating.  Mental Status Examination:  Appearance: well groomed and appropriate hygiene  Behavior: appropriate to circumstances Mood: euthymic Affect: mood congruent Speech: normal in rate, volume, and tone Eye Contact: appropriate Psychomotor Activity: appropriate Gait: unable to assess Thought Process: linear, logical, and goal directed  Thought Content/Perception: no hallucinations, delusions, bizarre thinking or behavior reported or observed and no evidence of suicidal and homicidal ideation, plan, and intent Orientation: time, person, place and purpose of appointment Memory/Concentration: memory, attention, language, and fund of knowledge intact  Insight/Judgment: good  Interventions:  Conducted a brief chart review Provided empathic reflections and validation Reviewed content from the previous session Employed supportive psychotherapy interventions to facilitate reduced distress and to improve coping skills with identified stressors Employed motivational interviewing skills to assess patient's willingness/desire to adhere to recommended medical treatments and assignments Psychoeducation provided regarding triggers for emotional eating  DSM-5 Diagnosis: 311 (F32.8) Other Specified Depressive Disorder, Emotional Eating Behaviors  Treatment Goal & Progress: During the initial appointment with this provider, the following treatment goal was established: decrease emotional eating. [name] has demonstrated progress in her goal as evidenced by increased awareness of hunger patterns.   Plan: The next appointment will be scheduled in two weeks, which will be via [employer]. The next session will focus on working towards the established treatment goal.

## 2019-06-05 ENCOUNTER — Other Ambulatory Visit: Payer: Self-pay

## 2019-06-05 ENCOUNTER — Encounter (INDEPENDENT_AMBULATORY_CARE_PROVIDER_SITE_OTHER): Payer: Self-pay | Admitting: Family Medicine

## 2019-06-05 ENCOUNTER — Ambulatory Visit (INDEPENDENT_AMBULATORY_CARE_PROVIDER_SITE_OTHER): Payer: BC Managed Care – PPO | Admitting: Family Medicine

## 2019-06-05 VITALS — BP 115/69 | HR 64 | Temp 97.7°F | Ht 65.0 in | Wt 368.0 lb

## 2019-06-05 DIAGNOSIS — Z9189 Other specified personal risk factors, not elsewhere classified: Secondary | ICD-10-CM

## 2019-06-05 DIAGNOSIS — Z6841 Body Mass Index (BMI) 40.0 and over, adult: Secondary | ICD-10-CM

## 2019-06-05 DIAGNOSIS — I10 Essential (primary) hypertension: Secondary | ICD-10-CM | POA: Diagnosis not present

## 2019-06-05 DIAGNOSIS — F411 Generalized anxiety disorder: Secondary | ICD-10-CM | POA: Diagnosis not present

## 2019-06-05 MED ORDER — TOPIRAMATE 50 MG PO TABS: 50.0000 mg | ORAL_TABLET | Freq: Every day | ORAL | 0 refills | Status: DC

## 2019-06-05 MED ORDER — PAROXETINE HCL 10 MG PO TABS: 10.0000 mg | ORAL_TABLET | Freq: Every day | ORAL | 0 refills | Status: DC

## 2019-06-05 MED ORDER — LOSARTAN POTASSIUM 25 MG PO TABS: 25.0000 mg | ORAL_TABLET | Freq: Every day | ORAL | 0 refills | Status: DC

## 2019-06-05 NOTE — Progress Notes (Signed)
Chief Complaint:   OBESITY [name] is here to discuss her progress with her obesity treatment plan along with follow-up of her obesity related diagnoses. [name] is on the Category 3 Plan and states she is following her eating plan approximately 100% of the time. [name] states she is walking 30 minutes 2 times per week.  Today's visit was #: 24 Starting weight: 368 lbs Starting date: 02/14/2018 Today's weight: 368 lbs Today's date: 06/05/2019 Total lbs lost to date: 0 Total lbs lost since last in-office visit: 0  Interim History: [name] is unsure why she has gained weight. She notes she has adhered very well to the plan the past few weeks. She feels we may have made a mistake on recording her weight at the last visit. She is considering bariatric surgery and has an upcoming appointment with [name] at [facility].  Subjective:   GAD (generalized anxiety disorder)  We are weaning patient off of Paxil in preparation to start new SSRI.[name] She reports a bad experience in the past when she was weaned off Paxil too rapidly. She is currently down to 15 mg per day. She does report a bit more anxiety at this dose. She has started seeing [name] and has had one visit. [name] has cut out soda since starting Topamax.  Essential hypertension  [name]'s blood pressure is well controlled on Losartan.  BP Readings from Last 3 Encounters:  06/05/19 115/69  05/21/19 (!) 157/85  05/15/19 125/81   Lab Results  Component Value Date   CREATININE 0.92 03/12/2019   CREATININE 0.97 09/12/2018   CREATININE 0.84 06/12/2018   At risk for diabetes mellitus [name] is at higher than average risk for developing diabetes due to her obesity.   Assessment/Plan:   GAD (generalized anxiety disorder)  [name] agrees to decrease the dose of Paxil to 10 mg daily #30 with no refills. She agrees to continue Topamax 50 mg qHS #30 with no refills.  Essential hypertension [name] is working on healthy weight loss and exercise  to improve blood pressure control. She agrees to continue Losartan 25 mg daily #30 with no refills.   At risk for diabetes mellitus [name] was given approximately 15 minutes of diabetes education and counseling today. We discussed intensive lifestyle modifications today with an emphasis on weight loss as well as increasing exercise and decreasing simple carbohydrates in her diet. We also reviewed medication options with an emphasis on risk versus benefit of those discussed.   Repetitive spaced learning was employed today to elicit superior memory formation and behavioral change.  Class 3 severe obesity with serious comorbidity and body mass index (BMI) of 60.0 to 69.9 in adult, unspecified obesity type ([place]) [name] is currently in the action stage of change. As such, her goal is to continue with weight loss efforts. She has agreed to following a lower carbohydrate, vegetable and lean protein rich diet plan.   Exercise goals: [name] will continue walking for 30 minutes, 2 times per week.  Behavioral modification strategies: increasing lean protein intake, decreasing simple carbohydrates and planning for success.  [name] is seeing the bariatric surgeon at [facility] on 07/09/19. We discussed she will likely need to lose weight to have the surgery and she will also need to quit smoking.  [name] has agreed to follow-up with our clinic in 3 weeks. She was informed of the importance of frequent follow-up visits to maximize her success with intensive lifestyle modifications for her multiple health conditions.   Objective:  Blood pressure 115/69, pulse 64, temperature 97.7 F (36.5 C), temperature source Oral, height 5' 5"  (1.651 m), weight (!) 368 lb (166.9 kg), last menstrual period 05/05/2019, SpO2 98 %. Body mass index is 61.24 kg/m.  General: Cooperative, alert, well developed, in no acute distress. HEENT: Conjunctivae and lids unremarkable. Cardiovascular: Regular rhythm.  Lungs: Normal work of  breathing. Neurologic: No focal deficits.   Lab Results  Component Value Date   CREATININE 0.92 03/12/2019   BUN 14 03/12/2019   NA 140 03/12/2019   K 4.7 03/12/2019   CL 107 (H) 03/12/2019   CO2 21 03/12/2019   Lab Results  Component Value Date   ALT 11 03/12/2019   AST 8 03/12/2019   ALKPHOS 93 03/12/2019   BILITOT 0.2 03/12/2019   Lab Results  Component Value Date   HGBA1C 5.8 (H) 03/12/2019   HGBA1C 5.8 (H) 09/28/2018   HGBA1C 5.7 (H) 02/14/2018   HGBA1C 6.1 11/24/2017   HGBA1C 5.9 05/19/2017   Lab Results  Component Value Date   INSULIN 27.7 (H) 03/12/2019   INSULIN 2.0 (L) 09/28/2018   INSULIN 19.1 02/14/2018   Lab Results  Component Value Date   TSH 2.480 02/14/2018   Lab Results  Component Value Date   CHOL 189 03/12/2019   HDL 42 03/12/2019   LDLCALC 127 (H) 03/12/2019   LDLDIRECT 154.1 08/16/2012   TRIG 108 03/12/2019   CHOLHDL 4 11/24/2017   Lab Results  Component Value Date   WBC 9.8 09/12/2018   HGB 12.1 09/12/2018   HCT 39.0 09/12/2018   MCV 94.2 09/12/2018   PLT 386 09/12/2018   No results found for: IRON, TIBC, FERRITIN   Ref. Range 03/12/2019 00:00  Vitamin D, 25-Hydroxy Latest Ref Range: 30.0 - 100.0 ng/mL 28.6 (L)    Attestation Statements:   Reviewed by clinician on day of visit: allergies, medications, problem list, medical history, surgical history, family history, social history, and previous encounter notes.  Corey Skains, am acting as Location manager for [employer], FNP-C.  I have reviewed the above documentation for accuracy and completeness, and I agree with the above. -  [name] [employer], FNP-C

## 2019-06-06 ENCOUNTER — Encounter (INDEPENDENT_AMBULATORY_CARE_PROVIDER_SITE_OTHER): Payer: Self-pay | Admitting: Family Medicine

## 2019-06-06 DIAGNOSIS — F411 Generalized anxiety disorder: Secondary | ICD-10-CM | POA: Insufficient documentation

## 2019-06-08 ENCOUNTER — Encounter (INDEPENDENT_AMBULATORY_CARE_PROVIDER_SITE_OTHER): Payer: Self-pay | Admitting: Family Medicine

## 2019-06-10 NOTE — Telephone Encounter (Signed)
Please Advise

## 2019-06-11 ENCOUNTER — Other Ambulatory Visit (INDEPENDENT_AMBULATORY_CARE_PROVIDER_SITE_OTHER): Payer: BC Managed Care – PPO

## 2019-06-11 DIAGNOSIS — I10 Essential (primary) hypertension: Secondary | ICD-10-CM

## 2019-06-11 DIAGNOSIS — R7303 Prediabetes: Secondary | ICD-10-CM

## 2019-06-11 DIAGNOSIS — Z0000 Encounter for general adult medical examination without abnormal findings: Secondary | ICD-10-CM

## 2019-06-11 LAB — LIPID PANEL
Cholesterol: 177 mg/dL (ref 0–200)
HDL: 39 mg/dL — ABNORMAL LOW (ref >39.00)
LDL Cholesterol: 109 mg/dL — ABNORMAL HIGH (ref 0–99)
NonHDL: 137.81
Total CHOL/HDL Ratio: 5
Triglycerides: 144 mg/dL (ref 0.0–149.0)
VLDL: 28.8 mg/dL (ref 0.0–40.0)

## 2019-06-11 LAB — COMPREHENSIVE METABOLIC PANEL
ALT: 14 U/L (ref 0–35)
AST: 7 U/L (ref 0–37)
Albumin: 4 g/dL (ref 3.5–5.2)
Alkaline Phosphatase: 69 U/L (ref 39–117)
BUN: 19 mg/dL (ref 6–23)
CO2: 22 mEq/L (ref 19–32)
Calcium: 9.4 mg/dL (ref 8.4–10.5)
Chloride: 106 mEq/L (ref 96–112)
Creatinine, Ser: 0.94 mg/dL (ref 0.40–1.20)
GFR: 63.71 mL/min (ref >60.00)
Glucose, Bld: 109 mg/dL — ABNORMAL HIGH (ref 70–99)
Potassium: 4 mEq/L (ref 3.5–5.1)
Sodium: 137 mEq/L (ref 135–145)
Total Bilirubin: 0.3 mg/dL (ref 0.2–1.2)
Total Protein: 7.7 g/dL (ref 6.0–8.3)

## 2019-06-11 LAB — CBC WITH DIFFERENTIAL/PLATELET
Basophils Absolute: 0 10*3/uL (ref 0.0–0.1)
Basophils Relative: 0.4 % (ref 0.0–3.0)
Eosinophils Absolute: 0.1 10*3/uL (ref 0.0–0.7)
Eosinophils Relative: 1.3 % (ref 0.0–5.0)
HCT: 36.1 % (ref 36.0–46.0)
Hemoglobin: 12 g/dL (ref 12.0–15.0)
Lymphocytes Relative: 18.8 % (ref 12.0–46.0)
Lymphs Abs: 1.9 10*3/uL (ref 0.7–4.0)
MCHC: 33.3 g/dL (ref 30.0–36.0)
MCV: 89.9 fl (ref 78.0–100.0)
Monocytes Absolute: 0.5 10*3/uL (ref 0.1–1.0)
Monocytes Relative: 5 % (ref 3.0–12.0)
Neutro Abs: 7.5 10*3/uL (ref 1.4–7.7)
Neutrophils Relative %: 74.5 % (ref 43.0–77.0)
Platelets: 323 10*3/uL (ref 150.0–400.0)
RBC: 4.01 Mil/uL (ref 3.87–5.11)
RDW: 15.1 % (ref 11.5–15.5)
WBC: 10.1 10*3/uL (ref 4.0–10.5)

## 2019-06-11 LAB — TSH: TSH: 4.19 u[IU]/mL (ref 0.35–4.50)

## 2019-06-11 LAB — HEMOGLOBIN A1C: Hgb A1c MFr Bld: 6 % (ref 4.6–6.5)

## 2019-06-13 ENCOUNTER — Ambulatory Visit (INDEPENDENT_AMBULATORY_CARE_PROVIDER_SITE_OTHER): Payer: BC Managed Care – PPO | Admitting: Psychology

## 2019-06-13 ENCOUNTER — Other Ambulatory Visit: Payer: Self-pay

## 2019-06-13 DIAGNOSIS — F5089 Other specified eating disorder: Secondary | ICD-10-CM | POA: Diagnosis not present

## 2019-06-13 DIAGNOSIS — F3289 Other specified depressive episodes: Secondary | ICD-10-CM | POA: Diagnosis not present

## 2019-06-13 NOTE — Progress Notes (Signed)
Subjective:    Patient ID: [name], female    DOB: October 28, 1970, 49 y.o.   MRN: [id number]  HPI She is here for a physical exam.   She is weaning off the paxil slowly with the weight management clinic.  She is on 10 mg daily now.  She does feel increased anxiety.  She is trying to do other things to help with her anxiety.   She is thinking about bariatric surgery.  She still wants to learn how to change her life habits so that this will be a permanent weight loss.  Medications and allergies reviewed with patient and updated if appropriate.  Patient Active Problem List   Diagnosis Date Noted  . Other hyperlipidemia 03/13/2019  . Skin abnormalities 11/25/2017  . 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 Skin tag 11/25/2017  . Essential hypertension 11/01/2017  . Chronic frontal sinusitis 10/30/2017  . Submandibular gland mass 10/30/2017  . Prediabetes 05/18/2017  . SOB (shortness of breath) 09/24/2016  . Palpitations 09/24/2016  . Family history of early CAD 03/23/2016  . SVT (supraventricular tachycardia) ([place]) 03/14/2016  . Adrenal nodule ([place]) 01/06/2016  . Supraumbilical hernia without gangrene and without obstruction 12/02/2015  . Morbid obesity ([place]) 04/09/2015  . Vitamin D deficiency   . Vitamin B12 deficiency   . Idiopathic intracranial hypertension 05/24/2013  . Anxiety   . Cigarette smoker 01/28/2009  . Depression 01/28/2009  . GERD 01/28/2009  . POLYCYSTIC OVARIAN DISEASE 02/10/2007  . Class 3 severe obesity with serious comorbidity and body mass index (BMI) of 60.0 to 69.9 in adult ([place]) 02/10/2007  . Obstructive sleep apnea 02/10/2007  . Cough variant asthma 02/10/2007    Current Outpatient Medications on File Prior to Visit  Medication Sig Dispense Refill  . Cyanocobalamin (B-12 COMPLIANCE INJECTION) 1000 MCG/ML KIT Inject 1,000 mcg as directed every 30 (thirty) days.    [name] losartan (COZAAR) 25 MG tablet Take 1 tablet (25 mg total) by mouth daily. 30  tablet 0  . metFORMIN (GLUCOPHAGE) 500 MG tablet Take 1 tablet (500 mg total) by mouth 2 (two) times daily with a meal. 180 tablet 3  . metoprolol tartrate (LOPRESSOR) 25 MG tablet Take 0.5 tablets (12.5 mg total) by mouth 2 (two) times daily. 90 tablet 3  . pantoprazole (PROTONIX) 40 MG tablet TAKE 1 TABLET BY MOUTH EVERY DAY 90 tablet 3  . PARoxetine (PAXIL) 10 MG tablet Take 1 tablet (10 mg total) by mouth daily. 30 tablet 0  . topiramate (TOPAMAX) 50 MG tablet Take 1 tablet (50 mg total) by mouth daily. 30 tablet 0  . UNABLE TO FIND Med Name: CPAP    . Vitamin D, Ergocalciferol, (DRISDOL) 1.25 MG (50000 UNIT) CAPS capsule TAKE 1 CAPSULE BY MOUTH ONE TIME PER WEEK 2 capsule 0   No current facility-administered medications on file prior to visit.    Past Medical History:  Diagnosis Date  . Anxiety   . ASTHMA   . B12 deficiency   . DELAYED GASTRIC EMPTYING   . DEPRESSION   . GERD   . Glucose intolerance (impaired glucose tolerance) 04/09/2015  . Hypertension   . OBESITY   . OBSTRUCTIVE SLEEP APNEA   . POLYCYSTIC OVARIAN DISEASE   . Prediabetes   . SMOKER   . SOB (shortness of breath)   . SVT (supraventricular tachycardia) ([place])   . Tachycardia   . Umbilical hernia   . Umbilical hernia   . Vitamin B12 deficiency 03/2015 dx  start IM replacement q 30d  . Vitamin D deficiency 03/2015 dx    Past Surgical History:  Procedure Laterality Date  . CESAREAN SECTION  97 & 98   x's 2  . Cocxyl removal  1995  . DILATION AND CURETTAGE OF UTERUS    . LAPAROSCOPY    . TUBAL LIGATION  1998    Social History   Socioeconomic History  . Marital status: Married    Spouse name: Amarisa Wilinski  . Number of children: 2  . Years of education: Not on file  . Highest education level: Not on file  Occupational History  . 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 Smoking status: Former Smoker    Packs/day: 0.25    Years: 30.00    Pack years:  7.50    Types: Cigarettes    Start date: 10/23/1987    Quit date: 10/23/2017    Years since quitting: 1.6  . Smokeless tobacco: Never Used  Substance and Sexual Activity  . Alcohol use: Yes    Alcohol/week: 0.0 standard drinks    Comment: Rarely  . Drug use: No  . Sexual activity: Not on file  Other Topics Concern  . Not on file  Social History Narrative   Lives with husband & 2 kids from 1st marriage. (1st husband expired 1998-leukemia) work as Merchandiser, retail, but currently unemployed   Social Determinants of Radio broadcast assistant Strain:   . Difficulty of Paying Living Expenses: Not on file  Food Insecurity:   . Worried About Charity fundraiser in the Last Year: Not on file  . Ran Out of Food in the Last Year: Not on file  Transportation Needs:   . Lack of Transportation (Medical): Not on file  . Lack of Transportation (Non-Medical): Not on file  Physical Activity:   . Days of Exercise per Week: Not on file  . Minutes of Exercise per Session: Not on file  Stress:   . Feeling of Stress : Not on file  Social Connections:   . Frequency of Communication with Friends and Family: Not on file  . Frequency of Social Gatherings with Friends and Family: Not on file  . Attends Religious Services: Not on file  . Active Member of Clubs or Organizations: Not on file  . Attends Archivist Meetings: Not on file  . Marital Status: Not on file    Family History  Problem Relation Age of Onset  . Breast cancer Mother        dx 11/2009  . Diabetes Mother   . High blood pressure Mother   . Thyroid disease Mother   . Anxiety disorder Mother   . Obesity Mother   . Diabetes Maternal Aunt   . Prostate cancer Maternal Grandfather 8       also hx colon polyps  . Heart disease Paternal Grandmother   . Pancreatic cancer Paternal Grandmother 107  . High blood pressure Father   . High Cholesterol Father   . Depression Father   . Heart murmur Sister   . Adrenal  disorder Neg Hx     Review of Systems  Constitutional: Negative for chills and fever.  Eyes: Negative for visual disturbance.  Respiratory: Negative for cough, shortness of breath and wheezing.   Cardiovascular: Negative for chest pain, palpitations and leg swelling.  Gastrointestinal: Negative for abdominal pain, blood in stool, constipation,  diarrhea and nausea.       No gerd  Genitourinary: Negative for dysuria and hematuria.  Musculoskeletal: Positive for arthralgias and myalgias.  Skin: Negative for color change and rash.  Neurological: Positive for light-headedness (occ) and headaches (occ).  Psychiatric/Behavioral: The patient is nervous/anxious.        Objective:   Vitals:   06/14/19 1319  BP: 136/84  Pulse: 77  Resp: 16  Temp: 98.7 F (37.1 C)  SpO2: 98%   Filed Weights   06/14/19 1319  Weight: (!) 367 lb 6.4 oz (166.7 kg)   Body mass index is 61.14 kg/m.  BP Readings from Last 3 Encounters:  06/14/19 136/84  06/05/19 115/69  05/21/19 (!) 157/85    Wt Readings from Last 3 Encounters:  06/14/19 (!) 367 lb 6.4 oz (166.7 kg)  06/05/19 (!) 368 lb (166.9 kg)  05/21/19 (!) 375 lb (170.1 kg)     Physical Exam Constitutional: She appears well-developed and well-nourished. No distress.  HENT:  Head: Normocephalic and atraumatic.  Right Ear: External ear normal. Normal ear canal and TM Left Ear: External ear normal.  Normal ear canal and TM Mouth/Throat: Oropharynx is clear and moist.  Eyes: Conjunctivae and EOM are normal.  Neck: Neck supple. No tracheal deviation present. No thyromegaly present.  No carotid bruit  Cardiovascular: Normal rate, regular rhythm and normal heart sounds.   No murmur heard.  No edema. Pulmonary/Chest: Effort normal and breath sounds normal. No respiratory distress. She has no wheezes. She has no rales.  Breast: deferred   Abdominal: Soft. She exhibits no distension. There is no tenderness.  Lymphadenopathy: She has no cervical  adenopathy.  Skin: Skin is warm and dry. She is not diaphoretic.  Psychiatric: She has a anxious mood and affect. Her behavior is normal.        Assessment & Plan:   Physical exam: Screening blood work    reviewed Immunizations  Up to date  Mammogram  Scheduled Gyn  Up to date   Eye:  Up to date Exercise  Not walking right now, but plans on restarting.  Discussed the importance of walking for weight loss and for anxiety Weight   Working on weight loss Substance abuse   none  See Problem List for Assessment and Plan of chronic medical problems.      This visit occurred during the SARS-CoV-2 public health emergency.  Safety protocols were in place, including screening questions prior to the visit, additional usage of staff PPE, and extensive cleaning of exam room while observing appropriate contact time as indicated for disinfecting solutions.

## 2019-06-13 NOTE — Patient Instructions (Addendum)
On your feet you likely have Stucco keratosis.  This is bengin.    Blood work was reviewed.    All other Health Maintenance issues reviewed.   All recommended immunizations and age-appropriate screenings are up-to-date or discussed.  No immunization administered today.   Medications reviewed and updated.  Changes include :   none   Please followup in 6 months    Health Maintenance, Female Adopting a healthy lifestyle and getting preventive care are important in promoting health and wellness. Ask your health care provider about:  The right schedule for you to have regular tests and exams.  Things you can do on your own to prevent diseases and keep yourself healthy. What should I know about diet, weight, and exercise? Eat a healthy diet   Eat a diet that includes plenty of vegetables, fruits, low-fat dairy products, and lean protein.  Do not eat a lot of foods that are high in solid fats, added sugars, or sodium. Maintain a healthy weight Body mass index (BMI) is used to identify weight problems. It estimates body fat based on height and weight. Your health care provider can help determine your BMI and help you achieve or maintain a healthy weight. Get regular exercise Get regular exercise. This is one of the most important things you can do for your health. Most adults should:  Exercise for at least 150 minutes each week. The exercise should increase your heart rate and make you sweat (moderate-intensity exercise).  Do strengthening exercises at least twice a week. This is in addition to the moderate-intensity exercise.  Spend less time sitting. Even light physical activity can be beneficial. Watch cholesterol and blood lipids Have your blood tested for lipids and cholesterol at 48 years of age, then have this test every 5 years. Have your cholesterol levels checked more often if:  Your lipid or cholesterol levels are high.  You are older than 48 years of age.  You are  at high risk for heart disease. What should I know about cancer screening? Depending on your health history and family history, you may need to have cancer screening at various ages. This may include screening for:  Breast cancer.  Cervical cancer.  Colorectal cancer.  Skin cancer.  Lung cancer. What should I know about heart disease, diabetes, and high blood pressure? Blood pressure and heart disease  High blood pressure causes heart disease and increases the risk of stroke. This is more likely to develop in people who have high blood pressure readings, are of African descent, or are overweight.  Have your blood pressure checked: ? Every 3-5 years if you are 10-34 years of age. ? Every year if you are 14 years old or older. Diabetes Have regular diabetes screenings. This checks your fasting blood sugar level. Have the screening done:  Once every three years after age 75 if you are at a normal weight and have a low risk for diabetes.  More often and at a younger age if you are overweight or have a high risk for diabetes. What should I know about preventing infection? Hepatitis B If you have a higher risk for hepatitis B, you should be screened for this virus. Talk with your health care provider to find out if you are at risk for hepatitis B infection. Hepatitis C Testing is recommended for:  Everyone born from [birth year] through 1964.  Anyone with known risk factors for hepatitis C. Sexually transmitted infections (STIs)  Get screened for STIs, including gonorrhea and  chlamydia, if: ? You are sexually active and are younger than 48 years of age. ? You are older than 48 years of age and your health care provider tells you that you are at risk for this type of infection. ? Your sexual activity has changed since you were last screened, and you are at increased risk for chlamydia or gonorrhea. Ask your health care provider if you are at risk.  Ask your health care provider about  whether you are at high risk for HIV. Your health care provider may recommend a prescription medicine to help prevent HIV infection. If you choose to take medicine to prevent HIV, you should first get tested for HIV. You should then be tested every 3 months for as long as you are taking the medicine. Pregnancy  If you are about to stop having your period (premenopausal) and you may become pregnant, seek counseling before you get pregnant.  Take 400 to 800 micrograms (mcg) of folic acid every day if you become pregnant.  Ask for birth control (contraception) if you want to prevent pregnancy. Osteoporosis and menopause Osteoporosis is a disease in which the bones lose minerals and strength with aging. This can result in bone fractures. If you are 36 years old or older, or if you are at risk for osteoporosis and fractures, ask your health care provider if you should:  Be screened for bone loss.  Take a calcium or vitamin D supplement to lower your risk of fractures.  Be given hormone replacement therapy (HRT) to treat symptoms of menopause. Follow these instructions at home: Lifestyle  Do not use any products that contain nicotine or tobacco, such as cigarettes, e-cigarettes, and chewing tobacco. If you need help quitting, ask your health care provider.  Do not use street drugs.  Do not share needles.  Ask your health care provider for help if you need support or information about quitting drugs. Alcohol use  Do not drink alcohol if: ? Your health care provider tells you not to drink. ? You are pregnant, may be pregnant, or are planning to become pregnant.  If you drink alcohol: ? Limit how much you use to 0-1 drink a day. ? Limit intake if you are breastfeeding.  Be aware of how much alcohol is in your drink. In the [place] one drink equals one 12 oz bottle of beer (355 mL), one 5 oz glass of wine (148 mL), or one 1 oz glass of hard liquor (44 mL). General instructions  Schedule  regular health, dental, and eye exams.  Stay current with your vaccines.  Tell your health care provider if: ? You often feel depressed. ? You have ever been abused or do not feel safe at home. Summary  Adopting a healthy lifestyle and getting preventive care are important in promoting health and wellness.  Follow your health care provider's instructions about healthy diet, exercising, and getting tested or screened for diseases.  Follow your health care provider's instructions on monitoring your cholesterol and blood pressure. This information is not intended to replace advice given to you by your health care provider. Make sure you discuss any questions you have with your health care provider. Document Revised: 03/21/2018 Document Reviewed: 03/21/2018 Elsevier Patient Education  2020 [employer].

## 2019-06-14 ENCOUNTER — Ambulatory Visit (INDEPENDENT_AMBULATORY_CARE_PROVIDER_SITE_OTHER): Payer: BC Managed Care – PPO | Admitting: Internal Medicine

## 2019-06-14 ENCOUNTER — Encounter: Payer: Self-pay | Admitting: Internal Medicine

## 2019-06-14 VITALS — BP 136/84 | HR 77 | Temp 98.7°F | Resp 16 | Ht 65.0 in | Wt 367.4 lb

## 2019-06-14 DIAGNOSIS — Z0000 Encounter for general adult medical examination without abnormal findings: Secondary | ICD-10-CM

## 2019-06-14 DIAGNOSIS — I10 Essential (primary) hypertension: Secondary | ICD-10-CM | POA: Diagnosis not present

## 2019-06-14 DIAGNOSIS — R7303 Prediabetes: Secondary | ICD-10-CM

## 2019-06-14 DIAGNOSIS — E538 Deficiency of other specified B group vitamins: Secondary | ICD-10-CM | POA: Diagnosis not present

## 2019-06-14 DIAGNOSIS — K219 Gastro-esophageal reflux disease without esophagitis: Secondary | ICD-10-CM | POA: Diagnosis not present

## 2019-06-14 DIAGNOSIS — B001 Herpesviral vesicular dermatitis: Secondary | ICD-10-CM

## 2019-06-14 DIAGNOSIS — F419 Anxiety disorder, unspecified: Secondary | ICD-10-CM

## 2019-06-14 DIAGNOSIS — K439 Ventral hernia without obstruction or gangrene: Secondary | ICD-10-CM

## 2019-06-14 DIAGNOSIS — E7849 Other hyperlipidemia: Secondary | ICD-10-CM

## 2019-06-14 DIAGNOSIS — E559 Vitamin D deficiency, unspecified: Secondary | ICD-10-CM

## 2019-06-14 MED ORDER — ALPRAZOLAM 0.5 MG PO TABS: 0.5000 mg | ORAL_TABLET | Freq: Two times a day (BID) | ORAL | 0 refills | Status: DC | PRN

## 2019-06-14 MED ORDER — VALACYCLOVIR HCL 1 G PO TABS: ORAL_TABLET | ORAL | 3 refills | Status: DC

## 2019-06-14 MED ORDER — CYANOCOBALAMIN 1000 MCG/ML IJ SOLN
1000.0000 ug | Freq: Once | INTRAMUSCULAR | Status: AC
  Administered 2019-06-14: 1000 ug via INTRAMUSCULAR

## 2019-06-14 NOTE — Assessment & Plan Note (Signed)
Chronic BP well controlled Current regimen effective and well tolerated Continue current medications at current doses

## 2019-06-14 NOTE — Assessment & Plan Note (Signed)
Chronic GERD controlled Continue daily medication

## 2019-06-14 NOTE — Assessment & Plan Note (Signed)
Chronic Following with the healthy weight management clinic She is actively losing weight She will restart regular exercise She will continue to work on her diet She is considering bariatric surgery

## 2019-06-14 NOTE — Assessment & Plan Note (Signed)
Lab Results  Component Value Date   HGBA1C 6.0 06/11/2019   Sugars increased slightly compared to three months ago Working on weight loss Eating low sugar/carb diet On metformin - continue

## 2019-06-14 NOTE — Assessment & Plan Note (Signed)
Chronic Lipids reviewed Controlled with lifestyle

## 2019-06-14 NOTE — Assessment & Plan Note (Signed)
Continue Valtrex

## 2019-06-14 NOTE — Assessment & Plan Note (Signed)
Chronic Currently on Paxil, but dose has been decreased to 10 mg daily and the healthy weight management clinic is working with her to decrease this further in hopes that it will help with weight loss So far she is doing well-has had some increased anxiety Currently on 10 mg and will decrease very slowly-plan to stay on 10 mg for at least 2 months and then 5 mg for at least 2 months She is doing several other things to help with her anxiety and will continue to do those things

## 2019-06-14 NOTE — Assessment & Plan Note (Signed)
Chronic Continue B12 injections monthly-1 will be given today

## 2019-06-14 NOTE — Assessment & Plan Note (Signed)
Chronic Causing increased pain Will eventually need surgery-may be combined with bariatric surgery in the near future

## 2019-06-14 NOTE — Assessment & Plan Note (Signed)
Chronic Continue vitamin D supplementation

## 2019-06-21 ENCOUNTER — Ambulatory Visit: Payer: BC Managed Care – PPO

## 2019-06-21 ENCOUNTER — Other Ambulatory Visit: Payer: Self-pay

## 2019-06-21 ENCOUNTER — Ambulatory Visit
Admission: RE | Admit: 2019-06-21 | Discharge: 2019-06-21 | Disposition: A | Discharge status: Home / Self Care | Payer: BC Managed Care – PPO | Source: Ambulatory Visit | Attending: Internal Medicine | Admitting: Internal Medicine

## 2019-06-21 DIAGNOSIS — Z1231 Encounter for screening mammogram for malignant neoplasm of breast: Secondary | ICD-10-CM

## 2019-06-26 ENCOUNTER — Encounter (INDEPENDENT_AMBULATORY_CARE_PROVIDER_SITE_OTHER): Payer: Self-pay | Admitting: Family Medicine

## 2019-06-26 ENCOUNTER — Other Ambulatory Visit: Payer: Self-pay

## 2019-06-26 ENCOUNTER — Ambulatory Visit (INDEPENDENT_AMBULATORY_CARE_PROVIDER_SITE_OTHER): Payer: BC Managed Care – PPO | Admitting: Family Medicine

## 2019-06-26 VITALS — BP 116/70 | HR 73 | Temp 97.9°F | Ht 65.0 in | Wt 362.0 lb

## 2019-06-26 DIAGNOSIS — Z9189 Other specified personal risk factors, not elsewhere classified: Secondary | ICD-10-CM | POA: Diagnosis not present

## 2019-06-26 DIAGNOSIS — E559 Vitamin D deficiency, unspecified: Secondary | ICD-10-CM | POA: Diagnosis not present

## 2019-06-26 DIAGNOSIS — I10 Essential (primary) hypertension: Secondary | ICD-10-CM

## 2019-06-26 DIAGNOSIS — F411 Generalized anxiety disorder: Secondary | ICD-10-CM

## 2019-06-26 DIAGNOSIS — Z6841 Body Mass Index (BMI) 40.0 and over, adult: Secondary | ICD-10-CM

## 2019-06-26 DIAGNOSIS — E66813 Obesity, class 3: Secondary | ICD-10-CM

## 2019-06-26 MED ORDER — VITAMIN D (ERGOCALCIFEROL) 1.25 MG (50000 UNIT) PO CAPS: ORAL_CAPSULE | ORAL | 0 refills | Status: DC

## 2019-06-26 MED ORDER — TOPIRAMATE 50 MG PO TABS: 50.0000 mg | ORAL_TABLET | Freq: Every day | ORAL | 0 refills | Status: DC

## 2019-06-26 MED ORDER — LOSARTAN POTASSIUM 25 MG PO TABS: 25.0000 mg | ORAL_TABLET | Freq: Every day | ORAL | 0 refills | Status: DC

## 2019-06-26 MED ORDER — PAROXETINE HCL 10 MG PO TABS: 10.0000 mg | ORAL_TABLET | Freq: Every day | ORAL | 0 refills | Status: DC

## 2019-06-26 NOTE — Progress Notes (Signed)
Chief Complaint:   OBESITY [name] is here to discuss her progress with her obesity treatment plan along with follow-up of her obesity related diagnoses. [name] is on following a lower carbohydrate, vegetable and lean protein rich diet plan and states she is following her eating plan approximately 70% of the time. [name] states she is exercising for 0 minutes 0 times per week.  Today's visit was #: 25 Starting weight: 368 lbs Starting date: 02/14/2018 Today's weight: 362 lbs Today's date: 06/26/2019 Total lbs lost to date: 6 lbs Total lbs lost since last in-office visit: 6 lbs  Interim History: [name] is having a hard time sticking to low carb due to missing fruit.  However, she has lost 6 pounds.  She reports she is not eating enough vegetables.  Subjective:   1. Vitamin D deficiency Vitamin D is at goal.  She is on prescription vitamin D.  2. Essential hypertension Well-controlled.  [name] denies chest pain or shortness of breath. BP Readings from Last 3 Encounters:  06/26/19 116/70  06/14/19 136/84  06/05/19 115/69    3. GAD (generalized anxiety disorder) [name] is on 10 mg of Paxil daily.  We are weaning off in preparation to start Lexapro.  She had serious side effects with an attempt to wean quickly in the past. She has notices some emotional lability with the lowered dose.  No suicidal or homicidal ideation.  Her mood is overall stable.  She says her stress eating is better controlled.  4. At risk for side effect of medication [name] is at risk for medication side effect due to weaning off of Paxil.  Assessment/Plan:   1. Vitamin D deficiency [name] agrees to continue her prescription vitamin D at the weekly dose.  She has been provided with a refill. - Vitamin D, Ergocalciferol, (DRISDOL) 1.25 MG (50000 UNIT) CAPS capsule; TAKE 1 CAPSULE BY MOUTH ONE TIME PER WEEK  Dispense: 2 capsule; Refill: 0  2. Essential hypertension [name] agrees to continue losartan at 25 mg daily.  A  refill has been sent to the pharmacy for her. - losartan (COZAAR) 25 MG tablet; Take 1 tablet (25 mg total) by mouth daily.  Dispense: 30 tablet; Refill: 0  3. GAD (generalized anxiety disorder) [name] agrees to continue Paxil at 10 mg daily for now.  She is also going to continue Topamax at 50 mg daily.  Both medications have been refilled.  She will continue to see Dr. Mallie Mussel. - PARoxetine (PAXIL) 10 MG tablet; Take 1 tablet (10 mg total) by mouth daily.  Dispense: 30 tablet; Refill: 0 - topiramate (TOPAMAX) 50 MG tablet; Take 1 tablet (50 mg total) by mouth daily.  Dispense: 30 tablet; Refill: 0  4. At risk for side effect of medication [name] was counseled on the risk of side effect of medication due to weaning off Paxil.  5. Class 3 severe obesity with serious comorbidity and body mass index (BMI) of 60.0 to 69.9 in adult, unspecified obesity type ([place]) [name] is currently in the action stage of change. As such, her goal is to continue with weight loss efforts. She has agreed to following a lower carbohydrate, vegetable and lean protein rich diet plan.   She may have 1 fruit per day and occasional [employer] or protein shake with breakfast.  Exercise goals: She is planning to start walking and gardening.  Behavioral modification strategies: decreasing simple carbohydrates, increasing vegetables, meal planning and cooking strategies, emotional eating strategies and planning for success.  [name] has agreed to follow-up with our clinic in 3 weeks. She was informed of the importance of frequent follow-up visits to maximize her success with intensive lifestyle modifications for her multiple health conditions.   Objective:   Blood pressure 116/70, pulse 73, temperature 97.9 F (36.6 C), temperature source Oral, height 5' 5"  (1.651 m), weight (!) 362 lb (164.2 kg), last menstrual period 06/19/2019, SpO2 100 %. Body mass index is 60.24 kg/m.  General: Cooperative, alert, well developed, in  no acute distress. HEENT: Conjunctivae and lids unremarkable. Cardiovascular: Regular rhythm.  Lungs: Normal work of breathing. Neurologic: No focal deficits.   Lab Results  Component Value Date   CREATININE 0.94 06/11/2019   BUN 19 06/11/2019   NA 137 06/11/2019   K 4.0 06/11/2019   CL 106 06/11/2019   CO2 22 06/11/2019   Lab Results  Component Value Date   ALT 14 06/11/2019   AST 7 06/11/2019   ALKPHOS 69 06/11/2019   BILITOT 0.3 06/11/2019   Lab Results  Component Value Date   HGBA1C 6.0 06/11/2019   HGBA1C 5.8 (H) 03/12/2019   HGBA1C 5.8 (H) 09/28/2018   HGBA1C 5.7 (H) 02/14/2018   HGBA1C 6.1 11/24/2017   Lab Results  Component Value Date   INSULIN 27.7 (H) 03/12/2019   INSULIN 2.0 (L) 09/28/2018   INSULIN 19.1 02/14/2018   Lab Results  Component Value Date   TSH 4.19 06/11/2019   Lab Results  Component Value Date   CHOL 177 06/11/2019   HDL 39.00 (L) 06/11/2019   LDLCALC 109 (H) 06/11/2019   LDLDIRECT 154.1 08/16/2012   TRIG 144.0 06/11/2019   CHOLHDL 5 06/11/2019   Lab Results  Component Value Date   WBC 10.1 06/11/2019   HGB 12.0 06/11/2019   HCT 36.1 06/11/2019   MCV 89.9 06/11/2019   PLT 323.0 06/11/2019   Attestation Statements:   Reviewed by clinician on day of visit: allergies, medications, problem list, medical history, surgical history, family history, social history, and previous encounter notes.  I, Water quality scientist, CMA, am acting as Location manager for [employer], FNP-C.  I have reviewed the above documentation for accuracy and completeness, and I agree with the above. -  Georgianne Fick, FNP

## 2019-06-27 ENCOUNTER — Encounter (INDEPENDENT_AMBULATORY_CARE_PROVIDER_SITE_OTHER): Payer: Self-pay | Admitting: Family Medicine

## 2019-06-27 ENCOUNTER — Ambulatory Visit (INDEPENDENT_AMBULATORY_CARE_PROVIDER_SITE_OTHER): Payer: BC Managed Care – PPO | Admitting: Psychology

## 2019-06-27 DIAGNOSIS — F3289 Other specified depressive episodes: Secondary | ICD-10-CM | POA: Diagnosis not present

## 2019-06-27 NOTE — Progress Notes (Signed)
Office: [phone]  /  Fax: [phone]    Date: June 27, 2019    Appointment Start Time: 2:36pm Duration: 22 minutes Provider: Glennie Isle, Psy.D. 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 The e-mail with the secure link was re-sent and assistance on connecting was provided. As such, today's appointment was initiated 6 minutes late. [name] is a 49 y.o. female presenting via [place] for a follow-up appointment to address the previously established treatment goal of decreasing emotional eating. Today's appointment was a telepsychological visit due to COVID-19. Chevonne provided verbal consent for today's telepsychological appointment and she is aware she is responsible for securing confidentiality on her end of the session. Prior to proceeding with today's appointment, [name]'s physical location at the time of this appointment was obtained as well a phone number she could be reached at in the event of technical difficulties. [name] and this provider participated in today's telepsychological service.   This provider conducted a brief check-in. [name] discussed doing well with her meal plan and denied episodes of emotional eating since the last appointment with this provider. She stated she is experiencing challenges (e.g., crying spells) with weaning off Paxil. She noted [employer], FNP-C and her PCP are aware of the aforementioned. Psychoeducation regarding the connection between thoughts, feelings, and behaviors was provided. In addition, psychoeducation regarding pleasurable activities, including its impact on emotional eating and overall well-being was provided. [name] was provided with a handout with various options of pleasurable activities, and was encouraged to engage in one  activity a day and additional activities as needed when triggered to emotionally eat. [name] agreed. [name] provided verbal consent during today's appointment for this provider to send a handout with pleasurable activities via e-mail. Furthermore, termination planning was discussed. [name] was receptive to a follow-up appointment in 3-4 weeks and an additional follow-up/termination appointment in 3-4 weeks after that. Overall, [name] was receptive to today's appointment as evidenced by openness to sharing, responsiveness to feedback, and willingness to engage in pleasurable activities to assist with coping.  Mental Status Examination:  Appearance: well groomed and appropriate hygiene  Behavior: appropriate to circumstances Mood: euthymic Affect: mood congruent Speech: normal in rate, volume, and tone Eye Contact: appropriate Psychomotor Activity: appropriate Gait: unable to assess Thought Process: linear, logical, and goal directed  Thought Content/Perception: no hallucinations, delusions, bizarre thinking or behavior reported or observed and no evidence of suicidal and homicidal ideation, plan, and intent Orientation: time, person, place and purpose of appointment Memory/Concentration: memory, attention, language, and fund of knowledge intact  Insight/Judgment: good  Interventions:  Conducted a brief chart review Provided empathic reflections and validation Employed supportive psychotherapy interventions to facilitate reduced distress and to improve coping skills with identified stressors Employed motivational interviewing skills to assess patient's willingness/desire to adhere to recommended medical treatments and assignments Psychoeducation provided regarding the connection between thoughts, feelings, and behaviors Psychoeducation provided regarding pleasurable activities Discussed termination planning  DSM-5 Diagnosis(es): 311 (F32.8) Other Specified Depressive Disorder, Emotional Eating  Behaviors  Treatment Goal & Progress: During the initial appointment with this provider, the following treatment goal was established: decrease emotional eating. [name] has demonstrated progress in her goal as evidenced by increased awareness of hunger patterns, increased awareness of triggers for emotional eating and reduction in emotional eating. [name] also demonstrates willingness to engage in  pleasurable activities.  Plan: The next appointment will be scheduled in three weeks, which will be via [employer]. The next session will focus on working towards the established treatment goal.

## 2019-07-04 NOTE — Progress Notes (Signed)
  Office: [phone]  /  Fax: [phone]    Date: July 18, 2019   Appointment Start Time: 2:33pm Duration: 24 minutes Provider: Glennie Isle, Psy.D. 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 female presenting via [place] for a follow-up appointment to address the previously established treatment goal of decreasing emotional eating. Today's appointment was a telepsychological visit due to COVID-19. [name] provided verbal consent for today's telepsychological appointment and she is aware she is responsible for securing confidentiality on her end of the session. Prior to proceeding with today's appointment, [name]'s physical location at the time of this appointment was obtained as well a phone number she could be reached at in the event of technical difficulties. [name] and this provider participated in today's telepsychological service.   This provider conducted a brief check-in. [name] reported, "I was doing great until last week and then I bottomed out." This was further explored. She discussed meeting with her doctor for bariatric surgery, noting, "It started making me second guess myself." She acknowledged the aforementioned resulted in her deviating from her meal plan for two days. [name] reported she discussed the aforementioned with [facility], FNP-C. This provider recommended attending [facility]. She was receptive and asked this provider to e-mail her the link. In addition, psychoeducation regarding grounding techniques was provided and she was engaged in an exercise involving her senses. [name] reported feeling relaxed and asked, "How did we just do that?" She described not feeling "heavy" after the exercise. [name] provided verbal consent during today's appointment for this provider to send a handout for today's  exercise via e-mail. [name] was receptive to today's appointment as evidenced by openness to sharing, responsiveness to feedback, and willingness to engage in the grounding technique.  Mental Status Examination:  Appearance: well groomed and appropriate hygiene  Behavior: appropriate to circumstances Mood: anxious Affect: mood congruent Speech: normal in rate, volume, and tone Eye Contact: appropriate Psychomotor Activity: appropriate Gait: unable to assess Thought Process: linear, logical, and goal directed  Thought Content/Perception: no hallucinations, delusions, bizarre thinking or behavior reported or observed and no evidence of suicidal and homicidal ideation, plan, and intent Orientation: time, person, place and purpose of appointment Memory/Concentration: memory, attention, language, and fund of knowledge intact  Insight/Judgment: good  Interventions:  Conducted a brief chart review Provided empathic reflections and validation Employed supportive psychotherapy interventions to facilitate reduced distress and to improve coping skills with identified stressors Employed motivational interviewing skills to assess patient's willingness/desire to adhere to recommended medical treatments and assignments Psychoeducation provided regarding grounding exercises Engaged patient in a grounding exercise  DSM-5 Diagnosis(es): 311 (F32.8) Other Specified Depressive Disorder, Emotional Eating Behaviors  Treatment Goal & Progress: During the initial appointment with this provider, the following treatment goal was established: decrease emotional eating. [name] has demonstrated progress in her goal as evidenced by increased awareness of hunger patterns and increased awareness of triggers for emotional eating. [name] also continues to demonstrate willingness to engage in learned skill(s).  Plan: The next appointment will be scheduled in one month, which will be via MyChart Video Visit. The next session  will focus on termination.

## 2019-07-12 ENCOUNTER — Ambulatory Visit: Payer: BC Managed Care – PPO

## 2019-07-17 ENCOUNTER — Ambulatory Visit (INDEPENDENT_AMBULATORY_CARE_PROVIDER_SITE_OTHER): Payer: BC Managed Care – PPO | Admitting: Family Medicine

## 2019-07-17 ENCOUNTER — Other Ambulatory Visit: Payer: Self-pay

## 2019-07-17 ENCOUNTER — Encounter (INDEPENDENT_AMBULATORY_CARE_PROVIDER_SITE_OTHER): Payer: Self-pay | Admitting: Family Medicine

## 2019-07-17 VITALS — BP 136/82 | HR 66 | Temp 97.6°F | Ht 65.0 in | Wt 362.0 lb

## 2019-07-17 DIAGNOSIS — F411 Generalized anxiety disorder: Secondary | ICD-10-CM | POA: Diagnosis not present

## 2019-07-17 DIAGNOSIS — R7989 Other specified abnormal findings of blood chemistry: Secondary | ICD-10-CM | POA: Diagnosis not present

## 2019-07-17 DIAGNOSIS — Z9189 Other specified personal risk factors, not elsewhere classified: Secondary | ICD-10-CM

## 2019-07-17 DIAGNOSIS — E559 Vitamin D deficiency, unspecified: Secondary | ICD-10-CM

## 2019-07-17 DIAGNOSIS — Z6841 Body Mass Index (BMI) 40.0 and over, adult: Secondary | ICD-10-CM

## 2019-07-17 DIAGNOSIS — R7303 Prediabetes: Secondary | ICD-10-CM

## 2019-07-17 MED ORDER — VITAMIN D (ERGOCALCIFEROL) 1.25 MG (50000 UNIT) PO CAPS: ORAL_CAPSULE | ORAL | 0 refills | Status: DC

## 2019-07-17 NOTE — Progress Notes (Signed)
Chief Complaint:   OBESITY [name] is here to discuss her progress with her obesity treatment plan along with follow-up of her obesity related diagnoses. Berthe is on following a lower carbohydrate, vegetable and lean protein rich diet plan and states she is following her eating plan approximately 90% of the time. Amaiya states she is walking for 15 minutes 2 times per week.  Today's visit was #: 26 Starting weight: 368 lbs Starting date: 02/14/2018 Today's weight: 362 lbs Today's date: 07/17/2019 Total lbs lost to date: 6 Total lbs lost since last in-office visit: 0  Interim History: [name] recently met with Dr. Evorn Gong [facility]), and she feels quite overwhelmed with the whole process. She was scheduled for appointments with the dietician and counselor. This has caused her to be off the plan due to stress. She is now getting back on the plan. She is aware she must quit smoking and lose weight to qualify for bariatric surgery.   Subjective:   1. GAD (generalized anxiety disorder) Keiasia is meeting with our Psychologist and an outside Psychologist, and she feels this is quite beneficial. She had been resistant to counseling initially when it was recommended. She reports her anxiety is better overall.  2. Elevated TSH [name]'s TSH has been trending upward. Last TSH was 4.19.  3. Vitamin D deficiency [name]'s last Vit D was low at 28.6. She is on weekly prescription Vit D.  4. Pre-diabetes [name]'s last A1c was increased to 6.0 on 06/11/2019, worsening. She is on metformin BID and she denies polyphagia. I discussed labs with the patient today.  5. At risk for diabetes mellitus [name] is at higher than average risk for developing diabetes due to her obesity.   Assessment/Plan:   1. GAD (generalized anxiety disorder) Behavior modification techniques were discussed today to help [name] deal with her anxiety. [name] will continue to met with her counselors, and will continue Paxil at 10 mg  and Topamax. Orders and follow up as documented in patient record.   2. Elevated TSH We will check thyroid panel today and will follow up.  - T3 - T4, free - TSH  3. Vitamin D deficiency Low Vitamin D level contributes to fatigue and are associated with obesity, breast, and colon cancer. We will refill prescription Vitamin D for 1 month. Huldah will follow-up for routine testing of Vitamin D, at least 2-3 times per year to avoid over-replacement. We will check labs today.  - VITAMIN D 25 Hydroxy (Vit-D Deficiency, Fractures)  - Vitamin D, Ergocalciferol, (DRISDOL) 1.25 MG (50000 UNIT) CAPS capsule; TAKE 1 CAPSULE BY MOUTH ONE TIME PER WEEK  Dispense: 2 capsule; Refill: 0  4. Pre-diabetes [name] will continue to work on weight loss, exercise, and decreasing simple carbohydrates to help decrease the risk of diabetes. We will check labs today.  - Insulin, random  5. At risk for diabetes mellitus [name] was given approximately 15 minutes of diabetes education and counseling today. We discussed intensive lifestyle modifications today with an emphasis on weight loss as well as increasing exercise and decreasing simple carbohydrates in her diet. We also reviewed medication options with an emphasis on risk versus benefit of those discussed.   Repetitive spaced learning was employed today to elicit superior memory formation and behavioral change.  6. Class 3 severe obesity with serious comorbidity and body mass index (BMI) of 60.0 to 69.9 in adult, unspecified obesity type ([place]) [name] is currently in the action stage of change. As such, her  goal is to continue with weight loss efforts. She has agreed to following a lower carbohydrate, vegetable and lean protein rich diet plan.   [name] may have 1 fruit serving a day and occasional [name] for breakfast.  Exercise goals: Increase walking.   Behavioral modification strategies: decreasing simple carbohydrates.  [name] has agreed to follow-up  with our clinic in 3 weeks. She was informed of the importance of frequent follow-up visits to maximize her success with intensive lifestyle modifications for her multiple health conditions.   [name] was informed we would discuss her lab results at her next visit unless there is a critical issue that needs to be addressed sooner. [name] agreed to keep her next visit at the agreed upon time to discuss these results.  Objective:   Blood pressure 136/82, pulse 66, temperature 97.6 F (36.4 C), temperature source Oral, height _0  (1.651 m), weight (!) 362 lb (164.2 kg), last menstrual period 06/19/2019, SpO2 99 %. Body mass index is 60.24 kg/m.  General: Cooperative, alert, well developed, in no acute distress. HEENT: Conjunctivae and lids unremarkable. Cardiovascular: Regular rhythm.  Lungs: Normal work of breathing. Neurologic: No focal deficits.   Lab Results  Component Value Date   CREATININE 0.94 06/11/2019   BUN 19 06/11/2019   NA 137 06/11/2019   K 4.0 06/11/2019   CL 106 06/11/2019   CO2 22 06/11/2019   Lab Results  Component Value Date   ALT 14 06/11/2019   AST 7 06/11/2019   ALKPHOS 69 06/11/2019   BILITOT 0.3 06/11/2019   Lab Results  Component Value Date   HGBA1C 6.0 06/11/2019   HGBA1C 5.8 (H) 03/12/2019   HGBA1C 5.8 (H) 09/28/2018   HGBA1C 5.7 (H) 02/14/2018   HGBA1C 6.1 11/24/2017   Lab Results  Component Value Date   INSULIN 27.7 (H) 03/12/2019   INSULIN 2.0 (L) 09/28/2018   INSULIN 19.1 02/14/2018   Lab Results  Component Value Date   TSH 4.19 06/11/2019   Lab Results  Component Value Date   CHOL 177 06/11/2019   HDL 39.00 (L) 06/11/2019   LDLCALC 109 (H) 06/11/2019   LDLDIRECT 154.1 08/16/2012   TRIG 144.0 06/11/2019   CHOLHDL 5 06/11/2019   Lab Results  Component Value Date   WBC 10.1 06/11/2019   HGB 12.0 06/11/2019   HCT 36.1 06/11/2019   MCV 89.9 06/11/2019   PLT 323.0 06/11/2019   No results found for: IRON, TIBC,  FERRITIN  Attestation Statements:   Reviewed by clinician on day of visit: allergies, medications, problem list, medical history, surgical history, family history, social history, and previous encounter notes.   Wilhemena Durie, am acting as Location manager for [name] [employer], FNP-C.  I have reviewed the above documentation for accuracy and completeness, and I agree with the above. -  Georgianne Fick, FNP

## 2019-07-18 ENCOUNTER — Ambulatory Visit (INDEPENDENT_AMBULATORY_CARE_PROVIDER_SITE_OTHER): Payer: BC Managed Care – PPO | Admitting: Psychology

## 2019-07-18 DIAGNOSIS — F3289 Other specified depressive episodes: Secondary | ICD-10-CM

## 2019-07-19 ENCOUNTER — Ambulatory Visit (INDEPENDENT_AMBULATORY_CARE_PROVIDER_SITE_OTHER): Payer: BC Managed Care – PPO | Admitting: *Deleted

## 2019-07-19 ENCOUNTER — Encounter: Payer: Self-pay | Admitting: Internal Medicine

## 2019-07-19 ENCOUNTER — Other Ambulatory Visit: Payer: Self-pay

## 2019-07-19 DIAGNOSIS — E538 Deficiency of other specified B group vitamins: Secondary | ICD-10-CM

## 2019-07-19 MED ORDER — CYANOCOBALAMIN 1000 MCG/ML IJ SOLN
1000.0000 ug | Freq: Once | INTRAMUSCULAR | Status: AC
  Administered 2019-07-19: 1000 ug via INTRAMUSCULAR

## 2019-07-19 NOTE — Progress Notes (Signed)
Pls cosign for B12 inj.Marland KitchenJohny [name]

## 2019-07-23 LAB — TSH: TSH: 2.14 u[IU]/mL (ref 0.450–4.500)

## 2019-07-23 LAB — INSULIN, RANDOM: INSULIN: 38 u[IU]/mL — ABNORMAL HIGH (ref 2.6–24.9)

## 2019-07-23 LAB — T4, FREE: Free T4: 0.84 ng/dL (ref 0.82–1.77)

## 2019-07-23 LAB — VITAMIN D 25 HYDROXY (VIT D DEFICIENCY, FRACTURES): Vit D, 25-Hydroxy: 47.7 ng/mL (ref 30.0–100.0)

## 2019-07-23 LAB — T3: T3, Total: 104 ng/dL (ref 71–180)

## 2019-07-29 ENCOUNTER — Ambulatory Visit (INDEPENDENT_AMBULATORY_CARE_PROVIDER_SITE_OTHER): Payer: BC Managed Care – PPO | Admitting: Psychology

## 2019-07-29 DIAGNOSIS — F411 Generalized anxiety disorder: Secondary | ICD-10-CM | POA: Diagnosis not present

## 2019-08-04 NOTE — Progress Notes (Signed)
  Office: [phone]  /  Fax: [phone]    Date: Aug 15, 2019   Appointment Start Time: 2:30pm Duration: 22 minutes Provider: Glennie Isle, Psy.D. 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 female presenting via MyChart Video Visit for a follow-up appointment to address the previously established treatment goal of decreasing emotional eating. Today's appointment was a telepsychological visit due to COVID-19. Jamyria provided verbal consent for today's telepsychological appointment and she is aware she is responsible for securing confidentiality on her end of the session. Prior to proceeding with today's appointment, Gregoria's physical location at the time of this appointment was obtained as well a phone number she could be reached at in the event of technical difficulties. Dariann and this provider participated in today's telepsychological service.   Aiesha acknowledged understanding that for today's appointment and future telepsychological appointments, MyChart Video Visit will be utilized. She also verbally acknowledged understanding that the information outlined in the telepsychological informed consent form signed at the onset of treatment would still be applicable despite the change in the videoconferencing platform.   This provider conducted a brief check-in. Shalia reported, "Everything's been going okay." She noted she met with her new mental health provider Myra Gianotti, [place]). She also shared about recent stressors. Associated thoughts and feelings were processed. Session focused on reviewing success (e.g., recognizing when she is full, engaging in portion control, coping with anxiety so that it does not trigger eating) to date as it relates to eating habits. Sumayyah was receptive to today's appointment as evidenced by openness to sharing,  responsiveness to feedback, and willingness to continue engaging in learned skills.  Mental Status Examination:  Appearance: well groomed and appropriate hygiene  Behavior: appropriate to circumstances Mood: euthymic Affect: mood congruent Speech: normal in rate, volume, and tone Eye Contact: appropriate Psychomotor Activity: appropriate Gait: unable to assess Thought Process: linear, logical, and goal directed  Thought Content/Perception: no hallucinations, delusions, bizarre thinking or behavior reported or observed and no evidence of suicidal and homicidal ideation, plan, and intent Orientation: time, person, place and purpose of appointment Memory/Concentration: memory, attention, language, and fund of knowledge intact  Insight/Judgment: good  Interventions:  Conducted a brief chart review Provided empathic reflections and validation Employed supportive psychotherapy interventions to facilitate reduced distress and to improve coping skills with identified stressors Employed motivational interviewing skills to assess patient's willingness/desire to adhere to recommended medical treatments and assignments  DSM-5 Diagnosis(es): 311 (F32.8) Other Specified Depressive Disorder, Emotional Eating Behaviors  Treatment Goal & Progress: During the initial appointment with this provider, the following treatment goal was established: decrease emotional eating. Annalysia demonstrated progress in her goal as evidenced by increased awareness of hunger patterns, increased awareness of triggers for emotional eating and reduction in emotional eating. Ligaya also continues to demonstrate willingness to engage in learned skill(s).  Plan: As previously planned, today was Davina's last appointment with this provider. She acknowledged understanding that she may request a follow-up appointment with this provider in the future as long as she is still established with the clinic. [place]'s next appointment with Myra Gianotti, LCSW is on Monday. No further follow-up planned by this provider.

## 2019-08-08 ENCOUNTER — Encounter (INDEPENDENT_AMBULATORY_CARE_PROVIDER_SITE_OTHER): Payer: Self-pay | Admitting: Family Medicine

## 2019-08-08 ENCOUNTER — Other Ambulatory Visit: Payer: Self-pay

## 2019-08-08 ENCOUNTER — Ambulatory Visit (INDEPENDENT_AMBULATORY_CARE_PROVIDER_SITE_OTHER): Payer: BC Managed Care – PPO | Admitting: Family Medicine

## 2019-08-08 VITALS — BP 111/65 | HR 78 | Temp 97.7°F | Ht 65.0 in | Wt 356.0 lb

## 2019-08-08 DIAGNOSIS — E559 Vitamin D deficiency, unspecified: Secondary | ICD-10-CM

## 2019-08-08 DIAGNOSIS — R7303 Prediabetes: Secondary | ICD-10-CM

## 2019-08-08 DIAGNOSIS — Z9189 Other specified personal risk factors, not elsewhere classified: Secondary | ICD-10-CM

## 2019-08-08 DIAGNOSIS — F411 Generalized anxiety disorder: Secondary | ICD-10-CM | POA: Diagnosis not present

## 2019-08-08 DIAGNOSIS — I10 Essential (primary) hypertension: Secondary | ICD-10-CM | POA: Diagnosis not present

## 2019-08-08 DIAGNOSIS — Z6841 Body Mass Index (BMI) 40.0 and over, adult: Secondary | ICD-10-CM

## 2019-08-08 MED ORDER — LOSARTAN POTASSIUM 25 MG PO TABS: 25.0000 mg | ORAL_TABLET | Freq: Every day | ORAL | 0 refills | Status: DC

## 2019-08-08 MED ORDER — METFORMIN HCL 500 MG PO TABS: 500.0000 mg | ORAL_TABLET | Freq: Two times a day (BID) | ORAL | 0 refills | Status: DC

## 2019-08-08 MED ORDER — VITAMIN D (ERGOCALCIFEROL) 1.25 MG (50000 UNIT) PO CAPS: ORAL_CAPSULE | ORAL | 0 refills | Status: DC

## 2019-08-08 MED ORDER — TOPIRAMATE 50 MG PO TABS: 50.0000 mg | ORAL_TABLET | Freq: Every day | ORAL | 0 refills | Status: DC

## 2019-08-08 MED ORDER — PAROXETINE HCL 10 MG PO TABS: 10.0000 mg | ORAL_TABLET | Freq: Every day | ORAL | 0 refills | Status: DC

## 2019-08-08 NOTE — Progress Notes (Signed)
Chief Complaint:   OBESITY [name] is here to discuss her progress with her obesity treatment plan along with follow-up of her obesity related diagnoses. [name] is on following a lower carbohydrate, vegetable and lean protein rich diet plan and states she is following her eating plan approximately 90% of the time. [name] states she is doing 0 minutes 0 times per week.  Today's visit was #: 27 Starting weight: 368 lbs Starting date: 02/14/2018 Today's weight: 356 lbs Today's date: 08/08/2019 Total lbs lost to date: 12 Total lbs lost since last in-office visit: 6  Interim History: [name] has cancelled prep appointments for bariatric surgery and she feels less overwhelmed. She will get back to this process after losing some weight. She is not drinking any sugar sweetened beverages. She she has cut out artifical sweeteners.  Subjective:   1. GAD (generalized anxiety disorder) [name] is seeing a counselor every other week and then Dr. Mallie Mussel every 3 weeks. We are trying to wean her off of Paxil. She reports being  less anxious. She is also on Topamax 50 mg. She notes stress eating is better controlled. She notes Topamax is also helping pseudotumor cerebri per her Ophthalmologist.  2. Vitamin D deficiency [name]'s Vit D level is nearly at goal (47.7). she is on prescription Vit D.  3. Essential hypertension [name]'s blood pressure is well controlled on losartan. She denies chest pain or shortness of breath. BP Readings from Last 3 Encounters:  08/08/19 111/65  07/17/19 136/82  06/26/19 116/70     4. Pre-diabetes [name] denies polyphagia or nausea, but she notes diarrhea. Her fasting insulin has increased to 38 from 27.7.  5. At risk for diabetes mellitus [name] is at higher than average risk for developing diabetes due to her obesity.   Assessment/Plan:   1. GAD (generalized anxiety disorder) Behavior modification techniques were discussed today to help [name] deal with her anxiety. We will  refill Paxil and Topamax for 1 month. Orders and follow up as documented in patient record.   - PARoxetine (PAXIL) 10 MG tablet; Take 1 tablet (10 mg total) by mouth daily.  Dispense: 30 tablet; Refill: 0 - topiramate (TOPAMAX) 50 MG tablet; Take 1 tablet (50 mg total) by mouth daily.  Dispense: 30 tablet; Refill: 0  2. Vitamin D deficiency Low Vitamin D level contributes to fatigue and are associated with obesity, breast, and colon cancer. We will refill prescription Vitamin D for 1 month. [name] will follow-up for routine testing of Vitamin D, at least 2-3 times per year to avoid over-replacement.  - Vitamin D, Ergocalciferol, (DRISDOL) 1.25 MG (50000 UNIT) CAPS capsule; TAKE 1 CAPSULE BY MOUTH ONE TIME PER WEEK  Dispense: 4 capsule; Refill: 0  3. Essential hypertension [name] is working on healthy weight loss and exercise to improve blood pressure control. We will watch for signs of hypotension as she continues her lifestyle modifications. We will refill losartan for 1 month.  - losartan (COZAAR) 25 MG tablet; Take 1 tablet (25 mg total) by mouth daily.  Dispense: 30 tablet; Refill: 0  4. Pre-diabetes [name] will continue to work on weight loss, exercise, and decreasing simple carbohydrates to help decrease the risk of diabetes. We will refill metformin for 1 month.  - metFORMIN (GLUCOPHAGE) 500 MG tablet; Take 1 tablet (500 mg total) by mouth 2 (two) times daily with a meal.  Dispense: 60 tablet; Refill: 0  5. At risk for diabetes mellitus [name] was given approximately 15 minutes of diabetes  education and counseling today. We discussed intensive lifestyle modifications today with an emphasis on weight loss as well as increasing exercise and decreasing simple carbohydrates in her diet. We also reviewed medication options with an emphasis on risk versus benefit of those discussed.   Repetitive spaced learning was employed today to elicit superior memory formation and behavioral change.  6. Class  3 severe obesity with serious comorbidity and body mass index (BMI) of 50.0 to 59.9 in adult, unspecified obesity type ([place]) Vondell is currently in the action stage of change. As such, her goal is to continue with weight loss efforts. She has agreed to following a lower carbohydrate, vegetable and lean protein rich diet plan.   [name] will have 1 fruit per day, 1 [employer] sandwich or shake per day.  Exercise goals: No exercise has been prescribed at this time.  Behavioral modification strategies: increasing lean protein intake and decreasing simple carbohydrates.  [name] has agreed to follow-up with our clinic in 3 weeks. She was informed of the importance of frequent follow-up visits to maximize her success with intensive lifestyle modifications for her multiple health conditions.   Objective:   Blood pressure 111/65, pulse 78, temperature 97.7 F (36.5 C), temperature source Oral, height 5' 5"  (1.651 m), weight (!) 356 lb (161.5 kg), last menstrual period 07/08/2019, SpO2 96 %. Body mass index is 59.24 kg/m.  General: Cooperative, alert, well developed, in no acute distress. HEENT: Conjunctivae and lids unremarkable. Cardiovascular: Regular rhythm.  Lungs: Normal work of breathing. Neurologic: No focal deficits.   Lab Results  Component Value Date   CREATININE 0.94 06/11/2019   BUN 19 06/11/2019   NA 137 06/11/2019   K 4.0 06/11/2019   CL 106 06/11/2019   CO2 22 06/11/2019   Lab Results  Component Value Date   ALT 14 06/11/2019   AST 7 06/11/2019   ALKPHOS 69 06/11/2019   BILITOT 0.3 06/11/2019   Lab Results  Component Value Date   HGBA1C 6.0 06/11/2019   HGBA1C 5.8 (H) 03/12/2019   HGBA1C 5.8 (H) 09/28/2018   HGBA1C 5.7 (H) 02/14/2018   HGBA1C 6.1 11/24/2017   Lab Results  Component Value Date   INSULIN 38.0 (H) 07/19/2019   INSULIN 27.7 (H) 03/12/2019   INSULIN 2.0 (L) 09/28/2018   INSULIN 19.1 02/14/2018   Lab Results  Component Value Date   TSH 2.140  07/19/2019   Lab Results  Component Value Date   CHOL 177 06/11/2019   HDL 39.00 (L) 06/11/2019   LDLCALC 109 (H) 06/11/2019   LDLDIRECT 154.1 08/16/2012   TRIG 144.0 06/11/2019   CHOLHDL 5 06/11/2019   Lab Results  Component Value Date   WBC 10.1 06/11/2019   HGB 12.0 06/11/2019   HCT 36.1 06/11/2019   MCV 89.9 06/11/2019   PLT 323.0 06/11/2019   No results found for: IRON, TIBC, FERRITIN  Attestation Statements:   Reviewed by clinician on day of visit: allergies, medications, problem list, medical history, surgical history, family history, social history, and previous encounter notes.   Wilhemena Durie, am acting as Location manager for [employer], FNP-C.  I have reviewed the above documentation for accuracy and completeness, and I agree with the above. -  Georgianne Fick, FNP

## 2019-08-13 ENCOUNTER — Ambulatory Visit (INDEPENDENT_AMBULATORY_CARE_PROVIDER_SITE_OTHER): Payer: BC Managed Care – PPO | Admitting: Psychology

## 2019-08-13 DIAGNOSIS — F411 Generalized anxiety disorder: Secondary | ICD-10-CM | POA: Diagnosis not present

## 2019-08-15 ENCOUNTER — Other Ambulatory Visit: Payer: Self-pay

## 2019-08-15 ENCOUNTER — Telehealth (INDEPENDENT_AMBULATORY_CARE_PROVIDER_SITE_OTHER): Payer: BC Managed Care – PPO | Admitting: Psychology

## 2019-08-15 DIAGNOSIS — F3289 Other specified depressive episodes: Secondary | ICD-10-CM

## 2019-08-16 ENCOUNTER — Other Ambulatory Visit: Payer: Self-pay

## 2019-08-16 ENCOUNTER — Ambulatory Visit (INDEPENDENT_AMBULATORY_CARE_PROVIDER_SITE_OTHER): Payer: BC Managed Care – PPO | Admitting: *Deleted

## 2019-08-16 DIAGNOSIS — E538 Deficiency of other specified B group vitamins: Secondary | ICD-10-CM

## 2019-08-16 MED ORDER — CYANOCOBALAMIN 1000 MCG/ML IJ SOLN
1000.0000 ug | Freq: Once | INTRAMUSCULAR | Status: AC
  Administered 2019-08-16: 1000 ug via INTRAMUSCULAR

## 2019-08-16 NOTE — Progress Notes (Signed)
Pls cosign for B12 inj.Marland KitchenJohny [name]

## 2019-08-19 ENCOUNTER — Ambulatory Visit (INDEPENDENT_AMBULATORY_CARE_PROVIDER_SITE_OTHER): Payer: BC Managed Care – PPO | Admitting: Psychology

## 2019-08-19 DIAGNOSIS — F411 Generalized anxiety disorder: Secondary | ICD-10-CM | POA: Diagnosis not present

## 2019-08-28 ENCOUNTER — Ambulatory Visit (INDEPENDENT_AMBULATORY_CARE_PROVIDER_SITE_OTHER): Payer: BC Managed Care – PPO | Admitting: Family Medicine

## 2019-08-28 ENCOUNTER — Encounter (INDEPENDENT_AMBULATORY_CARE_PROVIDER_SITE_OTHER): Payer: Self-pay | Admitting: Family Medicine

## 2019-08-28 ENCOUNTER — Other Ambulatory Visit: Payer: Self-pay

## 2019-08-28 VITALS — BP 107/69 | HR 67 | Temp 97.6°F | Ht 65.0 in | Wt 358.0 lb

## 2019-08-28 DIAGNOSIS — Z6841 Body Mass Index (BMI) 40.0 and over, adult: Secondary | ICD-10-CM

## 2019-08-28 DIAGNOSIS — Z9189 Other specified personal risk factors, not elsewhere classified: Secondary | ICD-10-CM

## 2019-08-28 DIAGNOSIS — F411 Generalized anxiety disorder: Secondary | ICD-10-CM | POA: Diagnosis not present

## 2019-08-28 DIAGNOSIS — I10 Essential (primary) hypertension: Secondary | ICD-10-CM | POA: Diagnosis not present

## 2019-08-28 DIAGNOSIS — E559 Vitamin D deficiency, unspecified: Secondary | ICD-10-CM | POA: Diagnosis not present

## 2019-08-28 MED ORDER — LOSARTAN POTASSIUM 25 MG PO TABS: 25.0000 mg | ORAL_TABLET | Freq: Every day | ORAL | 0 refills | Status: DC

## 2019-08-28 MED ORDER — PAROXETINE HCL 10 MG PO TABS: 10.0000 mg | ORAL_TABLET | Freq: Every day | ORAL | 0 refills | Status: DC

## 2019-08-28 MED ORDER — VITAMIN D (ERGOCALCIFEROL) 1.25 MG (50000 UNIT) PO CAPS: ORAL_CAPSULE | ORAL | 0 refills | Status: DC

## 2019-08-28 MED ORDER — TOPIRAMATE 50 MG PO TABS: 50.0000 mg | ORAL_TABLET | Freq: Every day | ORAL | 0 refills | Status: DC

## 2019-08-28 NOTE — Progress Notes (Signed)
Chief Complaint:   OBESITY [name] is here to discuss her progress with her obesity treatment plan along with follow-up of her obesity related diagnoses. [name] is on following a lower carbohydrate, vegetable and lean protein rich diet plan and states she is following her eating plan approximately 70% of the time. [name] states she is doing 0 minutes 0 times per week.  Today's visit was #: 28 Starting weight: 368 lbs Starting date: 02/14/2018 Today's weight: 358 lbs Today's date: 08/28/2019 Total lbs lost to date: 10 Total lbs lost since last in-office visit: 0  Interim History: [name] admits to being off the plan due to increased work hours. She is working 6 days per week. She has eaten some fast food but she is trying  To make good choices. She is not drinking sugar sweetened beverages.  Subjective:   1. Vitamin D deficiency [name]'s last Vit D level was nearly at goal at 47.7. She is on prescription Vit D.  2. Essential hypertension [name]'s blood pressure is well controlled on metoprolol and losartan. Cardiovascular ROS: no chest pain or dyspnea on exertion.  BP Readings from Last 3 Encounters:  08/28/19 107/69  08/08/19 111/65  07/17/19 136/82   Lab Results  Component Value Date   CREATININE 0.94 06/11/2019   CREATININE 0.92 03/12/2019   CREATININE 0.97 09/12/2018   3. GAD (generalized anxiety disorder) [name] notes her mood is stable and her cravings are controlled. She is on Paxil and Topamax. We are weaning her off the Paxil and she is currently taking 7.5 mg daily. She has a script for Xanax but she takes it 1-2 times per year. She sees Dr. Mallie Mussel and another outside counselor every 1-2 weeks.  4. At risk for depression [name] is at elevated risk of depression due to anxiety.  Assessment/Plan:   1. Vitamin D deficiency Low Vitamin D level contributes to fatigue and are associated with obesity, breast, and colon cancer. We will refill prescription Vitamin D for 1 month. [name]  will follow-up for routine testing of Vitamin D, at least 2-3 times per year to avoid over-replacement.  - Vitamin D, Ergocalciferol, (DRISDOL) 1.25 MG (50000 UNIT) CAPS capsule; TAKE 1 CAPSULE BY MOUTH ONE TIME PER WEEK  Dispense: 4 capsule; Refill: 0  2. Essential hypertension [name] is working on healthy weight loss and exercise to improve blood pressure control. We will watch for signs of hypotension as she continues her lifestyle modifications. We will refill losartan for 1 month.  - losartan (COZAAR) 25 MG tablet; Take 1 tablet (25 mg total) by mouth daily.  Dispense: 30 tablet; Refill: 0  3. GAD (generalized anxiety disorder) Behavior modification techniques were discussed today to help [name] deal with her anxiety. [name] will continue Paxil at 7.5 mg and we will refill Paxil and Topamax for 1 month. Orders and follow up as documented in patient record.   - PARoxetine (PAXIL) 10 MG tablet; Take 1 tablet (10 mg total) by mouth daily.  Dispense: 30 tablet; Refill: 0 - topiramate (TOPAMAX) 50 MG tablet; Take 1 tablet (50 mg total) by mouth daily.  Dispense: 30 tablet; Refill: 0  4. At risk for depression [name] was given approximately 15 minutes of depression risk counseling today. She has risk factors for depression including anxiety. We discussed the importance of a healthy work life balance, a healthy relationship with food and a good support system.  Repetitive spaced learning was employed today to elicit superior memory formation and behavioral change.  5. Class 3 severe obesity with serious comorbidity and body mass index (BMI) of 50.0 to 59.9 in adult, unspecified obesity type ([name]) [name] is currently in the action stage of change. As such, her goal is to continue with weight loss efforts. She has agreed to following a lower carbohydrate, vegetable and lean protein rich diet plan.   [name] may have [name] for breakfast with 1 piece of fruit per day.  Exercise goals: All  adults should avoid inactivity. Some physical activity is better than none, and adults who participate in any amount of physical activity gain some health benefits.  Behavioral modification strategies: decreasing simple carbohydrates.  [name] has agreed to follow-up with our clinic in 3 weeks. She was informed of the importance of frequent follow-up visits to maximize her success with intensive lifestyle modifications for her multiple health conditions.   Objective:   Blood pressure 107/69, pulse 67, temperature 97.6 F (36.4 C), temperature source Oral, height 5' 5"  (1.651 m), weight (!) 358 lb (162.4 kg), SpO2 98 %. Body mass index is 59.57 kg/m.  General: Cooperative, alert, well developed, in no acute distress. HEENT: Conjunctivae and lids unremarkable. Cardiovascular: Regular rhythm.  Lungs: Normal work of breathing. Neurologic: No focal deficits.   Lab Results  Component Value Date   CREATININE 0.94 06/11/2019   BUN 19 06/11/2019   NA 137 06/11/2019   K 4.0 06/11/2019   CL 106 06/11/2019   CO2 22 06/11/2019   Lab Results  Component Value Date   ALT 14 06/11/2019   AST 7 06/11/2019   ALKPHOS 69 06/11/2019   BILITOT 0.3 06/11/2019   Lab Results  Component Value Date   HGBA1C 6.0 06/11/2019   HGBA1C 5.8 (H) 03/12/2019   HGBA1C 5.8 (H) 09/28/2018   HGBA1C 5.7 (H) 02/14/2018   HGBA1C 6.1 11/24/2017   Lab Results  Component Value Date   INSULIN 38.0 (H) 07/19/2019   INSULIN 27.7 (H) 03/12/2019   INSULIN 2.0 (L) 09/28/2018   INSULIN 19.1 02/14/2018   Lab Results  Component Value Date   TSH 2.140 07/19/2019   Lab Results  Component Value Date   CHOL 177 06/11/2019   HDL 39.00 (L) 06/11/2019   LDLCALC 109 (H) 06/11/2019   LDLDIRECT 154.1 08/16/2012   TRIG 144.0 06/11/2019   CHOLHDL 5 06/11/2019   Lab Results  Component Value Date   WBC 10.1 06/11/2019   HGB 12.0 06/11/2019   HCT 36.1 06/11/2019   MCV 89.9 06/11/2019   PLT 323.0 06/11/2019   No results  found for: IRON, TIBC, FERRITIN  Attestation Statements:   Reviewed by clinician on day of visit: allergies, medications, problem list, medical history, surgical history, family history, social history, and previous encounter notes.   Wilhemena Durie, am acting as Location manager for [employer], FNP-C.  I have reviewed the above documentation for accuracy and completeness, and I agree with the above. -  Georgianne Fick, FNP

## 2019-09-04 ENCOUNTER — Ambulatory Visit (INDEPENDENT_AMBULATORY_CARE_PROVIDER_SITE_OTHER): Payer: BC Managed Care – PPO | Admitting: Psychology

## 2019-09-04 DIAGNOSIS — F411 Generalized anxiety disorder: Secondary | ICD-10-CM

## 2019-09-06 ENCOUNTER — Other Ambulatory Visit: Payer: Self-pay

## 2019-09-06 ENCOUNTER — Encounter: Payer: Self-pay | Admitting: Cardiovascular Disease

## 2019-09-06 ENCOUNTER — Ambulatory Visit: Payer: BC Managed Care – PPO | Admitting: Cardiovascular Disease

## 2019-09-06 VITALS — BP 126/62 | HR 84 | Ht 65.0 in | Wt 362.0 lb

## 2019-09-06 DIAGNOSIS — I472 Ventricular tachycardia: Secondary | ICD-10-CM | POA: Diagnosis not present

## 2019-09-06 DIAGNOSIS — I10 Essential (primary) hypertension: Secondary | ICD-10-CM

## 2019-09-06 DIAGNOSIS — G4730 Sleep apnea, unspecified: Secondary | ICD-10-CM | POA: Diagnosis not present

## 2019-09-06 DIAGNOSIS — I4729 Other ventricular tachycardia: Secondary | ICD-10-CM

## 2019-09-06 DIAGNOSIS — R002 Palpitations: Secondary | ICD-10-CM

## 2019-09-06 NOTE — Patient Instructions (Signed)
Medication Instructions:  Your physician recommends that you continue on your current medications as directed. Please refer to the Current Medication list given to you today.  *If you need a refill on your cardiac medications before your next appointment, please call your pharmacy*   Lab Work: None today If you have labs (blood work) drawn today and your tests are completely normal, you will receive your results only by: [name] MyChart Message (if you have MyChart) OR . A paper copy in the mail If you have any lab test that is abnormal or we need to change your treatment, we will call you to review the results.   Testing/Procedures: None today   Follow-Up: At [facility], you and your health needs are our priority.  As part of our continuing mission to provide you with exceptional heart care, we have created designated Provider Care Teams.  These Care Teams include your primary Cardiologist (physician) and Advanced Practice Providers (APPs -  Physician Assistants and Nurse Practitioners) who all work together to provide you with the care you need, when you need it.  We recommend signing up for the patient portal called "MyChart".  Sign up information is provided on this After Visit Summary.  MyChart is used to connect with patients for Virtual Visits (Telemedicine).  Patients are able to view lab/test results, encounter notes, upcoming appointments, etc.  Non-urgent messages can be sent to your provider as well.   To learn more about what you can do with MyChart, go to [email or web address].    Your next appointment:   12 month(s)  The format for your next appointment:   In Person  Provider:   Kate Sable, MD   Other Instructions None      Thank you for choosing [facility] !

## 2019-09-06 NOTE — Progress Notes (Addendum)
SUBJECTIVE: [name] is a 49 y.o. female with morbid obesity, severe sleep apnea, hypertension, paroxysmal SVT, chest pain, and anxiety with panic disorder, shortness of breath.  Cardiac CT was normal in March 2020.  Event monitoring demonstrated sinus rhythm with paroxysmal nonsustained ventricular tachycardia with a 6 beat and 10 beat run noted, 1 of which was symptomatic.  Most symptoms corresponded with sinus rhythm.  I initially recommended she try Lopressor 25 mg bid. She tried this but it made her dizzy.  I then reduced the dose to 12.5 mg twice daily.  She very seldom has palpitations.  She denies chest pain.  She is considering bariatric surgery.  She follows with [facility] for adrenal adenoma.  ECG performed in the office today which I ordered and personally interpreted demonstrates normal sinus rhythm with no ischemic ST segment or T-wave abnormalities, nor any arrhythmias.     Review of Systems: As per "subjective", otherwise negative.  No Known Allergies  Current Outpatient Medications  Medication Sig Dispense Refill  . ALPRAZolam (XANAX) 0.5 MG tablet Take 1 tablet (0.5 mg total) by mouth 3 times/day as needed-between meals & bedtime for anxiety. 40 tablet 0  . Cyanocobalamin (B-12 COMPLIANCE INJECTION) 1000 MCG/ML KIT Inject 1,000 mcg as directed every 30 (thirty) days.    [name] losartan (COZAAR) 25 MG tablet Take 1 tablet (25 mg total) by mouth daily. 30 tablet 0  . metFORMIN (GLUCOPHAGE) 500 MG tablet Take 1 tablet (500 mg total) by mouth 2 (two) times daily with a meal. 60 tablet 0  . metoprolol tartrate (LOPRESSOR) 25 MG tablet Take 0.5 tablets (12.5 mg total) by mouth 2 (two) times daily. 90 tablet 3  . pantoprazole (PROTONIX) 40 MG tablet TAKE 1 TABLET BY MOUTH EVERY DAY 90 tablet 3  . PARoxetine (PAXIL) 10 MG tablet Take 1 tablet (10 mg total) by mouth daily. (Patient taking differently: Take 7.5 mg by mouth daily. ) 30 tablet 0  .  topiramate (TOPAMAX) 50 MG tablet Take 1 tablet (50 mg total) by mouth daily. 30 tablet 0  . UNABLE TO FIND Med Name: CPAP    . valACYclovir (VALTREX) 1000 MG tablet TAKE 2 TABLETS EVERY 12 HOURS FOR 1 DAY START ASAP AFTER SYTPTOM ONSET 12 tablet 3  . Vitamin D, Ergocalciferol, (DRISDOL) 1.25 MG (50000 UNIT) CAPS capsule TAKE 1 CAPSULE BY MOUTH ONE TIME PER WEEK 4 capsule 0   No current facility-administered medications for this visit.    Past Medical History:  Diagnosis Date  . Anxiety   . ASTHMA   . B12 deficiency   . DELAYED GASTRIC EMPTYING   . DEPRESSION   . GERD   . Glucose intolerance (impaired glucose tolerance) 04/09/2015  . Hypertension   . OBESITY   . OBSTRUCTIVE SLEEP APNEA   . POLYCYSTIC OVARIAN DISEASE   . Prediabetes   . SMOKER   . SOB (shortness of breath)   . SVT (supraventricular tachycardia) ([place])   . Tachycardia   . Umbilical hernia   . Umbilical hernia   . Vitamin B12 deficiency 03/2015 dx   start IM replacement q 30d  . Vitamin D deficiency 03/2015 dx    Past Surgical History:  Procedure Laterality Date  . CESAREAN SECTION  97 & 98   x's 2  . Cocxyl removal  1995  . DILATION AND CURETTAGE OF UTERUS    . LAPAROSCOPY    . [place]  History   Socioeconomic History  . Marital status: Married    Spouse name: Vista Sawatzky  . Number of children: 2  . Years of education: Not on file  . Highest education level: Not on file  Occupational History  . 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 Smoking status: Light Tobacco Smoker    Packs/day: 0.25    Years: 30.00    Pack years: 7.50    Types: Cigarettes    Start date: 10/23/1987    Last attempt to quit: 10/23/2017    Years since quitting: 1.8  . Smokeless tobacco: Never Used  Substance and Sexual Activity  . Alcohol use: Yes    Alcohol/week: 0.0 standard drinks    Comment: Rarely  . Drug use: No  . Sexual activity: Not on file    Other Topics Concern  . Not on file  Social History Narrative   Lives with husband & 2 kids from 1st marriage. (1st husband expired 1998-leukemia) work as Merchandiser, retail, but currently unemployed   Social Determinants of Radio broadcast assistant Strain:   . Difficulty of Paying Living Expenses:   Food Insecurity:   . Worried About Charity fundraiser in the Last Year:   . Arboriculturist in the Last Year:   Transportation Needs:   . Film/video editor (Medical):   [name] Lack of Transportation (Non-Medical):   Physical Activity:   . Days of Exercise per Week:   . Minutes of Exercise per Session:   Stress:   . Feeling of Stress :   Social Connections:   . Frequency of Communication with Friends and Family:   . Frequency of Social Gatherings with Friends and Family:   . Attends Religious Services:   . Active Member of Clubs or Organizations:   . Attends Archivist Meetings:   [name] Marital Status:   Intimate Partner Violence:   . Fear of Current or Ex-Partner:   . Emotionally Abused:   [name] Physically Abused:   . Sexually Abused:     Barbarann Ehlers, RN was present throughout the entirety of the encounter.  Vitals:   09/06/19 1047  BP: 126/62  Pulse: 84  SpO2: 97%  Weight: (!) 362 lb (164.2 kg)  Height: 5' 5"  (1.651 m)    Wt Readings from Last 3 Encounters:  09/06/19 (!) 362 lb (164.2 kg)  08/28/19 (!) 358 lb (162.4 kg)  08/08/19 (!) 356 lb (161.5 kg)     PHYSICAL EXAM General: Morbidly obese female in NAD HEENT: Normal. Neck: No JVD, no thyromegaly. Lungs: Clear to auscultation bilaterally with normal respiratory effort. CV: Regular rate and rhythm, normal S1/S2, no S3/S4, no murmur. No pretibial or periankle edema.   Abdomen: Soft, nontender, obese.  Neurologic: Alert and oriented.  Psych: Normal affect. Skin: Normal. Musculoskeletal: No gross deformities.      Labs: Lab Results  Component Value Date/Time   K 4.0 06/11/2019  07:32 AM   BUN 19 06/11/2019 07:32 AM   BUN 14 03/12/2019 12:00 AM   CREATININE 0.94 06/11/2019 07:32 AM   ALT 14 06/11/2019 07:32 AM   TSH 2.140 07/19/2019 09:04 AM   HGB 12.0 06/11/2019 07:32 AM     Lipids: Lab Results  Component Value Date/Time   LDLCALC 109 (H) 06/11/2019 07:32 AM   LDLCALC 127 (H) 03/12/2019 12:00 AM   LDLDIRECT 154.1 08/16/2012 08:28 AM  CHOL 177 06/11/2019 07:32 AM   CHOL 189 03/12/2019 12:00 AM   TRIG 144.0 06/11/2019 07:32 AM   HDL 39.00 (L) 06/11/2019 07:32 AM   HDL 42 03/12/2019 12:00 AM        The following studies were reviewed today:  Coronary CT angiogram (06/18/18):  IMPRESSION: 1. Coronary calcium score of 0. This was 0 percentile for age and sex matched control.  2. Normal coronary origin with right dominance.  3. Study quality and ability to assess the distal vessels is limited by body habitus.  4. No evidence of CAD in the proximal to mid coronary arteries.    ASSESSMENT AND PLAN:  1.  Paroxysmal nonsustained ventricular tachycardia: Continue Lopressor 12.5 mg twice daily. She did not tolerate 25 mg bid as it led to dizziness.  2.  Chest pain and shortness of breath: Coronary CT was normal. No further episodes of chest pain. Exertional dyspnea likely due to morbid obesity/deconditioning.  3.  PSVT: No recurrences.  Continue Lopressor 12.5 mg twice daily.  4.  Severe sleep apnea: She is on CPAP.  5.  Hypertension: BP is normal.  No changes to therapy.   Disposition: Follow up 1 year   Kate Sable, M.D., F.A.C.C.

## 2019-09-12 ENCOUNTER — Ambulatory Visit (INDEPENDENT_AMBULATORY_CARE_PROVIDER_SITE_OTHER): Payer: BC Managed Care – PPO | Admitting: Psychology

## 2019-09-12 DIAGNOSIS — F411 Generalized anxiety disorder: Secondary | ICD-10-CM | POA: Diagnosis not present

## 2019-09-12 NOTE — Addendum Note (Signed)
Addended by: Barbarann Ehlers A on: 09/12/2019 10:30 AM   Modules accepted: Orders

## 2019-09-14 IMAGING — CT CT HEAR MORPH WITH CTA COR WITH SCORE WITH CA WITH CONTRAST AND
1 of 13 series · 2 of 20 positions shown, 3 images · IV contrast (APPLIED)
Comparison: None

Addendum:
CLINICAL DATA: 47F with hypertension, obesity, SVT and chest pain.

EXAM:
Cardiac/Coronary  CT
TECHNIQUE: The patient was scanned on a Phillips Force scanner.

[Series 10: best syst · axial · 0.37mm/px · z∈[+1101,+1245]mm · 2 of 361 slices shown, 3 images]
[im 1/361  vessel]
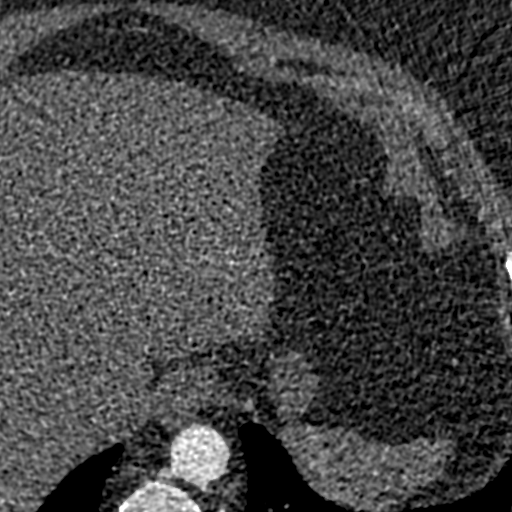
[im 1/361  lung]
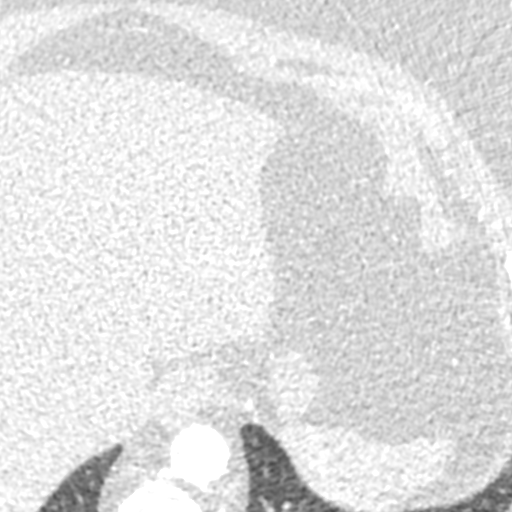
[im 361/361  vessel]
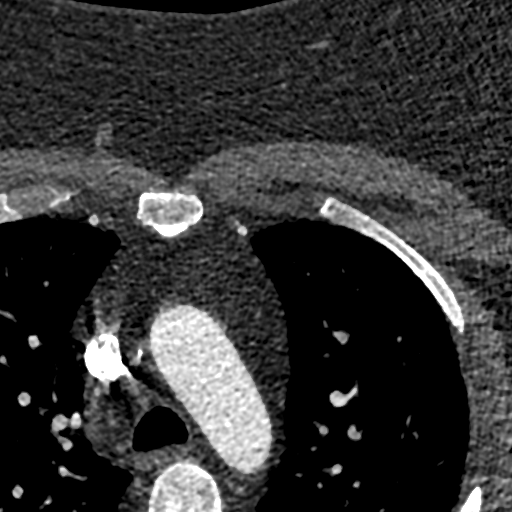

[2 of 20 positions shown; findings below may reference images not displayed]



Aorta: Normal size. Ascending aorta 3.0 cm. No calcifications. No
dissection.

Aortic Valve:  Trileaflet.  No calcifications.

Coronary Arteries:  Normal coronary origin.  Right dominance.

RCA is a large dominant artery that gives rise to PDA and PLVB.
There is no plaque.

Left main is a large artery that gives rise to LAD and LCX arteries.

LAD is a large vessel that has no plaque.

LCX is a non-dominant artery that gives rise to one large OM1
branch. There is no plaque.

Other findings:

Normal pulmonary vein drainage into the left atrium.

Normal let atrial appendage without a thrombus.

Normal size of the pulmonary artery.
IMPRESSION: 1. Coronary calcium score of 0. This was 0 percentile for age and
sex matched control.

2. Normal coronary origin with right dominance.

3. Study quality and ability to assess the distal vessels is limited
by body habitus.

4. No evidence of CAD in the proximal to mid coronary arteries.

5. Overview assessment of the non-cardiovascular thoracic cavity by
Radiology is pending.

EXAM:
OVER-READ INTERPRETATION  CT CHEST

The following report is an over-read performed by radiologist Dr.
does not include interpretation of cardiac or coronary anatomy or
pathology. The coronary CTA interpretation by the cardiologist is
attached.
FINDINGS: The scratch set no mass or adenopathy identified within the
mediastinum. The visualized portions of the esophagus and lower
airway are unremarkable.

3 mm subpleural nodule within the lateral left upper lobe
identified, image 16/25. Scarring identified within the lingula and
right middle lobe.

Limited imaging through the upper abdomen is unremarkable. No acute
abnormality.

Visualized osseous structures are unremarkable.
IMPRESSION: 1. 3 mm subpleural nodule is identified within the left upper lobe.
No follow-up needed if patient is low-risk. Non-contrast chest CT
can be considered in 12 months if patient is high-risk. This
recommendation follows the consensus statement: Guidelines for
Management of Incidental Pulmonary Nodules Detected on CT Images:

*** End of Addendum ***
FINDINGS: A 120 kV prospective scan was triggered in the descending thoracic
aorta at 111 HU's. Axial non-contrast 3 mm slices were carried out
through the heart. The data set was analyzed on a dedicated work
station and scored using the Agatson method. Gantry rotation speed
was 250 msecs and collimation was .6 mm. No beta blockade and 0.8 mg
of sl NTG was given. The 3D data set was reconstructed in 5%
intervals of the 67-82 % of the R-R cycle. Diastolic phases were
analyzed on a dedicated work station using MPR, MIP and VRT modes.
The patient received 80 cc of contrast.

Aorta: Normal size. Ascending aorta 3.0 cm. No calcifications. No
dissection.

Aortic Valve:  Trileaflet.  No calcifications.

Coronary Arteries:  Normal coronary origin.  Right dominance.

RCA is a large dominant artery that gives rise to PDA and PLVB.
There is no plaque.

Left main is a large artery that gives rise to LAD and LCX arteries.

LAD is a large vessel that has no plaque.

LCX is a non-dominant artery that gives rise to one large OM1
branch. There is no plaque.

Other findings:

Normal pulmonary vein drainage into the left atrium.

Normal let atrial appendage without a thrombus.

Normal size of the pulmonary artery.
IMPRESSION: 1. Coronary calcium score of 0. This was 0 percentile for age and
sex matched control.

2. Normal coronary origin with right dominance.

3. Study quality and ability to assess the distal vessels is limited
by body habitus.

4. No evidence of CAD in the proximal to mid coronary arteries.

5. Overview assessment of the non-cardiovascular thoracic cavity by
Radiology is pending.

## 2019-09-19 ENCOUNTER — Ambulatory Visit (INDEPENDENT_AMBULATORY_CARE_PROVIDER_SITE_OTHER): Payer: BC Managed Care – PPO | Admitting: Psychology

## 2019-09-19 DIAGNOSIS — F411 Generalized anxiety disorder: Secondary | ICD-10-CM

## 2019-09-20 ENCOUNTER — Other Ambulatory Visit: Payer: Self-pay

## 2019-09-20 ENCOUNTER — Ambulatory Visit (INDEPENDENT_AMBULATORY_CARE_PROVIDER_SITE_OTHER): Payer: BC Managed Care – PPO | Admitting: *Deleted

## 2019-09-20 DIAGNOSIS — E538 Deficiency of other specified B group vitamins: Secondary | ICD-10-CM

## 2019-09-20 MED ORDER — CYANOCOBALAMIN 1000 MCG/ML IJ SOLN
1000.0000 ug | Freq: Once | INTRAMUSCULAR | Status: AC
  Administered 2019-09-20: 1000 ug via INTRAMUSCULAR

## 2019-09-20 NOTE — Progress Notes (Signed)
Pls cosign for B12 inj.Marland KitchenJohny Chess

## 2019-09-25 ENCOUNTER — Other Ambulatory Visit: Payer: Self-pay

## 2019-09-25 ENCOUNTER — Encounter (INDEPENDENT_AMBULATORY_CARE_PROVIDER_SITE_OTHER): Payer: Self-pay | Admitting: Family Medicine

## 2019-09-25 ENCOUNTER — Ambulatory Visit (INDEPENDENT_AMBULATORY_CARE_PROVIDER_SITE_OTHER): Payer: BC Managed Care – PPO | Admitting: Family Medicine

## 2019-09-25 VITALS — BP 129/63 | HR 62 | Temp 98.0°F | Ht 65.0 in | Wt 357.0 lb

## 2019-09-25 DIAGNOSIS — Z9189 Other specified personal risk factors, not elsewhere classified: Secondary | ICD-10-CM

## 2019-09-25 DIAGNOSIS — F411 Generalized anxiety disorder: Secondary | ICD-10-CM

## 2019-09-25 DIAGNOSIS — Z6841 Body Mass Index (BMI) 40.0 and over, adult: Secondary | ICD-10-CM

## 2019-09-25 DIAGNOSIS — E559 Vitamin D deficiency, unspecified: Secondary | ICD-10-CM

## 2019-09-25 MED ORDER — VITAMIN D (ERGOCALCIFEROL) 1.25 MG (50000 UNIT) PO CAPS: ORAL_CAPSULE | ORAL | 0 refills | Status: DC

## 2019-09-25 MED ORDER — TOPIRAMATE 50 MG PO TABS: 50.0000 mg | ORAL_TABLET | Freq: Every day | ORAL | 0 refills | Status: DC

## 2019-09-25 NOTE — Progress Notes (Signed)
Chief Complaint:   OBESITY [name] is here to discuss her progress with her obesity treatment plan along with follow-up of her obesity related diagnoses. [name] is on following a lower carbohydrate, vegetable and lean protein rich diet plan and states she is following her eating plan approximately 90% of the time. [name] states she is doing 0 minutes 0 times per week.  Today's visit was #: 61 Starting weight: 368 lbs Starting date: 02/14/2018 Today's weight: 357 lbs Today's date: 09/25/2019 Total lbs lost to date: 11 Total lbs lost since last in-office visit: 1  Interim History: [name] feels she needs to switch to a more structured plan. She is skipping lunch occasionally due to long work hours. She lacks times to meal prep and take care of herself. She has been eating out quite a bit.  Subjective:   1. GAD (generalized anxiety disorder) We are weaning [name] off Paxil very slowly and [name] is on 7.5 mg daily. She has attempted to wean quickly before and had a panic attack. She is feeling fine at this dose. She is seeing a counselor regularly. She is on Topamax 50 mg for cravings which is working well.  2. Vitamin D deficiency Rena's vit D level is nearly at goal. She is on weekly prescription Vit D.  3. At risk for deficient intake of food The patient is at a higher than average risk of deficient intake of food due to skipping meals.  Assessment/Plan:   1. GAD (generalized anxiety disorder) Behavior modification techniques were discussed today to help [name] deal with her anxiety. [name] agreed to decrease dose of Paxil to 5 mg daily. We will refill Topamax for 1 month. Orders and follow up as documented in patient record.   - topiramate (TOPAMAX) 50 MG tablet; Take 1 tablet (50 mg total) by mouth daily.  Dispense: 30 tablet; Refill: 0  2. Vitamin D deficiency Low Vitamin D level contributes to fatigue and are associated with obesity, breast, and colon cancer. We will refill prescription  Vitamin D for 1 month. [name] will follow-up for routine testing of Vitamin D, at least 2-3 times per year to avoid over-replacement.  - Vitamin D, Ergocalciferol, (DRISDOL) 1.25 MG (50000 UNIT) CAPS capsule; TAKE 1 CAPSULE BY MOUTH ONE TIME PER WEEK  Dispense: 4 capsule; Refill: 0  3. At risk for deficient intake of food [name] was given approximately 15 minutes of deficit intake of food prevention counseling today. [name] is at risk for eating too few calories based on current food recall. She was encouraged to focus on meeting caloric and protein goals according to her recommended meal plan.   4. Class 3 severe obesity with serious comorbidity and body mass index (BMI) of 50.0 to 59.9 in adult, unspecified obesity type ([place]) Kina is currently in the action stage of change. As such, her goal is to continue with weight loss efforts. She has agreed to the Category 3 Plan.   Handouts given today: Category 3 plus Lunch and Breakfast options, and Smart Fruit Options.  Exercise goals: All adults should avoid inactivity. Some physical activity is better than none, and adults who participate in any amount of physical activity gain some health benefits.  Behavioral modification strategies: increasing lean protein intake, decreasing simple carbohydrates, no skipping meals and travel eating strategies.  [name] has agreed to follow-up with our clinic in 3 weeks. She was informed of the importance of frequent follow-up visits to maximize her success with intensive lifestyle modifications for  her multiple health conditions.   Objective:   Blood pressure 129/63, pulse 62, temperature 98 F (36.7 C), temperature source Oral, height 5' 5"  (1.651 m), weight (!) 357 lb (161.9 kg), SpO2 97 %. Body mass index is 59.41 kg/m.  General: Cooperative, alert, well developed, in no acute distress. HEENT: Conjunctivae and lids unremarkable. Cardiovascular: Regular rhythm.  Lungs: Normal work of breathing. Neurologic: No  focal deficits.   Lab Results  Component Value Date   CREATININE 0.94 06/11/2019   BUN 19 06/11/2019   NA 137 06/11/2019   K 4.0 06/11/2019   CL 106 06/11/2019   CO2 22 06/11/2019   Lab Results  Component Value Date   ALT 14 06/11/2019   AST 7 06/11/2019   ALKPHOS 69 06/11/2019   BILITOT 0.3 06/11/2019   Lab Results  Component Value Date   HGBA1C 6.0 06/11/2019   HGBA1C 5.8 (H) 03/12/2019   HGBA1C 5.8 (H) 09/28/2018   HGBA1C 5.7 (H) 02/14/2018   HGBA1C 6.1 11/24/2017   Lab Results  Component Value Date   INSULIN 38.0 (H) 07/19/2019   INSULIN 27.7 (H) 03/12/2019   INSULIN 2.0 (L) 09/28/2018   INSULIN 19.1 02/14/2018   Lab Results  Component Value Date   TSH 2.140 07/19/2019   Lab Results  Component Value Date   CHOL 177 06/11/2019   HDL 39.00 (L) 06/11/2019   LDLCALC 109 (H) 06/11/2019   LDLDIRECT 154.1 08/16/2012   TRIG 144.0 06/11/2019   CHOLHDL 5 06/11/2019   Lab Results  Component Value Date   WBC 10.1 06/11/2019   HGB 12.0 06/11/2019   HCT 36.1 06/11/2019   MCV 89.9 06/11/2019   PLT 323.0 06/11/2019   No results found for: IRON, TIBC, FERRITIN  Attestation Statements:   Reviewed by clinician on day of visit: allergies, medications, problem list, medical history, surgical history, family history, social history, and previous encounter notes.    Wilhemena Durie, am acting as Location manager for [employer], FNP-C.  I have reviewed the above documentation for accuracy and completeness, and I agree with the above. - Georgianne Fick, FNP

## 2019-09-30 ENCOUNTER — Encounter: Payer: Self-pay | Admitting: Internal Medicine

## 2019-09-30 ENCOUNTER — Telehealth: Payer: BC Managed Care – PPO

## 2019-10-01 ENCOUNTER — Ambulatory Visit: Payer: BC Managed Care – PPO | Admitting: Psychology

## 2019-10-08 ENCOUNTER — Telehealth: Payer: Self-pay

## 2019-10-08 NOTE — Telephone Encounter (Signed)
Per [facility], Caller states she is weaning off paroxetine and is having a panic attack. Has been weaning since February, when she went to put on sleep mask she started having a panic attack. She has alprazolam but is afraid to take it.   Advised to see PCP within 3 days.

## 2019-10-10 NOTE — Progress Notes (Signed)
Subjective:    Patient ID: [name], female    DOB: 1970/07/02, 49 y.o.   MRN: [id number]  HPI The patient is here for follow up of their anxiety  She had her first panic attack recently since two years ago.  She is continuing to try to taper off the Paxil.  She is on 5 mg of the paxil now - this is her second week.  The first couple of weeks decreasing her dose is always rough, but she has been ok.  She has been able to cope. In decreasing the dose she has had speech issues, memory difficulties, N/V and outbursts of crying.    Recently she was at the beach with her family.  The panic attacks started there in the middle of the night.   She woke up and was hot and felt like she could not breathe.  She took off her cpap mask off and walked around and breathed deep and it went away.  That happeneded twice while at the beach.  She had another small episode when she got home.  The last panic attack occurred when she was sleeping, and her husband woke her up to put on her mask - she feel asleep w/o her cpap.  She was hot and put her mask on.  She started to feel claustrophobic.  She then had a panic attack.  Her heart raced, her mind raced and she could not think. She felt like she had no control. She started walking around and was scared she would have to go to the hospital - she started crying.   She was scared to take the xanax.  She called the on-call service and spoke to the nurse and she ended up taking the xanax.  She did breathing and was able to get back to sleep.   Yesterday she saw endo - she went for imaging and felt like she could not breath and started to panic while getting the scan.    She wants to continue to taper off the Paxil, but is concerned about the anxiety attacks.  She knows she should take the alprazolam, but is concerned about taking it.  She is also very concerned about getting the Covid vaccine, but does want to get it.   Medications and allergies reviewed with  patient and updated if appropriate.  Patient Active Problem List   Diagnosis Date Noted  . GAD (generalized anxiety disorder) 06/06/2019  . Other hyperlipidemia 03/13/2019  . Skin abnormalities 11/25/2017  . 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 Skin tag 11/25/2017  . Essential hypertension 11/01/2017  . Chronic frontal sinusitis 10/30/2017  . Submandibular gland mass 10/30/2017  . Prediabetes 05/18/2017  . SOB (shortness of breath) 09/24/2016  . Palpitations 09/24/2016  . Family history of early CAD 03/23/2016  . SVT (supraventricular tachycardia) ([place]) 03/14/2016  . Adrenal nodule ([place]) 01/06/2016  . Supraumbilical hernia without gangrene and without obstruction 12/02/2015  . Morbid obesity ([place]) 04/09/2015  . Vitamin D deficiency   . Vitamin B12 deficiency   . Idiopathic intracranial hypertension 05/24/2013  . Anxiety   . GERD 01/28/2009  . POLYCYSTIC OVARIAN DISEASE 02/10/2007  . Class 3 severe obesity with serious comorbidity and body mass index (BMI) of 50.0 to 59.9 in adult ([place]) 02/10/2007  . Obstructive sleep apnea 02/10/2007  . Cough variant asthma 02/10/2007    Current Outpatient Medications on File Prior to Visit  Medication Sig Dispense Refill  . ALPRAZolam (XANAX) 0.5 MG  tablet Take 1 tablet (0.5 mg total) by mouth 3 times/day as needed-between meals & bedtime for anxiety. 40 tablet 0  . Cyanocobalamin (B-12 COMPLIANCE INJECTION) 1000 MCG/ML KIT Inject 1,000 mcg as directed every 30 (thirty) days.    [name] losartan (COZAAR) 25 MG tablet Take 1 tablet (25 mg total) by mouth daily. 30 tablet 0  . metFORMIN (GLUCOPHAGE) 500 MG tablet Take 1 tablet (500 mg total) by mouth 2 (two) times daily with a meal. 60 tablet 0  . metoprolol tartrate (LOPRESSOR) 25 MG tablet Take 0.5 tablets (12.5 mg total) by mouth 2 (two) times daily. 90 tablet 3  . pantoprazole (PROTONIX) 40 MG tablet TAKE 1 TABLET BY MOUTH EVERY DAY 90 tablet 3  . PARoxetine (PAXIL) 10 MG tablet Take 1 tablet  (10 mg total) by mouth daily. (Patient taking differently: Take 7.5 mg by mouth daily. Patient now takes 5 mg) 30 tablet 0  . topiramate (TOPAMAX) 50 MG tablet Take 1 tablet (50 mg total) by mouth daily. 30 tablet 0  . UNABLE TO FIND Med Name: CPAP    . valACYclovir (VALTREX) 1000 MG tablet TAKE 2 TABLETS EVERY 12 HOURS FOR 1 DAY START ASAP AFTER SYTPTOM ONSET 12 tablet 3  . Vitamin D, Ergocalciferol, (DRISDOL) 1.25 MG (50000 UNIT) CAPS capsule TAKE 1 CAPSULE BY MOUTH ONE TIME PER WEEK 4 capsule 0   No current facility-administered medications on file prior to visit.    Past Medical History:  Diagnosis Date  . Anxiety   . ASTHMA   . B12 deficiency   . DELAYED GASTRIC EMPTYING   . DEPRESSION   . GERD   . Glucose intolerance (impaired glucose tolerance) 04/09/2015  . Hypertension   . OBESITY   . OBSTRUCTIVE SLEEP APNEA   . POLYCYSTIC OVARIAN DISEASE   . Prediabetes   . SMOKER   . SOB (shortness of breath)   . SVT (supraventricular tachycardia) ([place])   . Tachycardia   . Umbilical hernia   . Umbilical hernia   . Vitamin B12 deficiency 03/2015 dx   start IM replacement q 30d  . Vitamin D deficiency 03/2015 dx    Past Surgical History:  Procedure Laterality Date  . CESAREAN SECTION  97 & 98   x's 2  . Cocxyl removal  1995  . DILATION AND CURETTAGE OF UTERUS    . LAPAROSCOPY    . TUBAL LIGATION  1998    Social History   Socioeconomic History  . Marital status: Married    Spouse name: Renetta Suman  . Number of children: 2  . Years of education: Not on file  . Highest education level: Not on file  Occupational History  . 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 Smoking status: Light Tobacco Smoker    Packs/day: 0.25    Years: 30.00    Pack years: 7.50    Types: Cigarettes    Start date: 10/23/1987    Last attempt to quit: 10/23/2017    Years since quitting: 1.9  . Smokeless tobacco: Never Used  Vaping Use  . Vaping Use:  Never used  Substance and Sexual Activity  . Alcohol use: Yes    Alcohol/week: 0.0 standard drinks    Comment: Rarely  . Drug use: No  . Sexual activity: Not on file  Other Topics Concern  . Not on file  Social History Narrative   Lives with husband &  2 kids from 1st marriage. (1st husband expired 1998-leukemia) work as Merchandiser, retail, but currently unemployed   Social Determinants of Radio broadcast assistant Strain:   . Difficulty of Paying Living Expenses:   Food Insecurity:   . Worried About Charity fundraiser in the Last Year:   . Arboriculturist in the Last Year:   Transportation Needs:   . Film/video editor (Medical):   [name] Lack of Transportation (Non-Medical):   Physical Activity:   . Days of Exercise per Week:   . Minutes of Exercise per Session:   Stress:   . Feeling of Stress :   Social Connections:   . Frequency of Communication with Friends and Family:   . Frequency of Social Gatherings with Friends and Family:   . Attends Religious Services:   . Active Member of Clubs or Organizations:   . Attends Archivist Meetings:   [name] Marital Status:     Family History  Problem Relation Age of Onset  . Breast cancer Mother        dx 11/2009  . Diabetes Mother   . High blood pressure Mother   . Thyroid disease Mother   . Anxiety disorder Mother   . Obesity Mother   . Diabetes Maternal Aunt   . Prostate cancer Maternal Grandfather 65       also hx colon polyps  . Heart disease Paternal Grandmother   . Pancreatic cancer Paternal Grandmother 71  . High blood pressure Father   . High Cholesterol Father   . Depression Father   . Heart murmur Sister   . Adrenal disorder Neg Hx     Review of Systems     Objective:   Vitals:   10/11/19 1327  BP: 122/84  Pulse: 83  Temp: 98 F (36.7 C)  SpO2: 97%   BP Readings from Last 3 Encounters:  10/11/19 122/84  09/25/19 129/63  09/06/19 126/62   Wt Readings from Last 3 Encounters:    10/11/19 (!) 367 lb 3.2 oz (166.6 kg)  09/25/19 (!) 357 lb (161.9 kg)  09/06/19 (!) 362 lb (164.2 kg)   Body mass index is 61.11 kg/m.   Physical Exam Constitutional:      General: She is not in acute distress.    Appearance: Normal appearance. She is not ill-appearing.  HENT:     Head: Normocephalic and atraumatic.  Skin:    General: Skin is warm and dry.  Neurological:     Mental Status: She is alert.  Psychiatric:        Behavior: Behavior normal.        Thought Content: Thought content normal.        Judgment: Judgment normal.     Comments: Very anxious            Assessment & Plan:    See Problem List for Assessment and Plan of chronic medical problems.    This visit occurred during the SARS-CoV-2 public health emergency.  Safety protocols were in place, including screening questions prior to the visit, additional usage of staff PPE, and extensive cleaning of exam room while observing appropriate contact time as indicated for disinfecting solutions.

## 2019-10-11 ENCOUNTER — Other Ambulatory Visit: Payer: Self-pay

## 2019-10-11 ENCOUNTER — Ambulatory Visit (INDEPENDENT_AMBULATORY_CARE_PROVIDER_SITE_OTHER): Payer: BC Managed Care – PPO | Admitting: Internal Medicine

## 2019-10-11 ENCOUNTER — Encounter: Payer: Self-pay | Admitting: Internal Medicine

## 2019-10-11 VITALS — BP 122/84 | HR 83 | Temp 98.0°F | Ht 65.0 in | Wt 367.2 lb

## 2019-10-11 DIAGNOSIS — F419 Anxiety disorder, unspecified: Secondary | ICD-10-CM

## 2019-10-11 NOTE — Assessment & Plan Note (Addendum)
Chronic generalized anxiety with panic attacks Has been free of panic attacks until recently She knows some of her triggers-being hot, wearing her CPAP machine, claustrophobia She is seeing a therapist and doing breathing exercises.  She has been able to deal with a lot of her anxiety as she is tapered off the Paxil-currently taking 5 mg She will continue to slowly taper off the Paxil Stressed that she needs to take the alprazolam when needed and not wait-advised that she should be taking this more often, but not daily Continue seeing the therapist Continue regular episodes of deep breathing throughout the day.  Stressed the importance of regular exercise Discussed avoiding triggers Advised to call the on-call nurse if needed on weekends and nights  At this point continue Paxil 5 mg daily Continue alprazolam daily as needed-she has plenty and does not need a refill at this time

## 2019-10-11 NOTE — Patient Instructions (Signed)
TAKE THE XANAX IF NEEDED

## 2019-10-16 ENCOUNTER — Ambulatory Visit (INDEPENDENT_AMBULATORY_CARE_PROVIDER_SITE_OTHER): Payer: BC Managed Care – PPO | Admitting: Psychology

## 2019-10-16 DIAGNOSIS — F411 Generalized anxiety disorder: Secondary | ICD-10-CM

## 2019-10-17 ENCOUNTER — Encounter (INDEPENDENT_AMBULATORY_CARE_PROVIDER_SITE_OTHER): Payer: Self-pay | Admitting: Family Medicine

## 2019-10-17 ENCOUNTER — Other Ambulatory Visit: Payer: Self-pay

## 2019-10-17 ENCOUNTER — Ambulatory Visit (INDEPENDENT_AMBULATORY_CARE_PROVIDER_SITE_OTHER): Payer: BC Managed Care – PPO | Admitting: Family Medicine

## 2019-10-17 VITALS — BP 107/71 | HR 70 | Temp 97.5°F | Ht 65.0 in | Wt 363.0 lb

## 2019-10-17 DIAGNOSIS — R7303 Prediabetes: Secondary | ICD-10-CM | POA: Diagnosis not present

## 2019-10-17 DIAGNOSIS — Z9189 Other specified personal risk factors, not elsewhere classified: Secondary | ICD-10-CM | POA: Diagnosis not present

## 2019-10-17 DIAGNOSIS — Z6841 Body Mass Index (BMI) 40.0 and over, adult: Secondary | ICD-10-CM

## 2019-10-17 DIAGNOSIS — F411 Generalized anxiety disorder: Secondary | ICD-10-CM | POA: Diagnosis not present

## 2019-10-17 DIAGNOSIS — E559 Vitamin D deficiency, unspecified: Secondary | ICD-10-CM | POA: Diagnosis not present

## 2019-10-17 DIAGNOSIS — I10 Essential (primary) hypertension: Secondary | ICD-10-CM | POA: Diagnosis not present

## 2019-10-17 MED ORDER — TOPIRAMATE 50 MG PO TABS: 50.0000 mg | ORAL_TABLET | Freq: Every day | ORAL | 0 refills | Status: DC

## 2019-10-17 MED ORDER — VITAMIN D (ERGOCALCIFEROL) 1.25 MG (50000 UNIT) PO CAPS: ORAL_CAPSULE | ORAL | 0 refills | Status: DC

## 2019-10-17 MED ORDER — LOSARTAN POTASSIUM 25 MG PO TABS: 25.0000 mg | ORAL_TABLET | Freq: Every day | ORAL | 0 refills | Status: DC

## 2019-10-17 MED ORDER — METFORMIN HCL 500 MG PO TABS: 500.0000 mg | ORAL_TABLET | Freq: Two times a day (BID) | ORAL | 0 refills | Status: DC

## 2019-10-18 ENCOUNTER — Other Ambulatory Visit: Payer: Self-pay

## 2019-10-18 ENCOUNTER — Ambulatory Visit (INDEPENDENT_AMBULATORY_CARE_PROVIDER_SITE_OTHER): Payer: BC Managed Care – PPO

## 2019-10-18 DIAGNOSIS — E538 Deficiency of other specified B group vitamins: Secondary | ICD-10-CM

## 2019-10-18 MED ORDER — CYANOCOBALAMIN 1000 MCG/ML IJ SOLN
1000.0000 ug | INTRAMUSCULAR | Status: DC
  Administered 2019-10-18: 1000 ug via INTRAMUSCULAR

## 2019-10-18 NOTE — Progress Notes (Signed)
Pt here for monthly B12 injection per Dr Quay Burow.  B12 1065mg given IM right deltoind and pt tolerated injection well.  Pt to schedule next monthly injection upon check out.

## 2019-10-19 ENCOUNTER — Ambulatory Visit: Payer: BC Managed Care – PPO | Attending: Internal Medicine

## 2019-10-19 DIAGNOSIS — Z23 Encounter for immunization: Secondary | ICD-10-CM

## 2019-10-19 NOTE — Progress Notes (Signed)
   Covid-19 Vaccination Clinic  Name:  [name]    MRN: [id number] DOB: May 23, 1970  10/19/2019  Ms. [name] was observed post Covid-19 immunization for 30 minutes based on pre-vaccination screening without incident. She was provided with Vaccine Information Sheet and instruction to access the V-Safe system.   Ms. [name] was instructed to call 911 with any severe reactions post vaccine: [name] Difficulty breathing  . Swelling of face and throat  . A fast heartbeat  . A bad rash all over body  . Dizziness and weakness   Immunizations Administered    Name Date Dose VIS Date Route   Pfizer COVID-19 Vaccine 10/19/2019  9:40 AM 0.3 mL 06/05/2018 Intramuscular   Manufacturer: [employer], [employer]   Lot: [id number]   [place]: [phone]

## 2019-10-22 ENCOUNTER — Encounter (INDEPENDENT_AMBULATORY_CARE_PROVIDER_SITE_OTHER): Payer: Self-pay | Admitting: Family Medicine

## 2019-10-22 NOTE — Progress Notes (Signed)
Chief Complaint:   OBESITY [name] is here to discuss her progress with her obesity treatment plan along with follow-up of her obesity related diagnoses. [name] is on the Category 3 Plan and states she is following her eating plan approximately 85% of the time. [name] states she is doing 0 minutes 0 times per week.  Today's visit was #: 30 Starting weight: 368 lbs Starting date: 02/14/2018 Today's weight: 363 lbs Today's date: 10/17/2019 Total lbs lost to date: 5 Total lbs lost since last in-office visit: 0  Interim History: [name] has had extreme anxiety recently, and some panic attacks related to work stress and stress over upcoming COVID vaccine. She notes not eating enough due to stress. She is skipping meals. She denies drinking sugar sweetened beverages.  Subjective:   1. GAD (generalized anxiety disorder) [name] has recent extreme anxiety and panic attacks. We are weaning her off of Paxil. She is currently taking 5 mg daily. She is seeing a counselor regularly. She is on Topamax for emotional eating which she believes is helpful. She notes stress is causing her to not eat enough. She is on Xanax as needed but she is reluctant to take it.  2. Vitamin D deficiency [name]'s last Vit D level was nearly at goal. She is on weekly prescription Vit D.  3. Essential hypertension [name]'s blood pressure is well controlled. Cardiovascular ROS: no chest pain or dyspnea on exertion.  BP Readings from Last 3 Encounters:  10/17/19 107/71  10/11/19 122/84  09/25/19 129/63   Lab Results  Component Value Date   CREATININE 0.94 06/11/2019   CREATININE 0.92 03/12/2019   CREATININE 0.97 09/12/2018   4. Pre-diabetes [name] has a diagnosis of pre-diabetes based on her elevated Hgb A1c and was informed this puts her at greater risk of developing diabetes. Last A1c was 6.0 on metformin. She continues to work on diet and exercise to decrease her risk of diabetes. She denies nausea or hypoglycemia.  Lab  Results  Component Value Date   HGBA1C 6.0 06/11/2019   Lab Results  Component Value Date   INSULIN 38.0 (H) 07/19/2019   INSULIN 27.7 (H) 03/12/2019   INSULIN 2.0 (L) 09/28/2018   INSULIN 19.1 02/14/2018   5. At risk for deficient intake of food The patient is at a higher than average risk of deficient intake of food due to skipping meals.  Assessment/Plan:   1. GAD (generalized anxiety disorder) Behavior modification techniques were discussed today to help [name] deal with her anxiety. [name] is to take Xanax if needed, and continue Paxil at 5 mg, and we will refill Topamax for 1 month. I encouraged her to take a Xanax before going to have her COVID vaccine.   - topiramate (TOPAMAX) 50 MG tablet; Take 1 tablet (50 mg total) by mouth daily.  Dispense: 30 tablet; Refill: 0  2. Vitamin D deficiency Low Vitamin D level contributes to fatigue and are associated with obesity, breast, and colon cancer. We will refill prescription Vitamin D for 1 month. [name] will follow-up for routine testing of Vitamin D, at least 2-3 times per year to avoid over-replacement.  - Vitamin D, Ergocalciferol, (DRISDOL) 1.25 MG (50000 UNIT) CAPS capsule; TAKE 1 CAPSULE BY MOUTH ONE TIME PER WEEK  Dispense: 4 capsule; Refill: 0  3. Essential hypertension [name] is working on healthy weight loss and exercise to improve blood pressure control. We will watch for signs of hypotension as she continues her lifestyle modifications. We will refill losartan  for 1 month.  - losartan (COZAAR) 25 MG tablet; Take 1 tablet (25 mg total) by mouth daily.  Dispense: 30 tablet; Refill: 0  4. Pre-diabetes [name] will continue to work on weight loss, exercise, and decreasing simple carbohydrates to help decrease the risk of diabetes. We will refill metformin for 1 month.  - metFORMIN (GLUCOPHAGE) 500 MG tablet; Take 1 tablet (500 mg total) by mouth 2 (two) times daily with a meal.  Dispense: 60 tablet; Refill: 0  5. At risk for  deficient intake of food [name] was given approximately 15 minutes of deficit intake of food prevention counseling today. [name] is at risk for eating too few calories based on current food recall. She was encouraged to focus on meeting caloric and protein goals according to her recommended meal plan.   6. Class 3 severe obesity with serious comorbidity and body mass index (BMI) of 60.0 to 69.9 in adult, unspecified obesity type ([name]) [name] is currently in the action stage of change. As such, her goal is to continue with weight loss efforts. She has agreed to the Category 3 Plan.   Handouts given today: Protein Equivalents, and Snack Options. [name] may have a protein shake for breakfast.  Exercise goals: No exercise has been prescribed at this time.  Behavioral modification strategies: increasing lean protein intake, no skipping meals and meal planning and cooking strategies.  [name] has agreed to follow-up with our clinic in 3 weeks. She was informed of the importance of frequent follow-up visits to maximize her success with intensive lifestyle modifications for her multiple health conditions.   Objective:   Blood pressure 107/71, pulse 70, temperature (!) 97.5 F (36.4 C), temperature source Oral, height 5' 5"  (1.651 m), weight (!) 363 lb (164.7 kg), last menstrual period 10/17/2019, SpO2 99 %. Body mass index is 60.41 kg/m.  General: Cooperative, alert, well developed, in no acute distress. HEENT: Conjunctivae and lids unremarkable. Cardiovascular: Regular rhythm.  Lungs: Normal work of breathing. Neurologic: No focal deficits.   Lab Results  Component Value Date   CREATININE 0.94 06/11/2019   BUN 19 06/11/2019   NA 137 06/11/2019   K 4.0 06/11/2019   CL 106 06/11/2019   CO2 22 06/11/2019   Lab Results  Component Value Date   ALT 14 06/11/2019   AST 7 06/11/2019   ALKPHOS 69 06/11/2019   BILITOT 0.3 06/11/2019   Lab Results  Component Value Date   HGBA1C 6.0 06/11/2019    HGBA1C 5.8 (H) 03/12/2019   HGBA1C 5.8 (H) 09/28/2018   HGBA1C 5.7 (H) 02/14/2018   HGBA1C 6.1 11/24/2017   Lab Results  Component Value Date   INSULIN 38.0 (H) 07/19/2019   INSULIN 27.7 (H) 03/12/2019   INSULIN 2.0 (L) 09/28/2018   INSULIN 19.1 02/14/2018   Lab Results  Component Value Date   TSH 2.140 07/19/2019   Lab Results  Component Value Date   CHOL 177 06/11/2019   HDL 39.00 (L) 06/11/2019   LDLCALC 109 (H) 06/11/2019   LDLDIRECT 154.1 08/16/2012   TRIG 144.0 06/11/2019   CHOLHDL 5 06/11/2019   Lab Results  Component Value Date   WBC 10.1 06/11/2019   HGB 12.0 06/11/2019   HCT 36.1 06/11/2019   MCV 89.9 06/11/2019   PLT 323.0 06/11/2019   No results found for: IRON, TIBC, FERRITIN  Attestation Statements:   Reviewed by clinician on day of visit: allergies, medications, problem list, medical history, surgical history, family history, social history, and previous encounter notes.  Wilhemena Durie, am acting as Location manager for [employer], FNP-C.  I have reviewed the above documentation for accuracy and completeness, and I agree with the above. -  Georgianne Fick, FNP

## 2019-10-24 ENCOUNTER — Ambulatory Visit (INDEPENDENT_AMBULATORY_CARE_PROVIDER_SITE_OTHER): Payer: BC Managed Care – PPO | Admitting: Psychology

## 2019-10-24 DIAGNOSIS — F411 Generalized anxiety disorder: Secondary | ICD-10-CM

## 2019-10-25 ENCOUNTER — Telehealth (INDEPENDENT_AMBULATORY_CARE_PROVIDER_SITE_OTHER): Payer: BC Managed Care – PPO | Admitting: Family

## 2019-10-25 ENCOUNTER — Other Ambulatory Visit: Payer: Self-pay

## 2019-10-25 ENCOUNTER — Ambulatory Visit (INDEPENDENT_AMBULATORY_CARE_PROVIDER_SITE_OTHER)
Admission: RE | Admit: 2019-10-25 | Discharge: 2019-10-25 | Disposition: A | Discharge status: Home / Self Care | Payer: BC Managed Care – PPO | Source: Ambulatory Visit | Attending: Family | Admitting: Family

## 2019-10-25 ENCOUNTER — Telehealth: Payer: Self-pay | Admitting: Internal Medicine

## 2019-10-25 DIAGNOSIS — Z8701 Personal history of pneumonia (recurrent): Secondary | ICD-10-CM

## 2019-10-25 DIAGNOSIS — R053 Chronic cough: Secondary | ICD-10-CM

## 2019-10-25 DIAGNOSIS — R05 Cough: Secondary | ICD-10-CM

## 2019-10-25 DIAGNOSIS — R0602 Shortness of breath: Secondary | ICD-10-CM | POA: Diagnosis not present

## 2019-10-25 MED ORDER — AMOXICILLIN-POT CLAVULANATE 875-125 MG PO TABS: 1.0000 | ORAL_TABLET | Freq: Two times a day (BID) | ORAL | 0 refills | Status: AC

## 2019-10-25 MED ORDER — PREDNISONE 20 MG PO TABS
20.0000 mg | ORAL_TABLET | Freq: Every day | ORAL | 0 refills | Status: DC
Start: 2019-10-25 — End: 2019-11-07

## 2019-10-25 NOTE — Telephone Encounter (Signed)
New message:   Pt is calling and would like to know if her test results are back from her chest x-ray that she had done at lunch time today. Please advise.

## 2019-10-25 NOTE — Progress Notes (Signed)
[name] is a 49 y.o. female with the following history as recorded in EpicCare:  Patient Active Problem List   Diagnosis Date Noted  . GAD (generalized anxiety disorder) 06/06/2019  . Other hyperlipidemia 03/13/2019  . Skin abnormalities 11/25/2017  . 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 Skin tag 11/25/2017  . Essential hypertension 11/01/2017  . Chronic frontal sinusitis 10/30/2017  . Submandibular gland mass 10/30/2017  . Prediabetes 05/18/2017  . SOB (shortness of breath) 09/24/2016  . Palpitations 09/24/2016  . Family history of early CAD 03/23/2016  . SVT (supraventricular tachycardia) ([place]) 03/14/2016  . Adrenal nodule ([place]) 01/06/2016  . Supraumbilical hernia without gangrene and without obstruction 12/02/2015  . Morbid obesity ([place]) 04/09/2015  . Vitamin D deficiency   . Vitamin B12 deficiency   . Idiopathic intracranial hypertension 05/24/2013  . Anxiety   . GERD 01/28/2009  . POLYCYSTIC OVARIAN DISEASE 02/10/2007  . Class 3 severe obesity with serious comorbidity and body mass index (BMI) of 60.0 to 69.9 in adult ([place]) 02/10/2007  . Obstructive sleep apnea 02/10/2007  . Cough variant asthma 02/10/2007    Current Outpatient Medications  Medication Sig Dispense Refill  . ALPRAZolam (XANAX) 0.5 MG tablet Take 1 tablet (0.5 mg total) by mouth 3 times/day as needed-between meals & bedtime for anxiety. 40 tablet 0  . Cyanocobalamin (B-12 COMPLIANCE INJECTION) 1000 MCG/ML KIT Inject 1,000 mcg as directed every 30 (thirty) days.    [name] losartan (COZAAR) 25 MG tablet Take 1 tablet (25 mg total) by mouth daily. 30 tablet 0  . metFORMIN (GLUCOPHAGE) 500 MG tablet Take 1 tablet (500 mg total) by mouth 2 (two) times daily with a meal. 60 tablet 0  . metoprolol tartrate (LOPRESSOR) 25 MG tablet Take 0.5 tablets (12.5 mg total) by mouth 2 (two) times daily. 90 tablet 3  . topiramate (TOPAMAX) 50 MG tablet Take 1 tablet (50 mg total) by mouth daily. 30 tablet 0  .  amoxicillin-clavulanate (AUGMENTIN) 875-125 MG tablet Take 1 tablet by mouth 2 (two) times daily for 10 days. 20 tablet 0  . pantoprazole (PROTONIX) 40 MG tablet TAKE 1 TABLET BY MOUTH EVERY DAY 90 tablet 3  . PARoxetine (PAXIL) 10 MG tablet Take 1 tablet (10 mg total) by mouth daily. (Patient taking differently: Take 7.5 mg by mouth daily. Patient now takes 5 mg) 30 tablet 0  . predniSONE (DELTASONE) 20 MG tablet Take 1 tablet (20 mg total) by mouth daily with breakfast. 5 tablet 0  . UNABLE TO FIND Med Name: CPAP    . valACYclovir (VALTREX) 1000 MG tablet TAKE 2 TABLETS EVERY 12 HOURS FOR 1 DAY START ASAP AFTER SYTPTOM ONSET 12 tablet 3  . Vitamin D, Ergocalciferol, (DRISDOL) 1.25 MG (50000 UNIT) CAPS capsule TAKE 1 CAPSULE BY MOUTH ONE TIME PER WEEK 4 capsule 0   Current Facility-Administered Medications  Medication Dose Route Frequency Provider Last Rate Last Admin  . cyanocobalamin ((VITAMIN B-12)) injection 1,000 mcg  1,000 mcg Intramuscular Q30 days Binnie Rail, MD   1,000 mcg at 10/18/19 1937    Allergies: Doxycycline  Past Medical History:  Diagnosis Date  . Anxiety   . ASTHMA   . B12 deficiency   . DELAYED GASTRIC EMPTYING   . DEPRESSION   . GERD   . Glucose intolerance (impaired glucose tolerance) 04/09/2015  . Hypertension   . OBESITY   . OBSTRUCTIVE SLEEP APNEA   . POLYCYSTIC OVARIAN DISEASE   . Prediabetes   . SMOKER   .  SOB (shortness of breath)   . SVT (supraventricular tachycardia) ([place])   . Tachycardia   . Umbilical hernia   . Umbilical hernia   . Vitamin B12 deficiency 03/2015 dx   start IM replacement q 30d  . Vitamin D deficiency 03/2015 dx    Past Surgical History:  Procedure Laterality Date  . CESAREAN SECTION  97 & 98   x's 2  . Cocxyl removal  1995  . DILATION AND CURETTAGE OF UTERUS    . LAPAROSCOPY    . TUBAL LIGATION  1998    Family History  Problem Relation Age of Onset  . Breast cancer Mother        dx 11/2009  . Diabetes Mother   .  High blood pressure Mother   . Thyroid disease Mother   . Anxiety disorder Mother   . Obesity Mother   . Diabetes Maternal Aunt   . Prostate cancer Maternal Grandfather 16       also hx colon polyps  . Heart disease Paternal Grandmother   . Pancreatic cancer Paternal Grandmother 20  . High blood pressure Father   . High Cholesterol Father   . Depression Father   . Heart murmur Sister   . Adrenal disorder Neg Hx     Social History   Tobacco Use  . Smoking status: Light Tobacco Smoker    Packs/day: 0.25    Years: 30.00    Pack years: 7.50    Types: Cigarettes    Start date: 10/23/1987    Last attempt to quit: 10/23/2017    Years since quitting: 2.0  . Smokeless tobacco: Never Used  Substance Use Topics  . Alcohol use: Yes    Alcohol/week: 0.0 standard drinks    Comment: Rarely    Subjective:   I connected with [name] on 10/25/19 at 10:20 AM EDT by a telephone call and verified that I am speaking with the correct person using two identifiers.   I discussed the limitations of evaluation and management by telemedicine and the availability of in person appointments. The patient expressed understanding and agreed to proceed. Provider in office/ patient is at home; provider and patient are only 2 people on telephone call.   Complaining of worsening cough/ chest congestion; notes her symptoms originally started on July 3 with cold type symptoms; was concerned for possible COVID exposure and was seen at [facility] last week- had negative COVID test and physical exam was normal; Feels that as the week has progressed she is having a harder time breathing/ feels like congestion is moving into her chest; has been using OTC Coricidin with limited benefit; has had pneumonia in the past and is prone to pneumonia;    Objective:  There were no vitals filed for this visit.  Lungs: Respirations unlabored;  Neurologic: Alert and oriented; speech intact; face symmetrical; moves all  extremities well; CNII-XII intact without focal deficit   Assessment:  1. Persistent cough for 3 weeks or longer     Plan:  Concern for pneumonia; will update CXR today; will go ahead and start Augmentin 875 mg bid x 10 days and Prednisone 20 mg qd x 5 days; plan for in office re-check in 2 weeks, sooner prn.  Time spent 20 minutes   No follow-ups on file.  Orders Placed This Encounter  Procedures  . DG Chest 2 View    Standing Status:   Future    Standing Expiration Date:   10/24/2020  Order Specific Question:   Reason for Exam (SYMPTOM  OR DIAGNOSIS REQUIRED)    Answer:   cough x 3 weeks; rule out pneumonia    Order Specific Question:   Is patient pregnant?    Answer:   No    Order Specific Question:   Preferred imaging location?    Answer:   [facility]    Order Specific Question:   Radiology Contrast Protocol - do NOT remove file path    Answer:   [email or web address]    Requested Prescriptions   Signed Prescriptions Disp Refills  . amoxicillin-clavulanate (AUGMENTIN) 875-125 MG tablet 20 tablet 0    Sig: Take 1 tablet by mouth 2 (two) times daily for 10 days.  . predniSONE (DELTASONE) 20 MG tablet 5 tablet 0    Sig: Take 1 tablet (20 mg total) by mouth daily with breakfast.

## 2019-10-27 ENCOUNTER — Other Ambulatory Visit (INDEPENDENT_AMBULATORY_CARE_PROVIDER_SITE_OTHER): Payer: Self-pay | Admitting: Family Medicine

## 2019-10-30 ENCOUNTER — Encounter: Payer: Self-pay | Admitting: Family

## 2019-10-31 ENCOUNTER — Ambulatory Visit (INDEPENDENT_AMBULATORY_CARE_PROVIDER_SITE_OTHER): Payer: BC Managed Care – PPO | Admitting: Psychology

## 2019-10-31 DIAGNOSIS — F411 Generalized anxiety disorder: Secondary | ICD-10-CM

## 2019-11-04 ENCOUNTER — Other Ambulatory Visit: Payer: Self-pay

## 2019-11-04 ENCOUNTER — Encounter: Payer: Self-pay | Admitting: Internal Medicine

## 2019-11-04 ENCOUNTER — Other Ambulatory Visit (HOSPITAL_COMMUNITY)
Admission: RE | Admit: 2019-11-04 | Discharge: 2019-11-04 | Disposition: A | Discharge status: Home / Self Care | Payer: BC Managed Care – PPO | Source: Ambulatory Visit | Attending: Obstetrics and Gynecology | Admitting: Obstetrics and Gynecology

## 2019-11-04 ENCOUNTER — Ambulatory Visit: Payer: BC Managed Care – PPO | Admitting: Obstetrics and Gynecology

## 2019-11-04 ENCOUNTER — Encounter: Payer: Self-pay | Admitting: Obstetrics and Gynecology

## 2019-11-04 VITALS — BP 161/70 | HR 75 | Ht 65.0 in | Wt 373.0 lb

## 2019-11-04 DIAGNOSIS — R102 Pelvic and perineal pain: Secondary | ICD-10-CM | POA: Diagnosis not present

## 2019-11-04 DIAGNOSIS — N760 Acute vaginitis: Secondary | ICD-10-CM | POA: Diagnosis not present

## 2019-11-04 DIAGNOSIS — Z124 Encounter for screening for malignant neoplasm of cervix: Secondary | ICD-10-CM | POA: Diagnosis present

## 2019-11-04 NOTE — Progress Notes (Signed)
49 yo here for the evaluation of vaginitis and vaginal spotting. Patient reports normal onset of her period which lasted 2 days longer than usual. Last week she noted some vaginal spotting when she wipes along with vaginal irritation. She was recently treated for bronchitis with antibiotics and steroid taper. Patient also reports intermittent lower abdominal pain which spontaneously resolve without medication. Patient also received her first dose of covid vaccine early in July and is wondering if all her symptoms are related to the vaccine.  Past Medical History:  Diagnosis Date  . Anxiety   . ASTHMA   . B12 deficiency   . DELAYED GASTRIC EMPTYING   . DEPRESSION   . GERD   . Glucose intolerance (impaired glucose tolerance) 04/09/2015  . Hypertension   . OBESITY   . OBSTRUCTIVE SLEEP APNEA   . POLYCYSTIC OVARIAN DISEASE   . Prediabetes   . SMOKER   . SOB (shortness of breath)   . SVT (supraventricular tachycardia) ([place])   . Tachycardia   . Umbilical hernia   . Umbilical hernia   . Vitamin B12 deficiency 03/2015 dx   start IM replacement q 30d  . Vitamin D deficiency 03/2015 dx   Past Surgical History:  Procedure Laterality Date  . CESAREAN SECTION  97 & 98   x's 2  . Cocxyl removal  1995  . DILATION AND CURETTAGE OF UTERUS    . LAPAROSCOPY    . TUBAL LIGATION  1998   Family History  Problem Relation Age of Onset  . Breast cancer Mother        dx 11/2009  . Diabetes Mother   . High blood pressure Mother   . Thyroid disease Mother   . Anxiety disorder Mother   . Obesity Mother   . Diabetes Maternal Aunt   . Prostate cancer Maternal Grandfather 63       also hx colon polyps  . Heart disease Paternal Grandmother   . Pancreatic cancer Paternal Grandmother 41  . High blood pressure Father   . High Cholesterol Father   . Depression Father   . Heart murmur Sister   . Adrenal disorder Neg Hx    Social History   Tobacco Use  . Smoking status: Light Tobacco Smoker     Packs/day: 0.25    Years: 30.00    Pack years: 7.50    Types: Cigarettes    Start date: 10/23/1987    Last attempt to quit: 10/23/2017    Years since quitting: 2.0  . Smokeless tobacco: Never Used  Vaping Use  . Vaping Use: Never used  Substance Use Topics  . Alcohol use: Yes    Alcohol/week: 0.0 standard drinks    Comment: Rarely  . Drug use: No   ROS See pertinent in HPI Blood pressure (!) 161/70, pulse 75, height 5' 5"  (1.651 m), weight (!) 373 lb (169.2 kg), last menstrual period 10/24/2019. GENERAL: Well-developed, well-nourished female in no acute distress.  ABDOMEN: Soft, nontender, nondistended. No organomegaly. PELVIC: Normal external female genitalia. Vagina is slightly erythematous and rugated.  Slight tenderness at introitus. Normal discharge. Normal appearing cervix. Uterus is normal in size. No adnexal mass or tenderness. EXTREMITIES: No cyanosis, clubbing, or edema, 2+ distal pulses.  A/P 49 yo with vaginitis and lower pelvic pain - vaginal cultures collected - pap smear collected - Pelvic ultrasound ordered - Patient will be contacted with abnormal results - RTC prn - Patient with normal mammogram 06/2019

## 2019-11-04 NOTE — Progress Notes (Signed)
Pt. States she is having vaginal bleeding that is unusual for her and this is occurring almost everyday. Pt. States she seen "clots" in the toilet. Pt. Also states she is seeing "mucous". Pt. States she serve pain lower left pelvic area. Pt. Also states she has lower abdominal pain. Pt. States she did have some itchy this morning. Pt. Is sexually active with one female partner. Pt. Has tubes tied.

## 2019-11-05 LAB — CERVICOVAGINAL ANCILLARY ONLY
Bacterial Vaginitis (gardnerella): NEGATIVE
Candida Glabrata: NEGATIVE
Candida Vaginitis: POSITIVE — AB
Chlamydia: NEGATIVE
Comment: NEGATIVE
Comment: NEGATIVE
Comment: NEGATIVE
Comment: NEGATIVE
Comment: NEGATIVE
Comment: NORMAL
Neisseria Gonorrhea: NEGATIVE
Trichomonas: NEGATIVE

## 2019-11-06 ENCOUNTER — Other Ambulatory Visit: Payer: Self-pay | Admitting: *Deleted

## 2019-11-06 DIAGNOSIS — B3731 Acute candidiasis of vulva and vagina: Secondary | ICD-10-CM

## 2019-11-06 DIAGNOSIS — B373 Candidiasis of vulva and vagina: Secondary | ICD-10-CM

## 2019-11-06 MED ORDER — TERCONAZOLE 0.4 % VA CREA: 1.0000 | TOPICAL_CREAM | Freq: Every day | VAGINAL | 0 refills | Status: DC

## 2019-11-06 MED ORDER — FLUCONAZOLE 150 MG PO TABS: 150.0000 mg | ORAL_TABLET | Freq: Once | ORAL | 0 refills | Status: AC

## 2019-11-06 NOTE — Addendum Note (Signed)
Addended by: Mora Bellman on: 11/06/2019 05:44 PM   Modules accepted: Orders

## 2019-11-06 NOTE — Progress Notes (Signed)
Pt called to office for treatment for yeast infection.  Pt had +results she seen on mychart. Pt was given treatment today- Diflucan and Terazol sent. Pt states she is having extreme itching and external irritation.  Advised she may take Diflucan today and use Terazol externally, if no change in symptoms after a couple days she may use Terzaol vaginally or she may use Monistat 1 if she chooses.   Pt advised to contact office if her symptoms do not resolve or they get worse.   Pt states understanding.

## 2019-11-07 ENCOUNTER — Ambulatory Visit (INDEPENDENT_AMBULATORY_CARE_PROVIDER_SITE_OTHER): Payer: BC Managed Care – PPO | Admitting: Psychology

## 2019-11-07 DIAGNOSIS — F411 Generalized anxiety disorder: Secondary | ICD-10-CM | POA: Diagnosis not present

## 2019-11-07 LAB — CYTOLOGY - PAP
Adequacy: ABSENT
Comment: NEGATIVE
Diagnosis: NEGATIVE
High risk HPV: NEGATIVE

## 2019-11-07 NOTE — Patient Instructions (Addendum)
  Medications reviewed and updated.  Changes include :   Take the diflucan as prescribed.     Start taking the xanax three times a day. After you get the vaccine continue three times a day if needed.  Then go to twice a day for a few days then once day for a few days and then take as needed.     Your prescription(s) have been submitted to your pharmacy. Please take as directed and contact our office if you believe you are having problem(s) with the medication(s).

## 2019-11-07 NOTE — Progress Notes (Signed)
Subjective:    Patient ID: [name], female    DOB: 02-07-1971, 49 y.o.   MRN: [id number]  HPI The patient is here for an acute visit.  ? [place]:   [place] [facility] [place]  - abx - bactrim.  cx - neg, but blood.  Was having pain at that time - left lower back/side  July 3rd - she did not feel herself.  She had diarrhea.  ? exposure to covid - tested neg.    Had vaccine on 10th.  Had ?period - lasted 2 extra days Kept spotting after that.  She is not sure where the blood was coming from.  She got sick with cold symptoms and saw [name].  She did treat her for bacterial infection with antibiotics and prednisone.  Her chest x-ray at that time was negative.    Bleeding got worse.  Went to gyn -- pap done.  mon will have vaginal [place].  No vaginal burning or discharge at that time.    Monday evening she started having a little itching.  Now has vulvar itchy/swelling/ irriation, nausea, dizzy.  She denies any discharge.  She is still having some bleeding, but still not sure where it is coming from-if it is in the urine or vagina.   Now pain in right flank.  When she was first found to have blood in her urine she did have some left flank pain.  CT 7/1 reviewed-no evidence of stones in her kidneys.  No comment on bladder.  She is experiencing intense anxiety and panic attacks.  This is focused around getting the second Covid vaccine.  She did have the first 1 and she is going to get the second 1 tomorrow.  She does want to get the vaccine, but is so concerned about having possible side effects.  She does not know what to do.  She is almost at the point of not being able to function because of the intense anxiety.    Medications and allergies reviewed with patient and updated if appropriate.  Patient Active Problem List   Diagnosis Date Noted  . Vaginal candidiasis 11/08/2019  . GAD (generalized anxiety disorder) 06/06/2019  . Other hyperlipidemia 03/13/2019  . Recurrent  herpes labialis 11/25/2017  . Essential hypertension 11/01/2017  . Chronic frontal sinusitis 10/30/2017  . Submandibular gland mass 10/30/2017  . Prediabetes 05/18/2017  . SOB (shortness of breath) 09/24/2016  . Palpitations 09/24/2016  . Family history of early CAD 03/23/2016  . SVT (supraventricular tachycardia) ([place]) 03/14/2016  . Adrenal nodule ([place]) 01/06/2016  . Supraumbilical hernia without gangrene and without obstruction 12/02/2015  . Morbid obesity ([place]) 04/09/2015  . Vitamin D deficiency   . Vitamin B12 deficiency   . Idiopathic intracranial hypertension 05/24/2013  . Anxiety   . GERD 01/28/2009  . POLYCYSTIC OVARIAN DISEASE 02/10/2007  . Class 3 severe obesity with serious comorbidity and body mass index (BMI) of 60.0 to 69.9 in adult ([place]) 02/10/2007  . Obstructive sleep apnea 02/10/2007  . Cough variant asthma 02/10/2007    Current Outpatient Medications on File Prior to Visit  Medication Sig Dispense Refill  . Cholecalciferol 50 MCG (2000 UT) TABS Take by mouth.    . Cyanocobalamin (B-12 COMPLIANCE INJECTION) 1000 MCG/ML KIT Inject 1,000 mcg as directed every 30 (thirty) days.    [name] ipratropium (ATROVENT) 0.06 % nasal spray two sprays by Both Nostrils route 3 (three) times a day as needed for Rhinitis.    [name]  losartan (COZAAR) 25 MG tablet Take 1 tablet (25 mg total) by mouth daily. 30 tablet 0  . metFORMIN (GLUCOPHAGE) 500 MG tablet Take 1 tablet (500 mg total) by mouth 2 (two) times daily with a meal. 60 tablet 0  . metoprolol tartrate (LOPRESSOR) 25 MG tablet Take 0.5 tablets (12.5 mg total) by mouth 2 (two) times daily. 90 tablet 3  . Multiple Vitamin (MULTIVITAMIN) tablet Take 1 tablet by mouth daily.    . pantoprazole (PROTONIX) 40 MG tablet TAKE 1 TABLET BY MOUTH EVERY DAY 90 tablet 3  . PARoxetine (PAXIL) 10 MG tablet Take 1 tablet (10 mg total) by mouth daily. (Patient taking differently: Take 7.5 mg by mouth daily. Patient now takes 5 mg) 30 tablet 0  .  terconazole (TERAZOL 7) 0.4 % vaginal cream Place 1 applicator vaginally at bedtime. 45 g 0  . topiramate (TOPAMAX) 50 MG tablet Take 1 tablet (50 mg total) by mouth daily. 30 tablet 0  . UNABLE TO FIND Med Name: CPAP    . valACYclovir (VALTREX) 1000 MG tablet TAKE 2 TABLETS EVERY 12 HOURS FOR 1 DAY START ASAP AFTER SYTPTOM ONSET 12 tablet 3  . Vitamin D, Ergocalciferol, (DRISDOL) 1.25 MG (50000 UNIT) CAPS capsule TAKE 1 CAPSULE BY MOUTH ONE TIME PER WEEK 4 capsule 0   Current Facility-Administered Medications on File Prior to Visit  Medication Dose Route Frequency Provider Last Rate Last Admin  . cyanocobalamin ((VITAMIN B-12)) injection 1,000 mcg  1,000 mcg Intramuscular Q30 days Binnie Rail, MD   1,000 mcg at 10/18/19 9381    Past Medical History:  Diagnosis Date  . Anxiety   . ASTHMA   . B12 deficiency   . DELAYED GASTRIC EMPTYING   . DEPRESSION   . GERD   . Glucose intolerance (impaired glucose tolerance) 04/09/2015  . Hypertension   . OBESITY   . OBSTRUCTIVE SLEEP APNEA   . POLYCYSTIC OVARIAN DISEASE   . Prediabetes   . SMOKER   . SOB (shortness of breath)   . SVT (supraventricular tachycardia) ([place])   . Tachycardia   . Umbilical hernia   . Umbilical hernia   . Vitamin B12 deficiency 03/2015 dx   start IM replacement q 30d  . Vitamin D deficiency 03/2015 dx    Past Surgical History:  Procedure Laterality Date  . CESAREAN SECTION  97 & 98   x's 2  . Cocxyl removal  1995  . DILATION AND CURETTAGE OF UTERUS    . LAPAROSCOPY    . TUBAL LIGATION  1998    Social History   Socioeconomic History  . Marital status: Married    Spouse name: [name]  . Number of children: 2  . Years of education: Not on file  . Highest education level: Not on file  Occupational History  . 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 Smoking status: Light Tobacco Smoker    Packs/day: 0.25    Years: 30.00    Pack years: 7.50     Types: Cigarettes    Start date: 10/23/1987    Last attempt to quit: 10/23/2017    Years since quitting: 2.0  . Smokeless tobacco: Never Used  Vaping Use  . Vaping Use: Never used  Substance and Sexual Activity  . Alcohol use: Yes    Alcohol/week: 0.0 standard drinks    Comment: Rarely  . Drug use: No  . Sexual  activity: Not on file  Other Topics Concern  . Not on file  Social History Narrative   Lives with husband & 2 kids from 1st marriage. (1st husband expired 1998-leukemia) work as Merchandiser, retail, but currently unemployed   Social Determinants of Radio broadcast assistant Strain:   . Difficulty of Paying Living Expenses:   Food Insecurity:   . Worried About Charity fundraiser in the Last Year:   . Arboriculturist in the Last Year:   Transportation Needs:   . Film/video editor (Medical):   [name] Lack of Transportation (Non-Medical):   Physical Activity:   . Days of Exercise per Week:   . Minutes of Exercise per Session:   Stress:   . Feeling of Stress :   Social Connections:   . Frequency of Communication with Friends and Family:   . Frequency of Social Gatherings with Friends and Family:   . Attends Religious Services:   . Active Member of Clubs or Organizations:   . Attends Archivist Meetings:   [name] Marital Status:     Family History  Problem Relation Age of Onset  . Breast cancer Mother        dx 11/2009  . Diabetes Mother   . High blood pressure Mother   . Thyroid disease Mother   . Anxiety disorder Mother   . Obesity Mother   . Diabetes Maternal Aunt   . Prostate cancer Maternal Grandfather 54       also hx colon polyps  . Heart disease Paternal Grandmother   . Pancreatic cancer Paternal Grandmother 64  . High blood pressure Father   . High Cholesterol Father   . Depression Father   . Heart murmur Sister   . Adrenal disorder Neg Hx     Review of Systems  Constitutional: Negative for fever.  Respiratory: Positive for shortness  of breath.   Cardiovascular: Positive for chest pain and palpitations.  Gastrointestinal: Positive for nausea.  Genitourinary: Positive for flank pain (Right flank) and vaginal discharge.       Vaginal irritation, swelling  Psychiatric/Behavioral: Positive for sleep disturbance. The patient is nervous/anxious.        Objective:   Vitals:   11/08/19 0936  BP: (!) 148/78  Pulse: 74  Temp: 98 F (36.7 C)  SpO2: 97%   BP Readings from Last 3 Encounters:  11/08/19 (!) 148/78  11/04/19 (!) 161/70  10/17/19 107/71   Wt Readings from Last 3 Encounters:  11/08/19 (!) 365 lb (165.6 kg)  11/04/19 (!) 373 lb (169.2 kg)  10/17/19 (!) 363 lb (164.7 kg)   Body mass index is 60.74 kg/m.   Physical Exam Constitutional:      General: She is in acute distress (Related to anxiety).     Appearance: Normal appearance. She is obese. She is diaphoretic (Related to anxiety). She is not ill-appearing.  Skin:    General: Skin is warm.  Neurological:     Mental Status: She is alert.  Psychiatric:        Behavior: Behavior normal.        Thought Content: Thought content normal.        Judgment: Judgment normal.     Comments: She is incredibly anxious and crying throughout most of the visit.            Assessment & Plan:    See Problem List for Assessment and Plan of chronic medical problems.  This visit occurred during the SARS-CoV-2 public health emergency.  Safety protocols were in place, including screening questions prior to the visit, additional usage of staff PPE, and extensive cleaning of exam room while observing appropriate contact time as indicated for disinfecting solutions.

## 2019-11-08 ENCOUNTER — Other Ambulatory Visit: Payer: Self-pay

## 2019-11-08 ENCOUNTER — Ambulatory Visit (INDEPENDENT_AMBULATORY_CARE_PROVIDER_SITE_OTHER): Payer: BC Managed Care – PPO | Admitting: Internal Medicine

## 2019-11-08 ENCOUNTER — Encounter: Payer: Self-pay | Admitting: Internal Medicine

## 2019-11-08 VITALS — BP 148/78 | HR 74 | Temp 98.0°F | Ht 65.0 in | Wt 365.0 lb

## 2019-11-08 DIAGNOSIS — R319 Hematuria, unspecified: Secondary | ICD-10-CM | POA: Insufficient documentation

## 2019-11-08 DIAGNOSIS — I10 Essential (primary) hypertension: Secondary | ICD-10-CM

## 2019-11-08 DIAGNOSIS — B3731 Acute candidiasis of vulva and vagina: Secondary | ICD-10-CM

## 2019-11-08 DIAGNOSIS — B373 Candidiasis of vulva and vagina: Secondary | ICD-10-CM

## 2019-11-08 DIAGNOSIS — R7303 Prediabetes: Secondary | ICD-10-CM

## 2019-11-08 DIAGNOSIS — F419 Anxiety disorder, unspecified: Secondary | ICD-10-CM | POA: Diagnosis not present

## 2019-11-08 DIAGNOSIS — R300 Dysuria: Secondary | ICD-10-CM | POA: Diagnosis not present

## 2019-11-08 LAB — POC URINALSYSI DIPSTICK (AUTOMATED)
Bilirubin, UA: NEGATIVE
Glucose, UA: NEGATIVE
Ketones, UA: NEGATIVE
Leukocytes, UA: NEGATIVE
Nitrite, UA: NEGATIVE
Protein, UA: NEGATIVE
Spec Grav, UA: 1.02 (ref 1.010–1.025)
Urobilinogen, UA: 0.2 E.U./dL
pH, UA: 6 (ref 5.0–8.0)

## 2019-11-08 MED ORDER — ALPRAZOLAM 0.5 MG PO TABS: 0.5000 mg | ORAL_TABLET | Freq: Three times a day (TID) | ORAL | 0 refills | Status: DC | PRN

## 2019-11-08 MED ORDER — FLUCONAZOLE 150 MG PO TABS: 150.0000 mg | ORAL_TABLET | Freq: Once | ORAL | 0 refills | Status: AC

## 2019-11-08 NOTE — Assessment & Plan Note (Signed)
Acute Symptoms consistent with vulvar/vaginal candidiasis She did take 1 Diflucan from GYN and that did help Advised her to take another pill today and again in 72 hours She has the transvaginal ultrasound on Monday

## 2019-11-08 NOTE — Assessment & Plan Note (Signed)
Chronic Blood pressure is not well controlled today, but she is under incredible stress and appears to be very anxious here Continue current medications

## 2019-11-08 NOTE — Assessment & Plan Note (Signed)
Acute on chronic Her anxiety is incredibly high and not currently controlled She has been trying to taper off the Paxil and does take alprazolam as needed She wants to get the vaccine tomorrow and she knows that if she just gets through that her anxiety will improve Advised her to take alprazolam 3 times daily today, tomorrow and the next day if needed-slowly taper back to once a day and then as needed She will come back to see me next week, but call sooner with any concerns or questions Part of the reason she is tapering off of the Paxil is to see if it helps her with her weight loss efforts-at this point I do question whether it is worth it or not

## 2019-11-08 NOTE — Assessment & Plan Note (Signed)
Acute She does have evidence in her urine dip here without evidence of infection No antibiotic needed-we will send for culture She is unsure if the blood is in her urine or if it is vaginal bleeding She has seen GYN and will have a pelvic ultrasound on Monday which will hopefully help clarify this CT from earlier this month did not reveal any kidney stones If GYN work-up is negative will need to see urology

## 2019-11-09 ENCOUNTER — Encounter: Payer: Self-pay | Admitting: Internal Medicine

## 2019-11-09 ENCOUNTER — Ambulatory Visit: Payer: BC Managed Care – PPO | Attending: Internal Medicine

## 2019-11-09 DIAGNOSIS — Z23 Encounter for immunization: Secondary | ICD-10-CM

## 2019-11-09 NOTE — Progress Notes (Signed)
   Covid-19 Vaccination Clinic  Name:  [name]    MRN: [id number] DOB: [birth year]  11/09/2019  [name] was observed post Covid-19 immunization for 30 minutes based on pre-vaccination screening without incident. She was provided with Vaccine Information Sheet and instruction to access the V-Safe system.   [name] was instructed to call 911 with any severe reactions post vaccine: [name] Difficulty breathing  . Swelling of face and throat  . A fast heartbeat  . A bad rash all over body  . Dizziness and weakness   Immunizations Administered    Name Date Dose VIS Date Route   Pfizer COVID-19 Vaccine 11/09/2019 10:30 AM 0.3 mL 06/05/2018 Intramuscular   Manufacturer: [employer], [employer]   Lot: [id number]   [place]: [address]-7

## 2019-11-10 ENCOUNTER — Encounter: Payer: Self-pay | Admitting: Internal Medicine

## 2019-11-10 LAB — URINE CULTURE

## 2019-11-10 MED ORDER — CEPHALEXIN 500 MG PO CAPS
500.0000 mg | ORAL_CAPSULE | Freq: Two times a day (BID) | ORAL | 0 refills | Status: DC
Start: 2019-11-10 — End: 2019-11-19

## 2019-11-10 NOTE — Addendum Note (Signed)
Addended by: Binnie Rail on: 11/10/2019 01:42 PM   Modules accepted: Orders

## 2019-11-11 ENCOUNTER — Ambulatory Visit
Admission: RE | Admit: 2019-11-11 | Discharge: 2019-11-11 | Disposition: A | Discharge status: Home / Self Care | Payer: BC Managed Care – PPO | Source: Ambulatory Visit | Attending: Obstetrics and Gynecology | Admitting: Obstetrics and Gynecology

## 2019-11-11 ENCOUNTER — Encounter: Payer: Self-pay | Admitting: Internal Medicine

## 2019-11-11 DIAGNOSIS — R102 Pelvic and perineal pain: Secondary | ICD-10-CM

## 2019-11-11 DIAGNOSIS — R319 Hematuria, unspecified: Secondary | ICD-10-CM

## 2019-11-12 ENCOUNTER — Telehealth: Payer: Self-pay

## 2019-11-12 NOTE — Telephone Encounter (Signed)
Agree with ems eval.  Likely like related to abx.  Possibly related to bleeding.

## 2019-11-12 NOTE — Telephone Encounter (Signed)
Per [facility], caller states Dr. Quay Burow sent her an antibiotic yesterday for UTI - her blood pressure was 96/58 and not sure if the medication is the cause of this. Caller states she's taking Cethalexin 549m for the bacteria ( Klebsiella pneumoniae ). Caller states that she still has vaginal bleeding. Caller states that she does have chills and "feels off" and head hurts. Second covid vaccine was on Saturday.  Advised to call EMS 911 now.

## 2019-11-13 ENCOUNTER — Ambulatory Visit (INDEPENDENT_AMBULATORY_CARE_PROVIDER_SITE_OTHER): Payer: BC Managed Care – PPO | Admitting: Psychology

## 2019-11-13 DIAGNOSIS — F411 Generalized anxiety disorder: Secondary | ICD-10-CM

## 2019-11-13 NOTE — Telephone Encounter (Signed)
I guess you can forward it to [place]

## 2019-11-14 NOTE — Progress Notes (Signed)
Subjective:    Patient ID: [name], female    DOB: 01-Apr-1971, 49 y.o.   MRN: [id number]  HPI The patient is here for follow up of their chronic medical problems, including B12 def, anxiety, UTI, gross hematuria.  She is still having gross hematuria.  GYN w/u has been negative to far.  Every time she urinates there is blood.  There was some confusion at first where the blood is coming from.  transvag and pelvic US - unremarkable.  Gyn wants to do an endometrial bx.   UTI on keflex - had hypotension but EMS came out and her BP was actually high - her BP cuff was not accurate.      CT scan 10/10/2019:   Kidneys and upper ureter were normal.    Medications and allergies reviewed with patient and updated if appropriate.  Patient Active Problem List   Diagnosis Date Noted  . Vaginal candidiasis 11/08/2019  . Blood in urine 11/08/2019  . GAD (generalized anxiety disorder) 06/06/2019  . Other hyperlipidemia 03/13/2019  . Adrenal adenoma, left 10/10/2018  . 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 Essential hypertension 11/01/2017  . Chronic frontal sinusitis 10/30/2017  . Submandibular gland mass 10/30/2017  . Prediabetes 05/18/2017  . SOB (shortness of breath) 09/24/2016  . Palpitations 09/24/2016  . Family history of early CAD 03/23/2016  . SVT (supraventricular tachycardia) ([place]) 03/14/2016  . Adrenal nodule ([place]) 01/06/2016  . Supraumbilical hernia without gangrene and without obstruction 12/02/2015  . Morbid obesity ([place]) 04/09/2015  . Vitamin D deficiency   . Vitamin B12 deficiency   . Idiopathic intracranial hypertension 05/24/2013  . Anxiety   . GERD 01/28/2009  . POLYCYSTIC OVARIAN DISEASE 02/10/2007  . Class 3 severe obesity with serious comorbidity and body mass index (BMI) of 60.0 to 69.9 in adult ([place]) 02/10/2007  . Obstructive sleep apnea 02/10/2007  . Cough variant asthma 02/10/2007    Current Outpatient Medications on File Prior to Visit  Medication  Sig Dispense Refill  . ALPRAZolam (XANAX) 0.5 MG tablet Take 1 tablet (0.5 mg total) by mouth 3 (three) times daily as needed for anxiety. 60 tablet 0  . cephALEXin (KEFLEX) 500 MG capsule Take 1 capsule (500 mg total) by mouth 2 (two) times daily. 14 capsule 0  . Cholecalciferol 50 MCG (2000 UT) TABS Take by mouth.    . Cyanocobalamin (B-12 COMPLIANCE INJECTION) 1000 MCG/ML KIT Inject 1,000 mcg as directed every 30 (thirty) days.    [name] ipratropium (ATROVENT) 0.06 % nasal spray two sprays by Both Nostrils route 3 (three) times a day as needed for Rhinitis.    [name] losartan (COZAAR) 25 MG tablet Take 1 tablet (25 mg total) by mouth daily. 30 tablet 0  . metFORMIN (GLUCOPHAGE) 500 MG tablet Take 1 tablet (500 mg total) by mouth 2 (two) times daily with a meal. 60 tablet 0  . metoprolol tartrate (LOPRESSOR) 25 MG tablet Take 0.5 tablets (12.5 mg total) by mouth 2 (two) times daily. 90 tablet 3  . Multiple Vitamin (MULTIVITAMIN) tablet Take 1 tablet by mouth daily.    . pantoprazole (PROTONIX) 40 MG tablet TAKE 1 TABLET BY MOUTH EVERY DAY 90 tablet 3  . PARoxetine (PAXIL) 10 MG tablet Take 1 tablet (10 mg total) by mouth daily. (Patient taking differently: Take 7.5 mg by mouth daily. Patient now takes 5 mg) 30 tablet 0  . terconazole (TERAZOL 7) 0.4 % vaginal cream Place 1 applicator vaginally at bedtime. 45 g  0  . topiramate (TOPAMAX) 50 MG tablet Take 1 tablet (50 mg total) by mouth daily. 30 tablet 0  . UNABLE TO FIND Med Name: CPAP    . valACYclovir (VALTREX) 1000 MG tablet TAKE 2 TABLETS EVERY 12 HOURS FOR 1 DAY START ASAP AFTER SYTPTOM ONSET 12 tablet 3  . Vitamin D, Ergocalciferol, (DRISDOL) 1.25 MG (50000 UNIT) CAPS capsule TAKE 1 CAPSULE BY MOUTH ONE TIME PER WEEK 4 capsule 0   Current Facility-Administered Medications on File Prior to Visit  Medication Dose Route Frequency Provider Last Rate Last Admin  . cyanocobalamin ((VITAMIN B-12)) injection 1,000 mcg  1,000 mcg Intramuscular Q30 days  Binnie Rail, MD   1,000 mcg at 10/18/19 8921    Past Medical History:  Diagnosis Date  . Anxiety   . ASTHMA   . B12 deficiency   . DELAYED GASTRIC EMPTYING   . DEPRESSION   . GERD   . Glucose intolerance (impaired glucose tolerance) 04/09/2015  . Hypertension   . OBESITY   . OBSTRUCTIVE SLEEP APNEA   . POLYCYSTIC OVARIAN DISEASE   . Prediabetes   . SMOKER   . SOB (shortness of breath)   . SVT (supraventricular tachycardia) ([place])   . Tachycardia   . Umbilical hernia   . Umbilical hernia   . Vitamin B12 deficiency 03/2015 dx   start IM replacement q 30d  . Vitamin D deficiency 03/2015 dx    Past Surgical History:  Procedure Laterality Date  . CESAREAN SECTION  97 & 98   x's 2  . Cocxyl removal  1995  . DILATION AND CURETTAGE OF UTERUS    . LAPAROSCOPY    . TUBAL LIGATION  1998    Social History   Socioeconomic History  . Marital status: Married    Spouse name: Miquel Lamson  . Number of children: 2  . Years of education: Not on file  . Highest education level: Not on file  Occupational History  . 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 Smoking status: Light Tobacco Smoker    Packs/day: 0.25    Years: 30.00    Pack years: 7.50    Types: Cigarettes    Start date: 10/23/1987    Last attempt to quit: 10/23/2017    Years since quitting: 2.0  . Smokeless tobacco: Never Used  Vaping Use  . Vaping Use: Never used  Substance and Sexual Activity  . Alcohol use: Yes    Alcohol/week: 0.0 standard drinks    Comment: Rarely  . Drug use: No  . Sexual activity: Not on file  Other Topics Concern  . Not on file  Social History Narrative   Lives with husband & 2 kids from 1st marriage. (1st husband expired 1998-leukemia) work as Merchandiser, retail, but currently unemployed   Social Determinants of Radio broadcast assistant Strain:   . Difficulty of Paying Living Expenses:   Food Insecurity:   . Worried About  Charity fundraiser in the Last Year:   . Arboriculturist in the Last Year:   Transportation Needs:   . Film/video editor (Medical):   [name] Lack of Transportation (Non-Medical):   Physical Activity:   . Days of Exercise per Week:   . Minutes of Exercise per Session:   Stress:   . Feeling of Stress :   Social Connections:   . Frequency of Communication  with Friends and Family:   . Frequency of Social Gatherings with Friends and Family:   . Attends Religious Services:   . Active Member of Clubs or Organizations:   . Attends Archivist Meetings:   [name] Marital Status:     Family History  Problem Relation Age of Onset  . Breast cancer Mother        dx 11/2009  . Diabetes Mother   . High blood pressure Mother   . Thyroid disease Mother   . Anxiety disorder Mother   . Obesity Mother   . Diabetes Maternal Aunt   . Prostate cancer Maternal Grandfather 34       also hx colon polyps  . Heart disease Paternal Grandmother   . Pancreatic cancer Paternal Grandmother 58  . High blood pressure Father   . High Cholesterol Father   . Depression Father   . Heart murmur Sister   . Adrenal disorder Neg Hx     Review of Systems  Constitutional: Positive for chills and fever (100.2 tues, 99.2 today (ear)).  Gastrointestinal: Positive for abdominal pain (diffuse - ? hernia, pressure in suprapubic region) and nausea.  Genitourinary: Positive for hematuria and pelvic pain (suprapubic pressure). Negative for difficulty urinating, dysuria and frequency.       Vulvar itch  Musculoskeletal: Positive for back pain (lower back).  Neurological: Positive for light-headedness (one night - BP was elevated). Negative for headaches.       Objective:   Vitals:   11/15/19 0750  BP: 140/74  Pulse: 79  Temp: 98.6 F (37 C)  SpO2: 99%   BP Readings from Last 3 Encounters:  11/15/19 140/74  11/08/19 (!) 148/78  11/04/19 (!) 161/70   Wt Readings from Last 3 Encounters:  11/15/19 (!) 368  lb (166.9 kg)  11/08/19 (!) 365 lb (165.6 kg)  11/04/19 (!) 373 lb (169.2 kg)   Body mass index is 61.24 kg/m.   Physical Exam    Constitutional: Appears well-developed and well-nourished. No distress.  HENT:  Head: Normocephalic and atraumatic.  Cardiovascular: Normal rate, regular rhythm and normal heart sounds.   No murmur heard.No edema Pulmonary/Chest: Effort normal and breath sounds normal. No respiratory distress. No has no wheezes. No rales. Abdomen: Soft, obese, no tenderness on palpation, no CVA tenderness Skin: Skin is warm and dry. Not diaphoretic.  Psychiatric: Normal mood and affect. Behavior is normal.      Assessment & Plan:    See Problem List for Assessment and Plan of chronic medical problems.    This visit occurred during the SARS-CoV-2 public health emergency.  Safety protocols were in place, including screening questions prior to the visit, additional usage of staff PPE, and extensive cleaning of exam room while observing appropriate contact time as indicated for disinfecting solutions.

## 2019-11-15 ENCOUNTER — Encounter: Payer: Self-pay | Admitting: Internal Medicine

## 2019-11-15 ENCOUNTER — Telehealth: Payer: Self-pay | Admitting: Internal Medicine

## 2019-11-15 ENCOUNTER — Other Ambulatory Visit: Payer: Self-pay

## 2019-11-15 ENCOUNTER — Ambulatory Visit (INDEPENDENT_AMBULATORY_CARE_PROVIDER_SITE_OTHER): Payer: BC Managed Care – PPO | Admitting: Internal Medicine

## 2019-11-15 VITALS — BP 140/74 | HR 79 | Temp 98.6°F | Ht 65.0 in | Wt 368.0 lb

## 2019-11-15 DIAGNOSIS — I10 Essential (primary) hypertension: Secondary | ICD-10-CM | POA: Diagnosis not present

## 2019-11-15 DIAGNOSIS — F419 Anxiety disorder, unspecified: Secondary | ICD-10-CM | POA: Diagnosis not present

## 2019-11-15 DIAGNOSIS — R319 Hematuria, unspecified: Secondary | ICD-10-CM | POA: Diagnosis not present

## 2019-11-15 DIAGNOSIS — E538 Deficiency of other specified B group vitamins: Secondary | ICD-10-CM | POA: Diagnosis not present

## 2019-11-15 MED ORDER — FLUCONAZOLE 150 MG PO TABS
150.0000 mg | ORAL_TABLET | Freq: Once | ORAL | 0 refills | Status: DC
Start: 2019-11-15 — End: 2019-11-16

## 2019-11-15 NOTE — Telephone Encounter (Signed)
Diflucan sent to pharm

## 2019-11-15 NOTE — Telephone Encounter (Signed)
New message:   Pt states she would like to know if Dr. Quay Burow wants her to start back on something for the yeast infection. Pt states she was here this morning but forgot to ask about it. Please advise.

## 2019-11-15 NOTE — Assessment & Plan Note (Signed)
Chronic Getting B12 injections monthly Injection due today

## 2019-11-15 NOTE — Assessment & Plan Note (Signed)
Subacute She has had blood in her urine and continues to have blood in her urine At her last visit she was diagnosed with a UTI and I started her on Keflex, which she will be done with in a couple of days Still seeing some blood in the urine CT of the abdomen and pelvis 10/29/2019 showed that the kidneys and proximal ureters were normal. Bladder was not commented on We will repeat UA, urine culture, CBC, CMP today Depending on results and if her hematuria persists may need to consider additional imaging Referral has already been ordered for urology

## 2019-11-15 NOTE — Patient Instructions (Addendum)
  Blood work was ordered.      Medications reviewed and updated.  Changes include :   none

## 2019-11-15 NOTE — Assessment & Plan Note (Signed)
Chronic BP fairly controlled Current regimen effective and well tolerated Continue current medications at current doses

## 2019-11-15 NOTE — Assessment & Plan Note (Signed)
Chronic Somewhat controlled Still trying to taper off Paxil Has Xanax that she can take as needed Continue to see her therapist

## 2019-11-16 ENCOUNTER — Encounter: Payer: Self-pay | Admitting: Internal Medicine

## 2019-11-16 ENCOUNTER — Emergency Department (HOSPITAL_COMMUNITY)
Admission: EM | Admit: 2019-11-16 | Discharge: 2019-11-17 | Disposition: A | Discharge status: Home / Self Care | Payer: BC Managed Care – PPO | Attending: Emergency Medicine | Admitting: Emergency Medicine

## 2019-11-16 ENCOUNTER — Other Ambulatory Visit: Payer: Self-pay

## 2019-11-16 ENCOUNTER — Encounter (HOSPITAL_COMMUNITY): Payer: Self-pay

## 2019-11-16 DIAGNOSIS — F17210 Nicotine dependence, cigarettes, uncomplicated: Secondary | ICD-10-CM | POA: Insufficient documentation

## 2019-11-16 DIAGNOSIS — R3129 Other microscopic hematuria: Secondary | ICD-10-CM | POA: Diagnosis not present

## 2019-11-16 DIAGNOSIS — G44209 Tension-type headache, unspecified, not intractable: Secondary | ICD-10-CM | POA: Insufficient documentation

## 2019-11-16 DIAGNOSIS — J45909 Unspecified asthma, uncomplicated: Secondary | ICD-10-CM | POA: Insufficient documentation

## 2019-11-16 DIAGNOSIS — I10 Essential (primary) hypertension: Secondary | ICD-10-CM | POA: Insufficient documentation

## 2019-11-16 DIAGNOSIS — R110 Nausea: Secondary | ICD-10-CM | POA: Diagnosis not present

## 2019-11-16 DIAGNOSIS — N939 Abnormal uterine and vaginal bleeding, unspecified: Secondary | ICD-10-CM | POA: Insufficient documentation

## 2019-11-16 DIAGNOSIS — N390 Urinary tract infection, site not specified: Secondary | ICD-10-CM | POA: Diagnosis present

## 2019-11-16 DIAGNOSIS — Z79899 Other long term (current) drug therapy: Secondary | ICD-10-CM | POA: Insufficient documentation

## 2019-11-16 LAB — COMPREHENSIVE METABOLIC PANEL
ALT: 18 U/L (ref 0–44)
AST: 11 U/L — ABNORMAL LOW (ref 15–41)
Albumin: 3.6 g/dL (ref 3.5–5.0)
Alkaline Phosphatase: 75 U/L (ref 38–126)
Anion gap: 13 (ref 5–15)
BUN: 10 mg/dL (ref 6–20)
CO2: 22 mmol/L (ref 22–32)
Calcium: 9.3 mg/dL (ref 8.9–10.3)
Chloride: 105 mmol/L (ref 98–111)
Creatinine, Ser: 1.03 mg/dL — ABNORMAL HIGH (ref 0.44–1.00)
GFR calc Af Amer: >60 mL/min (ref >60)
GFR calc non Af Amer: >60 mL/min (ref >60)
Glucose, Bld: 96 mg/dL (ref 70–99)
Potassium: 3.8 mmol/L (ref 3.5–5.1)
Sodium: 140 mmol/L (ref 135–145)
Total Bilirubin: 0.4 mg/dL (ref 0.3–1.2)
Total Protein: 7.6 g/dL (ref 6.5–8.1)

## 2019-11-16 LAB — CBC WITH DIFFERENTIAL/PLATELET
Abs Immature Granulocytes: 0.05 10*3/uL (ref 0.00–0.07)
Absolute Monocytes: 449 cells/uL (ref 200–950)
Basophils Absolute: 43 cells/uL (ref 0–200)
Basophils Absolute: 0 10*3/uL (ref 0.0–0.1)
Basophils Relative: 0.4 %
Basophils Relative: 0 %
Eosinophils Absolute: 214 cells/uL (ref 15–500)
Eosinophils Absolute: 0.2 10*3/uL (ref 0.0–0.5)
Eosinophils Relative: 2 %
Eosinophils Relative: 1 %
HCT: 35.3 % (ref 35.0–45.0)
HCT: 38.6 % (ref 36.0–46.0)
Hemoglobin: 11.4 g/dL — ABNORMAL LOW (ref 11.7–15.5)
Hemoglobin: 11.9 g/dL — ABNORMAL LOW (ref 12.0–15.0)
Immature Granulocytes: 1 %
Lymphocytes Relative: 22 %
Lymphs Abs: 1980 cells/uL (ref 850–3900)
Lymphs Abs: 2.3 10*3/uL (ref 0.7–4.0)
MCH: 28.7 pg (ref 27.0–33.0)
MCH: 28.7 pg (ref 26.0–34.0)
MCHC: 32.3 g/dL (ref 32.0–36.0)
MCHC: 30.8 g/dL (ref 30.0–36.0)
MCV: 88.9 fL (ref 80.0–100.0)
MCV: 93.2 fL (ref 80.0–100.0)
MPV: 10.3 fL (ref 7.5–12.5)
Monocytes Absolute: 0.5 10*3/uL (ref 0.1–1.0)
Monocytes Relative: 4.2 %
Monocytes Relative: 4 %
Neutro Abs: 8014 cells/uL — ABNORMAL HIGH (ref 1500–7800)
Neutro Abs: 7.4 10*3/uL (ref 1.7–7.7)
Neutrophils Relative %: 74.9 %
Neutrophils Relative %: 72 %
Platelets: 407 10*3/uL — ABNORMAL HIGH (ref 140–400)
Platelets: 446 10*3/uL — ABNORMAL HIGH (ref 150–400)
RBC: 3.97 10*6/uL (ref 3.80–5.10)
RBC: 4.14 MIL/uL (ref 3.87–5.11)
RDW: 13.3 % (ref 11.0–15.0)
RDW: 13.9 % (ref 11.5–15.5)
Total Lymphocyte: 18.5 %
WBC: 10.7 10*3/uL (ref 3.8–10.8)
WBC: 10.4 10*3/uL (ref 4.0–10.5)
nRBC: 0 % (ref 0.0–0.2)

## 2019-11-16 LAB — COMPLETE METABOLIC PANEL WITH GFR
AG Ratio: 1.2 (calc) (ref 1.0–2.5)
ALT: 13 U/L (ref 6–29)
AST: 6 U/L — ABNORMAL LOW (ref 10–35)
Albumin: 4 g/dL (ref 3.6–5.1)
Alkaline phosphatase (APISO): 82 U/L (ref 31–125)
BUN: 13 mg/dL (ref 7–25)
CO2: 22 mmol/L (ref 20–32)
Calcium: 9.1 mg/dL (ref 8.6–10.2)
Chloride: 105 mmol/L (ref 98–110)
Creat: 0.94 mg/dL (ref 0.50–1.10)
GFR, Est African American: 84 mL/min/{1.73_m2} (ref >=60)
GFR, Est Non African American: 72 mL/min/{1.73_m2} (ref >=60)
Globulin: 3.4 g/dL (calc) (ref 1.9–3.7)
Glucose, Bld: 112 mg/dL — ABNORMAL HIGH (ref 65–99)
Potassium: 4.2 mmol/L (ref 3.5–5.3)
Sodium: 137 mmol/L (ref 135–146)
Total Bilirubin: 0.3 mg/dL (ref 0.2–1.2)
Total Protein: 7.4 g/dL (ref 6.1–8.1)

## 2019-11-16 LAB — I-STAT BETA HCG BLOOD, ED (MC, WL, AP ONLY): I-stat hCG, quantitative: <5 m[IU]/mL (ref <5)

## 2019-11-16 LAB — URINALYSIS, ROUTINE W REFLEX MICROSCOPIC
Bacteria, UA: NONE SEEN /HPF
Bacteria, UA: NONE SEEN
Bilirubin Urine: NEGATIVE
Bilirubin Urine: NEGATIVE
Glucose, UA: NEGATIVE
Glucose, UA: NEGATIVE mg/dL
Hyaline Cast: NONE SEEN /LPF
Ketones, ur: NEGATIVE
Ketones, ur: NEGATIVE mg/dL
Nitrite: NEGATIVE
Nitrite: NEGATIVE
Protein, ur: NEGATIVE
Protein, ur: NEGATIVE mg/dL
RBC / HPF: >50 RBC/hpf — ABNORMAL HIGH (ref 0–5)
Specific Gravity, Urine: 1.016 (ref 1.001–1.03)
Specific Gravity, Urine: 1.01 (ref 1.005–1.030)
pH: 5.5 (ref 5.0–8.0)
pH: 5 (ref 5.0–8.0)

## 2019-11-16 LAB — URINE CULTURE

## 2019-11-16 MED ORDER — METOCLOPRAMIDE HCL 5 MG/ML IJ SOLN: 10.0000 mg | Freq: Once | INTRAMUSCULAR | Status: DC

## 2019-11-16 MED ORDER — ACETAMINOPHEN 325 MG PO TABS
650.0000 mg | ORAL_TABLET | Freq: Once | ORAL | Status: AC
  Administered 2019-11-16: 650 mg via ORAL

## 2019-11-16 MED ORDER — DIPHENHYDRAMINE HCL 50 MG/ML IJ SOLN: 25.0000 mg | Freq: Once | INTRAMUSCULAR | Status: DC

## 2019-11-16 MED ORDER — SODIUM CHLORIDE 0.9 % IV BOLUS
500.0000 mL | Freq: Once | INTRAVENOUS | Status: AC
  Administered 2019-11-16: 500 mL via INTRAVENOUS

## 2019-11-16 MED ORDER — FLUCONAZOLE 150 MG PO TABS: 150.0000 mg | ORAL_TABLET | Freq: Once | ORAL | 0 refills | Status: AC

## 2019-11-16 MED ORDER — SODIUM CHLORIDE 0.9 % IV BOLUS: 1000.0000 mL | Freq: Once | INTRAVENOUS | Status: DC

## 2019-11-16 MED ORDER — ONDANSETRON 4 MG PO TBDP
4.0000 mg | ORAL_TABLET | Freq: Once | ORAL | Status: AC
  Administered 2019-11-16: 4 mg via ORAL

## 2019-11-16 NOTE — Discharge Instructions (Addendum)
You will need to follow-up with you your PCP, OB/GYN for your ongoing symptoms. You can also contact the urologist listed below regarding the blood in your urine. Continue your home medications as prescribed. We will call you with the results of your urine culture with available, if you need to be on an antibiotic. Return to the ER if you develop chest pain, shortness of breath, abdominal pain with fever, numbness in arms or legs or blurry vision.

## 2019-11-16 NOTE — ED Notes (Signed)
PA at bedside.

## 2019-11-16 NOTE — ED Triage Notes (Signed)
Pt presents with feeling "off" for several days, pt seen her PCP they sent her here for further evaluation. Pt very tearful in triage. Pt reports her vaginal bleeding started after receiving her vaccine injection. Normal menstrual cycle usually starts between 7-10th Pt taking an abx for recent UTI. PCP sent her here for further evaluation of abnormal blood work    1st vaccine Estate manager/land agent 7/10 2nd shot 7/31

## 2019-11-16 NOTE — ED Provider Notes (Signed)
[facility] EMERGENCY DEPARTMENT Provider Note   CSN: [id number] Arrival date & time: 11/16/19  1524     History Chief Complaint  Patient presents with   Recurrent UTI   Vaginal Bleeding    Velena Chestina Komatsu is a 49 y.o. female with a past medical history of PCOS, anxiety, hypertension, obesity, GERD presenting to the ED with multiple complaints.  All of her symptoms have been chronic since June 2021. #1 complains of hematuria.  Has had hematuria for the past 2 months.  She was diagnosed with a UTI and was placed on Keflex based on her urine culture.  She completed her entire course of Keflex.  She continues to have hematuria and is in the process of being referred to a urologist and will see them in September.  She reports some bladder pain associated with the hematuria.  Denies any fevers, other abdominal pain. #2 vaginal bleeding.  Has had vaginal bleeding since receiving her Covid vaccination 1 month ago.  She has been seeing her OB/GYN for this, had pelvic ultrasound last week which showed no structural cause of bleeding.  States that she believes the hematuria could be related to the vaginal bleeding but she is unsure.  Denies any pelvic pain, anticoagulant use, lightheadedness. #3 sent from the PCP for elevation in platelet count.  She was also sent by PCP to "make sure I do not have a residual UTI."  She saw her PCP yesterday.  Reports nausea, headache, decreased appetite and worsening anxiety for the past several days related to her above-mentioned symptoms.  Denies any blurry vision, neck stiffness, chest pain.    HPI     Past Medical History:  Diagnosis Date   Anxiety    ASTHMA    B12 deficiency    DELAYED GASTRIC EMPTYING    DEPRESSION    GERD    Glucose intolerance (impaired glucose tolerance) 04/09/2015   Hypertension    OBESITY    OBSTRUCTIVE SLEEP APNEA    POLYCYSTIC OVARIAN DISEASE    Prediabetes    SMOKER    SOB (shortness of  breath)    SVT (supraventricular tachycardia) (HCC)    Tachycardia    Umbilical hernia    Umbilical hernia    Vitamin B12 deficiency 03/2015 dx   start IM replacement q 30d   Vitamin D deficiency 03/2015 dx    Patient Active Problem List   Diagnosis Date Noted   Vaginal candidiasis 11/08/2019   Blood in urine 11/08/2019   GAD (generalized anxiety disorder) 06/06/2019   Other hyperlipidemia 03/13/2019   Adrenal adenoma, left 10/10/2018   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   Essential hypertension 11/01/2017   Chronic frontal sinusitis 10/30/2017   Submandibular gland mass 10/30/2017   Prediabetes 05/18/2017   SOB (shortness of breath) 09/24/2016   Palpitations 09/24/2016   Family history of early CAD 03/23/2016   SVT (supraventricular tachycardia) ([place]) 03/14/2016   Adrenal nodule ([place]) 73/22/0254   Supraumbilical hernia without gangrene and without obstruction 12/02/2015   Morbid obesity ([place]) 04/09/2015   Vitamin D deficiency    Vitamin B12 deficiency    Idiopathic intracranial hypertension 05/24/2013   Anxiety    GERD 01/28/2009   POLYCYSTIC OVARIAN DISEASE 02/10/2007   Class 3 severe obesity with serious comorbidity and body mass index (BMI) of 60.0 to 69.9 in adult ([place]) 02/10/2007   Obstructive sleep apnea 02/10/2007   Cough variant asthma 02/10/2007    Past Surgical History:  Procedure Laterality Date  CESAREAN SECTION  97 & 98   x's 2   Cocxyl removal  1995   DILATION AND CURETTAGE OF UTERUS     LAPAROSCOPY     TUBAL LIGATION  1998     OB History    Gravida  3   Para  2   Term  2   Preterm      AB  1   Living        SAB      TAB  1   Ectopic      Multiple      Live Births              Family History  Problem Relation Age of Onset   Breast cancer Mother        dx 11/2009   Diabetes Mother    High blood pressure Mother    Thyroid disease Mother    Anxiety disorder Mother    Obesity  Mother    Diabetes Maternal Aunt    Prostate cancer Maternal Grandfather 52       also hx colon polyps   Heart disease Paternal Grandmother    Pancreatic cancer Paternal Grandmother 15   High blood pressure Father    High Cholesterol Father    Depression Father    Heart murmur Sister    Adrenal disorder Neg Hx     Social History   Tobacco Use   Smoking status: Light Tobacco Smoker    Packs/day: 0.25    Years: 30.00    Pack years: 7.50    Types: Cigarettes    Start date: 10/23/1987    Last attempt to quit: 10/23/2017    Years since quitting: 2.0   Smokeless tobacco: Never Used  Vaping Use   Vaping Use: Never used  Substance Use Topics   Alcohol use: Yes    Alcohol/week: 0.0 standard drinks    Comment: Rarely   Drug use: No    Home Medications Prior to Admission medications   Medication Sig Start Date End Date Taking? Authorizing Provider  albuterol (VENTOLIN HFA) 108 (90 Base) MCG/ACT inhaler Inhale 2 puffs into the lungs every 6 (six) hours as needed for wheezing or shortness of breath.   Yes [provider]  ALPRAZolam (XANAX) 0.5 MG tablet Take 1 tablet (0.5 mg total) by mouth 3 (three) times daily as needed for anxiety. 11/08/19  Yes Burns, Claudina Lick, MD  cephALEXin (KEFLEX) 500 MG capsule Take 1 capsule (500 mg total) by mouth 2 (two) times daily. 11/10/19  Yes Burns, Claudina Lick, MD  Cyanocobalamin (B-12 COMPLIANCE INJECTION) 1000 MCG/ML KIT Inject 1,000 mcg as directed every 30 (thirty) days.   Yes [provider]  fluconazole (DIFLUCAN) 150 MG tablet Take 1 tablet (150 mg total) by mouth once for 1 dose. May repeat x 1 if needed after 72 hrs 11/16/19 11/16/19 Yes Burns, Claudina Lick, MD  loratadine (CLARITIN) 10 MG tablet Take 10 mg by mouth in the morning.   Yes [provider]  losartan (COZAAR) 25 MG tablet Take 1 tablet (25 mg total) by mouth daily. 10/17/19  Yes Whitmire, Dawn W, FNP  metFORMIN (GLUCOPHAGE) 500 MG tablet Take 1 tablet (500 mg  total) by mouth 2 (two) times daily with a meal. 10/17/19  Yes Whitmire, Dawn W, FNP  metoprolol tartrate (LOPRESSOR) 25 MG tablet Take 0.5 tablets (12.5 mg total) by mouth 2 (two) times daily. 11/02/18  Yes Herminio Commons, MD  pantoprazole ([place])  40 MG tablet TAKE 1 TABLET BY MOUTH EVERY DAY Patient taking differently: Take 40 mg by mouth daily before breakfast.  05/20/19  Yes Nandigam, Venia Minks, MD  PARoxetine (PAXIL) 10 MG tablet Take 1 tablet (10 mg total) by mouth daily. Patient taking differently: Take 5 mg by mouth at bedtime.  08/28/19  Yes Whitmire, Dawn W, FNP  PRESCRIPTION MEDICATION CPAP- At bedtime and during any time of rest   Yes [provider]  topiramate (TOPAMAX) 50 MG tablet Take 1 tablet (50 mg total) by mouth daily. Patient taking differently: Take 50 mg by mouth at bedtime.  10/17/19  Yes Whitmire, Dawn W, FNP  Vitamin D, Ergocalciferol, (DRISDOL) 1.25 MG (50000 UNIT) CAPS capsule TAKE 1 CAPSULE BY MOUTH ONE TIME PER WEEK Patient taking differently: Take 50,000 Units by mouth every Monday.  10/17/19  Yes Whitmire, Dawn W, FNP  Cholecalciferol 50 MCG (2000 UT) TABS Take by mouth. Patient not taking: Reported on 11/16/2019    [provider]  ipratropium (ATROVENT) 0.06 % nasal spray two sprays by Both Nostrils route 3 (three) times a day as needed for Rhinitis. Patient not taking: Reported on 11/16/2019 10/18/19   [provider]  Multiple Vitamin (MULTIVITAMIN) tablet Take 1 tablet by mouth daily.    [provider]  terconazole (TERAZOL 7) 0.4 % vaginal cream Place 1 applicator vaginally at bedtime. Patient not taking: Reported on 11/16/2019 11/06/19   Constant, Vickii Chafe, MD  UNABLE TO FIND Med Name: CPAP Patient not taking: Reported on 11/16/2019    [provider]  valACYclovir (VALTREX) 1000 MG tablet TAKE 2 TABLETS EVERY 12 HOURS FOR 1 DAY START ASAP AFTER SYTPTOM ONSET 06/14/19   Binnie Rail, MD    Allergies    Doxycycline and  Tape  Review of Systems   Review of Systems  Constitutional: Positive for activity change, appetite change and fatigue. Negative for chills and fever.  HENT: Negative for ear pain, rhinorrhea, sneezing and sore throat.   Eyes: Negative for photophobia and visual disturbance.  Respiratory: Negative for cough, chest tightness, shortness of breath and wheezing.   Cardiovascular: Negative for chest pain and palpitations.  Gastrointestinal: Positive for nausea. Negative for abdominal pain, blood in stool, constipation, diarrhea and vomiting.  Genitourinary: Positive for hematuria and vaginal bleeding. Negative for dysuria, pelvic pain, urgency and vaginal discharge.  Musculoskeletal: Negative for myalgias.  Skin: Negative for rash.  Neurological: Positive for headaches. Negative for dizziness, syncope, weakness and light-headedness.    Physical Exam Updated Vital Signs BP 137/65    Pulse 87    Temp 98.3 F (36.8 C) (Oral)    Resp 18    Ht 5' 5"  (1.651 m)    Wt (!) 166.9 kg    LMP 10/24/2019    SpO2 99%    BMI 61.24 kg/m   Physical Exam Vitals and nursing note reviewed.  Constitutional:      General: She is not in acute distress.    Appearance: She is well-developed.     Comments: Tearful, appears very anxious.  HENT:     Head: Normocephalic and atraumatic.     Nose: Nose normal.  Eyes:     General: No scleral icterus.       Right eye: No discharge.        Left eye: No discharge.     Conjunctiva/sclera: Conjunctivae normal.     Pupils: Pupils are equal, round, and reactive to light.  Cardiovascular:  Rate and Rhythm: Normal rate and regular rhythm.     Heart sounds: Normal heart sounds. No murmur heard.  No friction rub. No gallop.   Pulmonary:     Effort: Pulmonary effort is normal. No respiratory distress.     Breath sounds: Normal breath sounds.  Abdominal:     General: Bowel sounds are normal. There is no distension.     Palpations: Abdomen is soft.     Tenderness:  There is no abdominal tenderness. There is no guarding.  Musculoskeletal:        General: Normal range of motion.     Cervical back: Normal range of motion and neck supple.  Skin:    General: Skin is warm and dry.     Findings: No rash.  Neurological:     General: No focal deficit present.     Mental Status: She is alert and oriented to person, place, and time.     Cranial Nerves: No cranial nerve deficit.     Sensory: No sensory deficit.     Motor: No weakness or abnormal muscle tone.     Coordination: Coordination normal.     ED Results / Procedures / Treatments   Labs (all labs ordered are listed, but only abnormal results are displayed) Labs Reviewed  CBC WITH DIFFERENTIAL/PLATELET - Abnormal; Notable for the following components:      Result Value   Hemoglobin 11.9 (*)    Platelets 446 (*)    All other components within normal limits  COMPREHENSIVE METABOLIC PANEL - Abnormal; Notable for the following components:   Creatinine, Ser 1.03 (*)    AST 11 (*)    All other components within normal limits  URINALYSIS, ROUTINE W REFLEX MICROSCOPIC - Abnormal; Notable for the following components:   Hgb urine dipstick LARGE (*)    Leukocytes,Ua MODERATE (*)    RBC / HPF >50 (*)    All other components within normal limits  URINE CULTURE  I-STAT BETA HCG BLOOD, ED ([facility], [facility], [facility] ONLY)    EKG None  Radiology No results found.  Procedures Procedures (including critical care time)  Medications Ordered in ED Medications  sodium chloride 0.9 % bolus 500 mL (has no administration in time range)  acetaminophen (TYLENOL) tablet 650 mg (650 mg Oral Given 11/16/19 2035)  ondansetron (ZOFRAN-ODT) disintegrating tablet 4 mg (4 mg Oral Given 11/16/19 2104)    ED Course  I have reviewed the triage vital signs and the nursing notes.  Pertinent labs & imaging results that were available during my care of the patient were reviewed by me and considered in my medical decision making (see  chart for details).    MDM Rules/Calculators/A&P                          49 year old female presenting to the ED with multiple chronic complaints. 1.  Regarding her hematuria.  Reports hematuria for the past 2 months.  I am unsure if this is actually hematuria or vaginal bleeding.  She was diagnosed with a UTI and placed on Keflex based on her urine culture.  She completed this course of Keflex.  She is being referred to a urologist regarding the hematuria.  Reports some bladder pain but afebrile here without CVA tenderness.  Abdomen without any focal tenderness.  UA here with leukocytes but otherwise no bacteria, negative for nitrites or pyuria.  She does have RBCs noted.  At this time I do  not believe that we need to treat for a UTI as she was in recently treated and no evidence of pyelonephritis.  Will refer her to urology. 2.  Vaginal bleeding.  Has had this since her Covid vaccine 1 month ago.  She is seeing OB/GYN for this, no worsening of bleeding today, no pelvic pain.  Reviewed her pelvic ultrasound from last week which showed no abnormalities.  Do not feel any repeat imaging or further evaluation is needed at this time as her symptoms are chronic and being worked up by her OB/GYN. 3. Sent by PCP to make sure "I do not have a residual UTI."  Also was told by her PCP that her platelet count was abnormal at 407 and she needed to come to the ER for this.  Reviewed lab work today showing platelet count of 446, I feel this is an acute phase reactant from her symptoms.  Hemoglobin stable here, no transfusion required at this point.  Other lab work including CMP and i-STAT hCG are unremarkable.  She does mention a headache, decreased appetite and worsening her anxiety.  She has no neurological deficits noted.  No meningeal signs. There are no headache characteristics that are lateralizing or concerning for increased ICP, infectious or vascular cause of her symptoms. I had a long discussion with the  patient regarding her chronic symptoms, today's work-up and her concerns.  She is concerned that she has "sepsis" based on something that she read online.  I informed her that as she is afebrile here, no leukocytosis, no hypotension, no tachycardia that she does not meet any sepsis criteria.  There is no UTI that would concern me for a source of infection.  At this time her abdomen is not tender.  She is overall well-appearing.  I think a lot of this has to do with her anxiety. She states she even has anxiety medication at home but does not take it "because I don't like medicine." I explained to patient that her work-up here is overall reassuring.  I will send her urine for culture as she requested. At this tim I see no indication for admission or other work-up, do not feel she needs antibiotic therapy.  She was treated with Tylenol and Zofran as well as IV fluids.  I will have her follow-up with urology, OB/GYN and her PCP.  Patient is agreeable to the plan.  She remains hemodynamically stable here.  Feels better after treatment. Able to tolerate PO. Return precautions given.   Patient is hemodynamically stable, in NAD, and able to ambulate in the ED. Evaluation does not show pathology that would require ongoing emergent intervention or inpatient treatment. I explained the diagnosis to the patient. Pain has been managed and has no complaints prior to discharge. Patient is comfortable with above plan and is stable for discharge at this time. All questions were answered prior to disposition. Strict return precautions for returning to the ED were discussed. Encouraged follow up with PCP.   An After Visit Summary was printed and given to the patient.   Portions of this note were generated with [employer]. Dictation errors may occur despite best attempts at proofreading.  Final Clinical Impression(s) / ED Diagnoses Final diagnoses:  Other microscopic hematuria  Acute non intractable  tension-type headache    Rx / DC Orders ED Discharge Orders    None       Delia Heady, PA-C 11/16/19 2252    Wyvonnia Dusky, MD 11/17/19 [phone]

## 2019-11-18 ENCOUNTER — Telehealth: Payer: Self-pay

## 2019-11-18 MED ORDER — NITROFURANTOIN MONOHYD MACRO 100 MG PO CAPS: 100.0000 mg | ORAL_CAPSULE | Freq: Two times a day (BID) | ORAL | 0 refills | Status: DC

## 2019-11-18 NOTE — Telephone Encounter (Signed)
There is nothing acutely concerning on her labs from the ER on 8/7; she does have a UTI but it looks like she is being treated for this based on Dr. Quay Burow' last message.  If she is having abnormal vaginal bleeding, she needs to see her GYN though as Dr. Quay Burow will not manage this for her.

## 2019-11-18 NOTE — Telephone Encounter (Signed)
Per [facility] note on  11/16/2019 1:45:58 PM , Caller has UTI sxs and vaginal bleeding. She was seen on Friday. She has had new sxs. She just got some abnormal results back and is asking what to do. She doesn't feel well with headache, nausea, dry heaving, and no appetite. She is having urination frequency, blood in urine, and cloudiness. She is very anxious about her lab results and doesn't understand what the results mean and is in tears. She says she has pushed herself too hard. Mentions that she got her 2nd Covid shot last week and all her sxs started afterwards. Gets real worked up asking if she needs to go to ER.   Caller states her platelets are 407,000 and her HgB is 11.4. She has abnormal vaginal bleeding.  Advised to see PCP within 2 weeks. Patient currently has a follow up on 12/20/2019 for anxiety and hypertension.

## 2019-11-19 ENCOUNTER — Encounter (INDEPENDENT_AMBULATORY_CARE_PROVIDER_SITE_OTHER): Payer: Self-pay | Admitting: Family Medicine

## 2019-11-19 ENCOUNTER — Other Ambulatory Visit: Payer: Self-pay

## 2019-11-19 ENCOUNTER — Ambulatory Visit (INDEPENDENT_AMBULATORY_CARE_PROVIDER_SITE_OTHER): Payer: BC Managed Care – PPO | Admitting: Family Medicine

## 2019-11-19 VITALS — BP 145/86 | HR 81 | Temp 98.1°F | Ht 65.0 in | Wt 358.0 lb

## 2019-11-19 DIAGNOSIS — I10 Essential (primary) hypertension: Secondary | ICD-10-CM | POA: Diagnosis not present

## 2019-11-19 DIAGNOSIS — F411 Generalized anxiety disorder: Secondary | ICD-10-CM

## 2019-11-19 DIAGNOSIS — E559 Vitamin D deficiency, unspecified: Secondary | ICD-10-CM | POA: Diagnosis not present

## 2019-11-19 DIAGNOSIS — R002 Palpitations: Secondary | ICD-10-CM | POA: Diagnosis not present

## 2019-11-19 DIAGNOSIS — Z9189 Other specified personal risk factors, not elsewhere classified: Secondary | ICD-10-CM | POA: Diagnosis not present

## 2019-11-19 DIAGNOSIS — Z6841 Body Mass Index (BMI) 40.0 and over, adult: Secondary | ICD-10-CM

## 2019-11-19 LAB — URINE CULTURE: Culture: 80000 — AB

## 2019-11-19 MED ORDER — PAROXETINE HCL 10 MG PO TABS: 10.0000 mg | ORAL_TABLET | Freq: Every day | ORAL | 0 refills | Status: DC

## 2019-11-19 MED ORDER — CIPROFLOXACIN HCL 250 MG PO TABS
250.0000 mg | ORAL_TABLET | Freq: Two times a day (BID) | ORAL | 0 refills | Status: DC
Start: 2019-11-19 — End: 2019-11-26

## 2019-11-19 MED ORDER — VITAMIN D (ERGOCALCIFEROL) 1.25 MG (50000 UNIT) PO CAPS: 50000.0000 [IU] | ORAL_CAPSULE | ORAL | 0 refills | Status: DC

## 2019-11-19 MED ORDER — LOSARTAN POTASSIUM 25 MG PO TABS: 25.0000 mg | ORAL_TABLET | Freq: Every day | ORAL | 0 refills | Status: DC

## 2019-11-19 NOTE — Telephone Encounter (Signed)
Sensitivity of culture came back - we need to change antibiotics.  Stop the nitrofurantoin and start cipro - this one is effective.    Let her know her platelet count being slightly elevated is only because she has an infection.  Her two numbers that are marked as being low are not a concern.     cipro sent to pharmacy

## 2019-11-19 NOTE — Telephone Encounter (Signed)
Spoke with patient today and info given. 

## 2019-11-20 ENCOUNTER — Encounter (INDEPENDENT_AMBULATORY_CARE_PROVIDER_SITE_OTHER): Payer: Self-pay | Admitting: Family Medicine

## 2019-11-20 ENCOUNTER — Telehealth: Payer: Self-pay

## 2019-11-20 ENCOUNTER — Ambulatory Visit (INDEPENDENT_AMBULATORY_CARE_PROVIDER_SITE_OTHER): Payer: BC Managed Care – PPO | Admitting: Psychology

## 2019-11-20 DIAGNOSIS — F411 Generalized anxiety disorder: Secondary | ICD-10-CM | POA: Diagnosis not present

## 2019-11-20 NOTE — Telephone Encounter (Signed)
Post ED Visit - Positive Culture Follow-up  Culture report reviewed by antimicrobial stewardship pharmacist: [facility] []  [facility], Pharm.D. []  Heide Guile, Pharm.D., BCPS AQ-ID []  Parks Neptune, Pharm.D., BCPS []  Alycia Rossetti, Pharm.D., BCPS []  [place], Pharm.D., BCPS, AAHIVP []  Legrand Como, Pharm.D., BCPS, AAHIVP []  Salome Arnt, PharmD, BCPS []  Johnnette Gourd, PharmD, BCPS []  Hughes Better, PharmD, BCPS []  Leeroy Cha, PharmD []  Laqueta Linden, PharmD, BCPS []  Albertina Parr, PharmD [place] Team []  Leodis Sias, PharmD []  Lindell Spar, PharmD []  Royetta Asal, PharmD []  Graylin Shiver, Rph []  Rema Fendt) Glennon Mac, PharmD []  Arlyn Dunning, PharmD []  Netta Cedars, PharmD []  Dia Sitter, PharmD []  Leone Haven, PharmD []  Gretta Arab, PharmD []  Theodis Shove, PharmD []  Peggyann Juba, PharmD []  Reuel Boom, PharmD   Positive urine culture Treated with cipro, organism sensitive to the same and no further patient follow-up is required at this time.  Genia Del 11/20/2019, 9:44 AM

## 2019-11-20 NOTE — Progress Notes (Signed)
Chief Complaint:   OBESITY [name] is here to discuss her progress with her obesity treatment plan along with follow-up of her obesity related diagnoses. [name] is on the Category 3 Plan and states she is following her eating plan approximately 100% of the time. [name] states she is doing 0 minutes 0 times per week.  Today's visit was #: 70 Starting weight: 368 lbs Starting date: 02/14/2018 Today's weight: 358 lbs Today's date: 11/19/2019 Total lbs lost to date: 10 Total lbs lost since last in-office visit: 5  Interim History: [name] notes she is feeling bad overall. She notes palpitations (Has hx of palpitations and SVT) and increased shortness of breath with exertion, prolonged menstrual bleeding, recent urinary tract infection and vaginal yeast infection. She has had both COVID vaccines recently and has been quite anxious. She is wondering if these symptoms can be attributed to the vaccine since she was quite apprehensive about taking it. . She has no appetite, and she is not eating. Likely, her symptoms are due to worsened anxiety following the COVID vaccine.  Subjective:   1. GAD (generalized anxiety disorder) [name] has excessive anxiety due to recent COVID vaccines. She will see her counselor tomorrow. A friend mentioned to her the possibility of endocarditis from the COVID vaccine which has increased her anxiety.  2. Essential hypertension [name]'s blood pressure is slightly elevated today. She is quite anxious today. Cardiovascular ROS: no chest pain or dyspnea on exertion.  BP Readings from Last 3 Encounters:  11/19/19 (!) 145/86  11/17/19 (!) 144/54  11/15/19 140/74   Lab Results  Component Value Date   CREATININE 1.03 (H) 11/16/2019   CREATININE 0.94 11/15/2019   CREATININE 0.94 06/11/2019   3. Vitamin D deficiency [name]'s last Vit D level was slightly low. She is on weekly prescription Vit D.   4. Palpitations [name] notes increased palpitations and shortness of breath with  exertion. She has an appointment with Cardiology in 2 weeks. Her recent ECG in May 2021 was normal.  5. At risk for malnutrition [name] is at increased risk for malnutrition due to lack of appetite and decreased food intake.  Assessment/Plan:   1. GAD (generalized anxiety disorder) Behavior modification techniques were discussed today to help [name] deal with her anxiety. [name] will follow up with her counselor tomorrow. We will refill Paxil for 1 month. Orders and follow up as documented in patient record.   - PARoxetine (PAXIL) 10 MG tablet; Take 1 tablet (10 mg total) by mouth daily.  Dispense: 30 tablet; Refill: 0  2. Essential hypertension [name] is working on healthy weight loss and exercise to improve blood pressure control. We will watch for signs of hypotension as she continues her lifestyle modifications. We will refill losartan for 1 month.  - losartan (COZAAR) 25 MG tablet; Take 1 tablet (25 mg total) by mouth daily.  Dispense: 30 tablet; Refill: 0  3. Vitamin D deficiency Low Vitamin D level contributes to fatigue and are associated with obesity, breast, and colon cancer. We will refill prescription Vitamin D for 1 month. [name] will follow-up for routine testing of Vitamin D, at least 2-3 times per year to avoid over-replacement.  - Vitamin D, Ergocalciferol, (DRISDOL) 1.25 MG (50000 UNIT) CAPS capsule; Take 1 capsule (50,000 Units total) by mouth every Monday.  Dispense: 4 capsule; Refill: 0  4. Palpitations [name] will follow up with Cardiology, and she was advised to go to the emergency room for worsening shortness of breath, chest pain, or  palpitations.  5. At risk for malnutrition [name] was given approximately 15 minutes of counseling today regarding prevention of malnutrition and ways to meet macronutrient goals..   6. Class 3 severe obesity with serious comorbidity and body mass index (BMI) of 50.0 to 59.9 in adult, unspecified obesity type ([name]) [name] is currently in the action  stage of change. As such, her goal is to continue with weight loss efforts. She has agreed to the Category 3 Plan.   Exercise goals: No exercise has been prescribed at this time.  Behavioral modification strategies: increasing lean protein intake.  [name] has agreed to follow-up with our clinic in 3 weeks. She was informed of the importance of frequent follow-up visits to maximize her success with intensive lifestyle modifications for her multiple health conditions.   Objective:   Blood pressure (!) 145/86, pulse 81, temperature 98.1 F (36.7 C), temperature source Oral, height 5' 5"  (1.651 m), weight (!) 358 lb (162.4 kg), last menstrual period 10/24/2019, SpO2 97 %. Body mass index is 59.57 kg/m.  General: Cooperative, alert, well developed, in no acute distress. HEENT: Conjunctivae and lids unremarkable. Cardiovascular: Regular rhythm.  Lungs: Normal work of breathing. Neurologic: No focal deficits.   Lab Results  Component Value Date   CREATININE 1.03 (H) 11/16/2019   BUN 10 11/16/2019   NA 140 11/16/2019   K 3.8 11/16/2019   CL 105 11/16/2019   CO2 22 11/16/2019   Lab Results  Component Value Date   ALT 18 11/16/2019   AST 11 (L) 11/16/2019   ALKPHOS 75 11/16/2019   BILITOT 0.4 11/16/2019   Lab Results  Component Value Date   HGBA1C 6.0 06/11/2019   HGBA1C 5.8 (H) 03/12/2019   HGBA1C 5.8 (H) 09/28/2018   HGBA1C 5.7 (H) 02/14/2018   HGBA1C 6.1 11/24/2017   Lab Results  Component Value Date   INSULIN 38.0 (H) 07/19/2019   INSULIN 27.7 (H) 03/12/2019   INSULIN 2.0 (L) 09/28/2018   INSULIN 19.1 02/14/2018   Lab Results  Component Value Date   TSH 2.140 07/19/2019   Lab Results  Component Value Date   CHOL 177 06/11/2019   HDL 39.00 (L) 06/11/2019   LDLCALC 109 (H) 06/11/2019   LDLDIRECT 154.1 08/16/2012   TRIG 144.0 06/11/2019   CHOLHDL 5 06/11/2019   Lab Results  Component Value Date   WBC 10.4 11/16/2019   HGB 11.9 (L) 11/16/2019   HCT 38.6  11/16/2019   MCV 93.2 11/16/2019   PLT 446 (H) 11/16/2019   No results found for: IRON, TIBC, FERRITIN  Attestation Statements:   Reviewed by clinician on day of visit: allergies, medications, problem list, medical history, surgical history, family history, social history, and previous encounter notes.   Wilhemena Durie, am acting as Location manager for [employer], FNP-C.  I have reviewed the above documentation for accuracy and completeness, and I agree with the above. -  Georgianne Fick, FNP

## 2019-11-21 ENCOUNTER — Other Ambulatory Visit (HOSPITAL_COMMUNITY)
Admission: RE | Admit: 2019-11-21 | Discharge: 2019-11-21 | Disposition: A | Discharge status: Home / Self Care | Payer: BC Managed Care – PPO | Source: Ambulatory Visit | Attending: Obstetrics & Gynecology | Admitting: Obstetrics & Gynecology

## 2019-11-21 ENCOUNTER — Other Ambulatory Visit: Payer: Self-pay

## 2019-11-21 ENCOUNTER — Ambulatory Visit: Payer: BC Managed Care – PPO | Admitting: Obstetrics & Gynecology

## 2019-11-21 ENCOUNTER — Encounter: Payer: Self-pay | Admitting: Obstetrics & Gynecology

## 2019-11-21 VITALS — BP 152/90 | HR 73 | Wt 362.3 lb

## 2019-11-21 DIAGNOSIS — N939 Abnormal uterine and vaginal bleeding, unspecified: Secondary | ICD-10-CM | POA: Insufficient documentation

## 2019-11-21 DIAGNOSIS — N763 Subacute and chronic vulvitis: Secondary | ICD-10-CM | POA: Insufficient documentation

## 2019-11-21 DIAGNOSIS — R102 Pelvic and perineal pain: Secondary | ICD-10-CM | POA: Insufficient documentation

## 2019-11-21 NOTE — Progress Notes (Signed)
GYNECOLOGY OFFICE VISIT NOTE  History:   [name] is a 49 y.o. G3P2010 here today for continued vulvar irritation, at the inferior portion of her vulva right above her anus.  This area hurts during urination.  Still having AUB, still wearing one pad daily as noted during last visit last month. She denies any pelvic pain or other concerns.    Past Medical History:  Diagnosis Date  . Anxiety   . ASTHMA   . B12 deficiency   . DELAYED GASTRIC EMPTYING   . DEPRESSION   . GERD   . Glucose intolerance (impaired glucose tolerance) 04/09/2015  . Hypertension   . OBESITY   . OBSTRUCTIVE SLEEP APNEA   . POLYCYSTIC OVARIAN DISEASE   . Prediabetes   . SMOKER   . SOB (shortness of breath)   . SVT (supraventricular tachycardia) ([place])   . Tachycardia   . Umbilical hernia   . Umbilical hernia   . Vitamin B12 deficiency 03/2015 dx   start IM replacement q 30d  . Vitamin D deficiency 03/2015 dx    Past Surgical History:  Procedure Laterality Date  . CESAREAN SECTION  97 & 98   x's 2  . Cocxyl removal  1995  . DILATION AND CURETTAGE OF UTERUS    . LAPAROSCOPY    . TUBAL LIGATION  1998    The following portions of the patient's history were reviewed and updated as appropriate: allergies, current medications, past family history, past medical history, past social history, past surgical history and problem list.   Health Maintenance:  Normal pap and negative HRHPV on 11/04/2019.  Normal mammogram on 06/21/2019.   Review of Systems:  Pertinent items noted in HPI and remainder of comprehensive ROS otherwise negative.  Physical Exam:  BP (!) 152/90   Pulse 73   Wt (!) 362 lb 4.8 oz (164.3 kg)   LMP 11/21/2019 (Exact Date)   BMI 60.29 kg/m  CONSTITUTIONAL: Well-developed, well-nourished female in no acute distress.  MUSCULOSKELETAL: Normal range of motion. No edema noted. NEUROLOGIC: Alert and oriented to person, place, and time. Normal muscle tone coordination. No cranial  nerve deficit noted. PSYCHIATRIC: Normal mood and affect. Normal behavior. Normal judgment and thought content. CARDIOVASCULAR: Normal heart rate noted RESPIRATORY: Effort and breath sounds normal, no problems with respiration noted ABDOMEN: No masses noted. No other overt distention noted.   PELVIC: Normal appearing external genitalia; normal urethral meatus; normal appearing distal vaginal mucosa and cervix.  Blood noted on perineum and introitus, moderate amount.  On examination of perineum, some areas of superficial skin breakdown noted, ?excoriations, ?lacerations. Few areas of ?ulcers. HSV culture and cervicovaginal cultures obtained.  Performed in the presence of a chaperone  Labs and Imaging CBC Latest Ref Rng & Units 11/16/2019 11/15/2019 06/11/2019  WBC 4.0 - 10.5 K/uL 10.4 10.7 10.1  Hemoglobin 12.0 - 15.0 g/dL 11.9(L) 11.4(L) 12.0  Hematocrit 36 - 46 % 38.6 35.3 36.1  Platelets 150 - 400 K/uL 446(H) 407(H) 323.0    DG Chest 2 View  Result Date: 10/25/2019 CLINICAL DATA:  Cough, short of breath, chest pain EXAM: CHEST - 2 VIEW COMPARISON:  None. FINDINGS: Normal cardiac silhouette. Prominent epicardial fat pad at the LEFT apex. No effusion, infiltrate or pneumothorax. No acute osseous abnormality. IMPRESSION: Normal chest radiograph. Electronically Signed   By: Suzy Bouchard M.D.   On: 10/25/2019 12:45   US PELVIC COMPLETE WITH TRANSVAGINAL  Result Date: 11/11/2019 CLINICAL DATA:  Lower abdominal pain EXAM:  TRANSABDOMINAL AND TRANSVAGINAL ULTRASOUND OF PELVIS TECHNIQUE: Both transabdominal and transvaginal ultrasound examinations of the pelvis were performed. Transabdominal technique was performed for global imaging of the pelvis including uterus, ovaries, adnexal regions, and pelvic cul-de-sac. It was necessary to proceed with endovaginal exam following the transabdominal exam to visualize the endometrium. COMPARISON:  04/05/2018 FINDINGS: Uterus Measurements: 10.0 x 4.9 x 4.9 cm =  volume: 124 mL. No fibroids or other mass visualized. Endometrium Thickness: 12 mm in thickness.  No focal abnormality visualized. Right ovary Measurements: 2.7 x 2.6 x 2.7 cm = volume: 10 mL. Normal appearance/no adnexal mass. Left ovary Measurements: Not visualized.  No adnexal mass seen. Other findings No abnormal free fluid. IMPRESSION: No acute findings or significant abnormality. Electronically Signed   By: Rolm Baptise M.D.   On: 11/11/2019 12:21      Assessment and Plan:      1. Vulvar pain 2. Chronic vulvitis Could be irritation due to chronic pad use, bleeding, vaginitis. Will follow up labs and manage accordingly.  - Cervicovaginal ancillary only - Herpes simplex virus culture  3. Abnormal uterine bleeding (AUB) Still ongoing. Normal pelvic ultrasound. Endometrial biopsy recommended.  Routine preventative health maintenance measures emphasized. Please refer to After Visit Summary for other counseling recommendations.   Return in about 2 weeks (around 12/05/2019) for Endometrial biopsy.    Total face-to-face time with patient: 15 minutes.  Over 50% of encounter was spent on counseling and coordination of care.   Verita Schneiders, MD, [place] for [employer], [place]

## 2019-11-22 ENCOUNTER — Ambulatory Visit: Payer: BC Managed Care – PPO

## 2019-11-22 LAB — CERVICOVAGINAL ANCILLARY ONLY
Bacterial Vaginitis (gardnerella): NEGATIVE
Candida Glabrata: NEGATIVE
Candida Vaginitis: NEGATIVE
Chlamydia: NEGATIVE
Comment: NEGATIVE
Comment: NEGATIVE
Comment: NEGATIVE
Comment: NEGATIVE
Comment: NEGATIVE
Comment: NORMAL
Neisseria Gonorrhea: NEGATIVE
Trichomonas: NEGATIVE

## 2019-11-24 ENCOUNTER — Emergency Department (HOSPITAL_COMMUNITY)
Admission: EM | Admit: 2019-11-24 | Discharge: 2019-11-24 | Disposition: A | Discharge status: Home / Self Care | Payer: BC Managed Care – PPO | Attending: Emergency Medicine | Admitting: Emergency Medicine

## 2019-11-24 ENCOUNTER — Encounter (HOSPITAL_COMMUNITY): Payer: Self-pay

## 2019-11-24 ENCOUNTER — Other Ambulatory Visit: Payer: Self-pay

## 2019-11-24 DIAGNOSIS — R002 Palpitations: Secondary | ICD-10-CM | POA: Insufficient documentation

## 2019-11-24 DIAGNOSIS — I10 Essential (primary) hypertension: Secondary | ICD-10-CM | POA: Diagnosis not present

## 2019-11-24 DIAGNOSIS — R05 Cough: Secondary | ICD-10-CM | POA: Diagnosis not present

## 2019-11-24 DIAGNOSIS — R0789 Other chest pain: Secondary | ICD-10-CM | POA: Diagnosis not present

## 2019-11-24 DIAGNOSIS — R110 Nausea: Secondary | ICD-10-CM

## 2019-11-24 DIAGNOSIS — Z79899 Other long term (current) drug therapy: Secondary | ICD-10-CM | POA: Insufficient documentation

## 2019-11-24 DIAGNOSIS — R42 Dizziness and giddiness: Secondary | ICD-10-CM | POA: Diagnosis not present

## 2019-11-24 DIAGNOSIS — J45909 Unspecified asthma, uncomplicated: Secondary | ICD-10-CM | POA: Diagnosis not present

## 2019-11-24 DIAGNOSIS — R531 Weakness: Secondary | ICD-10-CM | POA: Diagnosis not present

## 2019-11-24 DIAGNOSIS — R6889 Other general symptoms and signs: Secondary | ICD-10-CM | POA: Diagnosis present

## 2019-11-24 DIAGNOSIS — F17210 Nicotine dependence, cigarettes, uncomplicated: Secondary | ICD-10-CM | POA: Insufficient documentation

## 2019-11-24 LAB — HERPES SIMPLEX VIRUS CULTURE

## 2019-11-24 LAB — CBC WITH DIFFERENTIAL/PLATELET
Abs Immature Granulocytes: 0.05 10*3/uL (ref 0.00–0.07)
Basophils Absolute: 0 10*3/uL (ref 0.0–0.1)
Basophils Relative: 0 %
Eosinophils Absolute: 0.1 10*3/uL (ref 0.0–0.5)
Eosinophils Relative: 1 %
HCT: 36.1 % (ref 36.0–46.0)
Hemoglobin: 11.5 g/dL — ABNORMAL LOW (ref 12.0–15.0)
Immature Granulocytes: 1 %
Lymphocytes Relative: 17 %
Lymphs Abs: 1.7 10*3/uL (ref 0.7–4.0)
MCH: 29.6 pg (ref 26.0–34.0)
MCHC: 31.9 g/dL (ref 30.0–36.0)
MCV: 93 fL (ref 80.0–100.0)
Monocytes Absolute: 0.5 10*3/uL (ref 0.1–1.0)
Monocytes Relative: 4 %
Neutro Abs: 7.9 10*3/uL — ABNORMAL HIGH (ref 1.7–7.7)
Neutrophils Relative %: 77 %
Platelets: 393 10*3/uL (ref 150–400)
RBC: 3.88 MIL/uL (ref 3.87–5.11)
RDW: 14 % (ref 11.5–15.5)
WBC: 10.3 10*3/uL (ref 4.0–10.5)
nRBC: 0 % (ref 0.0–0.2)

## 2019-11-24 LAB — BASIC METABOLIC PANEL
Anion gap: 9 (ref 5–15)
BUN: 12 mg/dL (ref 6–20)
CO2: 21 mmol/L — ABNORMAL LOW (ref 22–32)
Calcium: 8.3 mg/dL — ABNORMAL LOW (ref 8.9–10.3)
Chloride: 106 mmol/L (ref 98–111)
Creatinine, Ser: 0.96 mg/dL (ref 0.44–1.00)
GFR calc Af Amer: >60 mL/min (ref >60)
GFR calc non Af Amer: >60 mL/min (ref >60)
Glucose, Bld: 112 mg/dL — ABNORMAL HIGH (ref 70–99)
Potassium: 3.7 mmol/L (ref 3.5–5.1)
Sodium: 136 mmol/L (ref 135–145)

## 2019-11-24 LAB — TROPONIN I (HIGH SENSITIVITY)
Troponin I (High Sensitivity): 3 ng/L (ref <18)
Troponin I (High Sensitivity): 3 ng/L (ref <18)

## 2019-11-24 MED ORDER — ONDANSETRON HCL 4 MG/2ML IJ SOLN
4.0000 mg | Freq: Once | INTRAMUSCULAR | Status: AC
  Administered 2019-11-24: 4 mg via INTRAVENOUS
  Filled 2019-11-24: qty 2

## 2019-11-24 MED ORDER — ONDANSETRON 8 MG PO TBDP
ORAL_TABLET | ORAL | 0 refills | Status: DC
Start: 2019-11-24 — End: 2019-11-27

## 2019-11-24 MED ORDER — SODIUM CHLORIDE 0.9 % IV BOLUS
1000.0000 mL | Freq: Once | INTRAVENOUS | Status: AC
  Administered 2019-11-24: 1000 mL via INTRAVENOUS

## 2019-11-24 NOTE — ED Triage Notes (Signed)
Pt reports nausea, lightheadedness, chest pressure, heart racing, and weakness. Pt also reports currently having a UTI and is on prescribed anbx-cipro. Pt says heart racing has stopped, but pressure is still there.

## 2019-11-24 NOTE — Discharge Instructions (Addendum)
Use zofran as prescribed as needed for nausea.   Follow-up with your primary doctor if symptoms or not improving in the next few days, and return to the ER if symptoms significantly worsen or change.

## 2019-11-24 NOTE — ED Notes (Signed)
Pt informed that UA is needed.

## 2019-11-24 NOTE — ED Provider Notes (Signed)
[facility] EMERGENCY DEPARTMENT Provider Note   CSN: [id number] Arrival date & time: 11/24/19  0540     History Chief Complaint  Patient presents with  . multiple complaints    [name] is a 49 y.o. female.  Patient is a 49 year old female with past medical history of SVT, prediabetes, asthma, anxiety, and obesity.  She presents today for evaluation of multiple complaints.  Patient reports nausea, feeling lightheaded, tightness in her chest, heart palpitations, and weakness.  This is been ongoing for the past several days.  She also has a history of microscopic hematuria for which she is being worked up by urology.  She is currently taking Cipro for a UTI.  Patient denies to me she is having any fevers or chills.  She does have some cough.  She denies contacts with known Covid positive individuals and reports that she had her second dose of her Covid vaccine 2 weeks ago.  The history is provided by the patient.       Past Medical History:  Diagnosis Date  . Anxiety   . ASTHMA   . B12 deficiency   . DELAYED GASTRIC EMPTYING   . DEPRESSION   . GERD   . Glucose intolerance (impaired glucose tolerance) 04/09/2015  . Hypertension   . OBESITY   . OBSTRUCTIVE SLEEP APNEA   . POLYCYSTIC OVARIAN DISEASE   . Prediabetes   . SMOKER   . SOB (shortness of breath)   . SVT (supraventricular tachycardia) ([place])   . Tachycardia   . Umbilical hernia   . Umbilical hernia   . Vitamin B12 deficiency 03/2015 dx   start IM replacement q 30d  . Vitamin D deficiency 03/2015 dx    Patient Active Problem List   Diagnosis Date Noted  . Vaginal candidiasis 11/08/2019  . Blood in urine 11/08/2019  . GAD (generalized anxiety disorder) 06/06/2019  . Other hyperlipidemia 03/13/2019  . Adrenal adenoma, left 10/10/2018  . 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 Essential hypertension 11/01/2017  . Chronic frontal sinusitis 10/30/2017  . Submandibular gland mass 10/30/2017  .  Prediabetes 05/18/2017  . SOB (shortness of breath) 09/24/2016  . Palpitations 09/24/2016  . Family history of early CAD 03/23/2016  . SVT (supraventricular tachycardia) ([place]) 03/14/2016  . Adrenal nodule ([place]) 01/06/2016  . Supraumbilical hernia without gangrene and without obstruction 12/02/2015  . Morbid obesity ([place]) 04/09/2015  . Vitamin D deficiency   . Vitamin B12 deficiency   . Idiopathic intracranial hypertension 05/24/2013  . Anxiety   . GERD 01/28/2009  . POLYCYSTIC OVARIAN DISEASE 02/10/2007  . Class 3 severe obesity with serious comorbidity and body mass index (BMI) of 50.0 to 59.9 in adult ([place]) 02/10/2007  . Obstructive sleep apnea 02/10/2007  . Cough variant asthma 02/10/2007    Past Surgical History:  Procedure Laterality Date  . CESAREAN SECTION  97 & 98   x's 2  . Cocxyl removal  1995  . DILATION AND CURETTAGE OF UTERUS    . LAPAROSCOPY    . TUBAL LIGATION  1998     OB History    Gravida  3   Para  2   Term  2   Preterm      AB  1   Living        SAB      TAB  1   Ectopic      Multiple      Live Births  Family History  Problem Relation Age of Onset  . Breast cancer Mother        dx 11/2009  . Diabetes Mother   . High blood pressure Mother   . Thyroid disease Mother   . Anxiety disorder Mother   . Obesity Mother   . Diabetes Maternal Aunt   . Prostate cancer Maternal Grandfather 72       also hx colon polyps  . Heart disease Paternal Grandmother   . Pancreatic cancer Paternal Grandmother 8  . High blood pressure Father   . High Cholesterol Father   . Depression Father   . Heart murmur Sister   . Adrenal disorder Neg Hx     Social History   Tobacco Use  . Smoking status: Light Tobacco Smoker    Packs/day: 0.25    Years: 30.00    Pack years: 7.50    Types: Cigarettes    Start date: 10/23/1987    Last attempt to quit: 10/23/2017    Years since quitting: 2.0  . Smokeless tobacco: Never Used  Vaping Use    . Vaping Use: Never used  Substance Use Topics  . Alcohol use: Yes    Alcohol/week: 0.0 standard drinks    Comment: Rarely  . Drug use: No    Home Medications Prior to Admission medications   Medication Sig Start Date End Date Taking? Authorizing Provider  albuterol (VENTOLIN HFA) 108 (90 Base) MCG/ACT inhaler Inhale 2 puffs into the lungs every 6 (six) hours as needed for wheezing or shortness of breath.    [provider]  ALPRAZolam Duanne Moron) 0.5 MG tablet Take 1 tablet (0.5 mg total) by mouth 3 (three) times daily as needed for anxiety. 11/08/19   Binnie Rail, MD  ciprofloxacin (CIPRO) 250 MG tablet Take 1 tablet (250 mg total) by mouth 2 (two) times daily. 11/19/19   Binnie Rail, MD  Cyanocobalamin (B-12 COMPLIANCE INJECTION) 1000 MCG/ML KIT Inject 1,000 mcg as directed every 30 (thirty) days.    [provider]  loratadine (CLARITIN) 10 MG tablet Take 10 mg by mouth in the morning.    [provider]  losartan (COZAAR) 25 MG tablet Take 1 tablet (25 mg total) by mouth daily. 11/19/19   Whitmire, Joneen Boers, FNP  metFORMIN (GLUCOPHAGE) 500 MG tablet Take 1 tablet (500 mg total) by mouth 2 (two) times daily with a meal. 10/17/19   [place]ire, Joneen Boers, FNP  metoprolol tartrate (LOPRESSOR) 25 MG tablet Take 0.5 tablets (12.5 mg total) by mouth 2 (two) times daily. 11/02/18   Herminio Commons, MD  Multiple Vitamin (MULTIVITAMIN) tablet Take 1 tablet by mouth daily.    [provider]  nitrofurantoin, macrocrystal-monohydrate, (MACROBID) 100 MG capsule Take 1 capsule (100 mg total) by mouth 2 (two) times daily. 11/18/19   Binnie Rail, MD  pantoprazole (PROTONIX) 40 MG tablet TAKE 1 TABLET BY MOUTH EVERY DAY Patient taking differently: Take 40 mg by mouth daily before breakfast.  05/20/19   Nandigam, Venia Minks, MD  PARoxetine (PAXIL) 10 MG tablet Take 1 tablet (10 mg total) by mouth daily. 11/19/19   Whitmire, Joneen Boers, FNP  PRESCRIPTION MEDICATION CPAP- At bedtime  and during any time of rest    [provider]  topiramate (TOPAMAX) 50 MG tablet Take 1 tablet (50 mg total) by mouth daily. Patient taking differently: Take 50 mg by mouth at bedtime.  10/17/19   Whitmire, Joneen Boers, FNP  UNABLE TO FIND Med  Name: CPAP     [provider]  valACYclovir (VALTREX) 1000 MG tablet TAKE 2 TABLETS EVERY 12 HOURS FOR 1 DAY START ASAP AFTER SYTPTOM ONSET Patient taking differently: Take 2,000 mg by mouth See admin instructions. Take 2,000 mg by mouth every 12 hours for 1 day, starting ASAP, as directed, after onset of symptom(s) 06/14/19   Burns, Claudina Lick, MD  Vitamin D, Ergocalciferol, (DRISDOL) 1.25 MG (50000 UNIT) CAPS capsule Take 1 capsule (50,000 Units total) by mouth every Monday. 11/25/19   Whitmire, Joneen Boers, FNP    Allergies    Doxycycline and Tape  Review of Systems   Review of Systems  All other systems reviewed and are negative.   Physical Exam Updated Vital Signs BP 119/81   Pulse 72   Temp 98 F (36.7 C) (Oral)   Resp 12   Ht 5' 5"  (1.651 m)   Wt (!) 167.8 kg   LMP 11/21/2019 (Exact Date)   SpO2 98%   BMI 61.57 kg/m   Physical Exam Vitals and nursing note reviewed.  Constitutional:      General: She is not in acute distress.    Appearance: She is well-developed. She is not diaphoretic.  HENT:     Head: Normocephalic and atraumatic.     Mouth/Throat:     Mouth: Mucous membranes are moist.  Cardiovascular:     Rate and Rhythm: Normal rate and regular rhythm.     Heart sounds: No murmur heard.  No friction rub. No gallop.   Pulmonary:     Effort: Pulmonary effort is normal. No respiratory distress.     Breath sounds: Normal breath sounds. No wheezing.  Abdominal:     General: Bowel sounds are normal. There is no distension.     Palpations: Abdomen is soft.     Tenderness: There is no abdominal tenderness.  Musculoskeletal:        General: Normal range of motion.     Cervical back: Normal range of motion and neck  supple.  Skin:    General: Skin is warm and dry.  Neurological:     Mental Status: She is alert and oriented to person, place, and time.     ED Results / Procedures / Treatments   Labs (all labs ordered are listed, but only abnormal results are displayed) Labs Reviewed  BASIC METABOLIC PANEL  CBC WITH DIFFERENTIAL/PLATELET  URINALYSIS, ROUTINE W REFLEX MICROSCOPIC  TROPONIN I (HIGH SENSITIVITY)    EKG EKG Interpretation  Date/Time:  Sunday November 24 2019 06:27:20 EDT Ventricular Rate:  74 PR Interval:  166 QRS Duration: 90 QT Interval:  398 QTC Calculation: 441 R Axis:   27 Text Interpretation: Normal sinus rhythm Normal ECG Confirmed by Veryl Speak [phone]) on 11/24/2019 7:04:03 AM   Radiology No results found.  Procedures Procedures (including critical care time)  Medications Ordered in ED Medications  sodium chloride 0.9 % bolus 1,000 mL (has no administration in time range)  ondansetron (ZOFRAN) injection 4 mg (has no administration in time range)    ED Course  I have reviewed the triage vital signs and the nursing notes.  Pertinent labs & imaging results that were available during my care of the patient were reviewed by me and considered in my medical decision making (see chart for details).    MDM Rules/Calculators/A&P  Patient is a 49 year old female presenting with multiple complaints as outlined in the HPI. Nothing appears emergent and her vital signs are stable. Laboratory studies are  unremarkable.  At this point, I see nothing emergent.  Patient to be discharged with zofran.  Nausea likely related to the antibiotic she is taking.  Offered Covid testing, but declined.  Final Clinical Impression(s) / ED Diagnoses Final diagnoses:  None    Rx / DC Orders ED Discharge Orders    None       Veryl Speak, MD 11/24/19 1044

## 2019-11-25 ENCOUNTER — Other Ambulatory Visit (INDEPENDENT_AMBULATORY_CARE_PROVIDER_SITE_OTHER): Payer: Self-pay | Admitting: Family Medicine

## 2019-11-26 NOTE — Progress Notes (Signed)
Subjective:    Patient ID: [name], female    DOB: 23-Jun-1970, 49 y.o.   MRN: [id number]  HPI The patient is here for follow up from the hospital  Her nausea started on 8/7.  This was before she started the macrobid or cipro.    ED 8/7 for recurrent UTI, vaginal bleeding.  Blood work was unremarkable.  UA, UCx showed pseudomonas aeruginosa infection.  I initially started her on macrobid but changed this to cipro after sensitivity returned.    Gyn 8/12 for vulvar pain.  Dx with chronic vulvitis - possibly irritation from chronic pad use, bleeding or vaginitis.    8/15 ED for multiple complaints - nausea, lightheadedness, tightness in chest, palpitations, weakness.   Trop x 2, cbc, bmp unremarkable.  EKG normal.    Nausea - she is vomiting here in the office after having a  coughing spell.     She coughs then gags ands vomits.  She has been vomiting about 2/day since 8/7.   She has a taste in her mouth - rotten taste.  She has severe nausea.    She is drinking some fluids but gags.  She is not eating much.  She has not taken the zofran at home - she is fearful of medications and having side effects.    She does not feel well.  She has a burning or pressure like feeling in her chest  - feels like bronchitis. She has a  Intermittent dry cough, palpitations, headaches and lightheadedness.     Medications and allergies reviewed with patient and updated if appropriate.  Patient Active Problem List   Diagnosis Date Noted  . Vaginal candidiasis 11/08/2019  . Blood in urine 11/08/2019  . GAD (generalized anxiety disorder) 06/06/2019  . Other hyperlipidemia 03/13/2019  . Adrenal adenoma, left 10/10/2018  . 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 Essential hypertension 11/01/2017  . Chronic frontal sinusitis 10/30/2017  . Submandibular gland mass 10/30/2017  . Prediabetes 05/18/2017  . SOB (shortness of breath) 09/24/2016  . Palpitations 09/24/2016  . Family history of early  CAD 03/23/2016  . SVT (supraventricular tachycardia) ([place]) 03/14/2016  . Adrenal nodule ([place]) 01/06/2016  . Supraumbilical hernia without gangrene and without obstruction 12/02/2015  . Morbid obesity ([place]) 04/09/2015  . Vitamin D deficiency   . Vitamin B12 deficiency   . Idiopathic intracranial hypertension 05/24/2013  . Anxiety   . GERD 01/28/2009  . POLYCYSTIC OVARIAN DISEASE 02/10/2007  . Class 3 severe obesity with serious comorbidity and body mass index (BMI) of 50.0 to 59.9 in adult ([place]) 02/10/2007  . Obstructive sleep apnea 02/10/2007  . Cough variant asthma 02/10/2007    Current Outpatient Medications on File Prior to Visit  Medication Sig Dispense Refill  . albuterol (VENTOLIN HFA) 108 (90 Base) MCG/ACT inhaler Inhale 2 puffs into the lungs every 6 (six) hours as needed for wheezing or shortness of breath.    . ALPRAZolam (XANAX) 0.5 MG tablet Take 1 tablet (0.5 mg total) by mouth 3 (three) times daily as needed for anxiety. 60 tablet 0  . loratadine (CLARITIN) 10 MG tablet Take 10 mg by mouth in the morning.    [name] losartan (COZAAR) 25 MG tablet Take 1 tablet (25 mg total) by mouth daily. 30 tablet 0  . metFORMIN (GLUCOPHAGE) 500 MG tablet Take 1 tablet (500 mg total) by mouth 2 (two) times daily with a meal. 60 tablet 0  . metoprolol tartrate (LOPRESSOR) 25 MG tablet Take  0.5 tablets (12.5 mg total) by mouth 2 (two) times daily. 90 tablet 3  . pantoprazole (PROTONIX) 40 MG tablet TAKE 1 TABLET BY MOUTH EVERY DAY (Patient taking differently: Take 40 mg by mouth daily before breakfast. ) 90 tablet 3  . PARoxetine (PAXIL) 10 MG tablet Take 1 tablet (10 mg total) by mouth daily. (Patient taking differently: Take 5 mg by mouth at bedtime. ) 30 tablet 0  . PRESCRIPTION MEDICATION CPAP- At bedtime and during any time of rest    . topiramate (TOPAMAX) 50 MG tablet Take 1 tablet (50 mg total) by mouth daily. (Patient taking differently: Take 50 mg by mouth at bedtime. ) 30 tablet 0  .  valACYclovir (VALTREX) 1000 MG tablet TAKE 2 TABLETS EVERY 12 HOURS FOR 1 DAY START ASAP AFTER SYTPTOM ONSET (Patient taking differently: Take 2,000 mg by mouth See admin instructions. Take 2,000 mg by mouth every 12 hours for 1 day, starting ASAP, as directed, after onset of symptom(s)) 12 tablet 3  . Vitamin D, Ergocalciferol, (DRISDOL) 1.25 MG (50000 UNIT) CAPS capsule Take 1 capsule (50,000 Units total) by mouth every Monday. 4 capsule 0  . ondansetron (ZOFRAN ODT) 8 MG disintegrating tablet 74m ODT q4 hours prn nausea (Patient not taking: Reported on 11/27/2019) 6 tablet 0   Current Facility-Administered Medications on File Prior to Visit  Medication Dose Route Frequency Provider Last Rate Last Admin  . cyanocobalamin ((VITAMIN B-12)) injection 1,000 mcg  1,000 mcg Intramuscular Q30 days BBinnie Rail MD   1,000 mcg at 10/18/19 05284   Past Medical History:  Diagnosis Date  . Anxiety   . ASTHMA   . B12 deficiency   . DELAYED GASTRIC EMPTYING   . DEPRESSION   . GERD   . Glucose intolerance (impaired glucose tolerance) 04/09/2015  . Hypertension   . OBESITY   . OBSTRUCTIVE SLEEP APNEA   . POLYCYSTIC OVARIAN DISEASE   . Prediabetes   . SMOKER   . SOB (shortness of breath)   . SVT [place]ricular tachycardia) [place]   . Tachycardia   . Umbilical hernia   . Umbilical hernia   . Vitamin B12 deficiency 03/2015 dx   start IM replacement q 30d  . Vitamin D deficiency 03/2015 dx    Past Surgical History:  Procedure Laterality Date  . CESAREAN SECTION  97 & 98   x's 2  . Cocxyl removal  1995  . DILATION AND CURETTAGE OF UTERUS    . LAPAROSCOPY    . TUBAL LIGATION  1998    Social History   Socioeconomic History  . Marital status: Married    Spouse name: RSenai Ramnath . Number of children: 2  . Years of education: Not on file  . Highest education level: Not on file  Occupational History  . Occupation: CForensic psychologist  [employer]    Comment: CNA  Tobacco  Use  . Smoking status: Light Tobacco Smoker    Packs/day: 0.25    Years: 30.00    Pack years: 7.50    Types: Cigarettes    Start date: 10/23/1987    Last attempt to quit: 10/23/2017    Years since quitting: 2.0  . Smokeless tobacco: Never Used  Vaping Use  . Vaping Use: Never used  Substance and Sexual Activity  . Alcohol use: Yes    Alcohol/week: 0.0 standard drinks    Comment: Rarely  . Drug use: No  . Sexual activity: Not on  file  Other Topics Concern  . Not on file  Social History Narrative   Lives with husband & 2 kids from 1st marriage. (1st husband expired 1998-leukemia) work as Merchandiser, retail, but currently unemployed   Social Determinants of Radio broadcast assistant Strain:   . Difficulty of Paying Living Expenses:   Food Insecurity:   . Worried About Charity fundraiser in the Last Year:   . Arboriculturist in the Last Year:   Transportation Needs:   . Film/video editor (Medical):   [name] Lack of Transportation (Non-Medical):   Physical Activity:   . Days of Exercise per Week:   . Minutes of Exercise per Session:   Stress:   . Feeling of Stress :   Social Connections:   . Frequency of Communication with Friends and Family:   . Frequency of Social Gatherings with Friends and Family:   . Attends Religious Services:   . Active Member of Clubs or Organizations:   . Attends Archivist Meetings:   [name] Marital Status:     Family History  Problem Relation Age of Onset  . Breast cancer Mother        dx 11/2009  . Diabetes Mother   . High blood pressure Mother   . Thyroid disease Mother   . Anxiety disorder Mother   . Obesity Mother   . Diabetes Maternal Aunt   . Prostate cancer Maternal Grandfather 25       also hx colon polyps  . Heart disease Paternal Grandmother   . Pancreatic cancer Paternal Grandmother 5  . High blood pressure Father   . High Cholesterol Father   . Depression Father   . Heart murmur Sister   . Adrenal disorder  Neg Hx     Review of Systems  Constitutional: Positive for appetite change. Negative for fever (low grade).  HENT: Negative for congestion, postnasal drip and sore throat.   Respiratory: Positive for cough and shortness of breath (more than usual). Negative for wheezing.   Cardiovascular: Positive for chest pain (burning/pressure in chest - like when she had bronchitis) and palpitations.  Gastrointestinal: Positive for abdominal pain (LLQ - intermittent), nausea and vomiting. Negative for blood in stool, constipation and diarrhea.       No gerd  Genitourinary: Negative for dysuria.  Neurological: Positive for light-headedness and headaches.       Objective:   Vitals:   11/27/19 1018  BP: (!) 144/84  Pulse: 82  Temp: 98.4 F (36.9 C)  SpO2: 97%   BP Readings from Last 3 Encounters:  11/27/19 (!) 144/84  11/24/19 (!) 115/59  11/21/19 (!) 152/90   Wt Readings from Last 3 Encounters:  11/27/19 (!) 349 lb (158.3 kg)  11/24/19 (!) 370 lb (167.8 kg)  11/21/19 (!) 362 lb 4.8 oz (164.3 kg)   Body mass index is 58.08 kg/m.   Physical Exam    Constitutional: Appears well-developed and well-nourished. No distress.  HENT:  Head: Normocephalic and atraumatic.  Neck: Neck supple. No tracheal deviation present. No thyromegaly present.  No cervical lymphadenopathy Cardiovascular: Normal rate, regular rhythm and normal heart sounds.   No murmur heard. No carotid bruit .  No edema Pulmonary/Chest: Effort normal and breath sounds normal. No respiratory distress. No has no wheezes. No rales.  Skin: Skin is warm and dry. Not diaphoretic.  Psychiatric: Normal mood and affect. Behavior is normal.      Assessment & Plan:  See Problem List for Assessment and Plan of chronic medical problems.    This visit occurred during the SARS-CoV-2 public health emergency.  Safety protocols were in place, including screening questions prior to the visit, additional usage of staff PPE, and  extensive cleaning of exam room while observing appropriate contact time as indicated for disinfecting solutions.

## 2019-11-27 ENCOUNTER — Encounter: Payer: Self-pay | Admitting: Internal Medicine

## 2019-11-27 ENCOUNTER — Other Ambulatory Visit: Payer: Self-pay

## 2019-11-27 ENCOUNTER — Ambulatory Visit (INDEPENDENT_AMBULATORY_CARE_PROVIDER_SITE_OTHER): Payer: BC Managed Care – PPO

## 2019-11-27 ENCOUNTER — Ambulatory Visit (INDEPENDENT_AMBULATORY_CARE_PROVIDER_SITE_OTHER): Payer: BC Managed Care – PPO | Admitting: Psychology

## 2019-11-27 ENCOUNTER — Ambulatory Visit (INDEPENDENT_AMBULATORY_CARE_PROVIDER_SITE_OTHER): Payer: BC Managed Care – PPO | Admitting: Internal Medicine

## 2019-11-27 VITALS — BP 144/84 | HR 82 | Temp 98.4°F | Ht 65.0 in | Wt 349.0 lb

## 2019-11-27 DIAGNOSIS — R112 Nausea with vomiting, unspecified: Secondary | ICD-10-CM

## 2019-11-27 DIAGNOSIS — R05 Cough: Secondary | ICD-10-CM | POA: Diagnosis not present

## 2019-11-27 DIAGNOSIS — F411 Generalized anxiety disorder: Secondary | ICD-10-CM | POA: Diagnosis not present

## 2019-11-27 DIAGNOSIS — R059 Cough, unspecified: Secondary | ICD-10-CM | POA: Insufficient documentation

## 2019-11-27 DIAGNOSIS — R1110 Vomiting, unspecified: Secondary | ICD-10-CM | POA: Insufficient documentation

## 2019-11-27 LAB — CBC WITH DIFFERENTIAL/PLATELET
Absolute Monocytes: 566 cells/uL (ref 200–950)
Basophils Absolute: 62 cells/uL (ref 0–200)
Basophils Relative: 0.5 %
Eosinophils Absolute: 25 cells/uL (ref 15–500)
Eosinophils Relative: 0.2 %
HCT: 38.9 % (ref 35.0–45.0)
Hemoglobin: 12.6 g/dL (ref 11.7–15.5)
Lymphs Abs: 2165 cells/uL (ref 850–3900)
MCH: 28.9 pg (ref 27.0–33.0)
MCHC: 32.4 g/dL (ref 32.0–36.0)
MCV: 89.2 fL (ref 80.0–100.0)
MPV: 10.8 fL (ref 7.5–12.5)
Monocytes Relative: 4.6 %
Neutro Abs: 9483 cells/uL — ABNORMAL HIGH (ref 1500–7800)
Neutrophils Relative %: 77.1 %
Platelets: 478 10*3/uL — ABNORMAL HIGH (ref 140–400)
RBC: 4.36 10*6/uL (ref 3.80–5.10)
RDW: 13.2 % (ref 11.0–15.0)
Total Lymphocyte: 17.6 %
WBC: 12.3 10*3/uL — ABNORMAL HIGH (ref 3.8–10.8)

## 2019-11-27 LAB — COMPLETE METABOLIC PANEL WITH GFR
AG Ratio: 1.5 (calc) (ref 1.0–2.5)
ALT: 20 U/L (ref 6–29)
AST: 8 U/L — ABNORMAL LOW (ref 10–35)
Albumin: 4.7 g/dL (ref 3.6–5.1)
Alkaline phosphatase (APISO): 79 U/L (ref 31–125)
BUN: 11 mg/dL (ref 7–25)
CO2: 21 mmol/L (ref 20–32)
Calcium: 9.8 mg/dL (ref 8.6–10.2)
Chloride: 104 mmol/L (ref 98–110)
Creat: 0.97 mg/dL (ref 0.50–1.10)
GFR, Est African American: 81 mL/min/{1.73_m2} (ref >=60)
GFR, Est Non African American: 70 mL/min/{1.73_m2} (ref >=60)
Globulin: 3.2 g/dL (calc) (ref 1.9–3.7)
Glucose, Bld: 102 mg/dL — ABNORMAL HIGH (ref 65–99)
Potassium: 4.3 mmol/L (ref 3.5–5.3)
Sodium: 138 mmol/L (ref 135–146)
Total Bilirubin: 0.4 mg/dL (ref 0.2–1.2)
Total Protein: 7.9 g/dL (ref 6.1–8.1)

## 2019-11-27 MED ORDER — ONDANSETRON HCL 4 MG/2ML IJ SOLN
4.0000 mg | Freq: Once | INTRAMUSCULAR | Status: AC
  Administered 2019-11-27: 4 mg via INTRAMUSCULAR

## 2019-11-27 MED ORDER — ONDANSETRON 4 MG PO TBDP: 4.0000 mg | ORAL_TABLET | Freq: Three times a day (TID) | ORAL | 0 refills | Status: DC | PRN

## 2019-11-27 NOTE — Assessment & Plan Note (Signed)
Acute Severe nausea and vomiting - ( 2/day) Started 8/7 - ? Cause Has not been taking zofran - fearful of side effects Stressed that she needs to take the zofran so she can drink/eat which may help her feel better - need to avoid dehydration and may improve HA's, lightheadedness Cbc, cmp Update me tomorrow with her symptoms Consider GI referral

## 2019-11-27 NOTE — Assessment & Plan Note (Signed)
Acute  Dry, intermittent cough, inc SOB No fever cxr to r/o bronchitis/ PNA ? Irritation from vomiting Symptomatic treatment

## 2019-11-27 NOTE — Patient Instructions (Addendum)
Hold the metformin for now until you are feeling better.     Have blood work and a chest xray today downstairs.   Take the zofran three times a day so you can drink more fluids and try to eat a little as tolerated.

## 2019-11-28 ENCOUNTER — Telehealth: Payer: Self-pay | Admitting: Internal Medicine

## 2019-11-28 ENCOUNTER — Encounter: Payer: Self-pay | Admitting: Internal Medicine

## 2019-11-28 MED ORDER — AZITHROMYCIN 250 MG PO TABS
ORAL_TABLET | ORAL | 0 refills | Status: DC
Start: 2019-11-28 — End: 2019-12-19

## 2019-11-28 NOTE — Telephone Encounter (Signed)
When she is feeling better and eating normally.

## 2019-11-28 NOTE — Telephone Encounter (Signed)
Her blood work shows normal kidney and liver tests. Her calcium and potassium are normal. Her white blood count is a little elevated which can indicate an infection.  She does not have anemia.   Her chest xray showed a little thickening in her airways which can indicate early bronchitis.  Lets try a zpak.  Continue the zofran and push fluids as much as possible.  Start a probiotic ( otc pill) since she has been on too many antibiotics - this helps rebalance the good bacteria.     zpak sent to pharmacy

## 2019-11-28 NOTE — Telephone Encounter (Signed)
New message    The patient is asking can the CMA called her back, please.   Aware the mychart message was sent to Dr. Quay Burow waiting on a response.

## 2019-11-28 NOTE — Telephone Encounter (Signed)
Spoke with patient today. 

## 2019-12-01 NOTE — Progress Notes (Addendum)
Cardiology Office Note  Date: 12/02/2019   ID: [name], DOB 31-Dec-1970, MRN [id number]  PCP:  Binnie Rail, MD  Cardiologist:  No primary care provider on file. Electrophysiologist:  None   Chief Complaint: Follow-up paroxysmal ventricular tachycardia  History of Present Illness: [name] is a 49 y.o. female with a history of paroxysmal ventricular tachycardia, severe sleep apnea, HTN, paroxysmal SVT, chest pain, anxiety with panic disorder, shortness of breath, morbid obesity.  Cardiac event monitor demonstrated sinus rhythm with proximal nonsustained ventricular tachycardia with 6 beat and 10 beat run noted, 1 of which was symptomatic.  Most symptoms corresponded with sinus rhythm.  At a previous visit Dr. Bronson Ing had recommended she try Lopressor 25 mg p.o. twice daily.  She stated this made her dizzy.  The dose was decreased to 12.5 mg p.o. twice daily.  Last office visit with Dr. Bronson Ing 09/06/2019 she was seldom having palpitations.  She denied any chest pain.  She was considering bariatric surgery.  She was following with [facility] for adrenal adenoma.  Her EKG during the visit demonstrated normal sinus rhythm no ischemic ST segment or T wave abnormalities or arrhythmias.  She was to continue Lopressor 12.5 mg daily.  Her coronary CT was normal.  She had no recurrences of PSVT and was continuing Lopressor 12.5 mg p.o. twice daily.  He was continuing her CPAP due to severe sleep apnea.  Her blood pressure was normal.  There were no changes to therapy.  Patient is here for for follow-up of her palpitations.  She states she still has palpitations but less so since taking metoprolol.  She states mostly notices them at night.  States she has been having some other medical issues including in respiratory infections/bronchitis for which she was treated with Z-Pak.  She states no one ever did a chest x-ray on her to check to see if the bronchitis had  resolved.  States she has had some nausea and weight loss recently.  Approximately 20 pounds weight loss which was unintentional.  She does have severe morbid obesity with obstructive sleep apnea PCOS.   Past Medical History:  Diagnosis Date  . Anxiety   . ASTHMA   . B12 deficiency   . DELAYED GASTRIC EMPTYING   . DEPRESSION   . GERD   . Glucose intolerance (impaired glucose tolerance) 04/09/2015  . Hypertension   . OBESITY   . OBSTRUCTIVE SLEEP APNEA   . POLYCYSTIC OVARIAN DISEASE   . Prediabetes   . SMOKER   . SOB (shortness of breath)   . SVT (supraventricular tachycardia) ([place])   . Tachycardia   . Umbilical hernia   . Umbilical hernia   . Vitamin B12 deficiency 03/2015 dx   start IM replacement q 30d  . Vitamin D deficiency 03/2015 dx    Past Surgical History:  Procedure Laterality Date  . CESAREAN SECTION  97 & 98   x's 2  . Cocxyl removal  1995  . DILATION AND CURETTAGE OF UTERUS    . LAPAROSCOPY    . TUBAL LIGATION  1998    Current Outpatient Medications  Medication Sig Dispense Refill  . albuterol (VENTOLIN HFA) 108 (90 Base) MCG/ACT inhaler Inhale 2 puffs into the lungs every 6 (six) hours as needed for wheezing or shortness of breath.    . ALPRAZolam (XANAX) 0.5 MG tablet Take 1 tablet (0.5 mg total) by mouth 3 (three) times daily as needed for anxiety. 60 tablet 0  .  azithromycin (ZITHROMAX) 250 MG tablet Take two tabs the first day and then one tab daily for four days 6 tablet 0  . loratadine (CLARITIN) 10 MG tablet Take 10 mg by mouth in the morning.    [name] losartan (COZAAR) 25 MG tablet Take 1 tablet (25 mg total) by mouth daily. 30 tablet 0  . metoprolol tartrate (LOPRESSOR) 25 MG tablet Take 0.5 tablets (12.5 mg total) by mouth 2 (two) times daily. 90 tablet 3  . ondansetron (ZOFRAN-ODT) 4 MG disintegrating tablet Take 1 tablet (4 mg total) by mouth every 8 (eight) hours as needed for nausea or vomiting. 30 tablet 0  . pantoprazole (PROTONIX) 40 MG tablet  TAKE 1 TABLET BY MOUTH EVERY DAY (Patient taking differently: Take 40 mg by mouth daily before breakfast. ) 90 tablet 3  . PARoxetine (PAXIL) 10 MG tablet Take 1 tablet (10 mg total) by mouth daily. (Patient taking differently: Take 5 mg by mouth at bedtime. ) 30 tablet 0  . PRESCRIPTION MEDICATION CPAP- At bedtime and during any time of rest    . topiramate (TOPAMAX) 50 MG tablet Take 1 tablet (50 mg total) by mouth daily. (Patient taking differently: Take 50 mg by mouth at bedtime. ) 30 tablet 0  . valACYclovir (VALTREX) 1000 MG tablet TAKE 2 TABLETS EVERY 12 HOURS FOR 1 DAY START ASAP AFTER SYTPTOM ONSET (Patient taking differently: Take 2,000 mg by mouth See admin instructions. Take 2,000 mg by mouth every 12 hours for 1 day, starting ASAP, as directed, after onset of symptom(s)) 12 tablet 3  . Vitamin D, Ergocalciferol, (DRISDOL) 1.25 MG (50000 UNIT) CAPS capsule Take 1 capsule (50,000 Units total) by mouth every Monday. 4 capsule 0  . metFORMIN (GLUCOPHAGE) 500 MG tablet Take 1 tablet (500 mg total) by mouth 2 (two) times daily with a meal. (Patient not taking: Reported on 12/02/2019) 60 tablet 0   Current Facility-Administered Medications  Medication Dose Route Frequency Provider Last Rate Last Admin  . cyanocobalamin ((VITAMIN B-12)) injection 1,000 mcg  1,000 mcg Intramuscular Q30 days Binnie Rail, MD   1,000 mcg at 10/18/19 1610   Allergies:  Doxycycline and Tape   Social History: The patient  reports that she has been smoking cigarettes. She started smoking about 32 years ago. She has a 7.50 pack-year smoking history. She has never used smokeless tobacco. She reports current alcohol use. She reports that she does not use drugs.   Family History: The patient's family history includes Anxiety disorder in her mother; Breast cancer in her mother; Depression in her father; Diabetes in her maternal aunt and mother; Heart disease in her paternal grandmother; Heart murmur in her sister; High  Cholesterol in her father; High blood pressure in her father and mother; Obesity in her mother; Pancreatic cancer (age of onset: 69) in her paternal grandmother; Prostate cancer (age of onset: 17) in her maternal grandfather; Thyroid disease in her mother.   ROS:  Please see the history of present illness. Otherwise, complete review of systems is positive for none.  All other systems are reviewed and negative.   Physical Exam: VS:  BP 132/70   Pulse 82   Ht 5' 5"  (1.651 m)   Wt (!) 353 lb (160.1 kg)   LMP 11/21/2019 (Exact Date)   SpO2 98%   BMI 58.74 kg/m , BMI Body mass index is 58.74 kg/m.  Wt Readings from Last 3 Encounters:  12/02/19 (!) 353 lb (160.1 kg)  11/27/19 (!) 349 lb (  158.3 kg)  11/24/19 (!) 370 lb (167.8 kg)    General: Severe morbidly obese patient appears comfortable at rest. Neck: Supple, no elevated JVP or carotid bruits, no thyromegaly. Lungs: Clear to auscultation, nonlabored breathing at rest. Cardiac: Regular rate and rhythm, no S3 or significant systolic murmur, no pericardial rub. Extremities: No pitting edema, distal pulses 2+. Skin: Warm and dry. Musculoskeletal: No kyphosis. Neuropsychiatric: Alert and oriented x3, affect grossly appropriate.  ECG:  EKG November 24, 2019 normal sinus rhythm rate of 74.  Recent Labwork: 07/19/2019: TSH 2.140 11/27/2019: ALT 20; AST 8; BUN 11; Creat 0.97; Hemoglobin 12.6; Platelets 478; Potassium 4.3; Sodium 138     Component Value Date/Time   CHOL 177 06/11/2019 0732   CHOL 189 03/12/2019 0000   TRIG 144.0 06/11/2019 0732   HDL 39.00 (L) 06/11/2019 0732   HDL 42 03/12/2019 0000   CHOLHDL 5 06/11/2019 0732   VLDL 28.8 06/11/2019 0732   LDLCALC 109 (H) 06/11/2019 0732   LDLCALC 127 (H) 03/12/2019 0000   LDLDIRECT 154.1 08/16/2012 0828    Other Studies Reviewed Today:  Coronary CT angiogram (06/18/18):  IMPRESSION: 1. Coronary calcium score of 0. This was 0 percentile for age and sex matched control.  2.  Normal coronary origin with right dominance.  3. Study quality and ability to assess the distal vessels is limited by body habitus.  4. No evidence of CAD in the proximal to mid coronary arteries.  Assessment and Plan:  1. NSVT (nonsustained ventricular tachycardia) (HCC)   2. Chest pain, unspecified type   3. PSVT (paroxysmal supraventricular tachycardia) ([place])   4. Severe sleep apnea   5. Essential hypertension    1. NSVT (nonsustained ventricular tachycardia) (HCC) Denies any significant episodes of palpitations.  States she has occasional palpitations but heart rate is usually in the 80s.  Continue metoprolol 12.5 mg p.o. twice daily  2. Chest pain, unspecified type Denies any classic chest pain but states she had some recent chest tightness and is unsure if it is due to her recent upper respiratory infection/bronchitis.  She had a negative coronary CT scan last year.  No classic anginal or exertional symptoms.  3. PSVT (paroxysmal supraventricular tachycardia) ([place]) States she notices occasional palpitations but not bothersome or symptomatic.  Continue metoprolol 12.5 mg p.o. twice daily.  4. Severe sleep apnea Compliant with CPAP  5. Essential hypertension Blood pressure 132/70 today.  Continue losartan 25 mg p.o. daily, metoprolol 12.5 mg p.o. twice daily.  6.  Nausea, vomiting,  unexplained weight loss. Patient states since early August she has been having nausea and some vomiting along with some unexplained weight loss.  She states she used to see a provider Dr. Johna Roles  At [facility] in [place].  She would like to be referred back to this clinic for evaluation of nausea, vomiting, unexplained weight loss.  7.  Recent upper respiratory infection/bronchitis. Patient states she continues to have some chest tightness and is uncertain as to whether it is related to her heart or her recent upper respiratory infection.  She states she never had a chest x-ray.  Please send  the patient for chest x-ray.  Medication Adjustments/Labs and Tests Ordered: Current medicines are reviewed at length with the patient today.  Concerns regarding medicines are outlined above.   Disposition: Follow-up with Dr. Harl Bowie or APP 6 months  Signed, Levell July, NP 12/02/2019 9:03 AM    [facility] [address], [place],  [place] [address] Phone: [phone]; Fax: [phone]

## 2019-12-02 ENCOUNTER — Other Ambulatory Visit: Payer: Self-pay

## 2019-12-02 ENCOUNTER — Encounter: Payer: Self-pay | Admitting: Family Medicine

## 2019-12-02 ENCOUNTER — Ambulatory Visit: Payer: BC Managed Care – PPO | Admitting: Family Medicine

## 2019-12-02 ENCOUNTER — Ambulatory Visit (HOSPITAL_COMMUNITY)
Admission: RE | Admit: 2019-12-02 | Discharge: 2019-12-02 | Disposition: A | Discharge status: Home / Self Care | Payer: BC Managed Care – PPO | Source: Ambulatory Visit | Attending: Family Medicine | Admitting: Family Medicine

## 2019-12-02 VITALS — BP 132/70 | HR 82 | Ht 65.0 in | Wt 353.0 lb

## 2019-12-02 DIAGNOSIS — I471 Supraventricular tachycardia: Secondary | ICD-10-CM | POA: Diagnosis not present

## 2019-12-02 DIAGNOSIS — I472 Ventricular tachycardia: Secondary | ICD-10-CM | POA: Diagnosis not present

## 2019-12-02 DIAGNOSIS — R0789 Other chest pain: Secondary | ICD-10-CM | POA: Insufficient documentation

## 2019-12-02 DIAGNOSIS — I10 Essential (primary) hypertension: Secondary | ICD-10-CM

## 2019-12-02 DIAGNOSIS — G4730 Sleep apnea, unspecified: Secondary | ICD-10-CM

## 2019-12-02 DIAGNOSIS — R079 Chest pain, unspecified: Secondary | ICD-10-CM | POA: Diagnosis not present

## 2019-12-02 DIAGNOSIS — R112 Nausea with vomiting, unspecified: Secondary | ICD-10-CM

## 2019-12-02 DIAGNOSIS — I4729 Other ventricular tachycardia: Secondary | ICD-10-CM

## 2019-12-02 MED ORDER — METOPROLOL TARTRATE 25 MG PO TABS: 12.5000 mg | ORAL_TABLET | Freq: Two times a day (BID) | ORAL | 3 refills | Status: DC

## 2019-12-02 NOTE — Patient Instructions (Addendum)
Medication Instructions:  Continue all current medications.  Labwork: none  Testing/Procedures:  A chest x-ray takes a picture of the organs and structures inside the chest, including the heart, lungs, and blood vessels. This test can show several things, including, whether the heart is enlarges; whether fluid is building up in the lungs; and whether pacemaker / defibrillator leads are still in place.  Office will contact with results via phone or letter.    Follow-Up: 6 months   Any Other Special Instructions Will Be Listed Below (If Applicable). You have been referred to:  [facility]   If you need a refill on your cardiac medications before your next appointment, please call your pharmacy.

## 2019-12-03 ENCOUNTER — Telehealth: Payer: Self-pay | Admitting: Gastroenterology

## 2019-12-03 ENCOUNTER — Encounter: Payer: Self-pay | Admitting: Gastroenterology

## 2019-12-04 NOTE — Telephone Encounter (Signed)
Spoke with the patient. 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 She states she awakens in the night from her sleep feeling sick and sometimes in pain. She has not had a bowel movement in 4 days. She is passing gas, and feels an urge to go.  She will drink a glass of Miralax daily until she produces a bowel movement. Appointment next week with APP.

## 2019-12-05 ENCOUNTER — Encounter: Payer: Self-pay | Admitting: Internal Medicine

## 2019-12-09 ENCOUNTER — Other Ambulatory Visit: Payer: BC Managed Care – PPO | Admitting: Obstetrics and Gynecology

## 2019-12-11 ENCOUNTER — Encounter: Payer: Self-pay | Admitting: Internal Medicine

## 2019-12-11 ENCOUNTER — Encounter (INDEPENDENT_AMBULATORY_CARE_PROVIDER_SITE_OTHER): Payer: Self-pay | Admitting: Family Medicine

## 2019-12-11 ENCOUNTER — Ambulatory Visit: Payer: BC Managed Care – PPO | Admitting: Psychology

## 2019-12-11 ENCOUNTER — Ambulatory Visit (INDEPENDENT_AMBULATORY_CARE_PROVIDER_SITE_OTHER): Payer: BC Managed Care – PPO | Admitting: Family Medicine

## 2019-12-11 ENCOUNTER — Other Ambulatory Visit: Payer: Self-pay

## 2019-12-11 VITALS — BP 131/76 | HR 68 | Temp 97.9°F | Ht 65.0 in | Wt 345.0 lb

## 2019-12-11 DIAGNOSIS — E7849 Other hyperlipidemia: Secondary | ICD-10-CM

## 2019-12-11 DIAGNOSIS — D72829 Elevated white blood cell count, unspecified: Secondary | ICD-10-CM

## 2019-12-11 DIAGNOSIS — Z6841 Body Mass Index (BMI) 40.0 and over, adult: Secondary | ICD-10-CM

## 2019-12-11 DIAGNOSIS — Z9189 Other specified personal risk factors, not elsewhere classified: Secondary | ICD-10-CM

## 2019-12-11 DIAGNOSIS — F411 Generalized anxiety disorder: Secondary | ICD-10-CM

## 2019-12-11 DIAGNOSIS — R7303 Prediabetes: Secondary | ICD-10-CM

## 2019-12-11 DIAGNOSIS — E559 Vitamin D deficiency, unspecified: Secondary | ICD-10-CM

## 2019-12-11 DIAGNOSIS — I10 Essential (primary) hypertension: Secondary | ICD-10-CM

## 2019-12-11 DIAGNOSIS — R630 Anorexia: Secondary | ICD-10-CM

## 2019-12-11 MED ORDER — LOSARTAN POTASSIUM 25 MG PO TABS: 25.0000 mg | ORAL_TABLET | Freq: Every day | ORAL | 0 refills | Status: DC

## 2019-12-11 MED ORDER — VITAMIN D (ERGOCALCIFEROL) 1.25 MG (50000 UNIT) PO CAPS: 50000.0000 [IU] | ORAL_CAPSULE | ORAL | 0 refills | Status: DC

## 2019-12-11 MED ORDER — PAROXETINE HCL 10 MG PO TABS: 10.0000 mg | ORAL_TABLET | Freq: Every day | ORAL | 0 refills | Status: DC

## 2019-12-11 MED ORDER — TOPIRAMATE 50 MG PO TABS: 50.0000 mg | ORAL_TABLET | Freq: Every day | ORAL | 0 refills | Status: DC

## 2019-12-12 ENCOUNTER — Encounter: Payer: Self-pay | Admitting: Nurse Practitioner

## 2019-12-12 ENCOUNTER — Encounter (INDEPENDENT_AMBULATORY_CARE_PROVIDER_SITE_OTHER): Payer: Self-pay | Admitting: Family Medicine

## 2019-12-12 ENCOUNTER — Ambulatory Visit: Payer: BC Managed Care – PPO | Admitting: Nurse Practitioner

## 2019-12-12 ENCOUNTER — Telehealth: Payer: Self-pay | Admitting: General Surgery

## 2019-12-12 VITALS — BP 142/76 | HR 76 | Ht 65.0 in | Wt 347.0 lb

## 2019-12-12 DIAGNOSIS — R112 Nausea with vomiting, unspecified: Secondary | ICD-10-CM

## 2019-12-12 DIAGNOSIS — K429 Umbilical hernia without obstruction or gangrene: Secondary | ICD-10-CM | POA: Diagnosis not present

## 2019-12-12 DIAGNOSIS — R109 Unspecified abdominal pain: Secondary | ICD-10-CM

## 2019-12-12 LAB — CBC WITH DIFFERENTIAL/PLATELET
Basophils Absolute: 0 10*3/uL (ref 0.0–0.2)
Basos: 0 %
EOS (ABSOLUTE): 0 10*3/uL (ref 0.0–0.4)
Eos: 1 %
Hematocrit: 35.7 % (ref 34.0–46.6)
Hemoglobin: 11.7 g/dL (ref 11.1–15.9)
Immature Grans (Abs): 0 10*3/uL (ref 0.0–0.1)
Immature Granulocytes: 0 %
Lymphocytes Absolute: 1.2 10*3/uL (ref 0.7–3.1)
Lymphs: 15 %
MCH: 28.7 pg (ref 26.6–33.0)
MCHC: 32.8 g/dL (ref 31.5–35.7)
MCV: 88 fL (ref 79–97)
Monocytes Absolute: 0.4 10*3/uL (ref 0.1–0.9)
Monocytes: 5 %
Neutrophils Absolute: 6.5 10*3/uL (ref 1.4–7.0)
Neutrophils: 79 %
Platelets: 353 10*3/uL (ref 150–450)
RBC: 4.08 x10E6/uL (ref 3.77–5.28)
RDW: 13.8 % (ref 11.7–15.4)
WBC: 8.1 10*3/uL (ref 3.4–10.8)

## 2019-12-12 LAB — LIPID PANEL WITH LDL/HDL RATIO
Cholesterol, Total: 185 mg/dL (ref 100–199)
HDL: 36 mg/dL — ABNORMAL LOW (ref >39)
LDL Chol Calc (NIH): 126 mg/dL — ABNORMAL HIGH (ref 0–99)
LDL/HDL Ratio: 3.5 ratio — ABNORMAL HIGH (ref 0.0–3.2)
Triglycerides: 127 mg/dL (ref 0–149)
VLDL Cholesterol Cal: 23 mg/dL (ref 5–40)

## 2019-12-12 LAB — HEMOGLOBIN A1C
Est. average glucose Bld gHb Est-mCnc: 108 mg/dL
Hgb A1c MFr Bld: 5.4 % (ref 4.8–5.6)

## 2019-12-12 LAB — VITAMIN D 25 HYDROXY (VIT D DEFICIENCY, FRACTURES): Vit D, 25-Hydroxy: 53.6 ng/mL (ref 30.0–100.0)

## 2019-12-12 LAB — INSULIN, RANDOM: INSULIN: 38.8 u[IU]/mL — ABNORMAL HIGH (ref 2.6–24.9)

## 2019-12-12 NOTE — Patient Instructions (Addendum)
If you are age 48 or older, your body mass index should be between 23-30. Your Body mass index is 57.74 kg/m. If this is out of the aforementioned range listed, please consider follow up with your Primary Care Provider.  If you are age 71 or younger, your body mass index should be between 19-25. Your Body mass index is 57.74 kg/m. If this is out of the aformentioned range listed, please consider follow up with your Primary Care Provider.   We have place an order for you to have a CT of the abdomen and Pelvis with Contrast at [facility]. Our telephone system is down and I am unable to schedule this at this time. I will contact you later today or tomorrow morning with a time.  1. Continue with ondansetron for nausea as needed. 2. Take Miralax 1 capful mixed in 8 ounces of water at bed time for constipation as tolerated. 3.avoid fatty foods 4. Follow up with Dr Silverio Decamp in 2-3 weeks. We will call you with an appointment.   Due to recent changes in healthcare laws, you may see the results of your imaging and laboratory studies on MyChart before your provider has had a chance to review them.  We understand that in some cases there may be results that are confusing or concerning to you. Not all laboratory results come back in the same time frame and the provider may be waiting for multiple results in order to interpret others.  Please give [place] 48 hours in order for your provider to thoroughly review all the results before contacting the office for clarification of your results.   Thank you for choosing [facility] Noralyn Pick, CRNP

## 2019-12-12 NOTE — Progress Notes (Signed)
Chief Complaint:   OBESITY [name] is here to discuss her progress with her obesity treatment plan along with follow-up of her obesity related diagnoses. [name] is on the Category 3 Plan and states she is following her eating plan approximately 0% of the time. Phinley states she is doing 0 minutes 0 times per week.  Today's visit was #: 24 Starting weight: 368 lbs Starting date: 02/14/2018 Today's weight: 345 lbs Today's date: 12/11/2019 Total lbs lost to date: 23 Total lbs lost since last in-office visit: 13  Interim History: [name] notes severe anxiety since her COVID shot with various somatic symptoms. She feels that there is something seriously wrong with her despite being reassured by various providers and negative lab tests.  She has anorexia, and she is down 18 lbs since she had her COVID shots. She is supplementing with some protein shakes and she is staying hydrated.    Subjective:   1. Pre-diabetes [name] has a diagnosis of pre-diabetes based on her elevated Hgb A1c and was informed this puts her at greater risk of developing diabetes. Last A1c was 6.0. She has been off of metformin due to GI upset. She continues to work on diet and exercise to decrease her risk of diabetes.  Lab Results  Component Value Date   HGBA1C 5.4 12/11/2019   Lab Results  Component Value Date   INSULIN 38.8 (H) 12/11/2019   INSULIN 38.0 (H) 07/19/2019   INSULIN 27.7 (H) 03/12/2019   INSULIN 2.0 (L) 09/28/2018   INSULIN 19.1 02/14/2018   2. Leukocytosis, unspecified type [name] is concerned about her white count. It was mildly elevated but she has had recent bronchitis and UTI.  3. GAD (generalized anxiety disorder) [name] has had serious anxiety recently and has lost 18 lbs due to anorexia. She is seeing a Social worker bi-weekly.  4. Vitamin D deficiency [name]'s last Vit D level was low at 47. She is on prescription Vit D.  5. Other hyperlipidemia Denali has hyperlipidemia and has been trying to improve  her cholesterol levels with intensive lifestyle modification including a low saturated fat diet, exercise and weight loss. [name] is not on statin. Last LDL was elevated at 109, and HDL was low at 39. She denies any chest pain, claudication or myalgias.  Lab Results  Component Value Date   ALT 20 11/27/2019   AST 8 (L) 11/27/2019   ALKPHOS 75 11/16/2019   BILITOT 0.4 11/27/2019   Lab Results  Component Value Date   CHOL 185 12/11/2019   HDL 36 (L) 12/11/2019   LDLCALC 126 (H) 12/11/2019   LDLDIRECT 154.1 08/16/2012   TRIG 127 12/11/2019   CHOLHDL 5 06/11/2019   6. Essential hypertension [name]'s blood pressure is well controlled. Cardiovascular ROS: no chest pain or dyspnea on exertion.  BP Readings from Last 3 Encounters:  12/11/19 131/76  12/02/19 132/70  11/27/19 (!) 144/84   Lab Results  Component Value Date   CREATININE 0.97 11/27/2019   CREATININE 0.96 11/24/2019   CREATININE 1.03 (H) 11/16/2019   7. Anorexia [name] notes anorexia, nausea, and constipation. She has had these symptoms since she had COVID. She is very anxious since she had the COVID vaccines.  8. At risk for depression [name] is at elevated risk of depression due to severe anxiety.  Assessment/Plan:   1. Pre-diabetes [name] will continue to work on weight loss, exercise, and decreasing simple carbohydrates to help decrease the risk of diabetes. We will check labs today.  -  Hemoglobin A1c - Insulin, random  2. Leukocytosis, unspecified type We will check labs today, and [name] will continue to follow up as directed.  - CBC with Differential/Platelet  3. GAD (generalized anxiety disorder) Behavior modification techniques were discussed today to help [name] deal with her anxiety. Pier agreed to increase Paxil to 10 mg q daily with no refills. We had been attempting to slowly wean off of Paxil but I feel she needs to increase her dose at this time. We will refill Topamax for 1 month. We will refer to Psychiatry  for evaluation and treatment. - PARoxetine (PAXIL) 10 MG tablet; Take 1 tablet (10 mg total) by mouth daily.  Dispense: 30 tablet; Refill: 0 - topiramate (TOPAMAX) 50 MG tablet; Take 1 tablet (50 mg total) by mouth daily.  Dispense: 30 tablet; Refill: 0 - Ambulatory referral to Psychiatry  4. Vitamin D deficiency Low Vitamin D level contributes to fatigue and are associated with obesity, breast, and colon cancer. We will check labs today, and we will refill prescription Vitamin D for 1 month. [name] will follow-up for routine testing of Vitamin D, at least 2-3 times per year to avoid over-replacement.  - VITAMIN D 25 Hydroxy (Vit-D Deficiency, Fractures) - Vitamin D, Ergocalciferol, (DRISDOL) 1.25 MG (50000 UNIT) CAPS capsule; Take 1 capsule (50,000 Units total) by mouth every Monday.  Dispense: 4 capsule; Refill: 0  5. Other hyperlipidemia Cardiovascular risk and specific lipid/LDL goals reviewed. We discussed several lifestyle modifications today. [name] will continue to work on diet, exercise and weight loss efforts. We will check labs today.  - Lipid Panel With LDL/HDL Ratio  6. Essential hypertension [name] is working on healthy weight loss and exercise to improve blood pressure control. We will watch for signs of hypotension as she continues her lifestyle modifications. We will refill losartan for 1 month.  - losartan (COZAAR) 25 MG tablet; Take 1 tablet (25 mg total) by mouth daily.  Dispense: 30 tablet; Refill: 0  7. Anorexia [name] will follow up with GI tomorrow.  8. At risk for depression [name] was given approximately 15 minutes of depression risk counseling today. She has risk factors for depression. We discussed the importance of a healthy work life balance, a healthy relationship with food and a good support system.  Repetitive spaced learning was employed today to elicit superior memory formation and behavioral change.  9. Class 3 severe obesity with serious comorbidity and body  mass index (BMI) of 50.0 to 59.9 in adult, unspecified obesity type ([name]) [name] is currently in the action stage of change. As such, her goal is to continue with weight loss efforts. She has agreed to practicing portion control and making smarter food choices, such as increasing vegetables and decreasing simple carbohydrates.   We discussed sources of protein that [place] will be able to eat.  Exercise goals: No exercise has been prescribed at this time.  Behavioral modification strategies: increasing lean protein intake and decreasing liquid calories.  [name] has agreed to follow-up with our clinic in 3 weeks. She was informed of the importance of frequent follow-up visits to maximize her success with intensive lifestyle modifications for her multiple health conditions.   [name] was informed we would discuss her lab results at her next visit unless there is a critical issue that needs to be addressed sooner. [name] agreed to keep her next visit at the agreed upon time to discuss these results.  Objective:   Blood pressure 131/76, pulse 68, temperature 97.9 F (36.6 C),  temperature source Oral, height 5' 5"  (1.651 m), weight (!) 345 lb (156.5 kg), last menstrual period 11/21/2019, SpO2 99 %. Body mass index is 57.41 kg/m.  General: Cooperative, alert, well developed, in no acute distress. HEENT: Conjunctivae and lids unremarkable. Cardiovascular: Regular rhythm.  Lungs: Normal work of breathing. Neurologic: No focal deficits.   Lab Results  Component Value Date   CREATININE 0.97 11/27/2019   BUN 11 11/27/2019   NA 138 11/27/2019   K 4.3 11/27/2019   CL 104 11/27/2019   CO2 21 11/27/2019   Lab Results  Component Value Date   ALT 20 11/27/2019   AST 8 (L) 11/27/2019   ALKPHOS 75 11/16/2019   BILITOT 0.4 11/27/2019   Lab Results  Component Value Date   HGBA1C 5.4 12/11/2019   HGBA1C 6.0 06/11/2019   HGBA1C 5.8 (H) 03/12/2019   HGBA1C 5.8 (H) 09/28/2018   HGBA1C 5.7 (H) 02/14/2018     Lab Results  Component Value Date   INSULIN 38.8 (H) 12/11/2019   INSULIN 38.0 (H) 07/19/2019   INSULIN 27.7 (H) 03/12/2019   INSULIN 2.0 (L) 09/28/2018   INSULIN 19.1 02/14/2018   Lab Results  Component Value Date   TSH 2.140 07/19/2019   Lab Results  Component Value Date   CHOL 185 12/11/2019   HDL 36 (L) 12/11/2019   LDLCALC 126 (H) 12/11/2019   LDLDIRECT 154.1 08/16/2012   TRIG 127 12/11/2019   CHOLHDL 5 06/11/2019   Lab Results  Component Value Date   WBC 8.1 12/11/2019   HGB 11.7 12/11/2019   HCT 35.7 12/11/2019   MCV 88 12/11/2019   PLT 353 12/11/2019   No results found for: IRON, TIBC, FERRITIN  Attestation Statements:   Reviewed by clinician on day of visit: allergies, medications, problem list, medical history, surgical history, family history, social history, and previous encounter notes.   Wilhemena Durie, am acting as Location manager for [name] Schwab, FNP-C.  I have reviewed the above documentation for accuracy and completeness, and I agree with the above. -  Georgianne Fick, FNP

## 2019-12-12 NOTE — Progress Notes (Signed)
12/12/2019 [name] [id number] 1970/08/25   CHIEF COMPLAINT: Nausea and vomiting   HISTORY OF PRESENT ILLNESS:  [name] is a 49 year old female with a past medical history of anxiety, obesity, SVT, asthma, OSA uses cpap,  polycystic ovarian syndrome, vitamin D deficiency, vitamin B12 deficiency, fatty liver and GERD.  S/P C section x 2. Tubal ligation. Coccyx surgery.   She presented to [facility] ED on 11/16/2019 with hematuria. She was recently treated with Keflex for a UTI with plans to see urology within the next month. She also reported having vaginal bleeding since she received her Covid vaccination 11/09/2019. She was advised to follow up with her gynecologist and to proceed with her urology consult as scheduled.   She presented to [facility] ED on 11/24/2019 with complaints of nausea, chest tightness, palpitations and weakness for a few days. A 12 lead EKG was normal. Laboratory studies were unremarkable. She declined Covid 19 testing. She was discharged home on Zofran.   She had chest discomfort which was similar when she had pneumonia in the past. She was seen by her PCP. WBC 12.3. She was  prescribed Azithromycin  11/28/2019 due to having a chest xray which showed bronchial thickening. Her chest discomfort has resolved.  She was last seen in our office by Dr. Silverio Decamp 05/04/2016 due to having LUQ pain which radiated down her back was assessed to most likely be musculoskeletal.   She was referred to our office today by Levell July NP for further evaluation for nausea which started on 8/6 then a few days later she developed vomiting. She describes having clear emesis. No hematemesis. She continues to vomit 1 to 2 times most days. She is mostly eating chicken noodle soup in the evening. Yesterday, she ate a baked potato and chicken without vomiting. However, today she gagged and vomited clear secretions. She is taking Zofran as needed. No dysphagia. Infrequent heartburn. She is  taking Pantoprazole 35m QD. EGD in 2010 showed esophagitis. No upper abdominal pain. She complains of having lower abdominal pain which started 2 1/2 weeks ago, comes and goes. She was constipated a few weeks ago, no BM x 5 days.  She started taking Miralax and she is passing a soft brown stool most days. No black stools or melena. She stated having IBS and Dr. BOlevia Perchesinitially prescribed Paxil for her GI symptoms 10 yeas ago. She has anxiety and she remains on Paxil since prescribed by Dr. BOlevia Perches  [facility]. The provider at the weight and wellness program reduced her Paxil from 10 to 520mdaily with concerns this medication was contributing to her weight gain and inability to lose weight. She has lost 21 lbs over the past 4 weeks which is partially due to eating a healthier diet but also due to eating less secondary to having nausea and vomiting. Her anxiety has increased since Paxil was reduced so her PCP put her back on Paxil 1022mD. No fever, sweats or chills.    CBC Latest Ref Rng & Units 12/11/2019 11/27/2019 11/24/2019  WBC 3.4 - 10.8 x10E3/uL 8.1 12.3(H) 10.3  Hemoglobin 11.1 - 15.9 g/dL 11.7 12.6 11.5(L)  Hematocrit 34.0 - 46.6 % 35.7 38.9 36.1  Platelets 150 - 450 x10E3/uL 353 478(H) 393   CMP Latest Ref Rng & Units 11/27/2019 11/24/2019 11/16/2019  Glucose 65 - 99 mg/dL 102(H) 112(H) 96  BUN 7 - 25 mg/dL 11 12  10  Creatinine 0.50 - 1.10 mg/dL 0.97 0.96 1.03(H)  Sodium 135 - 146 mmol/L 138 136 140  Potassium 3.5 - 5.3 mmol/L 4.3 3.7 3.8  Chloride 98 - 110 mmol/L 104 106 105  CO2 20 - 32 mmol/L 21 21(L) 22  Calcium 8.6 - 10.2 mg/dL 9.8 8.3(L) 9.3  Total Protein 6.1 - 8.1 g/dL 7.9 - 7.6  Total Bilirubin 0.2 - 1.2 mg/dL 0.4 - 0.4  Alkaline Phos 38 - 126 U/L - - 75  AST 10 - 35 U/L 8(L) - 11(L)  ALT 6 - 29 U/L 20 - 18      EGD 07/24/2008 by Dr. [name]: Esophagitis in the distal esophagus Normal stomach Normal duodenum  Colonoscopy  07/24/2008: Mild diverticulosis in the sigmoid to descending colon Otherwise normal colonoscopy    Past Medical History:  Diagnosis Date  . Anxiety   . ASTHMA   . B12 deficiency   . DELAYED GASTRIC EMPTYING   . DEPRESSION   . GERD   . Glucose intolerance (impaired glucose tolerance) 04/09/2015  . Hypertension   . OBESITY   . OBSTRUCTIVE SLEEP APNEA   . POLYCYSTIC OVARIAN DISEASE   . Prediabetes   . SMOKER   . SOB (shortness of breath)   . SVT (supraventricular tachycardia) ([place])   . Tachycardia   . Umbilical hernia   . Umbilical hernia   . Vitamin B12 deficiency 03/2015 dx   start IM replacement q 30d  . Vitamin D deficiency 03/2015 dx   Past Surgical History:  Procedure Laterality Date  . CESAREAN SECTION  97 & 98   x's 2  . Cocxyl removal  1995  . DILATION AND CURETTAGE OF UTERUS    . LAPAROSCOPY    . TUBAL LIGATION  1998   Social History:  reports that she quit smoking about 2 years ago. Her smoking use included cigarettes. She started smoking about 32 years ago. She has a 7.50 pack-year smoking history. She has never used smokeless tobacco. She reports current alcohol use. She reports that she does not use drugs.   Family History: family history includes Anxiety disorder in her mother; Breast cancer in her mother; Colon polyps in her maternal uncle; Depression in her father; Diabetes in her maternal aunt and mother; Heart disease in her paternal grandmother; Heart murmur in her sister; High Cholesterol in her father; High blood pressure in her father and mother; Obesity in her mother; Pancreatic cancer in her maternal grandmother; Pancreatic cancer (age of onset: 76) in her paternal grandmother; Prostate cancer (age of onset: 5) in her maternal grandfather; Thyroid disease in her mother.   Allergies  Allergen Reactions  . 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 Tape Itching and Rash      Outpatient Encounter  Medications as of 12/12/2019  Medication Sig  . albuterol (VENTOLIN HFA) 108 (90 Base) MCG/ACT inhaler Inhale 2 puffs into the lungs every 6 (six) hours as needed for wheezing or shortness of breath.  . ALPRAZolam (XANAX) 0.5 MG tablet Take 1 tablet (0.5 mg total) by mouth 3 (three) times daily as needed for anxiety.  [name] azithromycin (ZITHROMAX) 250 MG tablet Take two tabs the first day and then one tab daily for four days  . loratadine (CLARITIN) 10 MG tablet Take 10 mg by mouth in the morning.  [name] losartan (COZAAR) 25 MG tablet Take 1 tablet (25 mg total) by mouth daily.  . metoprolol  tartrate (LOPRESSOR) 25 MG tablet Take 0.5 tablets (12.5 mg total) by mouth 2 (two) times daily.  . ondansetron (ZOFRAN-ODT) 4 MG disintegrating tablet Take 1 tablet (4 mg total) by mouth every 8 (eight) hours as needed for nausea or vomiting.  . pantoprazole (PROTONIX) 40 MG tablet TAKE 1 TABLET BY MOUTH EVERY DAY (Patient taking differently: Take 40 mg by mouth daily before breakfast. )  . PARoxetine (PAXIL) 10 MG tablet Take 1 tablet (10 mg total) by mouth daily.  [name] PRESCRIPTION MEDICATION CPAP- At bedtime and during any time of rest  . topiramate (TOPAMAX) 50 MG tablet Take 1 tablet (50 mg total) by mouth daily.  . valACYclovir (VALTREX) 1000 MG tablet TAKE 2 TABLETS EVERY 12 HOURS FOR 1 DAY START ASAP AFTER SYTPTOM ONSET (Patient taking differently: Take 2,000 mg by mouth See admin instructions. Take 2,000 mg by mouth every 12 hours for 1 day, starting ASAP, as directed, after onset of symptom(s))  . [START ON 12/16/2019] Vitamin D, Ergocalciferol, (DRISDOL) 1.25 MG (50000 UNIT) CAPS capsule Take 1 capsule (50,000 Units total) by mouth every Monday.  . [DISCONTINUED] metFORMIN (GLUCOPHAGE) 500 MG tablet Take 1 tablet (500 mg total) by mouth 2 (two) times daily with a meal. (Patient not taking: Reported on 12/11/2019)   Facility-Administered Encounter Medications as of 12/12/2019  Medication  . cyanocobalamin ((VITAMIN  B-12)) injection 1,000 mcg    REVIEW OF SYSTEMS:  Gen: Denies fever, sweats or chills. + 21 lbweight loss.  CV: See HPI. No palpitations or edema. Resp: Denies cough, shortness of breath of hemoptysis.  GI: See HPI.   GU : + recent UTI and vaginal bleeding. See HPI.  MS: Denies joint pain, muscles aches or weakness. Derm: Denies rash, itchiness, skin lesions or unhealing ulcers. Psych: + significant anxiety.  Heme: Denies bruising, bleeding. Neuro:  Denies headaches, dizziness or paresthesias. Endo:  + pre diabetes. No problems with thyroid or adrenal function.    PHYSICAL EXAM: BP (!) 142/76   Pulse 76   Ht 5' 5"  (1.651 m)   Wt (!) 347 lb (157.4 kg)   LMP 11/21/2019 (Exact Date)   BMI 57.74 kg/m  General: Morbidly obese 49 year old female in no acute distress. Head: Normocephalic and atraumatic. Eyes:  Sclerae non-icteric, conjunctive pink. Ears: Normal auditory acuity. Mouth: Dentition intact. No ulcers or lesions.  Neck: Supple, no lymphadenopathy or thyromegaly.  Lungs: Clear bilaterally to auscultation without wheezes, crackles or rhonchi. Heart: Regular rate and rhythm. No murmur, rub or gallop appreciated.  Abdomen: Soft, nondistended. Lower abdominal tenderness without rebound or guarding. Large palpable periumbilical hernia 5 to 6 cm extends above the umbilicus.  No hepatosplenomegaly. Normoactive bowel sounds x 4 quadrants.  Rectal: Deferred/  Musculoskeletal: Symmetrical with no gross deformities. Skin: Warm and dry. No rash or lesions on visible extremities. Extremities: No edema. Neurological: Alert oriented x 4, no focal deficits.  Psychological:  Alert and cooperative. She appears anxious.   ASSESSMENT AND PLAN:  3. 49 year old female with N/V -Continue Ondansetron 28m ODT 1 tab Q 8hrs PRN -Continue Pantoprazole 469mQD -Avoid fatty foods -Near future EGD -CTAP with contrast   2. Lower abdominal pain -CTAP with contrast -Further recommendations to  be determined after CT results reviewed  3. Large umbilical hernia -eventual general surgery consult   4. Recent UTI  5. PCOS   6. Anxiety   7. Weight loss, intentional and due to # 1  8. Fatty liver. Normal LFTs. Weight loss  management per [facility]   Patient to call our office if symptoms worsen Patient to follow up in office in 2 to 3 weeks       CC:  Quay Burow, Claudina Lick, MD

## 2019-12-12 NOTE — Telephone Encounter (Signed)
Spoke with the patient and gave her the date of her CT at [place]. 12/18/2019 @2 :30. She will come to the clinic today to pick up her contrast and her instructions.

## 2019-12-12 NOTE — Progress Notes (Deleted)
     12/12/2019 [name] [id number] June 15, 1970   Chief Complaint:  History of Present Illness: [name] is a 49 year old female with a past medical history of anxiety, depression, obesity,  asthma, OSA, polycystic ovarian disease, vitamin D deficiency and GERD.  She was last seen in our office by Dr. Silverio Decamp 05/04/2016 due to having LUQ pain.   Current Medications, Allergies, Past Medical History, Past Surgical History, Family History and Social History were reviewed in [employer] record.   Physical Exam: LMP 11/21/2019 (Exact Date)  General: Well developed, w   ***female in no acute distress. Head: Normocephalic and atraumatic. Eyes: No scleral icterus. Conjunctiva pink . Ears: Normal auditory acuity. Mouth: Dentition intact. No ulcers or lesions.  Lungs: Clear throughout to auscultation. Heart: Regular rate and rhythm, no murmur. Abdomen: Soft, nontender and nondistended. No masses or hepatomegaly. Normal bowel sounds x 4 quadrants.  Rectal: *** Musculoskeletal: Symmetrical with no gross deformities. Extremities: No edema. Neurological: Alert oriented x 4. No focal deficits.  Psychological: Alert and cooperative. Normal mood and affect  Assessment and Recommendations: ***

## 2019-12-13 ENCOUNTER — Ambulatory Visit (INDEPENDENT_AMBULATORY_CARE_PROVIDER_SITE_OTHER): Payer: BC Managed Care – PPO | Admitting: Psychology

## 2019-12-13 DIAGNOSIS — F411 Generalized anxiety disorder: Secondary | ICD-10-CM

## 2019-12-17 NOTE — Telephone Encounter (Signed)
Please review

## 2019-12-18 ENCOUNTER — Other Ambulatory Visit: Payer: Self-pay

## 2019-12-18 ENCOUNTER — Ambulatory Visit (INDEPENDENT_AMBULATORY_CARE_PROVIDER_SITE_OTHER)
Admission: RE | Admit: 2019-12-18 | Discharge: 2019-12-18 | Disposition: A | Discharge status: Home / Self Care | Payer: BC Managed Care – PPO | Source: Ambulatory Visit | Attending: Nurse Practitioner | Admitting: Nurse Practitioner

## 2019-12-18 DIAGNOSIS — R109 Unspecified abdominal pain: Secondary | ICD-10-CM | POA: Diagnosis not present

## 2019-12-18 DIAGNOSIS — K429 Umbilical hernia without obstruction or gangrene: Secondary | ICD-10-CM | POA: Diagnosis not present

## 2019-12-18 DIAGNOSIS — R112 Nausea with vomiting, unspecified: Secondary | ICD-10-CM

## 2019-12-18 MED ORDER — IOHEXOL 300 MG/ML  SOLN
100.0000 mL | Freq: Once | INTRAMUSCULAR | Status: AC | PRN
  Administered 2019-12-18: 100 mL via INTRAVENOUS

## 2019-12-19 ENCOUNTER — Telehealth: Payer: Self-pay | Admitting: Nurse Practitioner

## 2019-12-19 DIAGNOSIS — I700 Atherosclerosis of aorta: Secondary | ICD-10-CM | POA: Insufficient documentation

## 2019-12-19 NOTE — Progress Notes (Signed)
Subjective:    Patient ID: [name], female    DOB: 09-19-1970, 49 y.o.   MRN: [id number]  HPI The patient is here for follow up of their chronic medical problems, including anxiety, htn   She saw GI on 9/2 for her N/V, lower abd pain - Ct 9/8 showed 2 mm nonobs renal calculus, aortic arthrosclerosis, air fluid levels in sigmoid colon likely related to diarrheal process.  On the CT also mention atherosclerosis, and a number of other things that she wanted to review.  She has nausea and vomiting - mostly in the morning.  Usually when she gets worked up with coughing.  The cough is mostly in the morning.  Nausea is better during the day.  She has felt GERD a few times.  She also has significant nasal congestion when she wakes up in the morning.  She was wondering what she could take for that.  She increased her paxil.  She feels better on the higher dose.  She wakes up 1-2 times a night on the higher dose, but not every 2 hrs.  She is no longer shaking on the inside, so she knows her anxiety is better, but she is still very anxious..     Medications and allergies reviewed with patient and updated if appropriate.  Patient Active Problem List   Diagnosis Date Noted  . Nasal congestion 12/20/2019  . Aortic atherosclerosis ([place]) 12/19/2019  . Cough 11/27/2019  . Non-intractable vomiting 11/27/2019  . Blood in urine 11/08/2019  . Other hyperlipidemia 03/13/2019  . Adrenal adenoma, left 10/10/2018  . 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 Essential hypertension 11/01/2017  . Chronic frontal sinusitis 10/30/2017  . Submandibular gland mass 10/30/2017  . Prediabetes 05/18/2017  . SOB (shortness of breath) 09/24/2016  . Palpitations 09/24/2016  . Family history of early CAD 03/23/2016  . SVT (supraventricular tachycardia) ([place]) 03/14/2016  . Adrenal nodule ([place]) 01/06/2016  . Supraumbilical hernia without gangrene and without obstruction 12/02/2015  . Morbid obesity ([place])  04/09/2015  . Vitamin D deficiency   . Vitamin B12 deficiency   . Idiopathic intracranial hypertension 05/24/2013  . Anxiety   . GERD 01/28/2009  . POLYCYSTIC OVARIAN DISEASE 02/10/2007  . Class 3 severe obesity with serious comorbidity and body mass index (BMI) of 50.0 to 59.9 in adult ([place]) 02/10/2007  . Obstructive sleep apnea 02/10/2007  . Cough variant asthma 02/10/2007    Current Outpatient Medications on File Prior to Visit  Medication Sig Dispense Refill  . ALPRAZolam (XANAX) 0.5 MG tablet Take 1 tablet (0.5 mg total) by mouth 3 (three) times daily as needed for anxiety. 60 tablet 0  . Cyanocobalamin (B-12 COMPLIANCE INJECTION) 1000 MCG/ML KIT B12  Monthly    . loratadine (CLARITIN) 10 MG tablet Take 10 mg by mouth in the morning.    [name] losartan (COZAAR) 25 MG tablet Take 1 tablet (25 mg total) by mouth daily. 30 tablet 0  . metoprolol tartrate (LOPRESSOR) 25 MG tablet Take 0.5 tablets (12.5 mg total) by mouth 2 (two) times daily. 90 tablet 3  . ondansetron (ZOFRAN-ODT) 4 MG disintegrating tablet Take 1 tablet (4 mg total) by mouth every 8 (eight) hours as needed for nausea or vomiting. 30 tablet 0  . pantoprazole (PROTONIX) 40 MG tablet TAKE 1 TABLET BY MOUTH EVERY DAY (Patient taking differently: Take 40 mg by mouth daily before breakfast. ) 90 tablet 3  . PARoxetine (PAXIL) 10 MG tablet Take 1 tablet (10 mg  total) by mouth daily. 30 tablet 0  . PRESCRIPTION MEDICATION CPAP- At bedtime and during any time of rest    . topiramate (TOPAMAX) 50 MG tablet Take 1 tablet (50 mg total) by mouth daily. 30 tablet 0  . valACYclovir (VALTREX) 1000 MG tablet TAKE 2 TABLETS EVERY 12 HOURS FOR 1 DAY START ASAP AFTER SYTPTOM ONSET (Patient taking differently: Take 2,000 mg by mouth See admin instructions. Take 2,000 mg by mouth every 12 hours for 1 day, starting ASAP, as directed, after onset of symptom(s)) 12 tablet 3   No current facility-administered medications on file prior to visit.     Past Medical History:  Diagnosis Date  . Anxiety   . ASTHMA   . B12 deficiency   . DELAYED GASTRIC EMPTYING   . DEPRESSION   . GERD   . Glucose intolerance (impaired glucose tolerance) 04/09/2015  . Hypertension   . OBESITY   . OBSTRUCTIVE SLEEP APNEA   . POLYCYSTIC OVARIAN DISEASE   . Prediabetes   . SMOKER   . SOB (shortness of breath)   . SVT (supraventricular tachycardia) ([place])   . Tachycardia   . Umbilical hernia   . Umbilical hernia   . Vitamin B12 deficiency 03/2015 dx   start IM replacement q 30d  . Vitamin D deficiency 03/2015 dx    Past Surgical History:  Procedure Laterality Date  . CESAREAN SECTION  97 & 98   x's 2  . Cocxyl removal  1995  . DILATION AND CURETTAGE OF UTERUS    . LAPAROSCOPY    . TUBAL LIGATION  1998    Social History   Socioeconomic History  . Marital status: Married    Spouse name: Fradel Baldonado  . Number of children: 2  . Years of education: Not on file  . Highest education level: Not on file  Occupational History  . 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 Smoking status: Former Smoker    Packs/day: 0.25    Years: 30.00    Pack years: 7.50    Types: Cigarettes    Start date: 10/23/1987    Quit date: 10/23/2017    Years since quitting: 2.1  . Smokeless tobacco: Never Used  Vaping Use  . Vaping Use: Never used  Substance and Sexual Activity  . Alcohol use: Yes    Alcohol/week: 0.0 standard drinks    Comment: Rarely  . Drug use: No  . Sexual activity: Not on file  Other Topics Concern  . Not on file  Social History Narrative   Lives with husband & 2 kids from 1st marriage. (1st husband expired 1998-leukemia) work as Merchandiser, retail, but currently unemployed   Social Determinants of Radio broadcast assistant Strain:   . Difficulty of Paying Living Expenses: Not on file  Food Insecurity:   . Worried About Charity fundraiser in the Last Year: Not on file  . Ran  Out of Food in the Last Year: Not on file  Transportation Needs:   . Lack of Transportation (Medical): Not on file  . Lack of Transportation (Non-Medical): Not on file  Physical Activity:   . Days of Exercise per Week: Not on file  . Minutes of Exercise per Session: Not on file  Stress:   . Feeling of Stress : Not on file  Social Connections:   . Frequency of Communication with Friends and Family:  Not on file  . Frequency of Social Gatherings with Friends and Family: Not on file  . Attends Religious Services: Not on file  . Active Member of Clubs or Organizations: Not on file  . Attends Archivist Meetings: Not on file  . Marital Status: Not on file    Family History  Problem Relation Age of Onset  . Breast cancer Mother        dx 11/2009  . Diabetes Mother   . High blood pressure Mother   . Thyroid disease Mother   . Anxiety disorder Mother   . Obesity Mother   . Diabetes Maternal Aunt   . Prostate cancer Maternal Grandfather 66       also hx colon polyps  . Heart disease Paternal Grandmother   . Pancreatic cancer Paternal Grandmother 62  . High blood pressure Father   . High Cholesterol Father   . Depression Father   . Heart murmur Sister   . Pancreatic cancer Maternal Grandmother   . Colon polyps Maternal Uncle   . Adrenal disorder Neg Hx     Review of Systems  Constitutional: Negative for fever.  Respiratory: Positive for shortness of breath (chronic - no change).   Cardiovascular: Positive for chest pain (chronic ) and palpitations (chronic ).  Gastrointestinal: Positive for abdominal pain (lower abdomen), nausea and vomiting. Negative for constipation and diarrhea.  Neurological: Positive for light-headedness and headaches.       Objective:   Vitals:   12/20/19 1427  BP: 130/78  Pulse: 78  Temp: 97.7 F (36.5 C)  SpO2: 96%   BP Readings from Last 3 Encounters:  12/20/19 130/78  12/12/19 (!) 142/76  12/11/19 131/76   Wt Readings from Last  3 Encounters:  12/20/19 (!) 345 lb (156.5 kg)  12/12/19 (!) 347 lb (157.4 kg)  12/11/19 (!) 345 lb (156.5 kg)   Body mass index is 57.41 kg/m.   Physical Exam    Constitutional: Appears well-developed and well-nourished. No distress.  HENT:  Head: Normocephalic and atraumatic.  Neck: Neck supple. No tracheal deviation present. No thyromegaly present.  No cervical lymphadenopathy Cardiovascular: Normal rate, regular rhythm and normal heart sounds.   No murmur heard. No carotid bruit .  No edema Pulmonary/Chest: Effort normal and breath sounds normal. No respiratory distress. No has no wheezes. No rales.  Skin: Skin is warm and dry. Not diaphoretic.  Psychiatric: anxious mood and affect. Behavior is normal.      Assessment & Plan:    See Problem List for Assessment and Plan of chronic medical problems.    This visit occurred during the SARS-CoV-2 public health emergency.  Safety protocols were in place, including screening questions prior to the visit, additional usage of staff PPE, and extensive cleaning of exam room while observing appropriate contact time as indicated for disinfecting solutions.

## 2019-12-19 NOTE — Patient Instructions (Addendum)
  Medications reviewed and updated.  Changes include :   Start pepcid at night or bedtime.    Try saline nasal spray. If that does not help try flonase.   Your prescription(s) have been submitted to your pharmacy. Please take as directed and contact our office if you believe you are having problem(s) with the medication(s).   Please followup in 6 months

## 2019-12-20 ENCOUNTER — Encounter: Payer: Self-pay | Admitting: Internal Medicine

## 2019-12-20 ENCOUNTER — Ambulatory Visit (INDEPENDENT_AMBULATORY_CARE_PROVIDER_SITE_OTHER): Payer: BC Managed Care – PPO | Admitting: Psychology

## 2019-12-20 ENCOUNTER — Other Ambulatory Visit: Payer: Self-pay

## 2019-12-20 ENCOUNTER — Ambulatory Visit: Payer: BC Managed Care – PPO | Admitting: Internal Medicine

## 2019-12-20 VITALS — BP 130/78 | HR 78 | Temp 97.7°F | Wt 345.0 lb

## 2019-12-20 DIAGNOSIS — I700 Atherosclerosis of aorta: Secondary | ICD-10-CM | POA: Diagnosis not present

## 2019-12-20 DIAGNOSIS — E538 Deficiency of other specified B group vitamins: Secondary | ICD-10-CM | POA: Diagnosis not present

## 2019-12-20 DIAGNOSIS — F419 Anxiety disorder, unspecified: Secondary | ICD-10-CM

## 2019-12-20 DIAGNOSIS — I10 Essential (primary) hypertension: Secondary | ICD-10-CM | POA: Diagnosis not present

## 2019-12-20 DIAGNOSIS — K219 Gastro-esophageal reflux disease without esophagitis: Secondary | ICD-10-CM | POA: Diagnosis not present

## 2019-12-20 DIAGNOSIS — R0981 Nasal congestion: Secondary | ICD-10-CM | POA: Insufficient documentation

## 2019-12-20 DIAGNOSIS — F411 Generalized anxiety disorder: Secondary | ICD-10-CM

## 2019-12-20 MED ORDER — ALBUTEROL SULFATE HFA 108 (90 BASE) MCG/ACT IN AERS: 2.0000 | INHALATION_SPRAY | Freq: Four times a day (QID) | RESPIRATORY_TRACT | 0 refills | Status: DC | PRN

## 2019-12-20 MED ORDER — ALBUTEROL SULFATE HFA 108 (90 BASE) MCG/ACT IN AERS: 2.0000 | INHALATION_SPRAY | Freq: Four times a day (QID) | RESPIRATORY_TRACT | 8 refills | Status: AC | PRN

## 2019-12-20 MED ORDER — FAMOTIDINE 40 MG PO TABS
40.0000 mg | ORAL_TABLET | Freq: Every day | ORAL | 5 refills | Status: DC
Start: 2019-12-20 — End: 2020-01-07

## 2019-12-20 MED ORDER — CYANOCOBALAMIN 1000 MCG/ML IJ SOLN
1000.0000 ug | Freq: Once | INTRAMUSCULAR | Status: AC
  Administered 2019-12-20: 1000 ug via INTRAMUSCULAR

## 2019-12-20 NOTE — Assessment & Plan Note (Signed)
Acute Will try saline nasal spray at night If not effective - can try flonase ? Contributing to nausea/ GERD

## 2019-12-20 NOTE — Assessment & Plan Note (Signed)
chronic Monthly B12 injection - one today

## 2019-12-20 NOTE — Assessment & Plan Note (Addendum)
Chronic Still having N/V in morning - ? Uncontrolled GERD continue protonix Start pecpd at night

## 2019-12-20 NOTE — Assessment & Plan Note (Signed)
Chronic Back on paxil 10 mg daily for past week Anxiety should improve with being on paxil at current dose longer - may need to increase paxil further Xanax prn

## 2019-12-20 NOTE — Assessment & Plan Note (Signed)
Acute - seen on Ct  Likely mild At this point would just recommend lifestyle changes Hold of on statin

## 2019-12-20 NOTE — Assessment & Plan Note (Signed)
Chronic BP well controlled Current regimen effective and well tolerated Continue current medications at current doses

## 2019-12-24 ENCOUNTER — Telehealth: Payer: Self-pay | Admitting: Interventional Cardiology

## 2019-12-24 NOTE — Telephone Encounter (Signed)
STAT if HR is under 50 or over 120 (normal HR is 60-100 beats per minute)  1) What is your heart rate? Ranging in the 60's and 50's, dropped down to the 40's yesterday current HR 72   2) Do you have a log of your heart rate readings (document readings)? Current HR 72  3) Do you have any other symptoms? Dizziness and chest pressure(has been occurring since she received vaccine)   [name] is calling stating she has been experiencing bradycardia. She states she has recently lost weight and stopped smoking and doesn't know if her medication dosage could be to high now due to this. Her HR yesterday was ranging in the 60's-50's and at one point dropped down to the 40's. Due to this [name] did not take her metoprolol last night and has also not taken it this morning. She states she has symptoms of dizziness when the bradycardia occurs. She also reports she has been having chest pressure since she was vaccinated, but states the office is already aware of this. Please advise.

## 2019-12-24 NOTE — Telephone Encounter (Signed)
Called and spoke to the patient. She states that for the past couple of days she has had some lower HRs. She states that it has been in the high 50s and 60s. She states that she did have one reading with HR in the 40s on her apple watch, however, his was during the nighttime while she was sleeping. Patient's current BP 130/77 and HR 72. She states that she has had some dizziness with position changes. She is currently taking metoprolol 12.5 mg BID. She states that she has lost 23 lbs and is wondering if her metoprolol dose should be decreased. Made patient aware that I will forward to Katina Dung, NP who saw the patient last for review and recommendation.

## 2019-12-26 ENCOUNTER — Ambulatory Visit (INDEPENDENT_AMBULATORY_CARE_PROVIDER_SITE_OTHER): Payer: BC Managed Care – PPO | Admitting: Psychology

## 2019-12-26 DIAGNOSIS — F411 Generalized anxiety disorder: Secondary | ICD-10-CM

## 2019-12-26 NOTE — Telephone Encounter (Signed)
Patient notified.  She will stay hydrated and change positions slowly.  She would like to see Dr Irish Lack in [place] and has appointment on October 7.

## 2019-12-26 NOTE — Telephone Encounter (Signed)
See previous note. Wants [place] to give her a phone call.

## 2019-12-26 NOTE — Telephone Encounter (Signed)
I spoke with patient.  She is calling to follow up to see if any medicine changes need to be made.  BP was 125/72 this morning prior to AM medications.  Heart rate in the 60's most of the time.

## 2019-12-26 NOTE — Telephone Encounter (Signed)
As long as she is asymptomatic with these vital signs that is great.

## 2019-12-26 NOTE — Progress Notes (Signed)
Reviewed and agree with documentation and assessment and plan. K. Veena Ticara Waner , MD   

## 2019-12-31 ENCOUNTER — Other Ambulatory Visit: Payer: Self-pay

## 2019-12-31 ENCOUNTER — Telehealth: Payer: Self-pay | Admitting: Nurse Practitioner

## 2019-12-31 DIAGNOSIS — R197 Diarrhea, unspecified: Secondary | ICD-10-CM

## 2019-12-31 DIAGNOSIS — R109 Unspecified abdominal pain: Secondary | ICD-10-CM

## 2019-12-31 NOTE — Telephone Encounter (Signed)
Spoke with the patient and she stated that she did receive the my chart message regarding her CT scan. Patient stated that she is still having diarrhea and would like to have the stool test Merrill suggested. Orders placed in epic by [facility], patient will go to [facility] to pick up testing for a GI profile stool, PCR.  Patient stated that her nausea has subsided and she is able to eat solid foods at this time.

## 2019-12-31 NOTE — Telephone Encounter (Signed)
-----   Message from Noralyn Pick, NP sent at 12/31/2019  8:34 AM EDT ----- [name], pls call the patient and let her know my msg as follows: Perhaps this msg did not go through her my chart?  Dear [name], your abdominal/pelvic CT scan showed some fluid in the colon suggestive of diarrhea. If you are still having diarrhea I would recommend checking stool cultures.   Your hernia above your belly button is stable which contains adipose tissue. If you have not been seen by a general surgeon for this hernia I would recommend for you to do so. Let me know if you want me to enter a referral.   You have a fatty liver, weight loss is recommended and repeat your liver enzyme blood tests with your primary doctor in 6 months.   You have a left adrenal gland adenoma and a stable lesion in the left sacral area, please follow up with your primary physician regarding these finding.  The above results do not explain your nausea and vomiting. I would recommend scheduling an upper endoscopy if you continue to have nausea and vomiting. Please let me know if you would like to schedule the upper endoscopy at this time or if you prefer to schedule a follow up appointment with Dr. Silverio Decamp to further discuss. Call our office if your symptoms worsen.   Sincerely, [name] CRNP ----- Message ----- From: Letta Pate, CMA Sent: 12/31/2019   8:30 AM EDT To: Noralyn Pick, NP  I am not certain if [name] has received her results from [place] regarding her CT scan on 12/18/2019

## 2019-12-31 NOTE — Telephone Encounter (Signed)
Pt is requesting a call back from a nurse to discuss the stool test [facility] requested for her to have,

## 2020-01-02 ENCOUNTER — Other Ambulatory Visit: Payer: BC Managed Care – PPO

## 2020-01-02 DIAGNOSIS — R197 Diarrhea, unspecified: Secondary | ICD-10-CM

## 2020-01-02 DIAGNOSIS — R109 Unspecified abdominal pain: Secondary | ICD-10-CM

## 2020-01-03 ENCOUNTER — Ambulatory Visit: Payer: BC Managed Care – PPO | Admitting: Obstetrics and Gynecology

## 2020-01-07 ENCOUNTER — Ambulatory Visit (INDEPENDENT_AMBULATORY_CARE_PROVIDER_SITE_OTHER): Payer: BC Managed Care – PPO | Admitting: Family Medicine

## 2020-01-07 ENCOUNTER — Encounter (INDEPENDENT_AMBULATORY_CARE_PROVIDER_SITE_OTHER): Payer: Self-pay | Admitting: Family Medicine

## 2020-01-07 ENCOUNTER — Other Ambulatory Visit: Payer: Self-pay

## 2020-01-07 VITALS — BP 118/73 | HR 66 | Temp 97.9°F | Ht 65.0 in | Wt 346.0 lb

## 2020-01-07 DIAGNOSIS — E559 Vitamin D deficiency, unspecified: Secondary | ICD-10-CM | POA: Diagnosis not present

## 2020-01-07 DIAGNOSIS — Z9189 Other specified personal risk factors, not elsewhere classified: Secondary | ICD-10-CM

## 2020-01-07 DIAGNOSIS — F411 Generalized anxiety disorder: Secondary | ICD-10-CM

## 2020-01-07 DIAGNOSIS — I10 Essential (primary) hypertension: Secondary | ICD-10-CM | POA: Diagnosis not present

## 2020-01-07 DIAGNOSIS — Z6841 Body Mass Index (BMI) 40.0 and over, adult: Secondary | ICD-10-CM

## 2020-01-07 MED ORDER — PAROXETINE HCL 10 MG PO TABS: 10.0000 mg | ORAL_TABLET | Freq: Every day | ORAL | 0 refills | Status: DC

## 2020-01-07 MED ORDER — VITAMIN D (ERGOCALCIFEROL) 1.25 MG (50000 UNIT) PO CAPS: 50000.0000 [IU] | ORAL_CAPSULE | ORAL | 0 refills | Status: DC

## 2020-01-07 MED ORDER — LOSARTAN POTASSIUM 25 MG PO TABS: 25.0000 mg | ORAL_TABLET | Freq: Every day | ORAL | 0 refills | Status: DC

## 2020-01-07 MED ORDER — TOPIRAMATE 25 MG PO TABS: 25.0000 mg | ORAL_TABLET | Freq: Every day | ORAL | 0 refills | Status: DC

## 2020-01-07 NOTE — Progress Notes (Signed)
Chief Complaint:   OBESITY [name] is here to discuss her progress with her obesity treatment plan along with follow-up of her obesity related diagnoses. [name] is on practicing portion control and making smarter food choices, such as increasing vegetables and decreasing simple carbohydrates and states she is following her eating plan approximately 0% of the time. [name] states she is walking 4-5 miles.   Today's visit was #: 38 Starting weight: 368 lbs Starting date: 02/14/2018 Today's weight: 346 lbs Today's date: 01/07/2020 Total lbs lost to date: 22 Total lbs lost since last in-office visit: 0  Interim History: [name] notes her anxiety is much better and she is now able to tolerate eating. She lost almost 20 lbs from July 8 to Sept. 1 secondary to anorexia from severe anxiety. Her weight is stable today. She notes she is sensitive to sodium intake and would like to limit this. She notes that increased sodium intake causes her to have edema. She is not yet eating 3 meals per day but is working on eating more regularly.  Subjective:   1. GAD (generalized anxiety disorder) [name]'s recent CT scan showed small renal calculus. She is on Topamax for emotional eating. We will taper her off of this. She notes anxiety has improved. We increased Paxil back to 10 mg daily and she feels this has really helped stabilize her overall mood.  She is seeing a counselor regularly. I did refer her to psychiatry for med management but she is not sure she needs this at this time. She has the contact information should she decide to set an appointment. I do feel this would be beneficial for her given the severity of her recent anxiety.   2. Vitamin D deficiency [name]'s Vit D level is at goal at 53.6. She is on prescription Vit D.   3. Essential hypertension [name]'s blood pressure is well controlled on metoprolol (prescribed for SVT), and losartan. She does note recent orthostatic symptoms and her pulse is in the 50's  at times.  4. At risk for deficient intake of food The patient is at a higher than average risk of deficient intake of food due to lack of appetite and protein deficiency.  Assessment/Plan:   1. GAD (generalized anxiety disorder)  [name] agreed to decrease Topamax to 25 mg daily, and we will refill for 1 month. I will discontinue at next OV due to recent finding of nephrolithiasis. . She does better with a slow taper of any medication due to her anxiety issues.  She reports that neuro said the Topamax caused an improvement in in her pseudotumor cerebri. I will leave this to neuro to decide if they would like to restart the Topamax.  I will refill Paxil for 1 month. Though the Paxil is possibly a small factor in her weight, the benefits of her continuing it far outweigh the risks. She will continue Xanax as is. She does not take the Xanax much.  - PARoxetine (PAXIL) 10 MG tablet; Take 1 tablet (10 mg total) by mouth daily.  Dispense: 30 tablet; Refill: 0 - topiramate (TOPAMAX) 25 MG tablet; Take 1 tablet (25 mg total) by mouth daily.  Dispense: 30 tablet; Refill: 0  2. Vitamin D deficiency Refill prescription Vitamin D for 1 month.   - Vitamin D, Ergocalciferol, (DRISDOL) 1.25 MG (50000 UNIT) CAPS capsule; Take 1 capsule (50,000 Units total) by mouth every 7 (seven) days.  Dispense: 4 capsule; Refill: 0  3. Essential hypertension Refill losartan for  1 month. [name] will follow up with Cardiology (appointment on 01/16/2020) and discuss her dizziness and low pulse rate.  - losartan (COZAAR) 25 MG tablet; Take 1 tablet (25 mg total) by mouth daily.  Dispense: 30 tablet; Refill: 0  4. At risk for deficient intake of food [name] was given approximately 15 minutes of deficit intake of food prevention counseling today. [name] is at risk for eating too few calories based on current food recall. She was encouraged to focus on meeting caloric and protein goals according to her recommended meal plan.   5.  Class 3 severe obesity with serious comorbidity and body mass index (BMI) of 50.0 to 59.9 in adult, unspecified obesity type ([place]) [name] is currently in the action stage of change. As such, her goal is to continue with weight loss efforts. She has agreed to practicing portion control and making smarter food choices, such as increasing vegetables and decreasing simple carbohydrates.   [name] is to limit her sodium to 1500 mg daily, and she may have a protein shake in the morning.  Exercise goals: As is.  Behavioral modification strategies: increasing lean protein intake, decreasing sodium intake and no skipping meals.  [name] has agreed to follow-up with our clinic in 4 weeks.   Objective:   Blood pressure 118/73, pulse 66, temperature 97.9 F (36.6 C), temperature source Oral, height 5' 5"  (1.651 m), weight (!) 346 lb (156.9 kg), SpO2 98 %. Body mass index is 57.58 kg/m.  General: Cooperative, alert, well developed, in no acute distress. HEENT: Conjunctivae and lids unremarkable. Cardiovascular: Regular rhythm.  Lungs: Normal work of breathing. Neurologic: No focal deficits.   Lab Results  Component Value Date   CREATININE 0.97 11/27/2019   BUN 11 11/27/2019   NA 138 11/27/2019   K 4.3 11/27/2019   CL 104 11/27/2019   CO2 21 11/27/2019   Lab Results  Component Value Date   ALT 20 11/27/2019   AST 8 (L) 11/27/2019   ALKPHOS 75 11/16/2019   BILITOT 0.4 11/27/2019   Lab Results  Component Value Date   HGBA1C 5.4 12/11/2019   HGBA1C 6.0 06/11/2019   HGBA1C 5.8 (H) 03/12/2019   HGBA1C 5.8 (H) 09/28/2018   HGBA1C 5.7 (H) 02/14/2018   Lab Results  Component Value Date   INSULIN 38.8 (H) 12/11/2019   INSULIN 38.0 (H) 07/19/2019   INSULIN 27.7 (H) 03/12/2019   INSULIN 2.0 (L) 09/28/2018   INSULIN 19.1 02/14/2018   Lab Results  Component Value Date   TSH 2.140 07/19/2019   Lab Results  Component Value Date   CHOL 185 12/11/2019   HDL 36 (L) 12/11/2019   LDLCALC 126  (H) 12/11/2019   LDLDIRECT 154.1 08/16/2012   TRIG 127 12/11/2019   CHOLHDL 5 06/11/2019   Lab Results  Component Value Date   WBC 8.1 12/11/2019   HGB 11.7 12/11/2019   HCT 35.7 12/11/2019   MCV 88 12/11/2019   PLT 353 12/11/2019   No results found for: IRON, TIBC, FERRITIN  Attestation Statements:   Reviewed by clinician on day of visit: allergies, medications, problem list, medical history, surgical history, family history, social history, and previous encounter notes.   Wilhemena Durie, am acting as Location manager for [employer], FNP-C.  I have reviewed the above documentation for accuracy and completeness, and I agree with the above. -  Georgianne Fick, FNP

## 2020-01-08 ENCOUNTER — Ambulatory Visit (INDEPENDENT_AMBULATORY_CARE_PROVIDER_SITE_OTHER): Payer: BC Managed Care – PPO | Admitting: Psychology

## 2020-01-08 DIAGNOSIS — F411 Generalized anxiety disorder: Secondary | ICD-10-CM | POA: Diagnosis not present

## 2020-01-15 NOTE — Progress Notes (Signed)
Cardiology Office Note   Date:  01/16/2020   ID:  [name], DOB 1970/05/02, MRN [id number]  PCP:  Binnie Rail, MD    No chief complaint on file.  SVT  Wt Readings from Last 3 Encounters:  01/16/20 (!) 347 lb 12.8 oz (157.8 kg)  01/07/20 (!) 346 lb (156.9 kg)  12/20/19 (!) 345 lb (156.5 kg)       History of Present Illness: [name] is a 49 y.o. female  Who saw Dr. Bronson Ing in the past.  Prior records show: "morbid obesity, severe sleep apnea, hypertension, paroxysmal SVT, chest pain, and anxiety with panic disorder, shortness of breath.  Cardiac CT was normal in March 2020.  Event monitoring demonstrated sinus rhythm with paroxysmal nonsustained ventricular tachycardia with a 6 beat and 10 beat run noted, 1 of which was symptomatic.  Most symptoms corresponded with sinus rhythm.  I initially recommended she try Lopressor 25 mg bid. She tried this but it made her dizzy.  CBC, sodium, potassium, and renal function were normal on 09/12/2018.  She has had HR fluctuations and has episodic mild dizziness. She has exertional dyspnea when walking uphill. She denies chest pain." (All per Dr. Bronson Ing)  Echo in 2015 showed normal LV function and normal valvular function.   She called in to the office on September 14 with the following message: "She states that for the past couple of days she has had some lower HRs. She states that it has been in the high 50s and 60s. She states that she did have one reading with HR in the 40s on her apple watch, however, his was during the nighttime while she was sleeping. Patient's current BP 130/77 and HR 72. She states that she has had some dizziness with position changes. She is currently taking metoprolol 12.5 mg BID. She states that she has lost 23 lbs and is wondering if her metoprolol dose should be decreased."  Her metoprolol was continued.  She was instructed to stay hydrated and change positions  slowly.  Dizzines has improved.  She reoprts stopping smoking and losing weight.  SHe is walking 1 mile 5 days/week.  She feels well while walking.  Her phone app tells her that her HR goes from 39 when asleep typically.    Denies : Chest pain. Dizziness. Leg edema. Nitroglycerin use. Orthopnea. Palpitations. Paroxysmal nocturnal dyspnea. Shortness of breath. Syncope.     Past Medical History:  Diagnosis Date  . Anxiety   . ASTHMA   . B12 deficiency   . DELAYED GASTRIC EMPTYING   . DEPRESSION   . GERD   . Glucose intolerance (impaired glucose tolerance) 04/09/2015  . Hypertension   . OBESITY   . OBSTRUCTIVE SLEEP APNEA   . POLYCYSTIC OVARIAN DISEASE   . Prediabetes   . SMOKER   . SOB (shortness of breath)   . SVT (supraventricular tachycardia) ([place])   . Tachycardia   . Umbilical hernia   . Umbilical hernia   . Vitamin B12 deficiency 03/2015 dx   start IM replacement q 30d  . Vitamin D deficiency 03/2015 dx    Past Surgical History:  Procedure Laterality Date  . CESAREAN SECTION  97 & 98   x's 2  . Cocxyl removal  1995  . DILATION AND CURETTAGE OF UTERUS    . LAPAROSCOPY    . TUBAL LIGATION  1998     Current Outpatient Medications  Medication Sig Dispense Refill  . albuterol (  VENTOLIN HFA) 108 (90 Base) MCG/ACT inhaler Inhale 2 puffs into the lungs every 6 (six) hours as needed for wheezing or shortness of breath. 18 g 8  . ALPRAZolam (XANAX) 0.5 MG tablet Take 1 tablet (0.5 mg total) by mouth 3 (three) times daily as needed for anxiety. 60 tablet 0  . Cyanocobalamin (B-12 COMPLIANCE INJECTION) 1000 MCG/ML KIT B12  Monthly    . loratadine (CLARITIN) 10 MG tablet Take 10 mg by mouth in the morning.    [name] losartan (COZAAR) 25 MG tablet Take 1 tablet (25 mg total) by mouth daily. 30 tablet 0  . metoprolol tartrate (LOPRESSOR) 25 MG tablet Take 0.5 tablets (12.5 mg total) by mouth 2 (two) times daily. 90 tablet 3  . pantoprazole (PROTONIX) 40 MG tablet TAKE 1 TABLET BY  MOUTH EVERY DAY 90 tablet 3  . PARoxetine (PAXIL) 10 MG tablet Take 1 tablet (10 mg total) by mouth daily. 30 tablet 0  . PRESCRIPTION MEDICATION CPAP- At bedtime and during any time of rest    . topiramate (TOPAMAX) 25 MG tablet Take 1 tablet (25 mg total) by mouth daily. 30 tablet 0  . valACYclovir (VALTREX) 1000 MG tablet TAKE 2 TABLETS EVERY 12 HOURS FOR 1 DAY START ASAP AFTER SYTPTOM ONSET 12 tablet 3  . Vitamin D, Ergocalciferol, (DRISDOL) 1.25 MG (50000 UNIT) CAPS capsule Take 1 capsule (50,000 Units total) by mouth every 7 (seven) days. 4 capsule 0   No current facility-administered medications for this visit.    Allergies:   Doxycycline and Tape    Social History:  The patient  reports that she quit smoking about 2 years ago. Her smoking use included cigarettes. She started smoking about 32 years ago. She has a 7.50 pack-year smoking history. She has never used smokeless tobacco. She reports current alcohol use. She reports that she does not use drugs.   Family History:  The patient's family history includes Anxiety disorder in her mother; Breast cancer in her mother; Colon polyps in her maternal uncle; Depression in her father; Diabetes in her maternal aunt and mother; Heart disease in her paternal grandmother; Heart murmur in her sister; High Cholesterol in her father; High blood pressure in her father and mother; Obesity in her mother; Pancreatic cancer in her maternal grandmother; Pancreatic cancer (age of onset: 17) in her paternal grandmother; Prostate cancer (age of onset: 53) in her maternal grandfather; Thyroid disease in her mother.    ROS:  Please see the history of present illness.   Otherwise, review of systems are positive for anxiety.   All other systems are reviewed and negative.    PHYSICAL EXAM: VS:  BP 120/70   Pulse 67   Ht 5' 5"  (1.651 m)   Wt (!) 347 lb 12.8 oz (157.8 kg)   SpO2 98%   BMI 57.88 kg/m  , BMI Body mass index is 57.88 kg/m. GEN: Well  nourished, well developed, in no acute distress  HEENT: normal  Neck: no JVD, carotid bruits, or masses Cardiac: RRR; no murmurs, rubs, or gallops,no edema  Respiratory:  clear to auscultation bilaterally, normal work of breathing GI: soft, nontender, nondistended, + BS MS: no deformity or atrophy  Skin: warm and dry, no rash Neuro:  Strength and sensation are intact Psych: euthymic mood, full affect   EKG:   The ekg ordered 11/24/19 demonstrates normal sinus rhythm, no ST changes   Recent Labs: 07/19/2019: TSH 2.140 11/27/2019: ALT 20; BUN 11; Creat 0.97; Potassium 4.3;  Sodium 138 12/11/2019: Hemoglobin 11.7; Platelets 353   Lipid Panel    Component Value Date/Time   CHOL 185 12/11/2019 0755   TRIG 127 12/11/2019 0755   HDL 36 (L) 12/11/2019 0755   CHOLHDL 5 06/11/2019 0732   VLDL 28.8 06/11/2019 0732   LDLCALC 126 (H) 12/11/2019 0755   LDLDIRECT 154.1 08/16/2012 0828     Other studies Reviewed: Additional studies/ records that were reviewed today with results demonstrating: prior ECG.   ASSESSMENT AND PLAN:    1. NSVT:  Had a trip to the ER in the past.  Sounds like she received adenosine at that time. Continue low dose metoprolol.  No sx of SVT. 2. HTN: The current medical regimen is effective;  continue present plan and medications.  If BP gets too low, could reduce losartan dose.  3. OSA: Using CPAP.  4. Morbid obesity: Working on losing weight.  Now off metformin.  Going to healthy wellness for weight loss. She has recently lost 20 lbs.    Current medicines are reviewed at length with the patient today.  The patient concerns regarding her medicines were addressed.  The following changes have been made:  No change  Labs/ tests ordered today include:  No orders of the defined types were placed in this encounter.   Recommend 150 minutes/week of aerobic exercise Low fat, low carb, high fiber diet recommended  Disposition:   FU in 1 year   Signed, Larae Grooms, MD  01/16/2020 1:28 PM    [facility] [address], [place], [place]  [address] Phone: [phone]; Fax: [phone]

## 2020-01-16 ENCOUNTER — Ambulatory Visit: Payer: BC Managed Care – PPO | Admitting: Interventional Cardiology

## 2020-01-16 ENCOUNTER — Other Ambulatory Visit: Payer: Self-pay

## 2020-01-16 ENCOUNTER — Encounter: Payer: Self-pay | Admitting: Interventional Cardiology

## 2020-01-16 VITALS — BP 120/70 | HR 67 | Ht 65.0 in | Wt 347.8 lb

## 2020-01-16 DIAGNOSIS — G4730 Sleep apnea, unspecified: Secondary | ICD-10-CM

## 2020-01-16 DIAGNOSIS — I472 Ventricular tachycardia: Secondary | ICD-10-CM

## 2020-01-16 DIAGNOSIS — I4729 Other ventricular tachycardia: Secondary | ICD-10-CM

## 2020-01-16 DIAGNOSIS — I10 Essential (primary) hypertension: Secondary | ICD-10-CM | POA: Diagnosis not present

## 2020-01-16 NOTE — Patient Instructions (Signed)
Medication Instructions:  Your physician recommends that you continue on your current medications as directed. Please refer to the Current Medication list given to you today.  *If you need a refill on your cardiac medications before your next appointment, please call your pharmacy*   Lab Work: None  If you have labs (blood work) drawn today and your tests are completely normal, you will receive your results only by: [name] MyChart Message (if you have MyChart) OR . A paper copy in the mail If you have any lab test that is abnormal or we need to change your treatment, we will call you to review the results.   Testing/Procedures: None   Follow-Up: At [facility], you and your health needs are our priority.  As part of our continuing mission to provide you with exceptional heart care, we have created designated Provider Care Teams.  These Care Teams include your primary Cardiologist (physician) and Advanced Practice Providers (APPs -  Physician Assistants and Nurse Practitioners) who all work together to provide you with the care you need, when you need it.  We recommend signing up for the patient portal called "MyChart".  Sign up information is provided on this After Visit Summary.  MyChart is used to connect with patients for Virtual Visits (Telemedicine).  Patients are able to view lab/test results, encounter notes, upcoming appointments, etc.  Non-urgent messages can be sent to your provider as well.   To learn more about what you can do with MyChart, go to [email or web address].    Your next appointment:   12 month(s)  The format for your next appointment:   In Person  Provider:   You may see Casandra Doffing, MD or one of the following Advanced Practice Providers on your designated Care Team:    Melina Copa, PA-C  Ermalinda Barrios, PA-C    Other Instructions None

## 2020-01-17 ENCOUNTER — Ambulatory Visit: Payer: BC Managed Care – PPO | Admitting: Pulmonary Disease

## 2020-01-17 ENCOUNTER — Encounter: Payer: Self-pay | Admitting: Pulmonary Disease

## 2020-01-17 VITALS — BP 114/70 | HR 59 | Temp 97.6°F | Ht 65.0 in | Wt 347.8 lb

## 2020-01-17 DIAGNOSIS — E669 Obesity, unspecified: Secondary | ICD-10-CM

## 2020-01-17 DIAGNOSIS — R918 Other nonspecific abnormal finding of lung field: Secondary | ICD-10-CM | POA: Diagnosis not present

## 2020-01-17 DIAGNOSIS — G4730 Sleep apnea, unspecified: Secondary | ICD-10-CM

## 2020-01-17 DIAGNOSIS — Z9989 Dependence on other enabling machines and devices: Secondary | ICD-10-CM

## 2020-01-17 DIAGNOSIS — Z7189 Other specified counseling: Secondary | ICD-10-CM | POA: Diagnosis not present

## 2020-01-17 DIAGNOSIS — G4733 Obstructive sleep apnea (adult) (pediatric): Secondary | ICD-10-CM | POA: Diagnosis not present

## 2020-01-17 DIAGNOSIS — J4530 Mild persistent asthma, uncomplicated: Secondary | ICD-10-CM

## 2020-01-17 NOTE — Patient Instructions (Signed)
Will arrange for CT chest and call you with results  Will have [employer] refit your CPAP mask  Follow up in 1 year

## 2020-01-17 NOTE — Progress Notes (Signed)
[facility]  Chief Complaint  Patient presents with  . Follow-up    Doing well with CPAP- has some irration accross the bridge of her nose from mask.     Constitutional:  BP 114/70 (BP Location: Left Wrist, Cuff Size: Normal)   Pulse (!) 59   Temp 97.6 F (36.4 C) (Temporal)   Ht 5' 5"  (1.651 m)   Wt (!) 347 lb 12.8 oz (157.8 kg)   SpO2 96% Comment: on RA  BMI 57.88 kg/m   Past Medical History:  Anxiety, Vitamin B12 deficiency, Vitamin D deficiency, Delayed gastric emptying, Depression, GERD, HTN, PCOS, pre DM, SVT  Past Surgical History:  Her  has a past surgical history that includes Cocxyl removal (1995); Dilation and curettage of uterus; Tubal ligation (1998); Cesarean section (97 & 98); and laparoscopy.  Brief Summary:  [name] is a 49 y.o. female former smoker with obstructive sleep apnea, asthma, and lung nodule.       Subjective:   She was very nervous about COVID.  She got COVID vaccine in July and then lost about 20 lbs.  She was nervous about hearing all the negative information regarding COVID vaccines.    She was able to quit smoking in July.  Her breathing is much better.  Not having cough, wheeze, or sputum.  Hasn't needed albuterol.  She wasn't able to get follow up CT chest scheduled yet.  She uses CPAP nightly.  Pressure setting is okay.  Main issues is that her mask is causing irritation over the bridge of her nose.  Physical Exam:   Appearance - well kempt   ENMT - no sinus tenderness, no oral exudate, no LAN, Mallampati 3 airway, no stridor, irritation over bridge of nose  Respiratory - equal breath sounds bilaterally, no wheezing or rales  CV - s1s2 regular rate and rhythm, no murmurs  Ext - no clubbing, no edema  Skin - no rashes  Psych - normal mood and affect   Pulmonary testing:   PFT 12/05/17 >> FEV1 2.19 (71%), FEV1% 84, TLC 4.31 (81%), DLCO 75%, + BD  Chest Imaging:    Cardiac CT 06/18/18 >> 3 mm nodule LUL  Sleep Tests:   HST 06/23/16 >> AHI 99.9, SaO2 low 76%.  ONO with CPAP 11/29/18 >> test time 8 hrs 50 min.  Baseline SpO2 95%, SpO2 low 89%.  Auto CPAP 12/17/19 to 01/15/20 >> used on 30 of 30 nights with average 9 hrs 34 min.  Average AHI 0.2 with median CPAP 10 and 95 th percentile CPAP 13 cm H2O  Cardiac Tests:   Echo 11/07/13 >> EF 55 to 65%  Social History:  She  reports that she quit smoking about 2 years ago. Her smoking use included cigarettes. She started smoking about 32 years ago. She has a 7.50 pack-year smoking history. She has never used smokeless tobacco. She reports current alcohol use. She reports that she does not use drugs.  Family History:  Her family history includes Anxiety disorder in her mother; Breast cancer in her mother; Colon polyps in her maternal uncle; Depression in her father; Diabetes in her maternal aunt and mother; Heart disease in her paternal grandmother; Heart murmur in her sister; High Cholesterol in her father; High blood pressure in her father and mother; Obesity in her mother; Pancreatic cancer in her maternal grandmother; Pancreatic cancer (age of onset: 29) in her paternal grandmother; Prostate cancer (age of onset: 81) in  her maternal grandfather; Thyroid disease in her mother.     Assessment/Plan:   Obstructive sleep apnea. - she is compliant with CPAP and reports benefit - she uses [employer] for her DME - continue auto CPAP 9 to 18 cm H2O - will have her CPAP mask refitted  Mild, persistent asthma. - prn albuterol  Obesity. - encouraged her to keep up with her weight loss efforts  Lung nodule. - noted on cardiac CT from March 2020 - she has history of smoking - will arrange for non contrast CT chest and call her with results  COVID 19 advice. 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 If you have any questions, ask your nurse or doctor.        albuterol 108 (90 Base) MCG/ACT inhaler Commonly known as: VENTOLIN HFA Inhale 2 puffs into the lungs every 6 (six) hours as needed for wheezing or shortness of breath.   ALPRAZolam 0.5 MG tablet Commonly known as: Xanax Take 1 tablet (0.5 mg total) by mouth 3 (three) times daily as needed for anxiety.   B-12 Compliance Injection 1000 MCG/ML Kit Generic drug: Cyanocobalamin B12  Monthly   loratadine 10 MG tablet Commonly known as: CLARITIN Take 10 mg by mouth in the morning.   losartan 25 MG tablet Commonly known as: Cozaar Take 1 tablet (25 mg total) by mouth daily.   metoprolol tartrate 25 MG tablet Commonly known as: LOPRESSOR Take 0.5 tablets (12.5 mg total) by mouth 2 (two) times daily.   pantoprazole 40 MG tablet Commonly known as: PROTONIX TAKE 1 TABLET BY MOUTH EVERY DAY   PARoxetine 10 MG tablet Commonly known as: PAXIL Take 1 tablet (10 mg total) by mouth daily.   PRESCRIPTION MEDICATION CPAP- At bedtime and during any time of rest   topiramate 25 MG tablet Commonly known as: Topamax Take 1 tablet (25 mg total) by mouth daily.   valACYclovir 1000 MG tablet Commonly known as: VALTREX TAKE 2 TABLETS EVERY 12 HOURS FOR 1 DAY START ASAP AFTER SYTPTOM ONSET   Vitamin D (Ergocalciferol) 1.25 MG (50000 UNIT) Caps capsule Commonly known as: DRISDOL Take 1 capsule (50,000 Units total) by mouth every 7 (seven) days.       Signature:  Chesley Mires,  MD [place] Pager - [phone] 01/17/2020, 10:55 AM

## 2020-01-20 ENCOUNTER — Other Ambulatory Visit: Payer: Self-pay

## 2020-01-20 ENCOUNTER — Ambulatory Visit (INDEPENDENT_AMBULATORY_CARE_PROVIDER_SITE_OTHER): Payer: BC Managed Care – PPO | Admitting: *Deleted

## 2020-01-20 DIAGNOSIS — Z23 Encounter for immunization: Secondary | ICD-10-CM

## 2020-01-20 DIAGNOSIS — E538 Deficiency of other specified B group vitamins: Secondary | ICD-10-CM

## 2020-01-20 MED ORDER — CYANOCOBALAMIN 1000 MCG/ML IJ SOLN
1000.0000 ug | Freq: Once | INTRAMUSCULAR | Status: AC
  Administered 2020-01-20: 1000 ug via INTRAMUSCULAR

## 2020-01-20 NOTE — Progress Notes (Signed)
Pls cosign for B12 inj.Marland KitchenJohny [name]

## 2020-01-21 ENCOUNTER — Encounter: Payer: Self-pay | Admitting: Physical Therapy

## 2020-01-21 ENCOUNTER — Ambulatory Visit: Payer: BC Managed Care – PPO | Attending: Sports Medicine | Admitting: Physical Therapy

## 2020-01-21 ENCOUNTER — Other Ambulatory Visit: Payer: Self-pay

## 2020-01-21 DIAGNOSIS — M545 Low back pain, unspecified: Secondary | ICD-10-CM | POA: Diagnosis not present

## 2020-01-21 DIAGNOSIS — R293 Abnormal posture: Secondary | ICD-10-CM | POA: Diagnosis present

## 2020-01-21 DIAGNOSIS — M6281 Muscle weakness (generalized): Secondary | ICD-10-CM | POA: Insufficient documentation

## 2020-01-21 NOTE — Therapy (Signed)
[facility] [place], [place], [address] Phone: [phone]   Fax:  [phone]  Physical Therapy Evaluation  Patient Details  Name: [name] MRN: [id number] Date of Birth: [birth year] Referring Provider (PT): Wandra Feinstein, MD   Encounter Date: 01/21/2020   PT End of Session - 01/21/20 1156    Visit Number 1    Number of Visits 12    Date for PT Re-Evaluation 03/10/20    PT Start Time 1050    PT Stop Time 1140    PT Time Calculation (min) 50 min    Activity Tolerance Patient tolerated treatment well    Behavior During Therapy [facility] for tasks assessed/performed           Past Medical History:  Diagnosis Date  . Anxiety   . ASTHMA   . B12 deficiency   . DELAYED GASTRIC EMPTYING   . DEPRESSION   . GERD   . Glucose intolerance (impaired glucose tolerance) 04/09/2015  . Hypertension   . OBESITY   . OBSTRUCTIVE SLEEP APNEA   . POLYCYSTIC OVARIAN DISEASE   . Prediabetes   . SMOKER   . SOB (shortness of breath)   . SVT (supraventricular tachycardia) ([place])   . Tachycardia   . Umbilical hernia   . Umbilical hernia   . Vitamin B12 deficiency 03/2015 dx   start IM replacement q 30d  . Vitamin D deficiency 03/2015 dx    Past Surgical History:  Procedure Laterality Date  . CESAREAN SECTION  97 & 98   x's 2  . Cocxyl removal  1995  . DILATION AND CURETTAGE OF UTERUS    . LAPAROSCOPY    . TUBAL LIGATION  1998    There were no vitals filed for this visit.    Subjective Assessment - 01/21/20 1149    Subjective COVID-19 screening performed upon arrival. Patient arrives to physical therapy with reports of low back pain and L SI joint pain that began after starting a walking program on 12/21/2019. Patient reports walking ~.5 miles before pain begins where she needs to sit. Patient states resting for about 2-3 minutes alleviates the pain and she can continue her 1 mile walk. Patient reports abilty to perform  ADLs but with intermittent pain. Patient reports pain at worst as 7/10 and pain at best as 3/10 with rest and no OTC pain medication. Patient's goals are to decrease pain, improve movement, improve strength, increase walking time for exercise and improve ability to perform ADLs and home activities.    Pertinent History anxiety, Depression, Hypertension, Supraventricular tachycardia    Limitations Walking;House hold activities    How long can you walk comfortably? about half a mile    Diagnostic tests x-ray: arthritis    Patient Stated Goals improve walking distance for exercise program    Currently in Pain? Yes    Pain Score 4     Pain Location Back   and sacrum region   Pain Orientation Left    Pain Descriptors / Indicators Sore    Pain Type Acute pain    Pain Onset More than a month ago    Pain Frequency Constant    Aggravating Factors  "continuing to walk when it starts to throb"    Pain Relieving Factors "taking breaks between walking"              [facility] PT Assessment - 01/21/20 0001      Assessment   Medical Diagnosis left SI/low  back pain, coccydynia    Referring Provider (PT) Wandra Feinstein, MD    Onset Date/Surgical Date 12/21/19    Next MD Visit as needed    Prior Therapy no      Precautions   Precautions None      Restrictions   Weight Bearing Restrictions No      Balance Screen   Has the patient fallen in the past 6 months No    Has the patient had a decrease in activity level because of a fear of falling?  No    Is the patient reluctant to leave their home because of a fear of falling?  No      Home Environment   Living Environment Private residence    Living Arrangements Spouse/significant other    Home Access Stairs to enter    Entrance Stairs-Number of Steps 5      Prior Function   Level of Independence Independent with basic ADLs      Posture/Postural Control   Posture/Postural Control Postural limitations    Postural Limitations Rounded  Shoulders;Forward head;Increased lumbar lordosis;Decreased thoracic kyphosis      ROM / Strength   AROM / PROM / Strength AROM;Strength      AROM   Overall AROM  Within functional limits for tasks performed    AROM Assessment Site Lumbar    Lumbar Flexion 7" finger tip to floor    Lumbar Extension 11 degrees    Lumbar - Right Side Bend 19.5" finger tip to floor    Lumbar - Left Side Bend 21.5" finger tip to floor      Strength   Strength Assessment Site Hip;Knee    Right/Left Hip Right;Left    Right Hip Flexion 4/5    Right Hip Extension 4-/5    Right Hip ABduction 4-/5    Left Hip Flexion 4/5    Left Hip Extension 4-/5    Left Hip ABduction 4-/5    Right/Left Knee Right;Left    Right Knee Flexion 4/5    Right Knee Extension 4/5    Left Knee Flexion 4/5    Left Knee Extension 4/5      Palpation   SI assessment  (+) sacral compression test; left posterior tilt    Palpation comment increased tenderness to L SIJ      Special Tests    Special Tests Sacrolliac Tests    Sacroiliac Tests  Sacral Compression      Sacral Compression   Findings Positive    Side  Left      Transfers   Transfers Independent with all Transfers      Ambulation/Gait   Gait Pattern Step-through pattern;Decreased stance time - left;Decreased step length - right;Trunk rotated posteriorly on right;Wide base of support                      Objective measurements completed on examination: See above findings.                    PT Long Term Goals - 01/21/20 1203      PT LONG TERM GOAL #1   Title Patient will be independent with HEP.    Time 6    Period Weeks    Status New      PT LONG TERM GOAL #2   Title Patient will report ability to perform ADLs and home activites with low back pain and SIJ pain less than or equal to 2/10.  Time 6    Period Weeks    Status New      PT LONG TERM GOAL #3   Title Patient will report ability to walk 1 mile or greater without  rest breaks and low back pain and SIJ pain less than or equal to 2/10.    Time 6    Period Weeks    Status New                  Plan - 01/21/20 1158    Clinical Impression Statement Patient is a 49 year old female who presents to physical therapy with L sided low back pain, L SIJ pain, decreased thoracolumbar ROM, and decreased LE MMT that began about 12/21/2019. Patient very tender locally to left SIJ upon palpation; notable tone palpated in bilateral lumbar paraspinals. Patient (+) with sacral compression test. Patient observed with left posterior innonimate rotation upon assessment. Patient and PT discussed plan of care and dicussed HEP to which patient reported understanding. Patient would benefit from skilled physical therapy to address deficits and address patient's goals.    Personal Factors and Comorbidities Age;Time since onset of injury/illness/exacerbation;Comorbidity 2    Comorbidities anxiety, Depression, Hypertension, Supraventricular tachycardia, coccyx removal 1995    Examination-Activity Limitations Locomotion Level    Stability/Clinical Decision Making Stable/Uncomplicated    Clinical Decision Making Low    Rehab Potential Good    PT Frequency 2x / week    PT Duration 6 weeks    PT Treatment/Interventions ADLs/Self Care Home Management;Cryotherapy;Electrical Stimulation;Moist Heat;Ultrasound;Gait training;Stair training;Functional mobility training;Therapeutic activities;Therapeutic exercise;Balance training;Neuromuscular re-education;Manual techniques;Passive range of motion;Patient/family education;Spinal Manipulations    PT Next Visit Plan Nustep, LE strengthening and stretching, STW/M and/or combo to L SIJ joint region for pain relief.    PT Home Exercise Plan see patient education section    Consulted and Agree with Plan of Care Patient           Patient will benefit from skilled therapeutic intervention in order to improve the following deficits and  impairments:  Decreased activity tolerance, Decreased range of motion, Difficulty walking, Decreased strength, Pain  Visit Diagnosis: Acute left-sided low back pain, unspecified whether sciatica present - Plan: PT plan of care cert/re-cert  Muscle weakness (generalized) - Plan: PT plan of care cert/re-cert  Abnormal posture - Plan: PT plan of care cert/re-cert     Problem List Patient Active Problem List   Diagnosis Date Noted  . Nasal congestion 12/20/2019  . Aortic atherosclerosis ([place]) 12/19/2019  . Cough 11/27/2019  . Non-intractable vomiting 11/27/2019  . Blood in urine 11/08/2019  . Other hyperlipidemia 03/13/2019  . Adrenal adenoma, left 10/10/2018  . 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 Essential hypertension 11/01/2017  . Chronic frontal sinusitis 10/30/2017  . Submandibular gland mass 10/30/2017  . Prediabetes 05/18/2017  . SOB (shortness of breath) 09/24/2016  . Palpitations 09/24/2016  . Family history of early CAD 03/23/2016  . SVT (supraventricular tachycardia) ([place]) 03/14/2016  . Adrenal nodule ([place]) 01/06/2016  . Supraumbilical hernia without gangrene and without obstruction 12/02/2015  . Morbid obesity ([place]) 04/09/2015  . Vitamin D deficiency   . Vitamin B12 deficiency   . Idiopathic intracranial hypertension 05/24/2013  . Anxiety   . GERD 01/28/2009  . POLYCYSTIC OVARIAN DISEASE 02/10/2007  . Class 3 severe obesity with serious comorbidity and body mass index (BMI) of 50.0 to 59.9 in adult ([place]) 02/10/2007  . Obstructive sleep apnea 02/10/2007  . Cough variant asthma 02/10/2007    Torrie Mayers  Mareta Chesnut, PT, DPT 01/21/2020, 1:04 PM  [facility] [place], [place], [address] Phone: [phone]   Fax:  [phone]  Name: Tytiana Coles MRN: [id number] Date of Birth: 1970/10/03

## 2020-01-22 ENCOUNTER — Ambulatory Visit (INDEPENDENT_AMBULATORY_CARE_PROVIDER_SITE_OTHER): Payer: BC Managed Care – PPO | Admitting: Psychology

## 2020-01-22 DIAGNOSIS — F411 Generalized anxiety disorder: Secondary | ICD-10-CM

## 2020-01-28 ENCOUNTER — Ambulatory Visit (INDEPENDENT_AMBULATORY_CARE_PROVIDER_SITE_OTHER)
Admission: RE | Admit: 2020-01-28 | Discharge: 2020-01-28 | Disposition: A | Discharge status: Home / Self Care | Payer: BC Managed Care – PPO | Source: Ambulatory Visit | Attending: Pulmonary Disease | Admitting: Pulmonary Disease

## 2020-01-28 ENCOUNTER — Other Ambulatory Visit: Payer: Self-pay

## 2020-01-28 DIAGNOSIS — R918 Other nonspecific abnormal finding of lung field: Secondary | ICD-10-CM

## 2020-01-29 ENCOUNTER — Telehealth: Payer: Self-pay | Admitting: Pulmonary Disease

## 2020-01-29 DIAGNOSIS — R918 Other nonspecific abnormal finding of lung field: Secondary | ICD-10-CM

## 2020-01-29 NOTE — Telephone Encounter (Signed)
CT chest 01/28/20 >> scar in RML and lingular w/o change, 3 mm nodule LUL w/o change   Please let her know that lung nodules are stable.  She needs one additional follow up CT chest without contrast in April 2022.  Please schedule this.

## 2020-01-29 NOTE — Telephone Encounter (Signed)
Lm for patient.  

## 2020-01-29 NOTE — Telephone Encounter (Signed)
Spoke to patient, who is requesting CT results from 01/28/2020.  Dr. Halford Chessman, please advise. Thanks

## 2020-01-29 NOTE — Telephone Encounter (Signed)
Spoke with pt. She is aware of her results. Nothing further was needed. 

## 2020-01-29 NOTE — Telephone Encounter (Signed)
Patient is returning phone call. Patient phone number is [phone].

## 2020-01-30 ENCOUNTER — Ambulatory Visit: Payer: BC Managed Care – PPO | Admitting: Physical Therapy

## 2020-01-31 ENCOUNTER — Other Ambulatory Visit (INDEPENDENT_AMBULATORY_CARE_PROVIDER_SITE_OTHER): Payer: Self-pay | Admitting: Family Medicine

## 2020-01-31 DIAGNOSIS — R7303 Prediabetes: Secondary | ICD-10-CM

## 2020-02-04 ENCOUNTER — Ambulatory Visit (INDEPENDENT_AMBULATORY_CARE_PROVIDER_SITE_OTHER): Payer: BC Managed Care – PPO | Admitting: Psychology

## 2020-02-04 ENCOUNTER — Ambulatory Visit (INDEPENDENT_AMBULATORY_CARE_PROVIDER_SITE_OTHER): Payer: BC Managed Care – PPO | Admitting: Family Medicine

## 2020-02-04 ENCOUNTER — Other Ambulatory Visit: Payer: Self-pay

## 2020-02-04 ENCOUNTER — Encounter (INDEPENDENT_AMBULATORY_CARE_PROVIDER_SITE_OTHER): Payer: Self-pay | Admitting: Family Medicine

## 2020-02-04 VITALS — BP 116/79 | HR 67 | Temp 98.1°F | Ht 65.0 in | Wt 352.0 lb

## 2020-02-04 DIAGNOSIS — F411 Generalized anxiety disorder: Secondary | ICD-10-CM

## 2020-02-04 DIAGNOSIS — Z6841 Body Mass Index (BMI) 40.0 and over, adult: Secondary | ICD-10-CM

## 2020-02-04 DIAGNOSIS — E559 Vitamin D deficiency, unspecified: Secondary | ICD-10-CM | POA: Diagnosis not present

## 2020-02-04 DIAGNOSIS — I10 Essential (primary) hypertension: Secondary | ICD-10-CM

## 2020-02-04 MED ORDER — LOSARTAN POTASSIUM 25 MG PO TABS: 25.0000 mg | ORAL_TABLET | Freq: Every day | ORAL | 0 refills | Status: DC

## 2020-02-04 MED ORDER — TOPIRAMATE 25 MG PO TABS: 25.0000 mg | ORAL_TABLET | ORAL | 0 refills | Status: DC

## 2020-02-04 MED ORDER — PAROXETINE HCL 10 MG PO TABS: 10.0000 mg | ORAL_TABLET | Freq: Every day | ORAL | 0 refills | Status: DC

## 2020-02-04 MED ORDER — VITAMIN D (ERGOCALCIFEROL) 1.25 MG (50000 UNIT) PO CAPS: 50000.0000 [IU] | ORAL_CAPSULE | ORAL | 0 refills | Status: DC

## 2020-02-05 ENCOUNTER — Encounter: Payer: Self-pay | Admitting: Gastroenterology

## 2020-02-05 ENCOUNTER — Encounter (INDEPENDENT_AMBULATORY_CARE_PROVIDER_SITE_OTHER): Payer: Self-pay | Admitting: Family Medicine

## 2020-02-05 ENCOUNTER — Ambulatory Visit: Payer: BC Managed Care – PPO | Admitting: Gastroenterology

## 2020-02-05 VITALS — BP 118/64 | HR 85 | Ht 65.0 in | Wt 355.0 lb

## 2020-02-05 DIAGNOSIS — K760 Fatty (change of) liver, not elsewhere classified: Secondary | ICD-10-CM

## 2020-02-05 DIAGNOSIS — Z1211 Encounter for screening for malignant neoplasm of colon: Secondary | ICD-10-CM

## 2020-02-05 MED ORDER — SUTAB 1479-225-188 MG PO TABS: ORAL_TABLET | ORAL | 0 refills | Status: DC

## 2020-02-05 NOTE — Progress Notes (Signed)
Chief Complaint:   OBESITY [name] is here to discuss her progress with her obesity treatment plan along with follow-up of her obesity related diagnoses. [name] is practicing portion control and making smarter food choices, such as increasing vegetables and decreasing simple carbohydrates and states she is following her eating plan approximately 100% of the time. [name] states she is walking 40 minutes 5 times per week.  Today's visit was #: 71 Starting weight: 368 lbs Starting date: 02/14/2018 Today's weight: 352 lbs Today's date: 02/04/2020 Total lbs lost to date: 16 Total lbs lost since last in-office visit: 0  Interim History: [name] is eating more now that she is feeling better. She had a recent crisis with her anxiety related to taking the COVID vaccine. She notes she has been doing some stress eating related to her mom being ill. She feels she is ready for a structured eating plan.  Subjective:   GAD (generalized anxiety disorder). We are weaning Amaryah off of Topamax due to recent diagnosis of nephrolithiasis. [name] is on Topamax 25 mg daily. She is very anxious about weaning too quickly. She notes some stress eating due to mom's recent illness. She reports mood is stable. She is on Paxil 10 mg. She did not do well with 2 attempts to wean off of this so we will keep her on this.  She continues to see her counselor.  Vitamin D deficiency. Last Vitamin D level was at goal at 53.6 on 12/11/2019. [name] is on weekly prescription Vitamin D supplementation.    Ref. Range 12/11/2019 07:55  Vitamin D, 25-Hydroxy Latest Ref Range: 30.0 - 100.0 ng/mL 53.6   Essential hypertension. Blood pressure is well controlled on losartan.  BP Readings from Last 3 Encounters:  02/05/20 118/64  02/04/20 116/79  01/17/20 114/70   Lab Results  Component Value Date   CREATININE 0.97 11/27/2019   CREATININE 0.96 11/24/2019   CREATININE 1.03 (H) 11/16/2019   Assessment/Plan:   GAD  (generalized anxiety disorder). Marnette was instructed to take topiramate (TOPAMAX) 25 MG tablet #15 with 0 refills every other day until her next office visit and then we will stop it.  Refill was given for PARoxetine (PAXIL) 10 MG tablet daily #30 with 0 refills.  Vitamin D deficiency.  She was given a refill on her Vitamin D, Ergocalciferol, (DRISDOL) 1.25 MG (50000 UNIT) CAPS capsule every week #4 with 0 refills.  Essential hypertension.  Refill was given for losartan (COZAAR) 25 MG tablet daily #30 with 0 refills.  Class 3 severe obesity with serious comorbidity and body mass index (BMI) of 50.0 to 59.9 in adult, unspecified obesity type ([place]).  [name] is currently in the action stage of change. As such, her goal is to continue with weight loss efforts. She has agreed to the Category 2 Plan and PC/Fraser.   Exercise goals: [name] will continue her current exercise regimen.   Behavioral modification strategies: increasing lean protein intake and decreasing simple carbohydrates.  [name] has agreed to follow-up with our clinic in 3 weeks.   Objective:   Blood pressure 116/79, pulse 67, temperature 98.1 F (36.7 C), height 5' 5"  (1.651 m), weight (!) 352 lb (159.7 kg), last menstrual period 01/16/2020, SpO2 100 %. Body mass index is 58.58 kg/m.  General: Cooperative, alert, well developed, in no acute distress. HEENT: Conjunctivae and lids unremarkable. Cardiovascular: Regular rhythm.  Lungs: Normal work of breathing. Neurologic: No focal deficits.   Lab Results  Component Value Date  CREATININE 0.97 11/27/2019   BUN 11 11/27/2019   NA 138 11/27/2019   K 4.3 11/27/2019   CL 104 11/27/2019   CO2 21 11/27/2019   Lab Results  Component Value Date   ALT 20 11/27/2019   AST 8 (L) 11/27/2019   ALKPHOS 75 11/16/2019   BILITOT 0.4 11/27/2019   Lab Results  Component Value Date   HGBA1C 5.4 12/11/2019   HGBA1C 6.0 06/11/2019   HGBA1C 5.8 (H) 03/12/2019   HGBA1C 5.8 (H) 09/28/2018    HGBA1C 5.7 (H) 02/14/2018   Lab Results  Component Value Date   INSULIN 38.8 (H) 12/11/2019   INSULIN 38.0 (H) 07/19/2019   INSULIN 27.7 (H) 03/12/2019   INSULIN 2.0 (L) 09/28/2018   INSULIN 19.1 02/14/2018   Lab Results  Component Value Date   TSH 2.140 07/19/2019   Lab Results  Component Value Date   CHOL 185 12/11/2019   HDL 36 (L) 12/11/2019   LDLCALC 126 (H) 12/11/2019   LDLDIRECT 154.1 08/16/2012   TRIG 127 12/11/2019   CHOLHDL 5 06/11/2019   Lab Results  Component Value Date   WBC 8.1 12/11/2019   HGB 11.7 12/11/2019   HCT 35.7 12/11/2019   MCV 88 12/11/2019   PLT 353 12/11/2019   No results found for: IRON, TIBC, FERRITIN  Attestation Statements:   Reviewed by clinician on day of visit: allergies, medications, problem list, medical history, surgical history, family history, social history, and previous encounter notes.  IMichaelene Song, am acting as Location manager for [employer], FNP-C   I have reviewed the above documentation for accuracy and completeness, and I agree with the above. -  Georgianne Fick, FNP

## 2020-02-05 NOTE — Progress Notes (Signed)
[name]    [id number]    04-06-1971  Primary Care Physician:Burns, Claudina Lick, MD  Referring Physician: Verta Ellen., NP [place],  [place] [address]   Chief complaint:  Nausea, abdominal pain, bloating  HPI: 49 year old very pleasant female here for new patient visit with complaints of left lower quadrant abdominal pain, nausea, abdominal bloating and weight loss Her symptoms were worse in August and September with significant decrease in appetite and weight loss, she has since gained back the weight but continues to have intermittent nausea and abdominal bloating.  She also had left side discomfort which has mostly improved  Denies any rectal bleeding, melena, skin rash or joint pain.  No family history of GI malignancy.    Outpatient Encounter Medications as of 02/05/2020  Medication Sig  . albuterol (VENTOLIN HFA) 108 (90 Base) MCG/ACT inhaler Inhale 2 puffs into the lungs every 6 (six) hours as needed for wheezing or shortness of breath.  . ALPRAZolam (XANAX) 0.5 MG tablet Take 1 tablet (0.5 mg total) by mouth 3 (three) times daily as needed for anxiety.  . Cyanocobalamin (B-12 COMPLIANCE INJECTION) 1000 MCG/ML KIT B12  Monthly  . loratadine (CLARITIN) 10 MG tablet Take 10 mg by mouth in the morning.  [name] losartan (COZAAR) 25 MG tablet Take 1 tablet (25 mg total) by mouth daily.  . metoprolol tartrate (LOPRESSOR) 25 MG tablet Take 0.5 tablets (12.5 mg total) by mouth 2 (two) times daily.  . pantoprazole (PROTONIX) 40 MG tablet TAKE 1 TABLET BY MOUTH EVERY DAY  . PARoxetine (PAXIL) 10 MG tablet Take 1 tablet (10 mg total) by mouth daily.  [name] PRESCRIPTION MEDICATION CPAP- At bedtime and during any time of rest  . topiramate (TOPAMAX) 25 MG tablet Take 1 tablet (25 mg total) by mouth every other day.  . valACYclovir (VALTREX) 1000 MG tablet TAKE 2 TABLETS EVERY 12 HOURS FOR 1 DAY START ASAP AFTER SYTPTOM ONSET  . Vitamin D,  Ergocalciferol, (DRISDOL) 1.25 MG (50000 UNIT) CAPS capsule Take 1 capsule (50,000 Units total) by mouth every 7 (seven) days.   No facility-administered encounter medications on file as of 02/05/2020.    Allergies as of 02/05/2020 - Review Complete 02/05/2020  Allergen Reaction Noted  . Doxycycline Other (See Comments) 10/25/2019  . Tape Itching and Rash 11/16/2019    Past Medical History:  Diagnosis Date  . Anxiety   . ASTHMA   . B12 deficiency   . DELAYED GASTRIC EMPTYING   . DEPRESSION   . GERD   . Glucose intolerance (impaired glucose tolerance) 04/09/2015  . Hypertension   . OBESITY   . OBSTRUCTIVE SLEEP APNEA   . POLYCYSTIC OVARIAN DISEASE   . Prediabetes   . SMOKER   . SOB (shortness of breath)   . SVT (supraventricular tachycardia) ([place])   . Tachycardia   . Umbilical hernia   . Umbilical hernia   . Vitamin B12 deficiency 03/2015 dx   start IM replacement q 30d  . Vitamin D deficiency 03/2015 dx    Past Surgical History:  Procedure Laterality Date  . CESAREAN SECTION  97 & 98   x's 2  . Cocxyl removal  1995  . DILATION AND CURETTAGE OF UTERUS    . LAPAROSCOPY    . TUBAL LIGATION  1998    Family History  Problem Relation Age of Onset  . Breast cancer Mother  dx 11/2009  . Diabetes Mother   . High blood pressure Mother   . Thyroid disease Mother   . Anxiety disorder Mother   . Obesity Mother   . Diabetes Maternal Aunt   . Prostate cancer Maternal Grandfather 31       also hx colon polyps  . Heart disease Paternal Grandmother   . Pancreatic cancer Paternal Grandmother 76  . High blood pressure Father   . High Cholesterol Father   . Depression Father   . Heart murmur Sister   . Pancreatic cancer Maternal Grandmother   . Colon polyps Maternal Uncle   . Adrenal disorder Neg Hx     Social History   Socioeconomic History  . Marital status: Married    Spouse name: Arlynn Stare  . Number of children: 2  . Years of education: Not on file    . Highest education level: Not on file  Occupational History  . 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 Smoking status: Former Smoker    Packs/day: 0.25    Years: 30.00    Pack years: 7.50    Types: Cigarettes    Start date: 10/23/1987    Quit date: 10/23/2017    Years since quitting: 2.2  . Smokeless tobacco: Never Used  Vaping Use  . Vaping Use: Never used  Substance and Sexual Activity  . Alcohol use: Yes    Alcohol/week: 0.0 standard drinks    Comment: Rarely  . Drug use: No  . Sexual activity: Not on file  Other Topics Concern  . Not on file  Social History Narrative   Lives with husband & 2 kids from 1st marriage. (1st husband expired 1998-leukemia) work as Merchandiser, retail, but currently unemployed   Social Determinants of Radio broadcast assistant Strain:   . Difficulty of Paying Living Expenses: Not on file  Food Insecurity:   . Worried About Charity fundraiser in the Last Year: Not on file  . Ran Out of Food in the Last Year: Not on file  Transportation Needs:   . Lack of Transportation (Medical): Not on file  . Lack of Transportation (Non-Medical): Not on file  Physical Activity:   . Days of Exercise per Week: Not on file  . Minutes of Exercise per Session: Not on file  Stress:   . Feeling of Stress : Not on file  Social Connections:   . Frequency of Communication with Friends and Family: Not on file  . Frequency of Social Gatherings with Friends and Family: Not on file  . Attends Religious Services: Not on file  . Active Member of Clubs or Organizations: Not on file  . Attends Archivist Meetings: Not on file  . Marital Status: Not on file  Intimate Partner Violence:   . Fear of Current or Ex-Partner: Not on file  . Emotionally Abused: Not on file  . Physically Abused: Not on file  . Sexually Abused: Not on file      Review of systems: All other review of systems negative except as  mentioned in the HPI.   Physical Exam: Vitals:   02/05/20 0928  BP: 118/64  Pulse: 85   Body mass index is 59.08 kg/m. Gen:      No acute distress HEENT:  sclera anicteric Abd:      soft, non-tender; no palpable masses, no distension Ext:  No edema Neuro: alert and oriented x 3 Psych: normal mood and affect  Data Reviewed:  Reviewed labs, radiology imaging, old records and pertinent past GI work up   Assessment and Plan/Recommendations:  49 year old very pleasant female with history of morbid obesity, obstructive sleep apnea, hypertension, SVT here with complaints of intermittent nausea, abdominal bloating and left lower quadrant discomfort which have improved in the past few weeks She is due for colorectal cancer screening based on current recommendation, will proceed to schedule it BMI greater than 50, will schedule with [facility] endoscopy unit The risks and benefits as well as alternatives of endoscopic procedure(s) have been discussed and reviewed. All questions answered. The patient agrees to proceed.  GERD possibly causing intermittent nausea Protonix 40 mg daily Antireflux measures  Fatty liver: Elevated LDL cholesterol Continue with low-carb and low-fat diet, daily exercise LFT within normal limits Continue to monitor  Return in 3 months or sooner if needed   The patient was provided an opportunity to ask questions and all were answered. The patient agreed with the plan and demonstrated an understanding of the instructions.  Damaris Hippo , MD    CC: Verta Ellen., NP

## 2020-02-05 NOTE — Patient Instructions (Addendum)
Continue Protonix  You have been scheduled for  Hospital colonoscopy on 03/31/2020 at 10:15am, Separate instructions have been given  Start a Low Fat and Low carbohydrate diet  Continue with daily exercise  Try an abdominal binder from a medical supply store  If you are age 48 or older, your body mass index should be between 23-30. Your Body mass index is 59.08 kg/m. If this is out of the aforementioned range listed, please consider follow up with your Primary Care Provider.  If you are age 92 or younger, your body mass index should be between 19-25. Your Body mass index is 59.08 kg/m. If this is out of the aformentioned range listed, please consider follow up with your Primary Care Provider.    Due to recent changes in healthcare laws, you may see the results of your imaging and laboratory studies on MyChart before your provider has had a chance to review them.  We understand that in some cases there may be results that are confusing or concerning to you. Not all laboratory results come back in the same time frame and the provider may be waiting for multiple results in order to interpret others.  Please give [place] 48 hours in order for your provider to thoroughly review all the results before contacting the office for clarification of your results.    Gastroesophageal Reflux Disease, Adult Gastroesophageal reflux (GER) happens when acid from the stomach flows up into the tube that connects the mouth and the stomach (esophagus). Normally, food travels down the esophagus and stays in the stomach to be digested. However, when a person has GER, food and stomach acid sometimes move back up into the esophagus. If this becomes a more serious problem, the person may be diagnosed with a disease called gastroesophageal reflux disease (GERD). GERD occurs when the reflux:  Happens often.  Causes frequent or severe symptoms.  Causes problems such as damage to the esophagus. When stomach acid comes in  contact with the esophagus, the acid may cause soreness (inflammation) in the esophagus. Over time, GERD may create small holes (ulcers) in the lining of the esophagus. What are the causes? This condition is caused by a problem with the muscle between the esophagus and the stomach (lower esophageal sphincter, or LES). Normally, the LES muscle closes after food passes through the esophagus to the stomach. When the LES is weakened or abnormal, it does not close properly, and that allows food and stomach acid to go back up into the esophagus. The LES can be weakened by certain dietary substances, medicines, and medical conditions, including:  Tobacco use.  Pregnancy.  Having a hiatal hernia.  Alcohol use.  Certain foods and beverages, such as coffee, chocolate, onions, and peppermint. What increases the risk? You are more likely to develop this condition if you:  Have an increased body weight.  Have a connective tissue disorder.  Use NSAID medicines. What are the signs or symptoms? Symptoms of this condition include:  Heartburn.  Difficult or painful swallowing.  The feeling of having a lump in the throat.  Abitter taste in the mouth.  Bad breath.  Having a large amount of saliva.  Having an upset or bloated stomach.  Belching.  Chest pain. Different conditions can cause chest pain. Make sure you see your health care provider if you experience chest pain.  Shortness of breath or wheezing.  Ongoing (chronic) cough or a night-time cough.  Wearing away of tooth enamel.  Weight loss. How is this diagnosed?  Your health care provider will take a medical history and perform a physical exam. To determine if you have mild or severe GERD, your health care provider may also monitor how you respond to treatment. You may also have tests, including:  A test to examine your stomach and esophagus with a small camera (endoscopy).  A test thatmeasures the acidity level in your  esophagus.  A test thatmeasures how much pressure is on your esophagus.  A barium swallow or modified barium swallow test to show the shape, size, and functioning of your esophagus. How is this treated? The goal of treatment is to help relieve your symptoms and to prevent complications. Treatment for this condition may vary depending on how severe your symptoms are. Your health care provider may recommend:  Changes to your diet.  Medicine.  Surgery. Follow these instructions at home: Eating and drinking   Follow a diet as recommended by your health care provider. This may involve avoiding foods and drinks such as: ? Coffee and tea (with or without caffeine). ? Drinks that containalcohol. ? Energy drinks and sports drinks. ? Carbonated drinks or sodas. ? Chocolate and cocoa. ? Peppermint and mint flavorings. ? Garlic and onions. ? Horseradish. ? Spicy and acidic foods, including peppers, chili powder, curry powder, vinegar, hot sauces, and barbecue sauce. ? Citrus fruit juices and citrus fruits, such as oranges, lemons, and limes. ? Tomato-based foods, such as red sauce, chili, salsa, and pizza with red sauce. ? Fried and fatty foods, such as donuts, french fries, potato chips, and high-fat dressings. ? High-fat meats, such as hot dogs and fatty cuts of red and white meats, such as rib eye steak, sausage, ham, and bacon. ? High-fat dairy items, such as whole milk, butter, and cream cheese.  Eat small, frequent meals instead of large meals.  Avoid drinking large amounts of liquid with your meals.  Avoid eating meals during the 2-3 hours before bedtime.  Avoid lying down right after you eat.  Do not exercise right after you eat. Lifestyle   Do not use any products that contain nicotine or tobacco, such as cigarettes, e-cigarettes, and chewing tobacco. If you need help quitting, ask your health care provider.  Try to reduce your stress by using methods such as yoga or  meditation. If you need help reducing stress, ask your health care provider.  If you are overweight, reduce your weight to an amount that is healthy for you. Ask your health care provider for guidance about a safe weight loss goal. General instructions  Pay attention to any changes in your symptoms.  Take over-the-counter and prescription medicines only as told by your health care provider. Do not take aspirin, ibuprofen, or other NSAIDs unless your health care provider told you to do so.  Wear loose-fitting clothing. Do not wear anything tight around your waist that causes pressure on your abdomen.  Raise (elevate) the head of your bed about 6 inches (15 cm).  Avoid bending over if this makes your symptoms worse.  Keep all follow-up visits as told by your health care provider. This is important. Contact a health care provider if:  You have: ? New symptoms. ? Unexplained weight loss. ? Difficulty swallowing or it hurts to swallow. ? Wheezing or a persistent cough. ? A hoarse voice.  Your symptoms do not improve with treatment. Get help right away if you:  Have pain in your arms, neck, jaw, teeth, or back.  Feel sweaty, dizzy, or light-headed.  Have  chest pain or shortness of breath.  Vomit and your vomit looks like blood or coffee grounds.  Faint.  Have stool that is bloody or black.  Cannot swallow, drink, or eat. Summary  Gastroesophageal reflux happens when acid from the stomach flows up into the esophagus. GERD is a disease in which the reflux happens often, causes frequent or severe symptoms, or causes problems such as damage to the esophagus.  Treatment for this condition may vary depending on how severe your symptoms are. Your health care provider may recommend diet and lifestyle changes, medicine, or surgery.  Contact a health care provider if you have new or worsening symptoms.  Take over-the-counter and prescription medicines only as told by your health care  provider. Do not take aspirin, ibuprofen, or other NSAIDs unless your health care provider told you to do so.  Keep all follow-up visits as told by your health care provider. This is important. This information is not intended to replace advice given to you by your health care provider. Make sure you discuss any questions you have with your health care provider. Document Revised: 10/04/2017 Document Reviewed: 10/04/2017 Elsevier Patient Education  [place].   I appreciate the  opportunity to care for you  Thank You   Harl Bowie , MD

## 2020-02-18 ENCOUNTER — Ambulatory Visit: Payer: BC Managed Care – PPO | Admitting: Psychology

## 2020-02-18 ENCOUNTER — Ambulatory Visit (INDEPENDENT_AMBULATORY_CARE_PROVIDER_SITE_OTHER): Payer: BC Managed Care – PPO | Admitting: Psychology

## 2020-02-18 DIAGNOSIS — F411 Generalized anxiety disorder: Secondary | ICD-10-CM | POA: Diagnosis not present

## 2020-02-20 ENCOUNTER — Encounter: Payer: Self-pay | Admitting: Gastroenterology

## 2020-02-20 ENCOUNTER — Telehealth: Payer: Self-pay | Admitting: Gastroenterology

## 2020-02-20 ENCOUNTER — Other Ambulatory Visit: Payer: Self-pay

## 2020-02-20 NOTE — Telephone Encounter (Signed)
Patient rescheduled her colonoscopy to May 05, 2020. She will look for new instructions to be mailed to her. She agrees to call us if she is not comfortable with the instructions or she has any questions. Thanks me for the call.

## 2020-02-21 ENCOUNTER — Ambulatory Visit: Payer: BC Managed Care – PPO

## 2020-02-25 ENCOUNTER — Encounter (INDEPENDENT_AMBULATORY_CARE_PROVIDER_SITE_OTHER): Payer: Self-pay

## 2020-02-26 ENCOUNTER — Other Ambulatory Visit: Payer: Self-pay

## 2020-02-26 ENCOUNTER — Ambulatory Visit (INDEPENDENT_AMBULATORY_CARE_PROVIDER_SITE_OTHER): Payer: BC Managed Care – PPO

## 2020-02-26 ENCOUNTER — Encounter (INDEPENDENT_AMBULATORY_CARE_PROVIDER_SITE_OTHER): Payer: Self-pay | Admitting: Adult Health

## 2020-02-26 ENCOUNTER — Ambulatory Visit (INDEPENDENT_AMBULATORY_CARE_PROVIDER_SITE_OTHER): Payer: BC Managed Care – PPO | Admitting: Adult Health

## 2020-02-26 VITALS — BP 127/77 | HR 81 | Temp 97.6°F | Ht 65.0 in | Wt 348.0 lb

## 2020-02-26 DIAGNOSIS — E538 Deficiency of other specified B group vitamins: Secondary | ICD-10-CM

## 2020-02-26 DIAGNOSIS — I10 Essential (primary) hypertension: Secondary | ICD-10-CM | POA: Diagnosis not present

## 2020-02-26 DIAGNOSIS — F411 Generalized anxiety disorder: Secondary | ICD-10-CM

## 2020-02-26 DIAGNOSIS — E559 Vitamin D deficiency, unspecified: Secondary | ICD-10-CM | POA: Diagnosis not present

## 2020-02-26 DIAGNOSIS — Z9189 Other specified personal risk factors, not elsewhere classified: Secondary | ICD-10-CM | POA: Diagnosis not present

## 2020-02-26 DIAGNOSIS — Z6841 Body Mass Index (BMI) 40.0 and over, adult: Secondary | ICD-10-CM

## 2020-02-26 MED ORDER — LOSARTAN POTASSIUM 25 MG PO TABS: 25.0000 mg | ORAL_TABLET | Freq: Every day | ORAL | 0 refills | Status: DC

## 2020-02-26 MED ORDER — CYANOCOBALAMIN 1000 MCG/ML IJ SOLN
1000.0000 ug | INTRAMUSCULAR | Status: AC
  Administered 2020-02-26 – 2020-07-30 (×4): 1000 ug via INTRAMUSCULAR

## 2020-02-26 MED ORDER — VITAMIN D (ERGOCALCIFEROL) 1.25 MG (50000 UNIT) PO CAPS: 50000.0000 [IU] | ORAL_CAPSULE | ORAL | 0 refills | Status: DC

## 2020-02-26 NOTE — Progress Notes (Addendum)
Pt here for monthly B12 injection per Dr Quay Burow.  B12 1087mg given IM right deltoid and pt tolerated injection well.  Next B12 injection scheduled for 03/27/20.    b12 Injection given.   SBinnie Rail MD

## 2020-02-27 ENCOUNTER — Ambulatory Visit (INDEPENDENT_AMBULATORY_CARE_PROVIDER_SITE_OTHER): Payer: BC Managed Care – PPO | Admitting: Family Medicine

## 2020-02-27 ENCOUNTER — Telehealth: Payer: Self-pay | Admitting: Internal Medicine

## 2020-02-27 NOTE — Telephone Encounter (Signed)
Patient currently at [facility] in [place] getting a covid test because her husband tested positive yesterday. Patient calling very upset and crying because someone told her about the infusions and she is confused and doesn't understand if she needs that if she isn't having any symptoms. Explained to patient I would let Dr. Quay Burow know and to wait for the test results before planning anything else.  Patient # [phone]

## 2020-02-27 NOTE — Telephone Encounter (Signed)
No treatment needed at this time.  If for some reason she develops any symptoms she should be retested.  The treatment is if she is positive for covid.

## 2020-02-27 NOTE — Telephone Encounter (Signed)
   Patient reports her Covid test is negative

## 2020-02-28 NOTE — Telephone Encounter (Signed)
Left message for patient today with info.
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
## 2020-03-03 ENCOUNTER — Ambulatory Visit (INDEPENDENT_AMBULATORY_CARE_PROVIDER_SITE_OTHER): Payer: BC Managed Care – PPO | Admitting: Psychology

## 2020-03-03 ENCOUNTER — Ambulatory Visit (INDEPENDENT_AMBULATORY_CARE_PROVIDER_SITE_OTHER): Payer: BC Managed Care – PPO | Admitting: Family Medicine

## 2020-03-03 DIAGNOSIS — F411 Generalized anxiety disorder: Secondary | ICD-10-CM | POA: Diagnosis not present

## 2020-03-17 ENCOUNTER — Ambulatory Visit (INDEPENDENT_AMBULATORY_CARE_PROVIDER_SITE_OTHER): Payer: BC Managed Care – PPO | Admitting: Psychology

## 2020-03-17 DIAGNOSIS — F411 Generalized anxiety disorder: Secondary | ICD-10-CM

## 2020-03-24 ENCOUNTER — Other Ambulatory Visit (INDEPENDENT_AMBULATORY_CARE_PROVIDER_SITE_OTHER): Payer: Self-pay | Admitting: Internal Medicine

## 2020-03-24 DIAGNOSIS — F411 Generalized anxiety disorder: Secondary | ICD-10-CM

## 2020-03-25 ENCOUNTER — Encounter (INDEPENDENT_AMBULATORY_CARE_PROVIDER_SITE_OTHER): Payer: Self-pay

## 2020-03-25 ENCOUNTER — Ambulatory Visit (INDEPENDENT_AMBULATORY_CARE_PROVIDER_SITE_OTHER): Payer: BC Managed Care – PPO | Admitting: Adult Health

## 2020-03-25 ENCOUNTER — Other Ambulatory Visit (INDEPENDENT_AMBULATORY_CARE_PROVIDER_SITE_OTHER): Payer: Self-pay | Admitting: Adult Health

## 2020-03-25 DIAGNOSIS — E559 Vitamin D deficiency, unspecified: Secondary | ICD-10-CM

## 2020-03-27 ENCOUNTER — Other Ambulatory Visit: Payer: Self-pay

## 2020-03-27 ENCOUNTER — Other Ambulatory Visit (HOSPITAL_COMMUNITY): Payer: BC Managed Care – PPO

## 2020-03-27 ENCOUNTER — Ambulatory Visit (INDEPENDENT_AMBULATORY_CARE_PROVIDER_SITE_OTHER): Payer: BC Managed Care – PPO

## 2020-03-27 DIAGNOSIS — E538 Deficiency of other specified B group vitamins: Secondary | ICD-10-CM

## 2020-03-27 MED ORDER — CYANOCOBALAMIN 1000 MCG/ML IJ SOLN
1000.0000 ug | Freq: Once | INTRAMUSCULAR | Status: AC
  Administered 2020-03-27: 1000 ug via INTRAMUSCULAR

## 2020-03-27 NOTE — Progress Notes (Signed)
Patient seen in office today and given B12 injection per Dr. Quay Burow. Patient tolerated injection well.

## 2020-03-27 NOTE — Progress Notes (Signed)
b12 Injection given.   Binnie Rail, MD

## 2020-03-31 ENCOUNTER — Ambulatory Visit: Payer: BC Managed Care – PPO | Admitting: Psychology

## 2020-04-01 ENCOUNTER — Ambulatory Visit: Payer: BC Managed Care – PPO | Admitting: Psychology

## 2020-04-14 ENCOUNTER — Ambulatory Visit (INDEPENDENT_AMBULATORY_CARE_PROVIDER_SITE_OTHER): Payer: 59 | Admitting: Psychology

## 2020-04-14 ENCOUNTER — Telehealth: Payer: Self-pay | Admitting: Pulmonary Disease

## 2020-04-14 ENCOUNTER — Other Ambulatory Visit (INDEPENDENT_AMBULATORY_CARE_PROVIDER_SITE_OTHER): Payer: Self-pay | Admitting: Adult Health

## 2020-04-14 ENCOUNTER — Other Ambulatory Visit: Payer: Self-pay | Admitting: Gastroenterology

## 2020-04-14 DIAGNOSIS — E559 Vitamin D deficiency, unspecified: Secondary | ICD-10-CM

## 2020-04-14 DIAGNOSIS — F411 Generalized anxiety disorder: Secondary | ICD-10-CM | POA: Diagnosis not present

## 2020-04-14 NOTE — Telephone Encounter (Signed)
She should get COVID vaccine booster.

## 2020-04-14 NOTE — Telephone Encounter (Signed)
ATC patient left detailed message with Dr. Juanetta Gosling recommendations for patient per her DPR.   Nothing further needed at this time

## 2020-04-14 NOTE — Telephone Encounter (Signed)
Pt was last seen by Mina Marble, FNP and does not have an upcoming appt scheduled.

## 2020-04-14 NOTE — Telephone Encounter (Signed)
Called and spoke with patient.  Advised her that her CT is scheduled for April 2022 and the [facility] will call her to schedule the date for the CT scan.  She also wants to know if Dr. Halford Chessman advises that she get her Covid booster, it is coming up on her 6 months since her 2nd vaccine.  Dr. Halford Chessman, please advise regarding patient getting her Covid booster.  Thank you.

## 2020-04-15 ENCOUNTER — Encounter (INDEPENDENT_AMBULATORY_CARE_PROVIDER_SITE_OTHER): Payer: Self-pay | Admitting: Family Medicine

## 2020-04-15 DIAGNOSIS — E559 Vitamin D deficiency, unspecified: Secondary | ICD-10-CM

## 2020-04-16 MED ORDER — VITAMIN D (ERGOCALCIFEROL) 1.25 MG (50000 UNIT) PO CAPS: 50000.0000 [IU] | ORAL_CAPSULE | ORAL | 0 refills | Status: DC

## 2020-04-21 ENCOUNTER — Encounter (INDEPENDENT_AMBULATORY_CARE_PROVIDER_SITE_OTHER): Payer: Self-pay | Admitting: Adult Health

## 2020-04-21 ENCOUNTER — Ambulatory Visit (INDEPENDENT_AMBULATORY_CARE_PROVIDER_SITE_OTHER): Payer: Managed Care, Other (non HMO) | Admitting: Adult Health

## 2020-04-21 ENCOUNTER — Other Ambulatory Visit: Payer: Self-pay

## 2020-04-21 ENCOUNTER — Telehealth: Payer: Self-pay | Admitting: Gastroenterology

## 2020-04-21 VITALS — BP 126/69 | HR 61 | Temp 97.7°F | Ht 65.0 in | Wt 362.0 lb

## 2020-04-21 DIAGNOSIS — R7303 Prediabetes: Secondary | ICD-10-CM | POA: Diagnosis not present

## 2020-04-21 DIAGNOSIS — Z9189 Other specified personal risk factors, not elsewhere classified: Secondary | ICD-10-CM

## 2020-04-21 DIAGNOSIS — E7849 Other hyperlipidemia: Secondary | ICD-10-CM | POA: Diagnosis not present

## 2020-04-21 DIAGNOSIS — E559 Vitamin D deficiency, unspecified: Secondary | ICD-10-CM

## 2020-04-21 DIAGNOSIS — Z6841 Body Mass Index (BMI) 40.0 and over, adult: Secondary | ICD-10-CM

## 2020-04-21 DIAGNOSIS — I10 Essential (primary) hypertension: Secondary | ICD-10-CM

## 2020-04-21 DIAGNOSIS — F411 Generalized anxiety disorder: Secondary | ICD-10-CM

## 2020-04-21 MED ORDER — LOSARTAN POTASSIUM 25 MG PO TABS: 25.0000 mg | ORAL_TABLET | Freq: Every day | ORAL | 0 refills | Status: DC

## 2020-04-21 NOTE — Telephone Encounter (Signed)
Case has been cancelled at the hospital.  I left a message that the case has been cancelled and that she can call back to reschedule when she is ready.

## 2020-04-21 NOTE — Telephone Encounter (Signed)
Patient called requesting to cancel procedure at the hospital for she will no longer have insurance

## 2020-04-22 LAB — COMPREHENSIVE METABOLIC PANEL
ALT: 12 IU/L (ref 0–32)
AST: 6 IU/L (ref 0–40)
Albumin/Globulin Ratio: 1.2 (ref 1.2–2.2)
Albumin: 4.1 g/dL (ref 3.8–4.8)
Alkaline Phosphatase: 105 IU/L (ref 44–121)
BUN/Creatinine Ratio: 16 (ref 9–23)
BUN: 16 mg/dL (ref 6–24)
Bilirubin Total: <0.2 mg/dL (ref 0.0–1.2)
CO2: 25 mmol/L (ref 20–29)
Calcium: 9.2 mg/dL (ref 8.7–10.2)
Chloride: 103 mmol/L (ref 96–106)
Creatinine, Ser: 0.99 mg/dL (ref 0.57–1.00)
GFR calc Af Amer: 78 mL/min/{1.73_m2} (ref >59)
GFR calc non Af Amer: 68 mL/min/{1.73_m2} (ref >59)
Globulin, Total: 3.4 g/dL (ref 1.5–4.5)
Glucose: 99 mg/dL (ref 65–99)
Potassium: 5 mmol/L (ref 3.5–5.2)
Sodium: 140 mmol/L (ref 134–144)
Total Protein: 7.5 g/dL (ref 6.0–8.5)

## 2020-04-22 LAB — LIPID PANEL
Chol/HDL Ratio: 5 ratio — ABNORMAL HIGH (ref 0.0–4.4)
Cholesterol, Total: 198 mg/dL (ref 100–199)
HDL: 40 mg/dL (ref >39)
LDL Chol Calc (NIH): 141 mg/dL — ABNORMAL HIGH (ref 0–99)
Triglycerides: 94 mg/dL (ref 0–149)
VLDL Cholesterol Cal: 17 mg/dL (ref 5–40)

## 2020-04-22 LAB — HEMOGLOBIN A1C
Est. average glucose Bld gHb Est-mCnc: 126 mg/dL
Hgb A1c MFr Bld: 6 % — ABNORMAL HIGH (ref 4.8–5.6)

## 2020-04-22 LAB — VITAMIN D 25 HYDROXY (VIT D DEFICIENCY, FRACTURES): Vit D, 25-Hydroxy: 45.8 ng/mL (ref 30.0–100.0)

## 2020-04-22 LAB — INSULIN, RANDOM: INSULIN: 50.1 u[IU]/mL — ABNORMAL HIGH (ref 2.6–24.9)

## 2020-04-22 NOTE — Telephone Encounter (Signed)
[name] is the patient that cancelled and created the opening.

## 2020-04-22 NOTE — Telephone Encounter (Signed)
Please switch Ms [name] to 1/25 8:30 am appt from 2/1 appointment. Thank you

## 2020-04-22 NOTE — Progress Notes (Signed)
Chief Complaint:   OBESITY [name] is here to discuss her progress with her obesity treatment plan along with follow-up of her obesity related diagnoses. [name] is on the Category 2 Plan and states she is following her eating plan approximately 70% of the time. [name] states she is exercise 0 minutes 0 times per week.  Today's visit was #: 23 Starting weight: 368 lbs Starting date: 02/14/2018 Today's weight: 362 lbs Today's date: 04/21/2020 Total lbs lost to date: 6 lbs Total lbs lost since last in-office visit: 0  Interim History: [name] deviated from Category 2 meal plan over the holidays and she is ready to "get back on track". Her next goal is to be down at least 1 lb at next OV. She has weaned off Topiramate. Her last dose was 03/10/2020.   Subjective:   1. Vitamin D deficiency [name]'s Vitamin D level was 53.6 on 12/11/2019. She is currently taking prescription vitamin D 50,000 IU each week. She denies nausea, vomiting or muscle weakness. Last time [name] came off Vit D supplement, her Vit D level plummeted.  Ref. Range 12/11/2019 07:55  Vitamin D, 25-Hydroxy Latest Ref Range: 30.0 - 100.0 ng/mL 53.6   2. Essential hypertension Blood pressure and heart rate are at goal today. She is on losartan 25 mg daily and Metoprolol 25 mg 1/2 tab twice a day.  BP Readings from Last 3 Encounters:  04/21/20 126/69  02/26/20 127/77  02/05/20 118/64    3. Pre-diabetes 12/11/2019 normal A1c with elevated insulin 38.8. [name] has been off Metformin since Sept 2021. She has had a recent increase in polyphagia due to increased levels of stress.  Lab Results  Component Value Date   INSULIN 50.1 (H) 04/21/2020   INSULIN 38.8 (H) 12/11/2019   INSULIN 38.0 (H) 07/19/2019   INSULIN 27.7 (H) 03/12/2019   INSULIN 2.0 (L) 09/28/2018    4. Other hyperlipidemia 12/11/2019 Lipid panel showed elevated LDL with low HDL. She is not on statin therapy.    Ref. Range 12/11/2019 07:55  Cholesterol, Total Latest Ref  Range: 100 - 199 mg/dL 185  HDL Cholesterol Latest Ref Range: >39 mg/dL 36 (L)  LDL/HDL Ratio Latest Ref Range: 0.0 - 3.2 ratio 3.5 (H)  Triglycerides Latest Ref Range: 0 - 149 mg/dL 127  VLDL Cholesterol Cal Latest Ref Range: 5 - 40 mg/dL 23  LDL Chol Calc (NIH) Latest Ref Range: 0 - 99 mg/dL 126 (H)   5. GAD (generalized anxiety disorder) [name] is on paroxetine 10 mg daily. She reports stable mood and denies suicidal and homicidal ideations. She weaned off Topiramate since 03/10/2020.    6. At risk for diabetes mellitus [name] is at higher than average risk for developing diabetes due to obesity and insulin resistance.   Assessment/Plan:   1. Vitamin D deficiency Low Vitamin D level contributes to fatigue and are associated with obesity, breast, and colon cancer. She agrees to continue to take prescription Vitamin D @50 ,000 IU every week and will follow-up for routine testing of Vitamin D, at least 2-3 times per year to avoid over-replacement. Check labs today. We will refill Vit D is appropriate. - VITAMIN D 25 Hydroxy (Vit-D Deficiency, Fractures)  2. Essential hypertension Tareva is working on healthy weight loss and exercise to improve blood pressure control. We will watch for signs of hypotension as she continues her lifestyle modifications. Refill losartan 25 mg daily. Check labs today. - Comprehensive metabolic panel - losartan (COZAAR) 25 MG tablet; Take 1  tablet (25 mg total) by mouth daily.  Dispense: 30 tablet; Refill: 0  3. Pre-diabetes [name] will continue to work on weight loss, exercise, and decreasing simple carbohydrates to help decrease the risk of diabetes. Check labs today. - Hemoglobin A1c - Insulin, random  4. Other hyperlipidemia Cardiovascular risk and specific lipid/LDL goals reviewed.  We discussed several lifestyle modifications today and [name] will continue to work on diet, exercise and weight loss efforts. Orders and follow up as documented in patient record.  Check labs today.  Counseling Intensive lifestyle modifications are the first line treatment for this issue. . Dietary changes: Increase soluble fiber. Decrease simple carbohydrates. . Exercise changes: Moderate to vigorous-intensity aerobic activity 150 minutes per week if tolerated. . Lipid-lowering medications: see documented in medical record.  - Comprehensive metabolic panel - Lipid panel  5. GAD (generalized anxiety disorder) Behavior modification techniques were discussed today to help [name] deal with her anxiety.  Orders and follow up as documented in patient record.   6. At risk for diabetes mellitus [name] was given approximately 15 minutes of diabetes education and counseling today. We discussed intensive lifestyle modifications today with an emphasis on weight loss as well as increasing exercise and decreasing simple carbohydrates in her diet. We also reviewed medication options with an emphasis on risk versus benefit of those discussed.   Repetitive spaced learning was employed today to elicit superior memory formation and behavioral change.  7. Class 3 severe obesity with serious comorbidity and body mass index (BMI) of 60.0 to 69.9 in adult, unspecified obesity type ([name]) [name] is currently in the action stage of change. As such, her goal is to continue with weight loss efforts. She has agreed to the Category 2 Plan.   Handouts: Protein list  Exercise goals: Increase daily walking at least 5-10 minutes a day.  Behavioral modification strategies: increasing lean protein intake, decreasing simple carbohydrates, no skipping meals, meal planning and cooking strategies, keeping healthy foods in the home, better snacking choices and planning for success.  [name] has agreed to follow-up with our clinic in 2 weeks. She was informed of the importance of frequent follow-up visits to maximize her success with intensive lifestyle modifications for her multiple health conditions.   [name] was  informed we would discuss her lab results at her next visit unless there is a critical issue that needs to be addressed sooner. [name] agreed to keep her next visit at the agreed upon time to discuss these results.  Objective:   Blood pressure 126/69, pulse 61, temperature 97.7 F (36.5 C), height 5' 5"  (1.651 m), weight (!) 362 lb (164.2 kg), SpO2 99 %. Body mass index is 60.24 kg/m.  General: Cooperative, alert, well developed, in no acute distress. HEENT: Conjunctivae and lids unremarkable. Cardiovascular: Regular rhythm.  Lungs: Normal work of breathing. Neurologic: No focal deficits.   Lab Results  Component Value Date   CREATININE 0.99 04/21/2020   BUN 16 04/21/2020   NA 140 04/21/2020   K 5.0 04/21/2020   CL 103 04/21/2020   CO2 25 04/21/2020   Lab Results  Component Value Date   ALT 12 04/21/2020   AST 6 04/21/2020   ALKPHOS 105 04/21/2020   BILITOT <0.2 04/21/2020   Lab Results  Component Value Date   HGBA1C 6.0 (H) 04/21/2020   HGBA1C 5.4 12/11/2019   HGBA1C 6.0 06/11/2019   HGBA1C 5.8 (H) 03/12/2019   HGBA1C 5.8 (H) 09/28/2018   Lab Results  Component Value Date   INSULIN  50.1 (H) 04/21/2020   INSULIN 38.8 (H) 12/11/2019   INSULIN 38.0 (H) 07/19/2019   INSULIN 27.7 (H) 03/12/2019   INSULIN 2.0 (L) 09/28/2018   Lab Results  Component Value Date   TSH 2.140 07/19/2019   Lab Results  Component Value Date   CHOL 198 04/21/2020   HDL 40 04/21/2020   LDLCALC 141 (H) 04/21/2020   LDLDIRECT 154.1 08/16/2012   TRIG 94 04/21/2020   CHOLHDL 5.0 (H) 04/21/2020   Lab Results  Component Value Date   WBC 8.1 12/11/2019   HGB 11.7 12/11/2019   HCT 35.7 12/11/2019   MCV 88 12/11/2019   PLT 353 12/11/2019    Attestation Statements:   Reviewed by clinician on day of visit: allergies, medications, problem list, medical history, surgical history, family history, social history, and previous encounter notes.  Coral Ceo, am acting as  Location manager for Mina Marble, NP.  I have reviewed the above documentation for accuracy and completeness, and I agree with the above. -  Cardarius Senat d. Javaris Wigington, NP-C

## 2020-04-22 NOTE — Telephone Encounter (Signed)
Got it!     Thanks =).

## 2020-04-28 ENCOUNTER — Ambulatory Visit: Payer: BC Managed Care – PPO | Admitting: Psychology

## 2020-04-29 ENCOUNTER — Other Ambulatory Visit (INDEPENDENT_AMBULATORY_CARE_PROVIDER_SITE_OTHER): Payer: Self-pay | Admitting: Family Medicine

## 2020-04-29 ENCOUNTER — Other Ambulatory Visit: Payer: Self-pay

## 2020-04-29 ENCOUNTER — Ambulatory Visit (INDEPENDENT_AMBULATORY_CARE_PROVIDER_SITE_OTHER): Payer: Managed Care, Other (non HMO)

## 2020-04-29 DIAGNOSIS — E538 Deficiency of other specified B group vitamins: Secondary | ICD-10-CM | POA: Diagnosis not present

## 2020-04-29 DIAGNOSIS — E559 Vitamin D deficiency, unspecified: Secondary | ICD-10-CM

## 2020-04-29 NOTE — Telephone Encounter (Signed)
This patient was last seen by Valetta Fuller Danford,FNP and currently has an upcoming appt scheduled on 05/13/20 with her.

## 2020-04-29 NOTE — Progress Notes (Signed)
Pt here for monthly B12 injection per Dr Quay Burow.   B12 1071mg given IM left deltoid and pt tolerated injection well.  Pt to schdule next B12 injection upon check out.

## 2020-05-01 ENCOUNTER — Other Ambulatory Visit (HOSPITAL_COMMUNITY): Payer: BC Managed Care – PPO

## 2020-05-05 ENCOUNTER — Ambulatory Visit (HOSPITAL_COMMUNITY): Admit: 2020-05-05 | Payer: BC Managed Care – PPO | Admitting: Gastroenterology

## 2020-05-05 ENCOUNTER — Encounter (HOSPITAL_COMMUNITY): Payer: Self-pay

## 2020-05-05 SURGERY — COLONOSCOPY WITH PROPOFOL
Anesthesia: Monitor Anesthesia Care

## 2020-05-08 ENCOUNTER — Encounter (INDEPENDENT_AMBULATORY_CARE_PROVIDER_SITE_OTHER): Payer: Self-pay | Admitting: Adult Health

## 2020-05-12 ENCOUNTER — Ambulatory Visit: Payer: BC Managed Care – PPO | Admitting: Psychology

## 2020-05-13 ENCOUNTER — Ambulatory Visit (INDEPENDENT_AMBULATORY_CARE_PROVIDER_SITE_OTHER): Payer: Managed Care, Other (non HMO) | Admitting: Adult Health

## 2020-05-14 ENCOUNTER — Ambulatory Visit: Payer: Self-pay

## 2020-05-20 NOTE — Telephone Encounter (Signed)
Pt called to inform that sh took her last pill of Pantoprazole today so she does not want to miss a day without taking medications. Pls call her.

## 2020-05-26 ENCOUNTER — Ambulatory Visit: Payer: BC Managed Care – PPO | Admitting: Psychology

## 2020-06-02 ENCOUNTER — Other Ambulatory Visit (INDEPENDENT_AMBULATORY_CARE_PROVIDER_SITE_OTHER): Payer: Self-pay | Admitting: Adult Health

## 2020-06-02 DIAGNOSIS — I10 Essential (primary) hypertension: Secondary | ICD-10-CM

## 2020-06-05 ENCOUNTER — Other Ambulatory Visit: Payer: Self-pay

## 2020-06-05 ENCOUNTER — Ambulatory Visit (INDEPENDENT_AMBULATORY_CARE_PROVIDER_SITE_OTHER): Payer: Managed Care, Other (non HMO)

## 2020-06-05 DIAGNOSIS — E538 Deficiency of other specified B group vitamins: Secondary | ICD-10-CM | POA: Diagnosis not present

## 2020-06-05 NOTE — Progress Notes (Signed)
Pt here for monthly B12 injection per Dr Quay Burow.  B12 1068mg given IM right deltoid and pt tolerated injection well.  Pt has OV on 06/24/20 with PCP.

## 2020-06-08 ENCOUNTER — Telehealth: Payer: Self-pay | Admitting: Pulmonary Disease

## 2020-06-08 ENCOUNTER — Encounter (INDEPENDENT_AMBULATORY_CARE_PROVIDER_SITE_OTHER): Payer: Self-pay | Admitting: Family Medicine

## 2020-06-08 DIAGNOSIS — I10 Essential (primary) hypertension: Secondary | ICD-10-CM

## 2020-06-08 MED ORDER — LOSARTAN POTASSIUM 25 MG PO TABS: 25.0000 mg | ORAL_TABLET | Freq: Every day | ORAL | 0 refills | Status: DC

## 2020-06-08 NOTE — Telephone Encounter (Signed)
Fyi.

## 2020-06-08 NOTE — Telephone Encounter (Signed)
Checked & CT is to be scheduled in April.  Called pt & told her we do have her order & one of the PCC's will call her in March to schedule April CT she states ok.  She wanted [place] to be aware her insurance has changed to [place].  I transferred her to front desk so she can give them insurance info & it can be loaded.  Nothing further needed.

## 2020-06-09 ENCOUNTER — Ambulatory Visit: Payer: 59 | Admitting: Psychology

## 2020-06-15 ENCOUNTER — Other Ambulatory Visit: Payer: Self-pay | Admitting: Internal Medicine

## 2020-06-15 DIAGNOSIS — Z1231 Encounter for screening mammogram for malignant neoplasm of breast: Secondary | ICD-10-CM

## 2020-06-23 ENCOUNTER — Ambulatory Visit: Payer: 59 | Admitting: Psychology

## 2020-06-23 NOTE — Progress Notes (Signed)
Subjective:    Patient ID: [name], female    DOB: 06/10/1970, 50 y.o.   MRN: [id number]   This visit occurred during the SARS-CoV-2 public health emergency.  Safety protocols were in place, including screening questions prior to the visit, additional usage of staff PPE, and extensive cleaning of exam room while observing appropriate contact time as indicated for disinfecting solutions.    HPI She is here for a physical exam.   Overall she is doing well.  She continues to struggle with her anxiety.  She did gain weight secondary to stress and other factors.  She is ready to start working on weight loss again.  Medications and allergies reviewed with patient and updated if appropriate.  Patient Active Problem List   Diagnosis Date Noted  . Aortic atherosclerosis ([place]) 12/19/2019  . Blood in urine 11/08/2019  . Other hyperlipidemia 03/13/2019  . Adrenal adenoma, left 10/10/2018  . 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 Essential hypertension 11/01/2017  . Chronic frontal sinusitis 10/30/2017  . Submandibular gland mass 10/30/2017  . Prediabetes 05/18/2017  . SOB (shortness of breath) 09/24/2016  . Palpitations 09/24/2016  . Family history of early CAD 03/23/2016  . SVT (supraventricular tachycardia) ([place]) 03/14/2016  . Adrenal nodule ([place]) 01/06/2016  . Supraumbilical hernia without gangrene and without obstruction 12/02/2015  . Morbid obesity ([place]) 04/09/2015  . Vitamin D deficiency   . Vitamin B12 deficiency   . Idiopathic intracranial hypertension 05/24/2013  . Anxiety   . GERD 01/28/2009  . POLYCYSTIC OVARIAN DISEASE 02/10/2007  . Class 3 severe obesity with serious comorbidity and body mass index (BMI) of 50.0 to 59.9 in adult ([place]) 02/10/2007  . Obstructive sleep apnea 02/10/2007  . Cough variant asthma 02/10/2007    Current Outpatient Medications on File Prior to Visit  Medication Sig Dispense Refill  . albuterol (VENTOLIN HFA) 108 (90 Base)  MCG/ACT inhaler Inhale 2 puffs into the lungs every 6 (six) hours as needed for wheezing or shortness of breath. 18 g 8  . Cyanocobalamin (B-12 COMPLIANCE INJECTION) 1000 MCG/ML KIT B12  Monthly    . loratadine (CLARITIN) 10 MG tablet Take 10 mg by mouth in the morning.    . metoprolol tartrate (LOPRESSOR) 25 MG tablet Take 0.5 tablets (12.5 mg total) by mouth 2 (two) times daily. 90 tablet 3  . PRESCRIPTION MEDICATION CPAP- At bedtime and during any time of rest    . valACYclovir (VALTREX) 1000 MG tablet TAKE 2 TABLETS EVERY 12 HOURS FOR 1 DAY START ASAP AFTER SYTPTOM ONSET 12 tablet 3  . Vitamin D, Ergocalciferol, (DRISDOL) 1.25 MG (50000 UNIT) CAPS capsule Take 1 capsule (50,000 Units total) by mouth every 7 (seven) days. 12 capsule 0   Current Facility-Administered Medications on File Prior to Visit  Medication Dose Route Frequency Provider Last Rate Last Admin  . cyanocobalamin ((VITAMIN B-12)) injection 1,000 mcg  1,000 mcg Intramuscular Q30 days Binnie Rail, MD   1,000 mcg at 06/05/20 1446    Past Medical History:  Diagnosis Date  . Anxiety   . ASTHMA   . B12 deficiency   . DELAYED GASTRIC EMPTYING   . DEPRESSION   . GERD   . Glucose intolerance (impaired glucose tolerance) 04/09/2015  . Hypertension   . OBESITY   . OBSTRUCTIVE SLEEP APNEA   . POLYCYSTIC OVARIAN DISEASE   . Prediabetes   . SMOKER   . SOB (shortness of breath)   . SVT (supraventricular tachycardia) ([place])   .  Tachycardia   . Umbilical hernia   . Umbilical hernia   . Vitamin B12 deficiency 03/2015 dx   start IM replacement q 30d  . Vitamin D deficiency 03/2015 dx    Past Surgical History:  Procedure Laterality Date  . CESAREAN SECTION  97 & 98   x's 2  . Cocxyl removal  1995  . DILATION AND CURETTAGE OF UTERUS    . LAPAROSCOPY    . TUBAL LIGATION  1998    Social History   Socioeconomic History  . Marital status: Married    Spouse name: Oluwaseun Bruyere  . Number of children: 2  . Years of  education: Not on file  . Highest education level: Not on file  Occupational History  . 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 Smoking status: Former Smoker    Packs/day: 0.25    Years: 30.00    Pack years: 7.50    Types: Cigarettes    Start date: 10/23/1987    Quit date: 10/23/2017    Years since quitting: 2.6  . Smokeless tobacco: Never Used  Vaping Use  . Vaping Use: Never used  Substance and Sexual Activity  . Alcohol use: Yes    Alcohol/week: 0.0 standard drinks    Comment: Rarely  . Drug use: No  . Sexual activity: Not on file  Other Topics Concern  . Not on file  Social History Narrative   Lives with husband & 2 kids from 1st marriage. (1st husband expired 1998-leukemia) work as Merchandiser, retail, but currently unemployed   Social Determinants of Radio broadcast assistant Strain: Not on [employer] Insecurity: Not on file  Transportation Needs: Not on file  Physical Activity: Not on file  Stress: Not on file  Social Connections: Not on file    Family History  Problem Relation Age of Onset  . Breast cancer Mother        dx 11/2009  . Diabetes Mother   . High blood pressure Mother   . Thyroid disease Mother   . Anxiety disorder Mother   . Obesity Mother   . Diabetes Maternal Aunt   . Prostate cancer Maternal Grandfather 12       also hx colon polyps  . Heart disease Paternal Grandmother   . Pancreatic cancer Paternal Grandmother 54  . High blood pressure Father   . High Cholesterol Father   . Depression Father   . Heart murmur Sister   . Pancreatic cancer Maternal Grandmother   . Colon polyps Maternal Uncle   . Adrenal disorder Neg Hx     Review of Systems  Constitutional: Negative for chills and fever.  Eyes: Negative for visual disturbance (blurry vision).  Respiratory: Negative for cough, shortness of breath and wheezing.   Cardiovascular: Positive for palpitations and leg swelling (mild).  Negative for chest pain.  Gastrointestinal: Positive for nausea (in morning). Negative for abdominal pain, blood in stool, constipation and diarrhea.       No gerd  Genitourinary: Negative for dysuria.  Musculoskeletal: Negative for arthralgias (mild, intermittent) and back pain.  Skin: Negative for rash (keratosis on feet).  Neurological: Positive for light-headedness. Negative for headaches.  Psychiatric/Behavioral: Positive for dysphoric mood (mild seasonal). The patient is nervous/anxious.        Objective:   Vitals:   06/24/20 1012  BP: 130/78  Pulse: 68  Temp: 98.2 F (36.8 C)  SpO2: 99%   Filed Weights   06/24/20 1012  Weight: (!) 372 lb (168.7 kg)   Body mass index is 61.9 kg/m.  BP Readings from Last 3 Encounters:  06/24/20 130/78  04/21/20 126/69  02/26/20 127/77    Wt Readings from Last 3 Encounters:  06/24/20 (!) 372 lb (168.7 kg)  04/21/20 (!) 362 lb (164.2 kg)  02/26/20 (!) 348 lb (157.9 kg)     Physical Exam Constitutional: She appears well-developed and well-nourished. No distress.  HENT:  Head: Normocephalic and atraumatic.  Right Ear: External ear normal. Normal ear canal and TM Left Ear: External ear normal.  Normal ear canal and TM Mouth/Throat: Oropharynx is clear and moist.  Eyes: Conjunctivae and EOM are normal.  Neck: Neck supple. No tracheal deviation present. No thyromegaly present.  No carotid bruit  Cardiovascular: Normal rate, regular rhythm and normal heart sounds.   No murmur heard.  No edema. Pulmonary/Chest: Effort normal and breath sounds normal. No respiratory distress. She has no wheezes. She has no rales.  Breast: deferred   Abdominal: Soft. She exhibits no distension. There is no tenderness.  Lymphadenopathy: She has no cervical adenopathy.  Skin: Skin is warm and dry. She is not diaphoretic.  Psychiatric: She has a normal mood and affect. Her behavior is normal.        Assessment & Plan:   Physical exam: Screening  blood work    deferred Immunizations  Consider covid booster, others up to date Colonoscopy  - discussed - was scheduled - will reschedule Mammogram  scheduled Gyn  Up to date  Eye exams  Up to date  Exercise  Just started walking-stressed the importance of regular exercising and building up slowly Weight  Working on weight loss - virtual with [facility].  Discussed weight loss at length. Substance abuse  none      See Problem List for Assessment and Plan of chronic medical problems.

## 2020-06-23 NOTE — Patient Instructions (Addendum)
Your a1c was checked.     Medications changes include :   none     Please followup in 6 months     Health Maintenance, Female Adopting a healthy lifestyle and getting preventive care are important in promoting health and wellness. Ask your health care provider about:  The right schedule for you to have regular tests and exams.  Things you can do on your own to prevent diseases and keep yourself healthy. What should I know about diet, weight, and exercise? Eat a healthy diet  Eat a diet that includes plenty of vegetables, fruits, low-fat dairy products, and lean protein.  Do not eat a lot of foods that are high in solid fats, added sugars, or sodium.   Maintain a healthy weight Body mass index (BMI) is used to identify weight problems. It estimates body fat based on height and weight. Your health care provider can help determine your BMI and help you achieve or maintain a healthy weight. Get regular exercise Get regular exercise. This is one of the most important things you can do for your health. Most adults should:  Exercise for at least 150 minutes each week. The exercise should increase your heart rate and make you sweat (moderate-intensity exercise).  Do strengthening exercises at least twice a week. This is in addition to the moderate-intensity exercise.  Spend less time sitting. Even light physical activity can be beneficial. Watch cholesterol and blood lipids Have your blood tested for lipids and cholesterol at 49 years of age, then have this test every 5 years. Have your cholesterol levels checked more often if:  Your lipid or cholesterol levels are high.  You are older than 49 years of age.  You are at high risk for heart disease. What should I know about cancer screening? Depending on your health history and family history, you may need to have cancer screening at various ages. This may include screening for:  Breast cancer.  Cervical cancer.  Colorectal  cancer.  Skin cancer.  Lung cancer. What should I know about heart disease, diabetes, and high blood pressure? Blood pressure and heart disease  High blood pressure causes heart disease and increases the risk of stroke. This is more likely to develop in people who have high blood pressure readings, are of African descent, or are overweight.  Have your blood pressure checked: ? Every 3-5 years if you are 57-49 years of age. ? Every year if you are 79 years old or older. Diabetes Have regular diabetes screenings. This checks your fasting blood sugar level. Have the screening done:  Once every three years after age 80 if you are at a normal weight and have a low risk for diabetes.  More often and at a younger age if you are overweight or have a high risk for diabetes. What should I know about preventing infection? Hepatitis B If you have a higher risk for hepatitis B, you should be screened for this virus. Talk with your health care provider to find out if you are at risk for hepatitis B infection. Hepatitis C Testing is recommended for:  Everyone born from [birth year] through 1964.  Anyone with known risk factors for hepatitis C. Sexually transmitted infections (STIs)  Get screened for STIs, including gonorrhea and chlamydia, if: ? You are sexually active and are younger than 49 years of age. ? You are older than 49 years of age and your health care provider tells you that you are at risk for this type  of infection. ? Your sexual activity has changed since you were last screened, and you are at increased risk for chlamydia or gonorrhea. Ask your health care provider if you are at risk.  Ask your health care provider about whether you are at high risk for HIV. Your health care provider may recommend a prescription medicine to help prevent HIV infection. If you choose to take medicine to prevent HIV, you should first get tested for HIV. You should then be tested every 3 months for as long as  you are taking the medicine. Pregnancy  If you are about to stop having your period (premenopausal) and you may become pregnant, seek counseling before you get pregnant.  Take 400 to 800 micrograms (mcg) of folic acid every day if you become pregnant.  Ask for birth control (contraception) if you want to prevent pregnancy. Osteoporosis and menopause Osteoporosis is a disease in which the bones lose minerals and strength with aging. This can result in bone fractures. If you are 52 years old or older, or if you are at risk for osteoporosis and fractures, ask your health care provider if you should:  Be screened for bone loss.  Take a calcium or vitamin D supplement to lower your risk of fractures.  Be given hormone replacement therapy (HRT) to treat symptoms of menopause. Follow these instructions at home: Lifestyle  Do not use any products that contain nicotine or tobacco, such as cigarettes, e-cigarettes, and chewing tobacco. If you need help quitting, ask your health care provider.  Do not use street drugs.  Do not share needles.  Ask your health care provider for help if you need support or information about quitting drugs. Alcohol use  Do not drink alcohol if: ? Your health care provider tells you not to drink. ? You are pregnant, may be pregnant, or are planning to become pregnant.  If you drink alcohol: ? Limit how much you use to 0-1 drink a day. ? Limit intake if you are breastfeeding.  Be aware of how much alcohol is in your drink. In the [place] one drink equals one 12 oz bottle of beer (355 mL), one 5 oz glass of wine (148 mL), or one 1 oz glass of hard liquor (44 mL). General instructions  Schedule regular health, dental, and eye exams.  Stay current with your vaccines.  Tell your health care provider if: ? You often feel depressed. ? You have ever been abused or do not feel safe at home. Summary  Adopting a healthy lifestyle and getting preventive care are  important in promoting health and wellness.  Follow your health care provider's instructions about healthy diet, exercising, and getting tested or screened for diseases.  Follow your health care provider's instructions on monitoring your cholesterol and blood pressure. This information is not intended to replace advice given to you by your health care provider. Make sure you discuss any questions you have with your health care provider. Document Revised: 03/21/2018 Document Reviewed: 03/21/2018 Elsevier Patient Education  2021 [employer].

## 2020-06-24 ENCOUNTER — Other Ambulatory Visit: Payer: Self-pay

## 2020-06-24 ENCOUNTER — Encounter: Payer: Self-pay | Admitting: Internal Medicine

## 2020-06-24 ENCOUNTER — Ambulatory Visit (INDEPENDENT_AMBULATORY_CARE_PROVIDER_SITE_OTHER): Payer: PRIVATE HEALTH INSURANCE | Admitting: Internal Medicine

## 2020-06-24 VITALS — BP 130/78 | HR 68 | Temp 98.2°F | Ht 65.0 in | Wt 372.0 lb

## 2020-06-24 DIAGNOSIS — E538 Deficiency of other specified B group vitamins: Secondary | ICD-10-CM

## 2020-06-24 DIAGNOSIS — Z0000 Encounter for general adult medical examination without abnormal findings: Secondary | ICD-10-CM | POA: Diagnosis not present

## 2020-06-24 DIAGNOSIS — E559 Vitamin D deficiency, unspecified: Secondary | ICD-10-CM

## 2020-06-24 DIAGNOSIS — I700 Atherosclerosis of aorta: Secondary | ICD-10-CM | POA: Diagnosis not present

## 2020-06-24 DIAGNOSIS — R7303 Prediabetes: Secondary | ICD-10-CM

## 2020-06-24 DIAGNOSIS — F411 Generalized anxiety disorder: Secondary | ICD-10-CM

## 2020-06-24 DIAGNOSIS — F419 Anxiety disorder, unspecified: Secondary | ICD-10-CM

## 2020-06-24 DIAGNOSIS — I10 Essential (primary) hypertension: Secondary | ICD-10-CM

## 2020-06-24 LAB — POCT GLYCOSYLATED HEMOGLOBIN (HGB A1C)
HbA1c POC (<> result, manual entry): 5.6 % (ref 4.0–5.6)
HbA1c, POC (controlled diabetic range): 5.6 % (ref 0.0–7.0)
HbA1c, POC (prediabetic range): 5.6 % — AB (ref 5.7–6.4)
Hemoglobin A1C: 5.6 % (ref 4.0–5.6)

## 2020-06-24 MED ORDER — CYANOCOBALAMIN 1000 MCG/ML IJ SOLN
1000.0000 ug | Freq: Once | INTRAMUSCULAR | Status: AC
  Administered 2020-06-24: 1000 ug via INTRAMUSCULAR

## 2020-06-24 MED ORDER — ALPRAZOLAM 0.5 MG PO TABS: 0.5000 mg | ORAL_TABLET | Freq: Three times a day (TID) | ORAL | 0 refills | Status: AC | PRN

## 2020-06-24 MED ORDER — PAROXETINE HCL 10 MG PO TABS: 10.0000 mg | ORAL_TABLET | Freq: Every day | ORAL | 3 refills | Status: AC

## 2020-06-24 MED ORDER — LOSARTAN POTASSIUM 25 MG PO TABS: 25.0000 mg | ORAL_TABLET | Freq: Every day | ORAL | 1 refills | Status: AC

## 2020-06-24 NOTE — Assessment & Plan Note (Signed)
Chronic Plans on establishing with [facility] weight center She is walking and will slowly build up how much she is walking Discussed avoiding snacking and some foods to avoid and not avoid Decreased portions She is motivated to continue her weight loss efforts

## 2020-06-24 NOTE — Assessment & Plan Note (Signed)
Chronic Lab Results  Component Value Date   HGBA1C 5.6 06/24/2020   HGBA1C 5.6 06/24/2020   HGBA1C 5.6 (A) 06/24/2020   HGBA1C 5.6 06/24/2020   Sugars well controlled and in the normal range

## 2020-06-24 NOTE — Assessment & Plan Note (Signed)
Chronic Anxiety is fairly controlled Continue Paxil 10 mg daily.  She could potentially use a higher dose, but would like to stick at this dose She has seen a therapist and has a natural ways of helping her anxiety Continue regular exercise

## 2020-06-24 NOTE — Assessment & Plan Note (Signed)
Chronic Continue vitamin D supplementation

## 2020-06-24 NOTE — Assessment & Plan Note (Signed)
Chronic Mild She is working on [employer] loss-stressed regular exercise and heart healthy diet Check lipid panel We will hold off on statin at this time

## 2020-06-24 NOTE — Assessment & Plan Note (Signed)
Chronic Continue B12 supplementation

## 2020-06-24 NOTE — Assessment & Plan Note (Addendum)
Chronic Blood pressure well controlled Continue losartan 25 mg daily, metoprolol 12.5 mg twice daily

## 2020-06-26 ENCOUNTER — Other Ambulatory Visit (INDEPENDENT_AMBULATORY_CARE_PROVIDER_SITE_OTHER): Payer: Self-pay | Admitting: Family Medicine

## 2020-06-26 DIAGNOSIS — E559 Vitamin D deficiency, unspecified: Secondary | ICD-10-CM

## 2020-06-29 NOTE — Telephone Encounter (Signed)
Refill request

## 2020-07-07 ENCOUNTER — Ambulatory Visit: Payer: BC Managed Care – PPO | Admitting: Psychology

## 2020-07-17 ENCOUNTER — Ambulatory Visit: Payer: Managed Care, Other (non HMO) | Admitting: Gastroenterology

## 2020-07-18 ENCOUNTER — Other Ambulatory Visit (INDEPENDENT_AMBULATORY_CARE_PROVIDER_SITE_OTHER): Payer: Self-pay | Admitting: Family Medicine

## 2020-07-18 DIAGNOSIS — E559 Vitamin D deficiency, unspecified: Secondary | ICD-10-CM

## 2020-07-21 ENCOUNTER — Ambulatory Visit: Payer: BC Managed Care – PPO | Admitting: Psychology

## 2020-07-24 ENCOUNTER — Other Ambulatory Visit (INDEPENDENT_AMBULATORY_CARE_PROVIDER_SITE_OTHER): Payer: Self-pay | Admitting: Family Medicine

## 2020-07-24 DIAGNOSIS — F411 Generalized anxiety disorder: Secondary | ICD-10-CM

## 2020-07-27 ENCOUNTER — Ambulatory Visit: Payer: PRIVATE HEALTH INSURANCE

## 2020-07-28 ENCOUNTER — Other Ambulatory Visit: Payer: Self-pay

## 2020-07-28 ENCOUNTER — Ambulatory Visit (INDEPENDENT_AMBULATORY_CARE_PROVIDER_SITE_OTHER)
Admission: RE | Admit: 2020-07-28 | Discharge: 2020-07-28 | Disposition: A | Discharge status: Home / Self Care | Payer: PRIVATE HEALTH INSURANCE | Source: Ambulatory Visit | Attending: Pulmonary Disease | Admitting: Pulmonary Disease

## 2020-07-28 DIAGNOSIS — R918 Other nonspecific abnormal finding of lung field: Secondary | ICD-10-CM | POA: Diagnosis not present

## 2020-07-30 ENCOUNTER — Ambulatory Visit (INDEPENDENT_AMBULATORY_CARE_PROVIDER_SITE_OTHER): Payer: PRIVATE HEALTH INSURANCE

## 2020-07-30 ENCOUNTER — Other Ambulatory Visit: Payer: Self-pay

## 2020-07-30 DIAGNOSIS — E538 Deficiency of other specified B group vitamins: Secondary | ICD-10-CM | POA: Diagnosis not present

## 2020-07-30 NOTE — Progress Notes (Signed)
Pt here for monthly B12 injection per Dr. Quay Burow  B12 1043mg given right deltoid IM, and pt tolerated injection well.

## 2020-08-06 ENCOUNTER — Ambulatory Visit: Payer: PRIVATE HEALTH INSURANCE

## 2020-08-12 ENCOUNTER — Telehealth: Payer: Self-pay | Admitting: Interventional Cardiology

## 2020-08-12 NOTE — Telephone Encounter (Signed)
Patient c/o Palpitations:  High priority if patient c/o lightheadedness, shortness of breath, or chest pain  1) How long have you had palpitations/irregular HR/ Afib? Are you having the symptoms now? It started yesterday and it lasted hours   2) Are you currently experiencing lightheadedness, SOB or CP? dizziness  3) Do you have a history of afib (atrial fibrillation) or irregular heart rhythm? She states she was diagnosed SVT  4) Have you checked your BP or HR? (document readings if available): 120/71                                                 142/68                                                  132/75  5) Are you experiencing any other symptoms?    Rrerencing her my chart message    9:44 PM Hey. I'm sending 2 different readings from my Apple Watch that keeps saying my ekg is inconclusive. I feel the palpitations and they have lasted for a few hours now. My heart rate remains normal but I feel like my heart is skipping. What should I do. I'm lot anxious and have not excercised. The only meds I have changed is I started over counter vitamin d3 2000 daily.  Attachments  [employer] ECG

## 2020-08-12 NOTE — Telephone Encounter (Signed)
Apple watch showed NSR with PVCs. No SVT or AFib. Continue to monitor.

## 2020-08-12 NOTE — Telephone Encounter (Signed)
RN spoke to patient regarding advise from Dr. Irish Lack. Patient states the episodes come and go, but she will continue to monitor her Heart rate during the events. RN advised patient to contact the office with any questions or concerns.

## 2020-08-12 NOTE — Telephone Encounter (Signed)
Spoke with the patient who states that on Monday she had some palpitations. She states that she had a similar episode about a year ago but it would last for short periods and go away. She states that Monday it lasted for several hours and would not go away. She states that she could feel her heart fluttering and at one point it felt like her heart was beating in her chest. She states that yesterday morning she woke up and felt fine, then later in the afternoon the palpitations came back and lasted about 45 minutes. She states that it feels different when she was found to be in SVT. She states that she is not having any chest pain, her SOB is at baseline. She states that she did have a little bit of lightheadedness this morning but has not felt like she is going to pass out. She states that blood pressure has been well controlled. Her heart rate during these episodes has been in the 70s-90. She is currently not having any palpitations or lightheadedness. She sent two MyChart messages with EKG readings from her Apple watch from when she was having these episodes. Advised patient that I will send Dr. Irish Lack a message for him to review.

## 2020-09-24 ENCOUNTER — Other Ambulatory Visit (INDEPENDENT_AMBULATORY_CARE_PROVIDER_SITE_OTHER): Payer: PRIVATE HEALTH INSURANCE

## 2020-09-24 ENCOUNTER — Ambulatory Visit: Payer: PRIVATE HEALTH INSURANCE | Admitting: Gastroenterology

## 2020-09-24 VITALS — BP 112/74 | HR 68 | Ht 65.0 in | Wt 377.4 lb

## 2020-09-24 DIAGNOSIS — Z1211 Encounter for screening for malignant neoplasm of colon: Secondary | ICD-10-CM

## 2020-09-24 DIAGNOSIS — K760 Fatty (change of) liver, not elsewhere classified: Secondary | ICD-10-CM | POA: Diagnosis not present

## 2020-09-24 DIAGNOSIS — Z6841 Body Mass Index (BMI) 40.0 and over, adult: Secondary | ICD-10-CM

## 2020-09-24 LAB — COMPREHENSIVE METABOLIC PANEL
ALT: 13 U/L (ref 0–35)
AST: 7 U/L (ref 0–37)
Albumin: 4.3 g/dL (ref 3.5–5.2)
Alkaline Phosphatase: 78 U/L (ref 39–117)
BUN: 19 mg/dL (ref 6–23)
CO2: 27 mEq/L (ref 19–32)
Calcium: 9.6 mg/dL (ref 8.4–10.5)
Chloride: 102 mEq/L (ref 96–112)
Creatinine, Ser: 0.89 mg/dL (ref 0.40–1.20)
GFR: 76.5 mL/min (ref >60.00)
Glucose, Bld: 92 mg/dL (ref 70–99)
Potassium: 4.2 mEq/L (ref 3.5–5.1)
Sodium: 137 mEq/L (ref 135–145)
Total Bilirubin: 0.3 mg/dL (ref 0.2–1.2)
Total Protein: 8.1 g/dL (ref 6.0–8.3)

## 2020-09-24 LAB — CBC WITH DIFFERENTIAL/PLATELET
Basophils Absolute: 0.1 10*3/uL (ref 0.0–0.1)
Basophils Relative: 1.3 % (ref 0.0–3.0)
Eosinophils Absolute: 0.1 10*3/uL (ref 0.0–0.7)
Eosinophils Relative: 0.8 % (ref 0.0–5.0)
HCT: 35.2 % — ABNORMAL LOW (ref 36.0–46.0)
Hemoglobin: 11.7 g/dL — ABNORMAL LOW (ref 12.0–15.0)
Lymphocytes Relative: 23.1 % (ref 12.0–46.0)
Lymphs Abs: 2.4 10*3/uL (ref 0.7–4.0)
MCHC: 33.2 g/dL (ref 30.0–36.0)
MCV: 88.2 fl (ref 78.0–100.0)
Monocytes Absolute: 0.5 10*3/uL (ref 0.1–1.0)
Monocytes Relative: 4.3 % (ref 3.0–12.0)
Neutro Abs: 7.5 10*3/uL (ref 1.4–7.7)
Neutrophils Relative %: 70.5 % (ref 43.0–77.0)
Platelets: 392 10*3/uL (ref 150.0–400.0)
RBC: 4 Mil/uL (ref 3.87–5.11)
RDW: 14.7 % (ref 11.5–15.5)
WBC: 10.6 10*3/uL — ABNORMAL HIGH (ref 4.0–10.5)

## 2020-09-24 NOTE — Patient Instructions (Signed)
It has been recommended to you by your physician that you have a(n) Colonoscopy at [facility] completed. Per your request, we did not schedule the procedure(s) today. Please contact our office at [phone] should you decide to have the procedure completed. You will be scheduled for a pre-visit and procedure at that time.   Take Vitamin E capsule daily  Follow Healthy Diet Plan  Due to recent changes in healthcare laws, you may see the results of your imaging and laboratory studies on MyChart before your provider has had a chance to review them.  We understand that in some cases there may be results that are confusing or concerning to you. Not all laboratory results come back in the same time frame and the provider may be waiting for multiple results in order to interpret others.  Please give [place] 48 hours in order for your provider to thoroughly review all the results before contacting the office for clarification of your results.    If you are age 70 or older, your body mass index should be between 23-30. Your Body mass index is 62.8 kg/m. If this is out of the aforementioned range listed, please consider follow up with your Primary Care Provider.  If you are age 64 or younger, your body mass index should be between 19-25. Your Body mass index is 62.8 kg/m. If this is out of the aformentioned range listed, please consider follow up with your Primary Care Provider.   __________________________________________________________  The [facility] providers would like to encourage you to use [facility] to communicate with providers for non-urgent requests or questions.  Due to long hold times on the telephone, sending your provider a message by [facility] may be a faster and more efficient way to get a response.  Please allow 48 business hours for a response.  Please remember that this is for non-urgent requests.    Thank you for choosing [facility]  Karleen Hampshire Nand[place]am,MD

## 2020-09-24 NOTE — Progress Notes (Signed)
[name]    [id number]    09/20/1970  Primary Care Physician:Eksir, Earnest Conroy, MD  Referring Physician: Binnie Rail, MD [place],  [place] [id number]   Chief complaint: Fatty Liver  HPI:  50 year old very pleasant female here for follow-up for fatty liver  She is no longer having abdominal pain, nausea or vomiting.  Her bowel habits are at baseline.  She is not having unintentional weight loss.    She canceled EGD and colonoscopy that was scheduled, is worried about potential complications with anesthesia and procedure.     Denies any rectal bleeding, melena, skin rash or joint pain.   No family history of GI malignancy.   Outpatient Encounter Medications as of 09/24/2020  Medication Sig   albuterol (VENTOLIN HFA) 108 (90 Base) MCG/ACT inhaler Inhale 2 puffs into the lungs every 6 (six) hours as needed for wheezing or shortness of breath.   ALPRAZolam (XANAX) 0.5 MG tablet Take 1 tablet (0.5 mg total) by mouth 3 (three) times daily as needed for anxiety.   ascorbic acid (VITAMIN C) 1000 MG tablet Take 1 tablet by mouth daily.   Cyanocobalamin (B-12 COMPLIANCE INJECTION) 1000 MCG/ML KIT B12  Monthly   Ferrous Sulfate (SLOW FE) 142 (45 Fe) MG TBCR Take 1 tablet by mouth every other day.   loratadine (CLARITIN) 10 MG tablet Take 10 mg by mouth in the morning.   losartan (COZAAR) 25 MG tablet Take 1 tablet (25 mg total) by mouth daily.   metoprolol tartrate (LOPRESSOR) 25 MG tablet Take 0.5 tablets (12.5 mg total) by mouth 2 (two) times daily.   NON FORMULARY CPAP at night   PARoxetine (PAXIL) 10 MG tablet Take 1 tablet (10 mg total) by mouth daily.   PRESCRIPTION MEDICATION CPAP- At bedtime and during any time of rest   valACYclovir (VALTREX) 1000 MG tablet TAKE 2 TABLETS EVERY 12 HOURS FOR 1 DAY START ASAP AFTER SYTPTOM ONSET   Vitamin D, Ergocalciferol, (DRISDOL) 1.25 MG (50000 UNIT) CAPS capsule Take 50,000 Units by mouth every 7  (seven) days.   [DISCONTINUED] Vitamin D, Ergocalciferol, (DRISDOL) 1.25 MG (50000 UNIT) CAPS capsule Take 1 capsule (50,000 Units total) by mouth every 7 (seven) days.   Facility-Administered Encounter Medications as of 09/24/2020  Medication   cyanocobalamin ((VITAMIN B-12)) injection 1,000 mcg    Allergies as of 09/24/2020 - Review Complete 09/24/2020  Allergen Reaction Noted   Doxycycline Other (See Comments) 10/25/2019   Tape Itching and Rash 11/16/2019    Past Medical History:  Diagnosis Date   Anxiety    ASTHMA    B12 deficiency    DELAYED GASTRIC EMPTYING    DEPRESSION    GERD    Glucose intolerance (impaired glucose tolerance) 04/09/2015   Hypertension    OBESITY    OBSTRUCTIVE SLEEP APNEA    POLYCYSTIC OVARIAN DISEASE    Prediabetes    SMOKER    SOB (shortness of breath)    SVT (supraventricular tachycardia) (HCC)    Tachycardia    Umbilical hernia    Umbilical hernia    Vitamin B12 deficiency 03/2015 dx   start IM replacement q 30d   Vitamin D deficiency 03/2015 dx    Past Surgical History:  Procedure Laterality Date   CESAREAN SECTION  97 & 98   x's 2   Cocxyl removal  1995   DILATION AND CURETTAGE OF UTERUS     LAPAROSCOPY  TUBAL LIGATION  1998    Family History  Problem Relation Age of Onset   Breast cancer Mother        dx 11/2009   Diabetes Mother    High blood pressure Mother    Thyroid disease Mother    Anxiety disorder Mother    Obesity Mother    Diabetes Maternal Aunt    Prostate cancer Maternal Grandfather 20       also hx colon polyps   Heart disease Paternal Grandmother    Pancreatic cancer Paternal Grandmother 72   High blood pressure Father    High Cholesterol Father    Depression Father    Heart murmur Sister    Pancreatic cancer Maternal Grandmother    Colon polyps Maternal Uncle    Adrenal disorder Neg Hx     Social History   Socioeconomic History   Marital status: Married    Spouse name: Twanda Stakes   Number  of children: 2   Years of education: Not on file   Highest education level: Not on file  Occupational History   Occupation: CNA    Employer:  [employer]    Comment: CNA  Tobacco Use   Smoking status: Former    Packs/day: 0.25    Years: 30.00    Pack years: 7.50    Types: Cigarettes    Start date: 10/23/1987    Quit date: 10/23/2017    Years since quitting: 2.9   Smokeless tobacco: Never  Vaping Use   Vaping Use: Never used  Substance and Sexual Activity   Alcohol use: Yes    Alcohol/week: 0.0 standard drinks    Comment: Rarely   Drug use: No   Sexual activity: Not on file  Other Topics Concern   Not on file  Social History Narrative   Lives with husband & 2 kids from 1st marriage. (1st husband expired 1998-leukemia) work as Merchandiser, retail, but currently unemployed   Social Determinants of Radio broadcast assistant Strain: Not on Art therapist Insecurity: Not on file  Transportation Needs: Not on file  Physical Activity: Not on file  Stress: Not on file  Social Connections: Not on file  Intimate Partner Violence: Not on file      Review of systems: All other review of systems negative except as mentioned in the HPI.   Physical Exam: Vitals:   09/24/20 1054  BP: 112/74  Pulse: 68  SpO2: 98%   Body mass index is 62.8 kg/m. Gen:      No acute distress, morbidly obese HEENT:  sclera anicteric Abd:      soft, non-tender; no palpable masses, no distension Ext:    No edema Neuro: alert and oriented x 3 Psych: normal mood and affect  Data Reviewed:  Reviewed labs, radiology imaging, old records and pertinent past GI work up   Assessment and Plan/Recommendations:  50 year old very pleasant female with history of morbid obesity, obstructive sleep apnea, hypertension, SVT here for follow-up visit for fatty liver   Fatty liver: Continue with dietary change and exercise Avoid high-fat and high carbohydrate diet Use vitamin E 400  international units daily Follow-up CMP  She is due for colorectal cancer screening based on current recommendation.  Patient is extremely reluctant to proceed with colonoscopy, advised her to call back when she wants to proceed with it. BMI greater than 50, will need to be scheduled at [facility] endoscopy unit    GERD :Protonix 40  mg daily Antireflux measures     Return in 6 months or sooner if needed   The patient was provided an opportunity to ask questions and all were answered. The patient agreed with the plan and demonstrated an understanding of the instructions.  Damaris Hippo , MD    CC: Binnie Rail, MD

## 2020-09-25 ENCOUNTER — Other Ambulatory Visit: Payer: Self-pay

## 2020-09-25 ENCOUNTER — Ambulatory Visit
Admission: RE | Admit: 2020-09-25 | Discharge: 2020-09-25 | Disposition: A | Discharge status: Home / Self Care | Payer: PRIVATE HEALTH INSURANCE | Source: Ambulatory Visit | Attending: Internal Medicine | Admitting: Internal Medicine

## 2020-09-25 DIAGNOSIS — Z1231 Encounter for screening mammogram for malignant neoplasm of breast: Secondary | ICD-10-CM

## 2020-10-03 ENCOUNTER — Other Ambulatory Visit: Payer: Self-pay | Admitting: Internal Medicine

## 2020-10-03 DIAGNOSIS — B001 Herpesviral vesicular dermatitis: Secondary | ICD-10-CM

## 2020-10-06 ENCOUNTER — Encounter: Payer: Self-pay | Admitting: Gastroenterology

## 2020-11-05 ENCOUNTER — Other Ambulatory Visit: Payer: Self-pay | Admitting: Family Medicine

## 2021-11-17 ENCOUNTER — Encounter (INDEPENDENT_AMBULATORY_CARE_PROVIDER_SITE_OTHER): Payer: Self-pay
